# Patient Record
Sex: Male | Born: 1935 | Race: White | Hispanic: No | Marital: Married | Smoking: Former smoker
Health system: Southern US, Community
[De-identification: ages and names within clinical notes are randomized; demographics above are authoritative.]

## PROBLEM LIST (undated history)

## (undated) DIAGNOSIS — S88911A Complete traumatic amputation of right lower leg, level unspecified, initial encounter: Secondary | ICD-10-CM

## (undated) DIAGNOSIS — Z8711 Personal history of peptic ulcer disease: Secondary | ICD-10-CM

## (undated) DIAGNOSIS — I252 Old myocardial infarction: Secondary | ICD-10-CM

## (undated) DIAGNOSIS — I5042 Chronic combined systolic (congestive) and diastolic (congestive) heart failure: Secondary | ICD-10-CM

## (undated) DIAGNOSIS — E785 Hyperlipidemia, unspecified: Secondary | ICD-10-CM

## (undated) DIAGNOSIS — I255 Ischemic cardiomyopathy: Secondary | ICD-10-CM

## (undated) DIAGNOSIS — I2510 Atherosclerotic heart disease of native coronary artery without angina pectoris: Secondary | ICD-10-CM

## (undated) DIAGNOSIS — Z8719 Personal history of other diseases of the digestive system: Secondary | ICD-10-CM

## (undated) DIAGNOSIS — K52831 Collagenous colitis: Secondary | ICD-10-CM

## (undated) HISTORY — PX: STOMACH SURGERY: SHX791

## (undated) HISTORY — PX: CARDIAC CATHETERIZATION: SHX172

## (undated) HISTORY — DX: Collagenous colitis: K52.831

## (undated) HISTORY — PX: BACK SURGERY: SHX140

## (undated) HISTORY — PX: TOTAL KNEE ARTHROPLASTY: SHX125

## (undated) HISTORY — DX: Complete traumatic amputation of right lower leg, level unspecified, initial encounter: S88.911A

## (undated) HISTORY — PX: LEG AMPUTATION: SHX1105

---

## 2004-05-17 ENCOUNTER — Inpatient Hospital Stay (HOSPITAL_COMMUNITY): Admission: EM | Admit: 2004-05-17 | Discharge: 2004-05-24 | Payer: Self-pay | Admitting: Emergency Medicine

## 2004-05-20 ENCOUNTER — Encounter (INDEPENDENT_AMBULATORY_CARE_PROVIDER_SITE_OTHER): Payer: Self-pay | Admitting: *Deleted

## 2004-06-11 ENCOUNTER — Inpatient Hospital Stay (HOSPITAL_COMMUNITY): Admission: RE | Admit: 2004-06-11 | Discharge: 2004-06-15 | Payer: Self-pay | Admitting: Orthopedic Surgery

## 2004-06-12 ENCOUNTER — Encounter (INDEPENDENT_AMBULATORY_CARE_PROVIDER_SITE_OTHER): Payer: Self-pay | Admitting: *Deleted

## 2004-08-14 ENCOUNTER — Encounter: Admission: RE | Admit: 2004-08-14 | Discharge: 2004-10-21 | Payer: Self-pay | Admitting: Orthopedic Surgery

## 2006-09-07 ENCOUNTER — Ambulatory Visit: Payer: Self-pay | Admitting: Gastroenterology

## 2006-09-11 ENCOUNTER — Ambulatory Visit: Payer: Self-pay | Admitting: Gastroenterology

## 2006-09-29 ENCOUNTER — Ambulatory Visit: Payer: Self-pay | Admitting: Gastroenterology

## 2010-01-28 ENCOUNTER — Inpatient Hospital Stay (HOSPITAL_COMMUNITY): Admission: RE | Admit: 2010-01-28 | Discharge: 2010-01-31 | Payer: Self-pay | Admitting: Orthopedic Surgery

## 2010-04-19 NOTE — Procedures (Signed)
Summary: Gastroenterology colon  Gastroenterology colon   Imported By: Donneta Romberg Endo Tech 07/24/2007 13:45:57  _____________________________________________________________________  External Attachment:    Type:   Image     Comment:   External Document

## 2010-05-28 LAB — COMPREHENSIVE METABOLIC PANEL
ALT: 26 U/L (ref 0–53)
AST: 25 U/L (ref 0–37)
Albumin: 3.6 g/dL (ref 3.5–5.2)
Alkaline Phosphatase: 74 U/L (ref 39–117)
BUN: 12 mg/dL (ref 6–23)
CO2: 27 mEq/L (ref 19–32)
Calcium: 9.3 mg/dL (ref 8.4–10.5)
Chloride: 106 mEq/L (ref 96–112)
Creatinine, Ser: 0.99 mg/dL (ref 0.4–1.5)
GFR calc Af Amer: >60 mL/min (ref >60)
GFR calc non Af Amer: >60 mL/min (ref >60)
Glucose, Bld: 78 mg/dL (ref 70–99)
Potassium: 4.6 mEq/L (ref 3.5–5.1)
Sodium: 141 mEq/L (ref 135–145)
Total Bilirubin: 0.4 mg/dL (ref 0.3–1.2)
Total Protein: 6.8 g/dL (ref 6.0–8.3)

## 2010-05-28 LAB — SURGICAL PCR SCREEN
MRSA, PCR: NEGATIVE
Staphylococcus aureus: NEGATIVE

## 2010-05-28 LAB — CBC
HCT: 28.2 % — ABNORMAL LOW (ref 39.0–52.0)
HCT: 28.5 % — ABNORMAL LOW (ref 39.0–52.0)
HCT: 30 % — ABNORMAL LOW (ref 39.0–52.0)
HCT: 41.8 % (ref 39.0–52.0)
Hemoglobin: 10.2 g/dL — ABNORMAL LOW (ref 13.0–17.0)
Hemoglobin: 13.9 g/dL (ref 13.0–17.0)
Hemoglobin: 9.6 g/dL — ABNORMAL LOW (ref 13.0–17.0)
Hemoglobin: 9.7 g/dL — ABNORMAL LOW (ref 13.0–17.0)
MCH: 30.2 pg (ref 26.0–34.0)
MCH: 30.7 pg (ref 26.0–34.0)
MCH: 30.9 pg (ref 26.0–34.0)
MCH: 31 pg (ref 26.0–34.0)
MCHC: 33.4 g/dL (ref 30.0–36.0)
MCHC: 33.7 g/dL (ref 30.0–36.0)
MCHC: 34 g/dL (ref 30.0–36.0)
MCHC: 34.6 g/dL (ref 30.0–36.0)
MCV: 89.6 fL (ref 78.0–100.0)
MCV: 90.6 fL (ref 78.0–100.0)
MCV: 90.7 fL (ref 78.0–100.0)
MCV: 91.1 fL (ref 78.0–100.0)
Platelets: 204 10*3/uL (ref 150–400)
Platelets: 213 10*3/uL (ref 150–400)
Platelets: 231 10*3/uL (ref 150–400)
Platelets: 312 10*3/uL (ref 150–400)
RBC: 3.13 MIL/uL — ABNORMAL LOW (ref 4.22–5.81)
RBC: 3.15 MIL/uL — ABNORMAL LOW (ref 4.22–5.81)
RBC: 3.3 MIL/uL — ABNORMAL LOW (ref 4.22–5.81)
RBC: 4.62 MIL/uL (ref 4.22–5.81)
RDW: 13.9 % (ref 11.5–15.5)
RDW: 14 % (ref 11.5–15.5)
RDW: 14.3 % (ref 11.5–15.5)
RDW: 14.4 % (ref 11.5–15.5)
WBC: 7.7 10*3/uL (ref 4.0–10.5)
WBC: 7.7 10*3/uL (ref 4.0–10.5)
WBC: 8.8 10*3/uL (ref 4.0–10.5)
WBC: 9.1 10*3/uL (ref 4.0–10.5)

## 2010-05-28 LAB — BASIC METABOLIC PANEL
BUN: 6 mg/dL (ref 6–23)
BUN: 9 mg/dL (ref 6–23)
CO2: 28 mEq/L (ref 19–32)
CO2: 29 mEq/L (ref 19–32)
Calcium: 7.9 mg/dL — ABNORMAL LOW (ref 8.4–10.5)
Calcium: 8.2 mg/dL — ABNORMAL LOW (ref 8.4–10.5)
Chloride: 101 mEq/L (ref 96–112)
Chloride: 105 mEq/L (ref 96–112)
Creatinine, Ser: 0.91 mg/dL (ref 0.4–1.5)
Creatinine, Ser: 0.92 mg/dL (ref 0.4–1.5)
GFR calc Af Amer: >60 mL/min (ref >60)
GFR calc Af Amer: >60 mL/min (ref >60)
GFR calc non Af Amer: >60 mL/min (ref >60)
GFR calc non Af Amer: >60 mL/min (ref >60)
Glucose, Bld: 125 mg/dL — ABNORMAL HIGH (ref 70–99)
Glucose, Bld: 131 mg/dL — ABNORMAL HIGH (ref 70–99)
Potassium: 4 mEq/L (ref 3.5–5.1)
Potassium: 4 mEq/L (ref 3.5–5.1)
Sodium: 135 mEq/L (ref 135–145)
Sodium: 137 mEq/L (ref 135–145)

## 2010-05-28 LAB — URINALYSIS, ROUTINE W REFLEX MICROSCOPIC
Bilirubin Urine: NEGATIVE
Glucose, UA: NEGATIVE mg/dL
Hgb urine dipstick: NEGATIVE
Ketones, ur: NEGATIVE mg/dL
Nitrite: NEGATIVE
Protein, ur: NEGATIVE mg/dL
Specific Gravity, Urine: 1.008 (ref 1.005–1.030)
Urobilinogen, UA: 0.2 mg/dL (ref 0.0–1.0)
pH: 6.5 (ref 5.0–8.0)

## 2010-05-28 LAB — PROTIME-INR
INR: 1.02 (ref 0.00–1.49)
INR: 1.19 (ref 0.00–1.49)
INR: 1.5 — ABNORMAL HIGH (ref 0.00–1.49)
INR: 3.46 — ABNORMAL HIGH (ref 0.00–1.49)
Prothrombin Time: 13.6 seconds (ref 11.6–15.2)
Prothrombin Time: 15.3 seconds — ABNORMAL HIGH (ref 11.6–15.2)
Prothrombin Time: 18.3 seconds — ABNORMAL HIGH (ref 11.6–15.2)
Prothrombin Time: 34.8 seconds — ABNORMAL HIGH (ref 11.6–15.2)

## 2010-05-28 LAB — TYPE AND SCREEN
ABO/RH(D): O POS
Antibody Screen: NEGATIVE

## 2010-05-28 LAB — ABO/RH: ABO/RH(D): O POS

## 2010-05-28 LAB — APTT: aPTT: 37 seconds (ref 24–37)

## 2010-07-30 NOTE — Assessment & Plan Note (Signed)
[facility]                         GASTROENTEROLOGY OFFICE NOTE   JENSON, BEEDLE                        MRN:          [id number]  DATE:09/07/2006                            DOB:          08-Feb-1932    RECOMMENDATIONS:  Heme-positive stool.   HISTORY OF PRESENT ILLNESS:  [name] is a pleasant, 77 year old, white  male referred through the courtesy of Dr. Christell Constant for evaluation. Routine  testing demonstrated heme-positive stool. On June 30, 2006, hemoglobin  was 11.5 and MCV was 77. His serum iron was 23 and iron saturation 60%.  [name] has no GI complaints including change of bowel habits,  abdominal pain, melena or hematochezia. He has found no gastric  irritants including nonsteroidals. He is status post subtotal  gastrectomy over 40 years ago for peptic ulcer disease. Colonoscopy and  upper endoscopy in 2004 were unremarkable except for diverticulosis and  hemorrhoids. [name] does give blood regularly. His last contribution  was in December 2007.   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   MEDICATIONS:  Baby aspirin. He is allergic to NAPROSYN.   He does not smoke, he drinks rarely. He is married and retired.   REVIEW OF SYSTEMS:  Reviewed and is negative.   PHYSICAL EXAMINATION:  VITAL SIGNS:  Pulse 80, blood pressure 122/74,  weight 171.  HEENT: EOMI. PERRLA. Sclerae are anicteric.  Conjunctivae are pink.  NECK:  Supple without thyromegaly, adenopathy or carotid bruits.  CHEST:  Clear to auscultation and percussion without adventitious  sounds.  CARDIAC:  Regular rhythm; normal S1 S2.  There are no murmurs, gallops  or rubs.  ABDOMEN:  Bowel sounds are normoactive.  Abdomen is soft, non-tender and  non-distended.  There are no abdominal masses, tenderness, splenic  enlargement or hepatomegaly.  EXTREMITIES:  Full range of motion.  No cyanosis, clubbing or edema.  RECTAL:  Deferred.   IMPRESSION:  Iron deficiency anemia and heme-positive stool. Chronic  gastrointestinal blood loss sources must be ruled out including polyps,  AVMs and neoplasm. He could be malabsorbing iron though this would not  account for his heme positive stool.   RECOMMENDATION:  1. Colonoscopy. If negative, I will proceed with upper endoscopy.  2. To consider capsule endoscopy if the above tests are negative.     Barbette Hair. Arlyce Dice, MD,FACG  Electronically Signed    RDK/MedQ  DD: 09/07/2006  DT: 09/07/2006  Job #: [id number]   cc:   Ernestina Penna, M.D.

## 2010-07-30 NOTE — Letter (Signed)
September 07, 2006    Ernestina Penna, M.D.  [address] [place], [place] [address]   RE:  [name], Eugene  MRN:  [id number]  /  DOB:  04-28-1931   Dear Dr. Christell Constant:   Upon your kind referral, I had the pleasure of evaluating your patient  and I am pleased to offer my findings.  I saw Gregery Walberg in the office  today.  Enclosed is a copy of my progress note that details my findings  and recommendations.   Thank you for the opportunity to participate in your patient's care.    Sincerely,      Barbette Hair. Arlyce Dice, MD,FACG  Electronically Signed    RDK/MedQ  DD: 09/07/2006  DT: 09/07/2006  Job #: [id number]

## 2010-08-02 NOTE — Consult Note (Signed)
NAME:  Eugene, [name]                 ACCOUNT NO.:  [id number]   MEDICAL RECORD NO.:  [id number]          [name] TYPE:  INP   LOCATION:  5041                         FACILITY:  [facility]   PHYSICIAN:  Mobolaji B. Bakare, M.D.DATE OF BIRTH:  1931-11-04   DATE OF CONSULTATION:  05/18/2004  DATE OF DISCHARGE:                                   CONSULTATION   PRIMARY CARE PHYSICIAN:  Dr. Pamalee Leyden [facility]   REASON FOR CONSULTATION:  Desaturation and low pO2 on arterial blood gas.   HISTORY OF PRESENT ILLNESS:  Eugene [name] is a pleasant Caucasian male who had  an unfortunate accident yesterday, whereby he sustained injury to Eugene right  foot, which may eventually require amputation. He was going well until about  6:00 p.m. today. While sleeping, Eugene [name] was snoring and it was  difficult for him to be aroused. Eugene nurse checked his oxygen saturation and  it was in Eugene low 80s. Eventually when they got him aroused, he felt  somewhat short of breath but there was no chest pain. He had some  diaphoresis according to Eugene daughter. No palpitation. There is no cough and  no fever. He was given Narcan 0.1 mg intravenously and thereafter, he had an  episode of vomiting. Vomitus was ingested food. No hematemesis. Apparently,  Eugene [name] had taken a total of 8 mg of morphine in 3 hours. Eugene [name] is  currently awake, saturating at 100% on 2 liters. He denies any chest pain.  Has not had cardiac problems in Eugene past. He is no longer short of breath.   REVIEW OF SYSTEMS:  No fever, no chills. No abdominal pain and no rash.   PAST MEDICAL HISTORY:  1.  Hyperlipidemia for which he is using fish oil  2.  Bleeding duodenal ulcer several years ago for which he had surgery.   CURRENT MEDICATIONS:  Cefazolin 1 gram IV q.8h., Colace 100 mg p.o. b.i.d.,  Lovenox 40 mg subcutaneously q.24h., Pepcid 20 mg b.i.d., morphine sulfate  PCA, IV fluid normal saline at 100 mL per hour, Tylenol p.r.n.,  Benadryl  p.r.n., Robaxin p.r.n., Zofran p.r.n., Percocet p.r.n., Senokot p.r.n.   HOME MEDICATIONS:  Includes 1 aspirin 81 mg q. day, fish oil.   ALLERGIES:  No known drug allergies.   SOCIAL HISTORY:  Eugene [name] occasionally drinks alcohol. He quit smoking  about 25 years ago.   FAMILY HISTORY:  Significant for diabetes mellitus in some of his siblings.  He has got supportive children. Daughter's living in [place].   PHYSICAL EXAMINATION:  VITAL SIGNS:  Temperature 99.7, pulse rate of 107,  blood pressure 112/71, respiratory rate 18. O2 saturation 100% on 2 liters.  GENERAL:  Eugene [name] is not dyspneic.  He appears not pale.  HEENT:  No conjunctival hemorrhage.  Normocephalic, atraumatic head.  No  elevated JVD. No cervical lymphadenopathy.  Ears and oropharynx, moist  mucosa. Hemorrhage on soft palate.  LUNGS:  Clear clinically to auscultation. No wheeze, no rhonchi, no  crackles.  CARDIOVASCULAR:  S1 and S2. Regular tachycardia.  ABDOMEN:  Nondistended, soft. Midline incision scar. No palpable  hepatosplenomegaly. No positive hepatojugular reflux. Bowel sounds present.  EXTREMITIES:  Right lower extremity in a cast. He can wiggle his toes. No  pedal edema.  SKIN:  No petechia. He has bruise on Eugene left forearm from Eugene accident.   LABORATORY DATA:  ABG on room air drawn immediately after Eugene episode, pH of  7.31, pCO2 of 50, pO2 of 52.5, bicarb of 22.4. O2 saturation of 83%. Sodium  132, potassium 4.9, chloride 103, bicarb 28, BUN 6, creatinine 1.1, glucose  205, calcium 7.6. Calcium previously was 8.7 and albumin 3.7. CK 674, CK MB  20.7. Troponin 0.05. CK relative index of 3.1. White cell 9.4. Hemoglobin  and hematocrit 8.9 and 26. Platelets 202,000. Glucose 205. Please note that  previous hemoglobin was 13.6 and hematocrit was 29.3.   Chest x-ray showed moderate right vascular atelectasis and also scarring,  cardiomegaly.   ASSESSMENT/PLAN:  1.  Eugene [name] is a  77 year old Caucasian male with acute shortness of      breath, desaturation, apneic hypoxic respiratory failure pattern on      arterial blood gases. He has elevated troponin and CK MB. This acute      episode is most likely related to poor inspiratory effort on Eugene      background of narcotic analgesic, although ABG is not a typical      pulmonary embolism pattern, given this setting. It would be prudent to      rule out pulmonary embolism/fat embolism, though less likely.  2.  Elevated troponin and CK MB probably from Eugene trauma. Will further cycle      cardiac enzymes.  3.  Eugene [name] is currently saturating at 100% on 2 liters and he is      asymptomatic.   RECOMMENDATIONS:  1.  Give incentive spirometer. Use every 1 to 2 hours when awake.  2.  Obtain CT chest angiogram to rule out pulmonary embolism/fat embolism.  3.  Repeat cardiac panel q. 8 x2.  4.  Check BNP, albumin.  5.  Check CBC in a.m.  6.  Other problems are anemia, hyperglycemia, hyponatremia, hypocalcemia,      right foot injury.  7.  Guaiac stool x2.  8.  Check fasting blood sugar.      MBB/MEDQ  D:  05/18/2004  T:  05/18/2004  Job:  [id number]   cc:   Ernestina Penna, M.D.  [address] [place]  [place] [address]  Fax: [phone]   Vanita Panda. Magnus Ivan, M.D.  Fax: [phone]   Doralee Albino. Carola Frost, M.D.  Fax: [phone]

## 2010-08-02 NOTE — Consult Note (Signed)
NAME:  JEANMARC, VIERNES NO.:  [id number]   MEDICAL RECORD NO.:  [id number]          PATIENT TYPE:  INP   LOCATION:  1827                         FACILITY:  [facility]   PHYSICIAN:  Doralee Albino. Carola Frost, M.D. DATE OF BIRTH:  [birth year]   DATE OF CONSULTATION:  05/17/2004  DATE OF DISCHARGE:                                   CONSULTATION   CONSULTING PHYSICIAN:  Doralee Albino. Carola Frost, M.D.   REQUESTING PHYSICIAN:  Lorre Nick, M.D.   REASON FOR CONSULTATION:  Right foot crush.   BRIEF HISTORY OF PRESENTATION:  The patient is a 77 year old, otherwise  healthy, white male who was cutting down a tree when he fell and crushed his  right foot.  He reports severe pain and loss of plantar sensation.  At the  time of evaluation, he is somewhat somnolent from all the narcotics he has  received.   MEDICATIONS:  None.   ALLERGIES:  None.   PAST MEDICAL HISTORY:  1.  The patient has had back surgery x 2.  2.  He has also had right knee arthroscopic surgery.   REVIEW OF SYSTEMS:  Negative for recent fever, chills, nausea, vomiting, or  chest pain.   SOCIAL HISTORY:  The patient presents with his wife and daughter.   PHYSICAL EXAMINATION:  GENERAL:  He is not in any acute distress.  MUSCULOSKELETAL:  He does not have any ecchymosis, swelling, or tenderness  of the shoulders bilaterally, elbows bilaterally, or the wrists bilaterally.  Examination of the pelvis revealed it to be nontender.  The right knee is  nontender and there is no effusion or abrasions or crepitus.  The right  ankle has been wrapped in a splint.  There is bluish tinting of his toes on  the right side.  Cap refill is greater than 7 seconds.  He does have intact  superficial peroneal nerve sensation.  Deep peroneal is equivocal.  Posterior tibial sensation along the plantar aspect of the foot is absent.  He can gently flex and extend his toes but only weakly.  Dorsalis pedis  pulse is palpable at the ankle or  just below the ankle level.  HEART:  Regular rate and rhythm without murmurs.  LUNGS:  Clear to auscultation bilaterally.  ABDOMEN:  Soft, nontender, nondistended.  EXTREMITIES:  His contralateral left lower extremity is nontender with no  focal ecchymosis, swelling, or crepitus.   X-rays demonstrate multiple fractures involving the right foot with  disruption of the mid foot medially and laterally, a fracture of the first  metatarsal base, a fracture of the third and fourth metatarsal shafts toward  the neck and extensive comminuted calcaneus.  There does appear to be air  within the soft tissues.   ASSESSMENT:  Right foot crush injury with multiple bony injuries and loss of  plantar sensation with associated vascular compromise.   We suspect this injury is not able to be reconstructed and result in very  poor function for the patient.  It may be that Mr. Skalla will most benefit  from a primary amputation but we will  proceed with aggressive irrigation and  debridement to reduce his infection risk while allowing the patient and the  family to grapple with the choice before them.  We will also obtain a CT  scan immediately to better define the nature of the injury.  I did have an  extensive discussion with the patient and the family regarding limb salvage  versus amputation and also discussed with them the plan of surgery in  detail.  After discussion of its risks and benefits, they elected to  proceed.      MHH/MEDQ  D:  05/17/2004  T:  05/18/2004  Job:  [id number]

## 2010-08-02 NOTE — Discharge Summary (Signed)
NAME:  JABER, DUNLOW NO.:  [id number]   MEDICAL RECORD NO.:  [id number]          PATIENT TYPE:  INP   LOCATION:  5016                         FACILITY:  [facility]   PHYSICIAN:  Nadara Mustard, MD     DATE OF BIRTH:  1931/11/15   DATE OF ADMISSION:  06/11/2004  DATE OF DISCHARGE:  06/15/2004                                 DISCHARGE SUMMARY   DIAGNOSIS:  Ischemic necrosis of right foot.   PROCEDURES:  Irrigation and debridement of skin, soft tissue and bone, right  foot.   DISPOSITION:  Discharged to home in stable condition. Plan to followup in  the office in two weeks. Discharged with [facility] to follow up  with home health physical therapy.   HISTORY OF PRESENT ILLNESS:  The patient is a 77 year old gentleman with  ischemic necrosis of the right foot. He has failed conservative treatment in  the office and presents at this time for surgical intervention. The patient  was brought to the OR on June 11, 2004, with ischemic necrosis of the right  foot.   HOSPITAL COURSE:  The patient initially underwent irrigation and debridement  of skin and soft tissue. Despite decompression of the abscess and debridment  of necrotic tissue the patient's foot failed to improve and he was brought  back to the OR for a below knee amputation. He was treated with Kefzol for  infection prophylaxis.  The patient proceeded with below-the-knee amputation  on June 12, 2004. His pain was controlled with PCA pain pump. On  postoperative day #1 from his BKA, the patient was seen by physical therapy  for progressive ambulation, nonweightbearing on the right. He was discharged  to home when safe with physical therapy. He was discharged to home in stable  condition on June 15, 2004, status post right BKA with home health physical  therapy. Prescription for Tylox for pain. Plan to follow up in one week  after discharge.       MVD/MEDQ  D:  08/07/2004  T:  08/07/2004  Job:   [id number]

## 2010-08-02 NOTE — Discharge Summary (Signed)
NAME:  Eugene, [name] NO.:  [id number]   MEDICAL RECORD NO.:  [id number]          PATIENT TYPE:  INP   LOCATION:  5012                         FACILITY:  [facility]   PHYSICIAN:  Nadara Mustard, MD     DATE OF BIRTH:  [birth year]   DATE OF ADMISSION:  05/17/2004  DATE OF DISCHARGE:  05/24/2004                                 DISCHARGE SUMMARY   DIAGNOSIS:  Massive degloving injury; crush to his right foot secondary to a  tree falling on his leg.   POSTOPERATIVE DIAGNOSES:  Massive degloving injury; crush to his right foot  secondary to a tree falling on his leg.   PROCEDURE:  Multiple irrigation debridement.  Percutaneous pin fixation for  the fracture dislocation of the mid foot.   CONDITION:  Discharged to home in stable condition.   FOLLOW-UP:  To follow up in the office one week after discharge.   HISTORY OF PRESENT ILLNESS:  The patient is a 77 year old gentleman who was  cutting down a tree, when the tree fell and crushed his right foot. The  patient had an open fracture dislocation of the right foot, with a complete  degloving of the hind foot heel pad.   HOSPITAL COURSE:  The patient was initially evaluated by Dr. Carola Frost, and  underwent initial urgent irrigation debridement on May 17, 2004.  Postoperatively the patient's foot wound underwent further irrigation  debridement and also underwent placement of a wound vac on May 19, 2004;  with a previous percutaneous pin fixation of the unstable fracture. The  patient's wound progressed well. The heel flap appeared viable and the  patient was scheduled for repeat irrigation debridement.  He was set up for  wound care with [facility] and home health physical therapy, with  anticipated discharge to home.   On May 22, 2004 the dressing was changed; the heel pad was viable. There  was good turgor.  The foot was warm. There was some clear serous drainage  from the fracture blisters. The patient's foot  continued to appear viable  healthy and he was discharged to home on May 24, 2004, status post a  fracture dislocation of the right mid foot with massive degloving injury.   DISPOSITION:  He was given a prescription for Tylox for pain.  Nitroglycerin  patches were used to improve his microcirculatory perfusion.  He was set up  with home health RN for three times a week dressing changes. We will  change the dressing to Xeroform dressing changes.  His Lovenox was  discontinued and he was started on aspirin once a day.   PLAN:  To follow-up in the office in one week.      MVD/MEDQ  D:  07/24/2004  T:  07/24/2004  Job:  [id number]

## 2010-08-02 NOTE — H&P (Signed)
NAME:  Eugene [name], Eugene [name] NO.:  [id number]   MEDICAL RECORD NO.:  [id number]          PATIENT TYPE:  INP   LOCATION:  2550                         FACILITY:  [facility]   PHYSICIAN:  Nadara Mustard, MD     DATE OF BIRTH:  06-19-1931   DATE OF ADMISSION:  06/11/2004  DATE OF DISCHARGE:                                HISTORY & PHYSICAL   HISTORY OF PRESENT ILLNESS:  The patient is a 77 year old gentleman status  post multitrauma to his right foot for which he has undergone percutaneous  pin fixation as well as serial irrigation and debridement and foot salvage.  The patient has ischemic ulceration and presents at this time for  debridement of the foot.   Temperature 98.5, heart rate 102, respiratory rate 16, blood pressure  152/98, height 70 inches, weight 176 pounds.   ALLERGIES:  No known drug allergies.   MEDICATIONS:  Percocet, Advil, aspirin, Colace, Restoril, and Keflex.   PAST MEDICAL HISTORY:  1.  Diskectomy in 1995.  2.  Crushed right foot in early March 2006 with right knee arthroscopy.   REVIEW OF SYSTEMS:  Negative for cardiac problems. He had a cardiac  catheterization on May 20, 2004. PULMONARY: No problems. NEUROLOGIC: The  patient has had a nitroglycerin patch on his foot to try to improve wound  salvage. GI: History of  peptic ulcer disease.   PHYSICAL EXAMINATION:  On examination of the right foot he has a black  eschar which covers the entire plantar aspect of his foot including the  hindfoot and forefoot. Dorsally there is also black eschar. The patient has  good capillary refill in the toes. The foot is stable.   ASSESSMENT:  Ischemic and chronic eschar, right foot.   PLAN:  The patient is admitted at this time for irrigation and debridement  of his foot with potential placement of Integra graft skin plus wound vac.  The risks and benefits of surgery were discussed including infection,  neurovascular injury, persistent wound breakdown, and the  need for potential  amputation. The patient states he understands and wishes to proceed at this  time.      MVD/MEDQ  D:  06/11/2004  T:  06/11/2004  Job:  [id number]

## 2010-08-02 NOTE — Op Note (Signed)
NAME:  Eugene [name], Eugene [name] NO.:  [id number]   MEDICAL RECORD NO.:  [id number]          PATIENT TYPE:  INP   LOCATION:  5041                         FACILITY:  [facility]   PHYSICIAN:  Elana Alm. Thurston Hole, M.D. DATE OF BIRTH:  1931-09-20   DATE OF PROCEDURE:  05/19/2004  DATE OF DISCHARGE:                                 OPERATIVE REPORT   PREOPERATIVE DIAGNOSIS:  Right foot open fracture dislocation with multiple  fractures and soft tissue injury.   POSTOPERATIVE DIAGNOSIS:  Right foot open fracture dislocation with multiple  fractures and soft tissue injury.   PROCEDURE:  Irrigation and debridement of right foot.  Placement of wound  VAC to right foot.   SURGEON:  Elana Alm. Thurston Hole, M.D.   ASSISTANT:  Nadara Mustard, M.D. and Cecil Cranker, PA   ANESTHESIA:  General.   OPERATIVE TIME:  45 minutes.   COMPLICATIONS:  None.   DESCRIPTION OF PROCEDURE:  [name] was brought to the operating room on  May 19, 2004.  Prior to surgery, he was found to have slight ST depressions  on his EKG and a slightly elevated troponin of 0.05.  I spoke to Meade Maw, M.D. of cardiology prior to surgery who recommended that we proceed  to surgery and that she would see [name] postoperatively and decide what  if any cardiac workup would be necessary with no significant cardiac  history.   Mr. Bills was brought to the operating room and placed on the operating  table in the supine position.  After an adequate level of general anesthesia  was obtained, his dressings were removed from his right foot.  There was  found to be satisfactory capillary refill to his toes.  There was a palpable  dorsalis pedis pulse and a very strong Doppler posterior tibial pulse.  The  pins which had been previously placed two days ago by Doralee Albino. Handy, M.D.  were in satisfactory position.  Right leg was then prepped using Betadine  and draped using sterile technique.  I took down four of the  previous  tacking sutures and elevated part of the flap for irrigation purposes.  I  did not take down the whole flap in order to not devascularize the flap.  There was slight amount of superficial necrotic tissue which I debrided, but  no gross contamination was noted.  This wound was then irrigated with saline  and then I resutured loosely the flap edges with 2-0 nylon suture.  After  this was done, then a wound VAC was placed on the wound and  sterile dressings were applied and the patient then had a femoral nerve  block placed by anesthesia for postoperative pain control.  He was then  awakened, extubated, and taken to the recovery room in stable condition.  Needle, sponge, and instrument counts correct x2 at the end of the case.      Robert A. Thurston Hole, M.D.  Electronically Signed     RAW/MEDQ  D:  05/19/2004  T:  05/20/2004  Job:  [id number]

## 2010-08-02 NOTE — Consult Note (Signed)
NAME:  Eugene [name], Eugene [name] NO.:  [id number]   MEDICAL RECORD NO.:  [id number]          PATIENT TYPE:  INP   LOCATION:  5041                         FACILITY:  [facility]   PHYSICIAN:  Meade Maw, M.D.    DATE OF BIRTH:  December 15, 1931   DATE OF CONSULTATION:  DATE OF DISCHARGE:                                   CONSULTATION   CONTINUATION:   PHYSICAL EXAMINATION:  GENERAL:  Physical exam reveals an elderly male in no  acute distress.  He is recently status post irrigation.  VITAL SIGNS:  Blood pressure is 146/64, heart rate is 104, O2 SAT is 95% on  2 L.  HEENT:  Unremarkable.  NECK:  There is no neck vein distention noted.  There are no carotid bruits  noted.  PULMONARY:  Exam reveals breath sounds which are equal and clear to  auscultation, no use of accessory muscles.  CARDIOVASCULAR:  Exam reveals a normal S1 and S2, regular rate and rhythm,  no audible murmurs are noted.  PMI is not displaced.  ABDOMEN:  Abdomen is soft, benign, nontender.  No unusual bruits or  pulsations are noted.  Midline incision scar is noted.  EXTREMITIES:  The extremities reveal the right leg in a cast.  There is no  peripheral edema noted.  SKIN:  Warm and dry.  NEUROLOGIC:  Nonfocal.   LABORATORY DATA:  Serial CKs as noted above.  White count is 7.7, hemoglobin  is 8.5, hematocrit is 24.6, admission hematocrit of 39.3, platelet count at  197,000.  Sodium is 135, potassium is 4.2, chloride is 103, CO2 is 27, BUN  is 0.4, his creatinine is 1.0, albumin is 2.7, calcium 7.8.   ECG on May 17, 2004 revealed a sinus rhythm, normal ECG, no ST changes.  ECG from May 15, 2004 revealed sinus tachycardia with nonspecific ST  changes.  ECG today reveals a sinus rhythm with no significant ECG changes.   CT scan of the chest was performed for further evaluation of pulmonary  embolus; there was no pulmonary embolus noted.  The patient was noted to  have bilateral small pleural effusions.   IMPRESSION:  1.  Positive cardiac enzymes.  There has been only a marginal elevation in      his relative index and this may be more related to the crush injury of      his right foot.  His troponin I is marginally elevated as well; however,      in view of the hypoxia, nonspecific ST changes and marginal elevation in      enzymes, it is prudent to proceed with left heart catheterization for      further evaluation.  This has been discussed with Dr. Thurston Hole.  He feels      there is no contraindication to anticoagulation.  We will also initiate      aspirin and heparin per cardiology protocol.  2.  Dyslipidemia.  We will obtain a fasting lipid profile for further      evaluation.  3.  Hypoxia most likely related to his morphine.  He has had no recurrence.      We will continue to monitor at this time.   Thank you for allowing me to participate in the care of your patient.  I  will discuss the patient further with you.      HP/MEDQ  D:  05/19/2004  T:  05/20/2004  Job:  [id number]   cc:   Molly Maduro A. Thurston Hole, M.D.  [address]  [place] [address]  Fax: [phone]   Doralee Albino. Carola Frost, M.D.  Fax: [phone]

## 2010-08-02 NOTE — Cardiovascular Report (Signed)
NAME:  Eugene [name], Eugene [name] NO.:  [id number]   MEDICAL RECORD NO.:  [id number]          PATIENT TYPE:  INP   LOCATION:  2907                         FACILITY:  [facility]   PHYSICIAN:  Meade Maw, M.D.    DATE OF BIRTH:  10-21-1931   DATE OF PROCEDURE:  DATE OF DISCHARGE:                              CARDIAC CATHETERIZATION   INDICATIONS FOR PROCEDURE:  Positive cardiac enzymes, dyspnea, nonspecific  ST changes noted on ECG.   PROCEDURE:  After obtaining written informed consent the patient was brought  to the cardiac catheterization lab in a post absorptive state.  Preop  sedation was achieved using Versed 1 mg IV.  The left groin was prepped and  draped in the usual sterile fashion, local anesthesia was achieved using 1%  Xylocaine.  A 6 French hemostasis sheath was placed into the left femoral  artery using a modified Seldinger technique.  The left femoral artery was  elected in view of the patients crush injury to his right foot.  Selective  coronary angiography was performed using the JL4, JR4 Judkins catheter,  multiple views were obtained, all catheters were exchanged with made over  guidewire.  A single plain ventriculogram was performed in the RAO position  using a 6 French pigtail curved catheter . There was no identifiable  coronary artery disease.  The patient was transferred to the holding area.  Hemostasis sheath was removed.  Hemostasis was achieved using a FemoStop  device and there was no immediate complications.   FINDING:  The aortic pressure was 128/67, LV pressure was 120/11 with an EDP  of 13.  Single plain ventriculogram revealed normal wall motion ejection  fraction 65-70%.   Coronary angiography.  The left main coronary artery bifurcates into the  left anterior descending and circumflex vessel.  There was no significant  disease noted in the left main coronary artery.   Left anterior descending:  The left anterior descending gives rise to a  small to moderate D1, large D2, goes on in as an apical branch.  There is  tortuosity of the mid vessel with a branch point netted as well.  This was  reviewed in several procedures.  This was not felt to be related to coronary  artery disease.   Circumflex:  The circumflex vessel gives rise to a small OM1, moderate OM2  and a large OM3.  There was no disease noted in the circumflex or its  branches.   Right coronary artery:  The right coronary artery is a large dominant artery  giving rise to two RV marginal PDA.  There is no disease noted in the right  coronary artery or its branches.   FINAL IMPRESSION:  1.  Normal coronary angiography.  2.  Normal single plain ventriculogram.  3.  Elevation in CK-MB is related to crush injury.  No further cardiac      workup is indicated at this time.      HP/MEDQ  D:  05/20/2004  T:  05/20/2004  Job:  [id number]

## 2010-08-02 NOTE — Op Note (Signed)
NAME:  Eugene [name], Eugene [name] NO.:  [id number]   MEDICAL RECORD NO.:  [id number]          PATIENT TYPE:  INP   LOCATION:  5016                         FACILITY:  [facility]   PHYSICIAN:  Nadara Mustard, MD     DATE OF BIRTH:  1931-10-23   DATE OF PROCEDURE:  06/12/2004  DATE OF DISCHARGE:                                 OPERATIVE REPORT   PREOP DIAGNOSIS:  Ischemic necrotic right foot.   POSTOP DIAGNOSIS:  Ischemic necrotic right foot.   PROCEDURE:  Right below-the-knee amputation.   SURGEON:  Nadara Mustard, M.D.   ANESTHESIA:  General.   ESTIMATED BLOOD LOSS:  Minimal.   ANTIBIOTICS:  1 gram of Kefzol preoperatively.   TOURNIQUET TIME:  9 minutes at 300 mmHg.   DISPOSITION:  To PACU in stable condition.   INDICATIONS FOR PROCEDURE:  The patient is a 77 year old gentleman status  post trauma to his right foot. The patient has undergone multiple surgical  procedures for foot salvage, however he has had progressive ischemic  necrosis of the plantar aspect of the foot and presents at this time for  right below-the-knee amputation. The risks and benefits of surgery were  discussed including infection, neurovascular injury, nonhealing wound, need  for higher-level amputation. The patient and family state they understand  and wish to proceed at this time.   DESCRIPTION OF PROCEDURE:  The patient was brought to OR room 4 an underwent  general anesthetic. After adequate level of anesthesia obtained, the  patient's right lower extremity was prepped using DuraPrep and draped in a  sterile field and the right foot was draped into a impervious stockinette.  The leg was elevated. The tourniquet inflated to 300 mmHg at the thigh. A  transverse incision was made 10 cm distal to the tibial tubercle. This  curved proximally and a large posterior flap was created. The tibia was  beveled and transected 1 cm proximal to the skin and the fibula was  transected 1 cm proximal to the  tibia. A large posterior flap was created.  The sciatic nerve was pulled, cut and allowed to retract. The vascular  bundles were suture ligated x3 each. The tourniquet was deflated  after 9 minutes. The deep and superficial fascial layers were closed using  #1 PDS. The skin was closed using 2-0 Vicryl. Skin was closed using  approximating staples. Wound was covered with Adaptic, orthopedic sponges,  ABD dressing, Webril, Kerlix and Coban. The patient was extubated, taken to  PACU in stable condition.      MVD/MEDQ  D:  06/12/2004  T:  06/13/2004  Job:  [id number]

## 2010-08-02 NOTE — Consult Note (Signed)
NAME:  KEJON, FEILD NO.:  [id number]   MEDICAL RECORD NO.:  [id number]          PATIENT TYPE:  INP   LOCATION:  5041                         FACILITY:  [facility]   PHYSICIAN:  Meade Maw, M.D.    DATE OF BIRTH:  1931-06-21   DATE OF CONSULTATION:  DATE OF DISCHARGE:                                   CONSULTATION   REFERRING PHYSICIAN:  Dr. Elana Alm. Wainer.   INDICATION FOR CONSULT:  Positive cardiac enzymes, new ECG changes.   HISTORY:  Eugene [name] is a very pleasant 77 year old gentleman who was  involved in an accident in which he sustained injury to the right foot on  May 17, 2004.  It is felt that the foot may be unsalvageable, but it has  been elected to proceed with aggressive irrigation and debridement.  On  May 18, 2004, the patient was found to be asleep with labored breathing.  The daughter was at his bedside.  The patient was noted to have O2  desaturations with O2 SATS in the 80%; this was felt to be secondary to the  morphine he was given prior and the patient was given Narcan 0.1 mg  intravenously and subsequently had emesis.  ECG was performed; this revealed  a sinus tachycardia with nonspecific ST changes.  Serial cardiac enzymes  were obtained; his initial CK was 674 with an MB of 20.7; his relative index  was marginally elevated at 3.1; his troponin I was 0.05; his BNP was 78.  His second set of enzymes revealed a CK of 607 with an MB of 17.2 with a  relative index of 2.8.  Damoney has had no chest pain.  He has had no other  episodes of dyspnea.  He was very active prior to this presentation.  He  states that he walked on a full Bruce protocol, walked for a total of 9  minutes, there was no inducible ischemia noted.  He has had no past medical  history of orthopnea, pedal edema, tachyarrhythmia.  His coronary risk  factors are significant for male sex, age, __________ significant family  history, brothers with myocardial infarctions in their  later years; he has  also been treated for dyslipidemia.   PAST MEDICAL HISTORY:  Past medical history significant for:  1.  Duodenal ulcer requiring surgery several years ago.  2.  Hyperlipidemia.   CURRENT MEDICATIONS:  1.  Pepcid 10 mg p.o. b.i.d.  2.  Lovenox 40 mg subcu q.24 h.  3.  Morphine p.r.n.  4.  Ancef.  5.  Kefzol IV q.8 h.  6.  Zofran q.6 h. p.r.n.  7.  Percocet p.r.n.  8.  Tylenol p.r.n.  9.  Robaxin p.r.n.  10. Benadryl p.r.n.  11. Reglan p.r.n.   ALLERGIES:  He has had no known drug allergies.   SOCIAL HISTORY:  The patient is married, stopped smoking 25 years ago,  occasional alcohol.   FAMILY HISTORY:  Family history is significant for diabetes mellitus.   PHYSICAL EXAMINATION:  GENERAL:  Physical exam reveals an elderly male in no  acute  distress, recently status post irrigation of his right foot.   DICTATION ENDED AT THIS POINT.      HP/MEDQ  D:  05/19/2004  T:  05/20/2004  Job:  [id number]   cc:   Molly Maduro A. Thurston Hole, M.D.  [address]  [place] [address]  Fax: [phone]   Doralee Albino. Carola Frost, M.D.  Fax: [phone]   Vanita Panda. Magnus Ivan, M.D.  Fax: [phone]

## 2010-08-02 NOTE — Op Note (Signed)
NAME:  Eugene [name], Eugene [name] NO.:  [id number]   MEDICAL RECORD NO.:  [id number]          PATIENT TYPE:  INP   LOCATION:  5016                         FACILITY:  [facility]   PHYSICIAN:  Nadara Mustard, MD     DATE OF BIRTH:  26-Oct-1931   DATE OF PROCEDURE:  06/11/2004  DATE OF DISCHARGE:                                 OPERATIVE REPORT   PREOPERATIVE DIAGNOSIS:  Ischemic necrotic right foot.   POSTOPERATIVE DIAGNOSIS:  Ischemic necrotic right foot.   PROCEDURE:  Irrigation debridement skin, soft tissue and bone.   SURGEON:  Nadara Mustard, MD   ANESTHESIA:  General LMA.   ESTIMATED BLOOD LOSS:  Minimal.   ANTIBIOTICS:  1 gram of Kefzol.   TOURNIQUET TIME:  None.   DRAINS:  None.   COMPLICATIONS:  None.   DISPOSITION:  To PACU in stable condition.   INDICATIONS FOR PROCEDURE:  The patient is a 77 year old gentleman who is  status post trauma to his right foot for which he sustained a fracture  dislocation open across the mid foot from a tree falling on his foot. The  patient underwent serial irrigation debridement as well as percutaneous pin  fixation and presents at this time for irrigation and debridement of the  necrotic skin with a potential application of Apligraf and a wound vac.  Risks and benefits were discussed including infection, neurovascular injury,  nonviable foot, need for amputation. The patient states he understands and  wished proceed at this time.   DESCRIPTION OF PROCEDURE:  The patient was brought to OR room 4 an underwent  a general LMA anesthetic. After adequate level of anesthesia obtained, the  patient's right lower extremity was prepped using DuraPrep and draped in a  sterile field. The dorsal aspect of the foot had a very thin black eschar  layer which was debrided. There was good viable bleeding tissue with beefy  granulation tissue. The plantar aspect had extensive necrosis, necrosis of  the heel pad with necrosis of the calcaneus  as well as necrosis of the bones  of the mid foot. The patient had extensive necrosis of the plantar muscles.  These were debrided back to bleeding viable tissue and there was still  necrotic bone and an unstable mid foot. The patient had an essentially  nonsalvageable foot and it was after  cleansing and debriding, there was no necrotic tissue and the wound was  dressed with 4x4, ABDs, Kerlix and Coban. Anticipate we will need to bring  the patient back for a below-the-knee amputation. The patient was extubated,  taken to PACU in stable condition.      MVD/MEDQ  D:  06/11/2004  T:  06/12/2004  Job:  [id number]

## 2010-08-02 NOTE — Op Note (Signed)
NAME:  Eugene [name], Eugene [name] NO.:  [id number]   MEDICAL RECORD NO.:  [id number]          PATIENT TYPE:  INP   LOCATION:  1827                         FACILITY:  [facility]   PHYSICIAN:  Doralee Albino. Carola Frost, M.D. DATE OF BIRTH:  Apr 22, 1931   DATE OF PROCEDURE:  05/17/2004  DATE OF DISCHARGE:                                 OPERATIVE REPORT   PREOPERATIVE DIAGNOSIS:  Right foot crush and near amputation.   POSTOPERATIVE DIAGNOSIS:  Right foot crush and near amputation.   OPERATIVE PROCEDURE:  1.  Irrigation and debridement of multiple right foot fractures including      skin, muscle, fascia, and bone  2.  Open treatment and pinning of talotarsal joint fracture dislocations  3.  Open treatment and pinning of tarsometatarsal joint/ Lisfranc fracture      dislocations   SURGEON:  Doralee Albino. Carola Frost, M.D.   ASSISTANT:  Cecil Cranker, P.A.   ANESTHESIA:  General anesthesia.   FINDINGS:  1.  Severely comminuted open fracture dislocation of the Lisfranc joint  2.  Comminuted open fracture dislocation of the naviculocuneiform joints.  3.  Open severely comminuted calcaneus fracture  4.  Heel pad avulsion.  5.  3rd and 4th metatarsal shaft fractures  6.  Disruption of the posterior tibial, anterior tibial and extensor      hallucis longus tendons.  7.  Disruption of the dorsalis pedis artery and deep peroneal nerve at the      level of the Lisfranc joint.   COMPLICATIONS:  None.   ESTIMATED BLOOD LOSS:  40 mL, but probably another 150 was on his dressings  which was removed in the operating room.   DISPOSITION:  To PACU.   CONDITION:  Stable.   INDICATIONS FOR PROCEDURE:  The patient is a 77 year old white male,  otherwise healthy retiree, who was cutting down a tree when it fell on his  foot and crushed it.  He was subsequently evaluated for further treatment in  the [facility] ER.  Plain films and CAT scan revealed extensive bone injury  and ligamentous injury.   CT scan also demonstrated extensive air within the  soft tissues up to the level of the talus and throughout the mid- and  forefoot.  After discussion of the risks and benefits of surgery, the  patient and his family wished to proceed with irrigation and debridement.   After obtaining the CT scan and prior to surgery, I had another discussion  with them regarding the extent of his injuries and the expected need for  amputation given the complexity of the issues confronting him, including the  loss of plantar sensation, the decreased vascularity to his forefoot, the  extensively comminuted and open fracture dislocation of the midfoot and the  extensively comminuted calcaneus fracture with an avulsed heel pad.  Given  those factors, he would be best served by amputation in order to regain  function more quickly and to have more functional use of that extremity.  However, I thought that it was best to proceed with irrigation and  debridement until the  patient and his family could deal with the emotional  and psychological impact of amputation in addition to the physical problem  confronting them.  I also shared with them multiple pictures obtained from  the operating room and again, appealed to their sense of their husband's and  father's desires with regard to limb salvage versus primary amputation.  As  there was clearly no consensus, again I recommended that we proceed with  irrigation and debridement and perform a delayed amputation when and if  necessary.  The family understood the risks of delay to include possible  infection as well as pain and increased hospital stay and secondary  procedures.   DESCRIPTION OF PROCEDURE:  [name] underwent a thorough prep and drape of  his right lower extremity after obtaining the pictures described above.  There was extensive soft tissue injury, and his entire hindfoot and midfoot  and forefoot had the palpable sensation of a bag of bones.  His  heel pad  could be displaced entirely to the lateral side of his foot, exposing his  entire calcaneus, which again was extensively comminuted.  All of the free  fragments were removed.  The medial aspect of the navicular was completely  avulsed.  There was some dirt contamination but in general, the wound  appeared surprisingly clean of gross contaminants.  The anterior tib and  posterior tib disruptions were identified.  This avascular tissue was  trimmed and the completely dislocated intermediate cuneiform was also  examined and irrigated.  Two 3-L bags of pulsatile saline were then used to  irrigate his wound in addition to curets for the bony surfaces.  Two 62 K-  wires were then placed through the medial and lateral columns to reduce and  hold the midfoot on the medial and lateral aspects.  No fixation was placed  into the heel pad.  A deep drain was placed and then the skin closed with  far-near-near-far sutures.  A fluffy dressing was applied and then a  posterior splint.  During the case, the patient's toes, which initially were  quite blue, appeared to pink and then turned more ashen toward the  completion of the case again.  There was no pulse palpable in the posterior  tibial artery, but again the posterior tibial artery and nerve did appear to  be in continuity.  The dorsalis pedis artery was avulsed at the level of the  tarsal metatarsal joint.   PROGNOSIS:  Mr. Rabalais's prognosis with regard to this foot is grave.  It has  undergone multiple injuries which in and of themselves are relative  indications for amputation, including the extensively comminuted hindfoot  fracture with associated soft tissue injury, the avulsed heel pad, the loss  of plantar sensation and the open extensively comminuted fracture  dislocation of the midfoot.  Given the combination of these factors, this foot will not be very functional, and the patient would probably best be  served by an amputation.   We will try to contact the prosthetic group to see  if they would have an amputee discuss the course of this procedure and  recovery with the patient to better inform him to make his own decision  regarding amputation versus limb salvage.  Should he develop any signs or  symptoms of sepsis, we will proceed immediately with amputation.  He will  remain on perioperative antibiotics as well as DVT prophylaxis.  I discussed  all of these findings with Dr. Thurston Hole, who also evaluated  the patient's foot  intraoperatively as well as Dr. Magnus Ivan, who evaluated and examined the  patient preoperatively.  Please see their notes for their opinions regarding  Mr. Mcallister's injury.   Plan will be to return him to the OR in two days for repeat irrigation and  debridement at the very least.      MHH/MEDQ  D:  05/17/2004  T:  05/19/2004  Job:  [id number]

## 2011-10-16 ENCOUNTER — Ambulatory Visit: Payer: Medicare Other | Attending: Family Medicine | Admitting: Physical Therapy

## 2011-10-16 DIAGNOSIS — IMO0001 Reserved for inherently not codable concepts without codable children: Secondary | ICD-10-CM | POA: Insufficient documentation

## 2011-10-16 DIAGNOSIS — R269 Unspecified abnormalities of gait and mobility: Secondary | ICD-10-CM | POA: Insufficient documentation

## 2011-10-16 DIAGNOSIS — R5381 Other malaise: Secondary | ICD-10-CM | POA: Insufficient documentation

## 2011-10-16 DIAGNOSIS — S88119A Complete traumatic amputation at level between knee and ankle, unspecified lower leg, initial encounter: Secondary | ICD-10-CM | POA: Insufficient documentation

## 2012-04-04 ENCOUNTER — Emergency Department (HOSPITAL_COMMUNITY): Payer: Medicare Other

## 2012-04-04 ENCOUNTER — Encounter (HOSPITAL_COMMUNITY): Payer: Self-pay

## 2012-04-04 ENCOUNTER — Emergency Department (HOSPITAL_COMMUNITY)
Admission: EM | Admit: 2012-04-04 | Discharge: 2012-04-04 | Disposition: A | Discharge status: Home / Self Care | Payer: Medicare Other | Attending: Emergency Medicine | Admitting: Emergency Medicine

## 2012-04-04 DIAGNOSIS — M19039 Primary osteoarthritis, unspecified wrist: Secondary | ICD-10-CM | POA: Insufficient documentation

## 2012-04-04 DIAGNOSIS — Y9289 Other specified places as the place of occurrence of the external cause: Secondary | ICD-10-CM | POA: Insufficient documentation

## 2012-04-04 DIAGNOSIS — Z8719 Personal history of other diseases of the digestive system: Secondary | ICD-10-CM | POA: Insufficient documentation

## 2012-04-04 DIAGNOSIS — M19031 Primary osteoarthritis, right wrist: Secondary | ICD-10-CM

## 2012-04-04 DIAGNOSIS — W108XXA Fall (on) (from) other stairs and steps, initial encounter: Secondary | ICD-10-CM | POA: Insufficient documentation

## 2012-04-04 DIAGNOSIS — Y9301 Activity, walking, marching and hiking: Secondary | ICD-10-CM | POA: Insufficient documentation

## 2012-04-04 DIAGNOSIS — S63509A Unspecified sprain of unspecified wrist, initial encounter: Secondary | ICD-10-CM

## 2012-04-04 HISTORY — DX: Personal history of other diseases of the digestive system: Z87.19

## 2012-04-04 HISTORY — DX: Personal history of peptic ulcer disease: Z87.11

## 2012-04-04 MED ORDER — TRAMADOL HCL 50 MG PO TABS: 50.0000 mg | ORAL_TABLET | Freq: Four times a day (QID) | ORAL | Status: DC | PRN

## 2012-04-04 NOTE — ED Notes (Signed)
Pt alert and oriented x4. Respirations even and unlabored, bilateral symmetrical rise and fall of chest. Skin warm and dry. In no acute distress. Denies needs.   

## 2012-04-04 NOTE — ED Provider Notes (Signed)
History     CSN: [id number]  Arrival date & time 04/04/12  4098   First MD Initiated Contact with Patient 04/04/12 [phone]      Chief Complaint  Patient presents with  . Fall  . Hand Injury    (Consider location/radiation/quality/duration/timing/severity/associated sxs/prior treatment) HPI Comments: Eugene [name] is a 79 y.o. Male who injured his right wrist yesterday. He had been walking down steps carrying a piece of wood, and took a misstep, which caused him to fall. He feels that is secondary to his gait disorder, being an amputee with a prosthesis. He has isolated injury to the right wrist. No headache, neck pain, back pain. He has been using Advil with only partial relief of pain in his right wrist. The pain is exacerbated by movement, and palpation. There are no other modifying factors  Patient is a 79 y.o. male presenting with fall and hand injury. The history is provided by the patient.  Fall  Hand Injury     Past Medical History  Diagnosis Date  . History of stomach ulcers     Past Surgical History  Procedure Date  . Total knee arthroplasty   . Leg amputation   . Back surgery   . Stomach surgery     History reviewed. No pertinent family history.  History  Substance Use Topics  . Smoking status: Never Smoker   . Smokeless tobacco: Never Used  . Alcohol Use: Yes     Comment: occasional      Review of Systems  All other systems reviewed and are negative.    Allergies  Review of patient's allergies indicates not on file.  Home Medications   Current Outpatient Rx  Name  Route  Sig  Dispense  Refill  . TRAMADOL HCL 50 MG PO TABS   Oral   Take 1 tablet (50 mg total) by mouth every 6 (six) hours as needed for pain.   30 tablet   0     BP 148/75  Pulse 99  Temp 98 F (36.7 C)  Resp 18  SpO2 99%  Physical Exam  Nursing note and vitals reviewed. Constitutional: He is oriented to person, place, and time. He appears well-developed and  well-nourished. No distress.  HENT:  Head: Normocephalic and atraumatic.  Right Ear: External ear normal.  Left Ear: External ear normal.  Eyes: Conjunctivae normal and EOM are normal. Pupils are equal, round, and reactive to light.  Neck: Normal range of motion and phonation normal. Neck supple.  Cardiovascular: Normal rate and intact distal pulses.   Pulmonary/Chest: Effort normal. He exhibits no bony tenderness.  Abdominal: Normal appearance.  Musculoskeletal:       Left wrist, slightly swollen and tender. Decreased active range of motion right wrist, secondary to pain. Neurovascular intact distally in the right fingers. Normal range of motion, right hand and fingers  Neurological: He is alert and oriented to person, place, and time. He has normal strength. No cranial nerve deficit or sensory deficit. He exhibits normal muscle tone. Coordination normal.  Skin: Skin is warm, dry and intact.  Psychiatric: He has a normal mood and affect. His behavior is normal. Judgment and thought content normal.    ED Course  Procedures (including critical care time)  Labs Reviewed - No data to display Dg Wrist Complete Right  04/04/2012  *RADIOLOGY REPORT*  Clinical Data: Fall last night with lateral wrist pain and swelling.  RIGHT WRIST - COMPLETE 3+ VIEW  Comparison: None.  Findings:  Mild osteopenia.  Degenerative irregularity at the base the first metacarpal.  Calcification in the triangular fibrocartilage.  Scaphoid intact.  IMPRESSION: Degenerative change, without acute osseous finding.  Calcification in the triangular fibrocartilage, suggesting calcium pyrophosphate deposition disease.   Original Report Authenticated By: Jeronimo Greaves, M.D.    Nursing notes, applicable records and vitals reviewed.  Radiologic Images/Reports reviewed.   1. Wrist sprain   2. Degenerative joint disease of right wrist       MDM  Mechanical fall, with isolated right wrist injury. Incidental, arthritis noted in  right wrist. Treated and improved in ED. He is stable for discharge     Plan: Home Medications-  tramadol; Home Treatments- wrist splint, for comfort elevate; Recommended follow up- PCP in one week prn     Flint Melter, MD 04/04/12 1114

## 2012-04-04 NOTE — ED Notes (Signed)
Patient repor.ts that he was going up steps and slipped on the step landing on his right hand and right buttocks. Right hand slightly edematous.

## 2012-04-04 NOTE — ED Notes (Signed)
Pt escorted to discharge window. Pt verbalized understanding discharge instructions. In no acute distress.  

## 2012-06-03 ENCOUNTER — Ambulatory Visit (INDEPENDENT_AMBULATORY_CARE_PROVIDER_SITE_OTHER): Payer: Medicare Other | Admitting: Family Medicine

## 2012-06-03 ENCOUNTER — Telehealth: Payer: Self-pay | Admitting: Family Medicine

## 2012-06-03 ENCOUNTER — Encounter: Payer: Self-pay | Admitting: Family Medicine

## 2012-06-03 VITALS — BP 110/80 | HR 145 | Temp 97.6°F | Wt 165.0 lb

## 2012-06-03 DIAGNOSIS — R000 Tachycardia, unspecified: Secondary | ICD-10-CM

## 2012-06-03 DIAGNOSIS — R197 Diarrhea, unspecified: Secondary | ICD-10-CM

## 2012-06-03 DIAGNOSIS — I498 Other specified cardiac arrhythmias: Secondary | ICD-10-CM

## 2012-06-03 DIAGNOSIS — F411 Generalized anxiety disorder: Secondary | ICD-10-CM

## 2012-06-03 MED ORDER — ALPRAZOLAM 0.5 MG PO TABS: 0.5000 mg | ORAL_TABLET | Freq: Three times a day (TID) | ORAL | Status: DC | PRN

## 2012-06-03 MED ORDER — METOPROLOL SUCCINATE ER 25 MG PO TB24: 25.0000 mg | ORAL_TABLET | Freq: Every day | ORAL | Status: DC

## 2012-06-03 NOTE — Telephone Encounter (Signed)
Called patient and told him to walk in.

## 2012-06-03 NOTE — Telephone Encounter (Signed)
PT C/O SEVERE DIARRHEA, ANXIOUS, BP UP WHEN AT DENTIST TODAY 160/120. INSISTS ON SEEING YOU TODAY. CALL SOMETHING IN

## 2012-06-03 NOTE — Progress Notes (Signed)
Subjective:     Patient ID: Eugene [name], male   DOB: [birth year], 79 y.o.   MRN: [id number]  HPI Patient is a very pleasant 79 year old man who I asked to come in after talking with him on the telephone this afternoon.  He developed diarrhea yesterday.  The diarrhea persisted into this morning even after taking Imodium.  He went to the dentist today for his teeth cleaning and was found to have a blood pressure ranging from 150-160/100-110.  This made him very nervous and very upset and was to come in today to discuss.  He has never had hypertension.  He denies any palpitations, presyncope, or feeling his heart racing.  On arrival today he is found to be tachycardic at 145 in sinus rhythm.  He denies any chest pain or shortness of breath.  However he is visibly anxious and actually tremulous in the room as he talks.    Apparently his wife when I found this morning with with their children.  He apparently cut a tree in their yard.  This had him very upset because he found out the tree was actually viable he was cutting it because he thought was rotten.  He is very upset about losing the tree in his yard.  I think this coupled with diarrhea and coupled with the elevated blood pressure at the dentist triggered a mild anxiety attack.  He states he feels very nervous.   Review of Systems  Constitutional: Negative.   HENT: Negative for ear pain, congestion, facial swelling, rhinorrhea, neck pain, postnasal drip and tinnitus.   Eyes: Negative.   Respiratory: Negative for apnea, cough, choking, chest tightness, shortness of breath, wheezing and stridor.   Cardiovascular: Negative for chest pain and palpitations.  Gastrointestinal: Positive for diarrhea. Negative for nausea, vomiting, abdominal pain, constipation and abdominal distention.  Psychiatric/Behavioral: Positive for agitation.       Objective:   Physical Exam  Constitutional: He appears distressed.  HENT:  Head: Normocephalic and atraumatic.   Right Ear: External ear normal.  Left Ear: External ear normal.  Eyes: Conjunctivae and EOM are normal. Pupils are equal, round, and reactive to light.  Neck: Normal range of motion. Neck supple. No mass and no thyromegaly present.  Cardiovascular: Regular rhythm and normal heart sounds.  Tachycardia present.   Pulmonary/Chest: Effort normal. No accessory muscle usage. Not tachypneic. No respiratory distress.  Abdominal: Soft. Normal appearance and bowel sounds are normal. There is no hepatosplenomegaly. There is no tenderness.   patient is very scared and upset.  His hands are shaking as he speaks to me.  His mucous membranes are moist and there is no evidence of dehydration.    EKG shows sinus tachycardia at 143 beats per minute is a slight left axis of -10 there is no signs of ischemia or infarction.   After talking to me for approximately 25 minutes his heart rate has slowed down to 110 beats per minute. Assessment:     Diarrhea-viral gastroenteritis Sinus tachycardia Anxiety attack    Plan:     Having then the patient for years I really feel like he is having an anxiety attack.  I told him to use Imodium for the diarrhea as directed on the bottle.  I told him to be a brat diet and to push fluids.  On my wanting to go home and rest.  Takes Xanax 0.5 mg one by mouth every 8 hours when necessary anxiety and Toprol-XL 25 mg by mouth x1  tonight.  He is to followup with me in the clinic in the morning or go to the emergency room tonight if worse.  If tachycardia persists I were to cause a sinus tachycardia including possible hyperthyroidism

## 2012-06-04 ENCOUNTER — Ambulatory Visit (INDEPENDENT_AMBULATORY_CARE_PROVIDER_SITE_OTHER): Payer: Medicare Other | Admitting: Family Medicine

## 2012-06-04 ENCOUNTER — Encounter: Payer: Self-pay | Admitting: Family Medicine

## 2012-06-04 VITALS — BP 140/70 | HR 78 | Temp 98.4°F | Resp 16

## 2012-06-04 DIAGNOSIS — R000 Tachycardia, unspecified: Secondary | ICD-10-CM | POA: Insufficient documentation

## 2012-06-04 DIAGNOSIS — I498 Other specified cardiac arrhythmias: Secondary | ICD-10-CM

## 2012-06-04 DIAGNOSIS — F411 Generalized anxiety disorder: Secondary | ICD-10-CM

## 2012-06-04 DIAGNOSIS — R197 Diarrhea, unspecified: Secondary | ICD-10-CM

## 2012-06-04 MED ORDER — DIPHENOXYLATE-ATROPINE 2.5-0.025 MG PO TABS: 1.0000 | ORAL_TABLET | Freq: Four times a day (QID) | ORAL | Status: DC | PRN

## 2012-06-04 MED ORDER — ALPRAZOLAM 0.25 MG PO TABS: 0.2500 mg | ORAL_TABLET | Freq: Two times a day (BID) | ORAL | Status: DC | PRN

## 2012-06-04 NOTE — Progress Notes (Signed)
Subjective:     Patient ID: Eugene [name], male   DOB: 1931/05/24, 79 y.o.   MRN: [id number]  HPI Patient's followup from yesterday.  He is much less anxious.  Heart rate has slowed tremendously on the Toprol.  He continues to have crampy abdominal pain and diarrhea.  Imodium is not helping.  He denies any nausea or vomiting.  He denies symptoms of bowel obstruction.    Review of Systems  Constitutional: Negative.   HENT: Negative.   Respiratory: Negative.   Cardiovascular: Negative.   Gastrointestinal: Positive for diarrhea (crampy abd pain).       Objective:   Physical Exam  Constitutional: He appears well-developed and well-nourished.  HENT:  Head: Normocephalic.  Neck: Normal range of motion. Neck supple.  Cardiovascular: Normal rate, regular rhythm and normal heart sounds.   Pulmonary/Chest: Effort normal and breath sounds normal.  Abdominal: Soft. Bowel sounds are normal. He exhibits no distension and no mass. There is tenderness. There is no rebound and no guarding.   is mildly tenderness to palpation the left lower quadrant.     Assessment:     Panic attack-resolved Gastroenteritis/abdominal pain Tachycardia resolved    Plan:     Continue the Toprol-XL 25 mg by mouth daily Had Lomotil 2 tablets by mouth every 6 hours when necessary abdominal pain or diarrhea. Xanax 0.25 mg by mouth every 8 hours when necessary anxiety attack.   Instructed the patient to monitor for fever or worsening left lower quadrant abdominal pain which could signify diverticulitis.  He is to call me if any of these develop for Cipro and Flagyl.  Also warned him about signs and symptoms of a bowel obstruction. Followup next week if no better

## 2012-06-11 ENCOUNTER — Ambulatory Visit: Payer: Medicare Other | Admitting: Family Medicine

## 2012-06-11 VITALS — BP 124/86 | HR 64

## 2012-06-11 NOTE — Progress Notes (Signed)
Subjective:     Patient ID: Eugene [name], male   DOB: May 08, 1931, 79 y.o.   MRN: [id number]  HPI Repeat blood pressure and heart rate are acceptable.    Review of Systems     Objective:   Physical Exam     Assessment:     HTN Tachycardia    Plan:     Continue Toprol XL 25 milligrams by mouth daily.

## 2012-06-11 NOTE — Progress Notes (Signed)
Patient ID: Eugene [name], male   DOB: 19-Aug-1931, 79 y.o.   MRN: [id number] Pt came in for Bp check.  Was at [facility] Clinic at [facility] last week and they found high BP readings 143/82      152/88      161/104. Sent pt back here with form to be completed by MD with recommendation for safe ranges for continued dental care.  BP today left arm 124/86   Pulse 68  Form needs to be completed and signed then faxed back to [facility].

## 2012-06-18 ENCOUNTER — Ambulatory Visit (INDEPENDENT_AMBULATORY_CARE_PROVIDER_SITE_OTHER): Payer: Medicare Other | Admitting: Physician Assistant

## 2012-06-18 ENCOUNTER — Encounter: Payer: Self-pay | Admitting: Physician Assistant

## 2012-06-18 VITALS — BP 120/76 | HR 76 | Temp 97.7°F | Resp 18 | Ht 67.0 in | Wt 156.0 lb

## 2012-06-18 DIAGNOSIS — R197 Diarrhea, unspecified: Secondary | ICD-10-CM

## 2012-06-18 NOTE — Progress Notes (Signed)
Patient ID: Eugene [name] MRN: [id number], DOB: 13-May-1931, 79 y.o. Date of Encounter: 06/18/2012, 4:36 PM    Chief Complaint:  Chief Complaint  Patient presents with  . does not feel well  cont to have diarrhea, feeling run down    saw Dr Demetrius Charity before for same not better     HPI: 79 y.o. year old male here secondary to diarrhea.  He brought bottle of amoxicillin to show me-Has to take this for SBE prophylaxis when he goes to dentist b/c of prior knee surgery.   He went to dentist 2 weeks ago (took amox prior)- developed diarrhea for 3 days.  Went back to dentist yesterday (took amox prior)- developed diarrhea yesterday.   He has had no vomiting. No focal/localized abdominal pain. Is using the prescription medicine Dr Tanya Nones gave him for diarrhea with episode 2 weeks ago.    Home Meds: Current Outpatient Prescriptions on File Prior to Visit  Medication Sig Dispense Refill  . aspirin EC 81 MG tablet Take 81 mg by mouth daily.      . diphenoxylate-atropine (LOMOTIL) 2.5-0.025 MG per tablet Take 1 tablet by mouth 4 (four) times daily as needed for diarrhea or loose stools.  30 tablet  0  . ferrous sulfate 325 (65 FE) MG tablet Take 325 mg by mouth daily with breakfast.      . fish oil-omega-3 fatty acids 1000 MG capsule Take 1 g by mouth daily.      . metoprolol succinate (TOPROL-XL) 25 MG 24 hr tablet Take 1 tablet (25 mg total) by mouth daily.  30 tablet  3  . vitamin B-12 (CYANOCOBALAMIN) 1000 MCG tablet Take 1,000 mcg by mouth daily.      Marland Kitchen ALPRAZolam (XANAX) 0.25 MG tablet Take 1 tablet (0.25 mg total) by mouth 2 (two) times daily as needed (for anxiety).  20 tablet  0   No current facility-administered medications on file prior to visit.    Allergies:  Allergies  Allergen Reactions  . Aleve (Naproxen Sodium) Nausea And Vomiting    No problem with other NSAIDs      Review of Systems: Constitutional: negative for chills, fever, night sweats, weight changes, or fatigue   HEENT: negative  Cardiovascular: negative for chest pain or palpitations Respiratory: negative  Abdominal: see hpi. Other pertinent ROS negative. Dermatological: negative for rash Neurologic: negative for headache, dizziness, or syncope   THROUGHOUT VISIT, HIS ABDOMEN IS CONSTANTLY GURGLING** Physical Exam: Blood pressure 120/76, pulse 76, temperature 97.7 F (36.5 C), resp. rate 18, height 5\' 7"  (1.702 m), weight 156 lb (70.761 kg)., Body mass index is 24.43 kg/(m^2). General: Well developed, well nourished, in no acute distress. Neck: Supple. No thyromegaly. Full ROM. No lymphadenopathy. Lungs: Clear bilaterally to auscultation without wheezes, rales, or rhonchi. Breathing is unlabored. Heart: RRR with S1 S2. No murmurs, rubs, or gallops appreciated. Abdomen: Throughout visit, his abdomen is constantly gurgling. Can hear movement through intestine. On exam: Very active bowel sounds. Soft, non-tender, non-distended. No area of tenderness with palpation.  No hepatomegaly. No rebound/guarding. No obvious abdominal masses. Msk:  Strength and tone normal for age. Extremities/Skin: Warm and dry. No clubbing or cyanosis. No edema. No rashes or suspicious lesions. Neuro: Alert and oriented X 3. Moves all extremities spontaneously. Gait is normal. CNII-XII grossly in tact. Psych:  Responds to questions appropriately with a normal affect.    ASSESSMENT AND PLAN:  79 y.o. year old male with  1. Diarrhea  Seems to be  secondary to Amoxicillin. Discussed changing SBE Prophylaxis-he says he has no further dental appointments scheduled. Wants to wait to f/u this.  He is to use clear liquid diet. Advance to bland diet as tolerated.Start a probiotic such as Librarian, academic. Force fluids to prevent dehydration. If develops localized abdominal pain, fever, worsening symptoms, f/u.    [address] [place], [place], [facility] 06/18/2012 4:36 PM

## 2012-07-23 ENCOUNTER — Other Ambulatory Visit (INDEPENDENT_AMBULATORY_CARE_PROVIDER_SITE_OTHER): Payer: Medicare Other

## 2012-07-23 DIAGNOSIS — Z125 Encounter for screening for malignant neoplasm of prostate: Secondary | ICD-10-CM

## 2012-07-23 DIAGNOSIS — Z79899 Other long term (current) drug therapy: Secondary | ICD-10-CM

## 2012-07-23 DIAGNOSIS — Z0000 Encounter for general adult medical examination without abnormal findings: Secondary | ICD-10-CM

## 2012-07-23 DIAGNOSIS — E782 Mixed hyperlipidemia: Secondary | ICD-10-CM

## 2012-07-23 LAB — COMPREHENSIVE METABOLIC PANEL
ALT: 17 U/L (ref 0–53)
AST: 19 U/L (ref 0–37)
Albumin: 4.1 g/dL (ref 3.5–5.2)
Alkaline Phosphatase: 66 U/L (ref 39–117)
BUN: 10 mg/dL (ref 6–23)
CO2: 27 mEq/L (ref 19–32)
Calcium: 9.7 mg/dL (ref 8.4–10.5)
Chloride: 105 mEq/L (ref 96–112)
Creat: 0.94 mg/dL (ref 0.50–1.35)
Glucose, Bld: 90 mg/dL (ref 70–99)
Potassium: 5 mEq/L (ref 3.5–5.3)
Sodium: 141 mEq/L (ref 135–145)
Total Bilirubin: 1.2 mg/dL (ref 0.3–1.2)
Total Protein: 6.6 g/dL (ref 6.0–8.3)

## 2012-07-23 LAB — CBC WITH DIFFERENTIAL/PLATELET
Basophils Absolute: 0 10*3/uL (ref 0.0–0.1)
Basophils Relative: 1 % (ref 0–1)
Eosinophils Absolute: 0.2 10*3/uL (ref 0.0–0.7)
Eosinophils Relative: 4 % (ref 0–5)
HCT: 44.7 % (ref 39.0–52.0)
Hemoglobin: 15 g/dL (ref 13.0–17.0)
Lymphocytes Relative: 33 % (ref 12–46)
Lymphs Abs: 1.7 10*3/uL (ref 0.7–4.0)
MCH: 31.7 pg (ref 26.0–34.0)
MCHC: 33.6 g/dL (ref 30.0–36.0)
MCV: 94.5 fL (ref 78.0–100.0)
Monocytes Absolute: 0.6 10*3/uL (ref 0.1–1.0)
Monocytes Relative: 12 % (ref 3–12)
Neutro Abs: 2.7 10*3/uL (ref 1.7–7.7)
Neutrophils Relative %: 50 % (ref 43–77)
Platelets: 291 10*3/uL (ref 150–400)
RBC: 4.73 MIL/uL (ref 4.22–5.81)
RDW: 13.8 % (ref 11.5–15.5)
WBC: 5.3 10*3/uL (ref 4.0–10.5)

## 2012-07-23 LAB — LIPID PANEL
Cholesterol: 178 mg/dL (ref 0–200)
HDL: 75 mg/dL (ref >39)
LDL Cholesterol: 94 mg/dL (ref 0–99)
Total CHOL/HDL Ratio: 2.4 Ratio
Triglycerides: 47 mg/dL (ref <150)
VLDL: 9 mg/dL (ref 0–40)

## 2012-07-23 LAB — PSA, MEDICARE: PSA: 1.68 ng/mL (ref <=4.00)

## 2012-07-26 ENCOUNTER — Encounter: Payer: Self-pay | Admitting: Family Medicine

## 2012-07-29 ENCOUNTER — Encounter: Payer: Self-pay | Admitting: Family Medicine

## 2012-07-29 ENCOUNTER — Ambulatory Visit (INDEPENDENT_AMBULATORY_CARE_PROVIDER_SITE_OTHER): Payer: Medicare Other | Admitting: Family Medicine

## 2012-07-29 VITALS — BP 130/60 | HR 86 | Temp 97.8°F | Resp 14 | Wt 159.0 lb

## 2012-07-29 DIAGNOSIS — Z23 Encounter for immunization: Secondary | ICD-10-CM

## 2012-07-29 DIAGNOSIS — Z0000 Encounter for general adult medical examination without abnormal findings: Secondary | ICD-10-CM

## 2012-07-29 NOTE — Progress Notes (Signed)
Subjective:    Patient ID: Eugene [name], male    DOB: March 11, 1932, 79 y.o.   MRN: [id number]  HPI Patient is here today for complete physical exam. He has no other concerns. His colonoscopy was last performed in 2008. This is well up to date. He had a Pneumovax in 2004. He had Zostavax in 2008. His tetanus shot is over due, but the patient refuses due to $35 cost.  He is due for his prostate exam. Otherwise his screening healthcare is up-to-date. Past Medical History  Diagnosis Date  . History of stomach ulcers    he underwent gastrectomy in the remote past. He has history of diverticulosis. 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 Current Outpatient Prescriptions on File Prior to Visit  Medication Sig Dispense Refill  . aspirin EC 81 MG tablet Take 81 mg by mouth daily.      . ferrous sulfate 325 (65 FE) MG tablet Take 325 mg by mouth daily with breakfast.      . fish oil-omega-3 fatty acids 1000 MG capsule Take 1 g by mouth daily.      . vitamin B-12 (CYANOCOBALAMIN) 1000 MCG tablet Take 1,000 mcg by mouth daily.       No current facility-administered medications on file prior to visit.   Allergies  Allergen Reactions  . Aleve (Naproxen Sodium) Nausea And Vomiting    No problem with other NSAIDs  . Penicillins     Diarrhea    History   Social History  . Marital Status: Married    Spouse Name: N/A    Number of Children: N/A  . Years of Education: N/A   Occupational History  . Not on file.   Social History Main Topics  . Smoking status: Never Smoker   . Smokeless tobacco: Never Used  . Alcohol Use: Yes     Comment: occasional  . Drug Use: No  . Sexually Active: Yes     Comment: married   Other Topics Concern  . Not on file   Social History Narrative  . No narrative on file   Family History  Problem Relation Age of Onset  . Diabetes Sister   .  Diabetes Brother   . Diabetes Sister   . Cancer Sister     breast      Review of Systems  All other systems reviewed and are negative.       Objective:   Physical Exam  Vitals reviewed. Constitutional: He is oriented to person, place, and time. He appears well-developed and well-nourished.  HENT:  Head: Normocephalic and atraumatic.  Right Ear: External ear normal.  Left Ear: External ear normal.  Nose: Nose normal.  Mouth/Throat: Oropharynx is clear and moist. No oropharyngeal exudate.  Eyes: Conjunctivae and EOM are normal. Pupils are equal, round, and reactive to light. Right eye exhibits no discharge. Left eye exhibits no discharge. No scleral icterus.  Neck: Normal range of motion. Neck supple. No JVD present. No tracheal deviation present. No thyromegaly present.  Cardiovascular: Normal rate, regular rhythm, normal heart sounds and intact distal pulses.  Exam reveals no gallop and no friction rub.   No murmur heard. Pulmonary/Chest: Effort normal and breath sounds normal. No respiratory distress. He has no wheezes. He has no rales. He exhibits no tenderness.  Abdominal: Soft. Bowel sounds are normal. He exhibits no distension  and no mass. There is no tenderness. There is no rebound and no guarding.  Genitourinary: Rectum normal, prostate normal and penis normal. No penile tenderness.  Musculoskeletal: Normal range of motion. He exhibits no edema and no tenderness.  Lymphadenopathy:    He has no cervical adenopathy.  Neurological: He is alert and oriented to person, place, and time. He has normal reflexes. He displays normal reflexes. No cranial nerve deficit. He exhibits normal muscle tone. Coordination normal.  Skin: Skin is warm and dry. No rash noted. No erythema. No pallor.  Psychiatric: He has a normal mood and affect. His behavior is normal. Judgment and thought content normal.          Assessment & Plan:  1. Routine general medical examination at a health care  facility Patient's exam today is normal. I reviewed his CBC, CMP, fasting lipid panel, PSA which were all within normal limits. His heart rate is well controlled. He has not been on the Toprol and over a month. Therefore I think he can completely discontinue it. The majority of his shots are up to date. He is due for tetanus but he declines this due to cost. His colonoscopy and prostate exams are now up-to-date. Regular anticipatory guidance was provided.   followup in one year or when necessary.

## 2012-07-29 NOTE — Addendum Note (Signed)
Addended by: Donne Anon on: 07/29/2012 02:36 PM   Modules accepted: Orders

## 2012-12-28 ENCOUNTER — Ambulatory Visit (INDEPENDENT_AMBULATORY_CARE_PROVIDER_SITE_OTHER): Payer: Medicare Other | Admitting: Family Medicine

## 2012-12-28 DIAGNOSIS — Z23 Encounter for immunization: Secondary | ICD-10-CM

## 2013-01-17 ENCOUNTER — Other Ambulatory Visit: Payer: Self-pay | Admitting: Physician Assistant

## 2013-03-04 ENCOUNTER — Encounter: Payer: Self-pay | Admitting: Family Medicine

## 2013-03-04 ENCOUNTER — Ambulatory Visit (INDEPENDENT_AMBULATORY_CARE_PROVIDER_SITE_OTHER): Payer: Medicare Other | Admitting: Family Medicine

## 2013-03-04 VITALS — BP 110/60 | HR 72 | Temp 97.4°F | Resp 16 | Ht 70.0 in | Wt 163.0 lb

## 2013-03-04 DIAGNOSIS — R197 Diarrhea, unspecified: Secondary | ICD-10-CM

## 2013-03-04 MED ORDER — SACCHAROMYCES BOULARDII 250 MG PO CAPS: 250.0000 mg | ORAL_CAPSULE | Freq: Two times a day (BID) | ORAL | Status: DC

## 2013-03-04 NOTE — Progress Notes (Signed)
Subjective:    Patient ID: Eugene [name], male    DOB: Nov 29, 1931, 79 y.o.   MRN: [id number]  HPI  Patient reports 3 weeks of mild diarrhea. He is having 3 bowel movements a day that are extremely loose. He denies any abdominal pain. He denies any melena or hematochezia. He denies any nausea or vomiting. He denies any fever. He denies any weight loss. His weight is at the L4 pounces office visit this summer. He denies any rashes or arthralgias. He denies any fatigue. Symptoms began after he took one week of antibiotics for dental work.  History over-the-counter Imodium and it works well.  He denies any sick contacts. He denies any recent travel.  He denies any change in his diet. He denies any foods that trigger the diarrhea. Past Medical History  Diagnosis Date  . History of stomach ulcers    Current Outpatient Prescriptions on File Prior to Visit  Medication Sig Dispense Refill  . aspirin EC 81 MG tablet Take 81 mg by mouth daily.      . fish oil-omega-3 fatty acids 1000 MG capsule Take 1 g by mouth daily.       No current facility-administered medications on file prior to visit.   Allergies  Allergen Reactions  . Aleve [Naproxen Sodium] Nausea And Vomiting    No problem with other NSAIDs  . Penicillins     Diarrhea    History   Social History  . Marital Status: Married    Spouse Name: N/A    Number of Children: N/A  . Years of Education: N/A   Occupational History  . Not on file.   Social History Main Topics  . Smoking status: Never Smoker   . Smokeless tobacco: Never Used  . Alcohol Use: Yes     Comment: occasional  . Drug Use: No  . Sexual Activity: Yes     Comment: married   Other Topics Concern  . Not on file   Social History Narrative  . No narrative on file     Review of Systems  All other systems reviewed and are negative.       Objective:   Physical Exam  Vitals reviewed. Constitutional: He appears well-developed and well-nourished. No  distress.  HENT:  Nose: Nose normal.  Mouth/Throat: Oropharynx is clear and moist. No oropharyngeal exudate.  Eyes: Conjunctivae are normal. Pupils are equal, round, and reactive to light. No scleral icterus.  Neck: Neck supple. No thyromegaly present.  Cardiovascular: Normal rate, regular rhythm and normal heart sounds.  Exam reveals no gallop and no friction rub.   No murmur heard. Pulmonary/Chest: Effort normal and breath sounds normal. No respiratory distress. He has no wheezes. He has no rales. He exhibits no tenderness.  Abdominal: Soft. Bowel sounds are normal. He exhibits no distension and no mass. There is no tenderness. There is no rebound and no guarding.  Lymphadenopathy:    He has no cervical adenopathy.  Skin: He is not diaphoretic.          Assessment & Plan:  1. Diarrhea I believe his diarrhea represents a bacterial imbalance in his colon triggered by antibiotics. I recommended that he start florastor 250 mg poqday for 1-2 weeks and use imodium once daily.  I anticipate spontaneous resolution.  If symptoms do not improve, or if symptoms worsen I want the patient to return immediately for stool cultures, stool O&P, Hemoccult cards, cbc, cmp, and tsh.  Recheck in one week. -  saccharomyces boulardii (FLORASTOR) 250 MG capsule; Take 1 capsule (250 mg total) by mouth 2 (two) times daily.  Dispense: 30 capsule; Refill: 0

## 2013-07-27 ENCOUNTER — Other Ambulatory Visit: Payer: Commercial Managed Care - HMO

## 2013-07-27 DIAGNOSIS — Z125 Encounter for screening for malignant neoplasm of prostate: Secondary | ICD-10-CM

## 2013-07-27 DIAGNOSIS — E785 Hyperlipidemia, unspecified: Secondary | ICD-10-CM

## 2013-07-27 DIAGNOSIS — Z0000 Encounter for general adult medical examination without abnormal findings: Secondary | ICD-10-CM

## 2013-07-27 LAB — COMPREHENSIVE METABOLIC PANEL
ALT: 18 U/L (ref 0–53)
AST: 21 U/L (ref 0–37)
Albumin: 3.9 g/dL (ref 3.5–5.2)
Alkaline Phosphatase: 56 U/L (ref 39–117)
BUN: 13 mg/dL (ref 6–23)
CO2: 29 mEq/L (ref 19–32)
Calcium: 9.2 mg/dL (ref 8.4–10.5)
Chloride: 105 mEq/L (ref 96–112)
Creat: 1.01 mg/dL (ref 0.50–1.35)
Glucose, Bld: 80 mg/dL (ref 70–99)
Potassium: 4.8 mEq/L (ref 3.5–5.3)
Sodium: 141 mEq/L (ref 135–145)
Total Bilirubin: 0.9 mg/dL (ref 0.2–1.2)
Total Protein: 6.3 g/dL (ref 6.0–8.3)

## 2013-07-27 LAB — CBC WITH DIFFERENTIAL/PLATELET
Basophils Absolute: 0 10*3/uL (ref 0.0–0.1)
Basophils Relative: 0 % (ref 0–1)
Eosinophils Absolute: 0.3 10*3/uL (ref 0.0–0.7)
Eosinophils Relative: 5 % (ref 0–5)
HCT: 45.2 % (ref 39.0–52.0)
Hemoglobin: 15.2 g/dL (ref 13.0–17.0)
Lymphocytes Relative: 28 % (ref 12–46)
Lymphs Abs: 1.8 10*3/uL (ref 0.7–4.0)
MCH: 31.9 pg (ref 26.0–34.0)
MCHC: 33.6 g/dL (ref 30.0–36.0)
MCV: 94.8 fL (ref 78.0–100.0)
Monocytes Absolute: 0.6 10*3/uL (ref 0.1–1.0)
Monocytes Relative: 9 % (ref 3–12)
Neutro Abs: 3.7 10*3/uL (ref 1.7–7.7)
Neutrophils Relative %: 58 % (ref 43–77)
Platelets: 300 10*3/uL (ref 150–400)
RBC: 4.77 MIL/uL (ref 4.22–5.81)
RDW: 13.5 % (ref 11.5–15.5)
WBC: 6.3 10*3/uL (ref 4.0–10.5)

## 2013-07-27 LAB — LIPID PANEL
Cholesterol: 183 mg/dL (ref 0–200)
HDL: 60 mg/dL (ref >39)
LDL Cholesterol: 110 mg/dL — ABNORMAL HIGH (ref 0–99)
Total CHOL/HDL Ratio: 3.1 Ratio
Triglycerides: 66 mg/dL (ref <150)
VLDL: 13 mg/dL (ref 0–40)

## 2013-07-28 LAB — PSA, MEDICARE: PSA: 2.76 ng/mL (ref <=4.00)

## 2013-08-02 ENCOUNTER — Encounter: Payer: Medicare Other | Admitting: Family Medicine

## 2013-08-11 ENCOUNTER — Other Ambulatory Visit: Payer: Self-pay | Admitting: Family Medicine

## 2013-08-11 ENCOUNTER — Ambulatory Visit (INDEPENDENT_AMBULATORY_CARE_PROVIDER_SITE_OTHER): Payer: Commercial Managed Care - HMO | Admitting: Family Medicine

## 2013-08-11 ENCOUNTER — Encounter: Payer: Self-pay | Admitting: Family Medicine

## 2013-08-11 VITALS — BP 128/74 | HR 82 | Temp 97.1°F | Resp 18 | Ht 70.0 in | Wt 162.0 lb

## 2013-08-11 DIAGNOSIS — Z0000 Encounter for general adult medical examination without abnormal findings: Secondary | ICD-10-CM

## 2013-08-11 NOTE — Progress Notes (Signed)
Subjective:    Patient ID: Eugene [name], male    DOB: 01-12-1932, 80 y.o.   MRN: [id number]  HPI Subjective:   Patient presents for Medicare Annual/Subsequent preventive examination.   He has been having several months of loose stools.  2-3  Times a day, very loose.  No melena or hematochezia.  No fever or weight loss.  Tried florastor without benefit.  Review Past Medical/Family/Social: Past Medical History  Diagnosis Date  . History of stomach ulcers    Past Surgical History  Procedure Laterality Date  . Total knee arthroplasty    . Leg amputation    . Back surgery    . Stomach surgery     Current Outpatient Prescriptions on File Prior to Visit  Medication Sig Dispense Refill  . aspirin EC 81 MG tablet Take 81 mg by mouth daily.      Marland Kitchen BIOTIN 5000 PO Take by mouth.      . fish oil-omega-3 fatty acids 1000 MG capsule Take 1 g by mouth daily.      Marland Kitchen saccharomyces boulardii (FLORASTOR) 250 MG capsule Take 1 capsule (250 mg total) by mouth 2 (two) times daily.  30 capsule  0   No current facility-administered medications on file prior to visit.   Allergies  Allergen Reactions  . Aleve [Naproxen Sodium] Nausea And Vomiting    No problem with other NSAIDs  . Penicillins     Diarrhea    History   Social History  . Marital Status: Married    Spouse Name: N/A    Number of Children: N/A  . Years of Education: N/A   Occupational History  . Not on file.   Social History Main Topics  . Smoking status: Never Smoker   . Smokeless tobacco: Never Used  . Alcohol Use: Yes     Comment: occasional  . Drug Use: No  . Sexual Activity: Yes     Comment: married   Other Topics Concern  . Not on file   Social History Narrative  . No narrative on file   Family History  Problem Relation Age of Onset  . Diabetes Sister   . Diabetes Brother   . Diabetes Sister   . Cancer Sister     breast     Depression Screen  (Note: if answer to either of the following is "Yes", a  more complete depression screening is indicated)  Over the past two weeks, have you felt down, depressed or hopeless? No Over the past two weeks, have you felt little interest or pleasure in doing things? No Have you lost interest or pleasure in daily life? No Do you often feel hopeless? No Do you cry easily over simple problems? No   Activities of Daily Living  In your present state of health, do you have any difficulty performing the following activities?:  Driving? No  Managing money? No  Feeding yourself? No  Getting from bed to chair? No  Climbing a flight of stairs? No  Preparing food and eating?: No  Bathing or showering? No  Getting dressed: No  Getting to the toilet? No  Using the toilet:No  Moving around from place to place: No  In the past year have you fallen or had a near fall?:No  Are you sexually active? No  Do you have more than one partner? No   Hearing Difficulties: No  Do you often ask people to speak up or repeat themselves? No  Do you experience  ringing or noises in your ears? No Do you have difficulty understanding soft or whispered voices? No  Do you feel that you have a problem with memory? No Do you often misplace items? No  Do you feel safe at home? Yes  Cognitive Testing  Alert? Yes Normal Appearance?Yes  Oriented to person? Yes Place? Yes  Time? Yes  Recall of three objects? Yes  Can perform simple calculations? Yes  Displays appropriate judgment?Yes  Can read the correct time from a watch face?Yes    Screening Tests / Date Colonoscopy          2008           Zostavax UTD Tetanus/tdap UTD Pneumovax- UTD     Review of Systems  All other systems reviewed and are negative.      Objective:   Physical Exam  Vitals reviewed. Constitutional: He is oriented to person, place, and time. He appears well-developed and well-nourished. No distress.  HENT:  Head: Normocephalic and atraumatic.  Right Ear: External ear normal.  Left Ear:  External ear normal.  Nose: Nose normal.  Mouth/Throat: Oropharynx is clear and moist. No oropharyngeal exudate.  Eyes: Conjunctivae and EOM are normal. Pupils are equal, round, and reactive to light. Right eye exhibits no discharge. Left eye exhibits no discharge. No scleral icterus.  Neck: Normal range of motion. Neck supple. No JVD present. No tracheal deviation present. No thyromegaly present.  Cardiovascular: Normal rate, regular rhythm, normal heart sounds and intact distal pulses.  Exam reveals no gallop and no friction rub.   No murmur heard. Pulmonary/Chest: Effort normal and breath sounds normal. No stridor. No respiratory distress. He has no wheezes. He has no rales. He exhibits no tenderness.  Abdominal: Soft. Bowel sounds are normal. He exhibits no distension and no mass. There is no tenderness. There is no rebound and no guarding.  Genitourinary: Rectum normal, prostate normal and penis normal.  Musculoskeletal: Normal range of motion. He exhibits no edema and no tenderness.  Lymphadenopathy:    He has no cervical adenopathy.  Neurological: He is alert and oriented to person, place, and time. He has normal reflexes. He displays normal reflexes. No cranial nerve deficit. He exhibits normal muscle tone. Coordination normal.  Skin: Skin is warm. No rash noted. He is not diaphoretic. No erythema. No pallor.  Psychiatric: He has a normal mood and affect. His behavior is normal. Judgment and thought content normal.     Lab on 07/27/2013  Component Date Value Ref Range Status  . Sodium 07/27/2013 141  135 - 145 mEq/L Final  . Potassium 07/27/2013 4.8  3.5 - 5.3 mEq/L Final  . Chloride 07/27/2013 105  96 - 112 mEq/L Final  . CO2 07/27/2013 29  19 - 32 mEq/L Final  . Glucose, Bld 07/27/2013 80  70 - 99 mg/dL Final  . BUN 07/27/2013 13  6 - 23 mg/dL Final  . Creat 07/27/2013 1.01  0.50 - 1.35 mg/dL Final  . Total Bilirubin 07/27/2013 0.9  0.2 - 1.2 mg/dL Final  . Alkaline Phosphatase  07/27/2013 56  39 - 117 U/L Final  . AST 07/27/2013 21  0 - 37 U/L Final  . ALT 07/27/2013 18  0 - 53 U/L Final  . Total Protein 07/27/2013 6.3  6.0 - 8.3 g/dL Final  . Albumin 07/27/2013 3.9  3.5 - 5.2 g/dL Final  . Calcium 07/27/2013 9.2  8.4 - 10.5 mg/dL Final  . WBC 07/27/2013 6.3  4.0 - 10.5  K/uL Final  . RBC 07/27/2013 4.77  4.22 - 5.81 MIL/uL Final  . Hemoglobin 07/27/2013 15.2  13.0 - 17.0 g/dL Final  . HCT 07/27/2013 45.2  39.0 - 52.0 % Final  . MCV 07/27/2013 94.8  78.0 - 100.0 fL Final  . MCH 07/27/2013 31.9  26.0 - 34.0 pg Final  . MCHC 07/27/2013 33.6  30.0 - 36.0 g/dL Final  . RDW 07/27/2013 13.5  11.5 - 15.5 % Final  . Platelets 07/27/2013 300  150 - 400 K/uL Final  . Neutrophils Relative % 07/27/2013 58  43 - 77 % Final  . Neutro Abs 07/27/2013 3.7  1.7 - 7.7 K/uL Final  . Lymphocytes Relative 07/27/2013 28  12 - 46 % Final  . Lymphs Abs 07/27/2013 1.8  0.7 - 4.0 K/uL Final  . Monocytes Relative 07/27/2013 9  3 - 12 % Final  . Monocytes Absolute 07/27/2013 0.6  0.1 - 1.0 K/uL Final  . Eosinophils Relative 07/27/2013 5  0 - 5 % Final  . Eosinophils Absolute 07/27/2013 0.3  0.0 - 0.7 K/uL Final  . Basophils Relative 07/27/2013 0  0 - 1 % Final  . Basophils Absolute 07/27/2013 0.0  0.0 - 0.1 K/uL Final  . Smear Review 07/27/2013 Criteria for review not met   Final  . Cholesterol 07/27/2013 183  0 - 200 mg/dL Final   Comment: ATP III Classification:                                < 200        mg/dL        Desirable                               200 - 239     mg/dL        Borderline High                               >= 240        mg/dL        High                             . Triglycerides 07/27/2013 66  <150 mg/dL Final  . HDL 07/27/2013 60  >39 mg/dL Final  . Total CHOL/HDL Ratio 07/27/2013 3.1   Final  . VLDL 07/27/2013 13  0 - 40 mg/dL Final  . LDL Cholesterol 07/27/2013 110* 0 - 99 mg/dL Final   Comment:                            Total Cholesterol/HDL  Ratio:CHD Risk                                                 Coronary Heart Disease Risk Table  Men       Women                                   1/2 Average Risk              3.4        3.3                                       Average Risk              5.0        4.4                                    2X Average Risk              9.6        7.1                                    3X Average Risk             23.4       11.0                          Use the calculated Patient Ratio above and the CHD Risk table                           to determine the patient's CHD Risk.                          ATP III Classification (LDL):                                < 100        mg/dL         Optimal                               100 - 129     mg/dL         Near or Above Optimal                               130 - 159     mg/dL         Borderline High                               160 - 189     mg/dL         High                                > 190        mg/dL         Very High                             .  PSA 07/27/2013 2.76  <=4.00 ng/mL Final   Comment: Test Methodology: ECLIA PSA (Electrochemiluminescence Immunoassay)                                                     For PSA values from 2.5-4.0, particularly in younger men <60 years                          old, the AUA and NCCN suggest testing for % Free PSA (3515) and                          evaluation of the rate of increase in PSA (PSA velocity).        Assessment & Plan:  1. Routine general medical examination at a health care facility Physical exam is normal. Lab work is excellent. PSA has risen 1.1 year. His rectal exam today is normal. I will recheck a PSA in 6 months. Otherwise no concerning findings are found down his exam. Patient did receive Prevnar 13 today. His preventive care is up to date. I recommended trying Metamucil over-the-counter for diarrhea. If  diarrhea persists, I would recommend a colonoscopy to rule out IBD, etc.  Screen neg for depression.  Medicare Attestation  I have personally reviewed:  The patient's medical and social history  Their use of alcohol, tobacco or illicit drugs  Their current medications and supplements  The patient's functional ability including ADLs,fall risks, home safety risks, cognitive, and hearing and visual impairment  Diet and physical activities  Evidence for depression or mood disorders  The patient's weight, height, BMI, and visual acuity have been recorded in the chart. I have made referrals, counseling, and provided education to the patient based on review of the above and I have provided the patient with a written personalized care plan for preventive services.

## 2013-08-23 ENCOUNTER — Other Ambulatory Visit: Payer: Self-pay | Admitting: Dermatology

## 2013-10-05 ENCOUNTER — Other Ambulatory Visit: Payer: Commercial Managed Care - HMO

## 2013-10-05 DIAGNOSIS — Z1211 Encounter for screening for malignant neoplasm of colon: Secondary | ICD-10-CM

## 2013-10-06 LAB — FECAL OCCULT BLOOD, IMMUNOCHEMICAL
Fecal Occult Blood: NEGATIVE
Fecal Occult Blood: NEGATIVE
Fecal Occult Blood: NEGATIVE

## 2013-10-10 ENCOUNTER — Telehealth: Payer: Self-pay | Admitting: Family Medicine

## 2013-10-10 DIAGNOSIS — Z89511 Acquired absence of right leg below knee: Secondary | ICD-10-CM

## 2013-10-10 NOTE — Telephone Encounter (Signed)
Patient is calling to ask if we can send to rx to [facility] for the cover for his prosthetic leg  Phone number is [phone]

## 2013-10-13 ENCOUNTER — Encounter: Payer: Self-pay | Admitting: Family Medicine

## 2013-10-13 DIAGNOSIS — Z89511 Acquired absence of right leg below knee: Secondary | ICD-10-CM | POA: Insufficient documentation

## 2013-10-13 NOTE — Telephone Encounter (Signed)
Called [facility] - need written order for protective covers for prosthetic leg Dx: Right below knee amputation.  Order faxed to [phone]

## 2013-10-25 ENCOUNTER — Ambulatory Visit (INDEPENDENT_AMBULATORY_CARE_PROVIDER_SITE_OTHER): Payer: Commercial Managed Care - HMO | Admitting: Family Medicine

## 2013-10-25 ENCOUNTER — Encounter: Payer: Self-pay | Admitting: Family Medicine

## 2013-10-25 VITALS — BP 110/68 | HR 62 | Temp 98.1°F | Resp 18 | Wt 159.0 lb

## 2013-10-25 DIAGNOSIS — R197 Diarrhea, unspecified: Secondary | ICD-10-CM

## 2013-10-25 DIAGNOSIS — K529 Noninfective gastroenteritis and colitis, unspecified: Secondary | ICD-10-CM

## 2013-10-25 LAB — CBC WITH DIFFERENTIAL/PLATELET
Basophils Absolute: 0 10*3/uL (ref 0.0–0.1)
Basophils Relative: 0 % (ref 0–1)
Eosinophils Absolute: 0.1 10*3/uL (ref 0.0–0.7)
Eosinophils Relative: 1 % (ref 0–5)
HCT: 44.9 % (ref 39.0–52.0)
Hemoglobin: 15.2 g/dL (ref 13.0–17.0)
Lymphocytes Relative: 19 % (ref 12–46)
Lymphs Abs: 1.6 10*3/uL (ref 0.7–4.0)
MCH: 32.4 pg (ref 26.0–34.0)
MCHC: 33.9 g/dL (ref 30.0–36.0)
MCV: 95.7 fL (ref 78.0–100.0)
Monocytes Absolute: 0.8 10*3/uL (ref 0.1–1.0)
Monocytes Relative: 9 % (ref 3–12)
Neutro Abs: 6 10*3/uL (ref 1.7–7.7)
Neutrophils Relative %: 71 % (ref 43–77)
Platelets: 278 10*3/uL (ref 150–400)
RBC: 4.69 MIL/uL (ref 4.22–5.81)
RDW: 13 % (ref 11.5–15.5)
WBC: 8.4 10*3/uL (ref 4.0–10.5)

## 2013-10-25 LAB — TSH: TSH: 1.344 u[IU]/mL (ref 0.350–4.500)

## 2013-10-25 LAB — SEDIMENTATION RATE: Sed Rate: 1 mm/hr (ref 0–16)

## 2013-10-25 NOTE — Progress Notes (Signed)
   Subjective:    Patient ID: Eugene [name], male    DOB: 12-Jul-1931, 80 y.o.   MRN: [id number]  HPI  Patient has been having severe diarrhea now for almost 10 months.  I have started the patient on Restora without benefit.  Patient is also tried Metamucil without benefit. He has 3-4 watery bowel movements every day.  He denies fever. He denies weight loss. He denies melena or hematochezia. He denies any exacerbating or alleviating factors. He denies constipation. He denies abdominal pain. Past Medical History  Diagnosis Date  . History of stomach ulcers    Past Surgical History  Procedure Laterality Date  . Total knee arthroplasty    . Leg amputation      Right Below knee  . Back surgery    . Stomach surgery     Current Outpatient Prescriptions on File Prior to Visit  Medication Sig Dispense Refill  . aspirin EC 81 MG tablet Take 81 mg by mouth daily.      Marland Kitchen BIOTIN 5000 PO Take by mouth.      . fish oil-omega-3 fatty acids 1000 MG capsule Take 1 g by mouth daily.       No current facility-administered medications on file prior to visit.   Allergies  Allergen Reactions  . Aleve [Naproxen Sodium] Nausea And Vomiting    No problem with other NSAIDs  . Penicillins     Diarrhea    History   Social History  . Marital Status: Married    Spouse Name: N/A    Number of Children: N/A  . Years of Education: N/A   Occupational History  . Not on file.   Social History Main Topics  . Smoking status: Never Smoker   . Smokeless tobacco: Never Used  . Alcohol Use: Yes     Comment: occasional  . Drug Use: No  . Sexual Activity: Yes     Comment: married   Other Topics Concern  . Not on file   Social History Narrative  . No narrative on file     Review of Systems  All other systems reviewed and are negative.      Objective:   Physical Exam  Vitals reviewed. Cardiovascular: Normal rate, regular rhythm and normal heart sounds.   No murmur heard. Pulmonary/Chest:  Effort normal and breath sounds normal. No respiratory distress. He has no wheezes. He has no rales.  Abdominal: Soft. Bowel sounds are normal. He exhibits no distension. There is no tenderness. There is no rebound and no guarding.  Musculoskeletal: He exhibits no edema.          Assessment & Plan:  1. Chronic diarrhea Differential diagnosis includes inflammatory bowel disease, lymphocytic colitis, irritable bowel syndrome, thyroid abnormalities, gluten allergy/celiac disease. Evaluate for celiac disease a celiac panel. Evaluate for possible autoimmune diseases with a CBC and sedimentation rate. However abnormalities with TSH. Patient's last colonoscopy was in 2008. For the patient back to GI for repeat colonoscopy to evaluate for evidence of inflammatory bowel disease. - CBC with Differential - TSH - Sedimentation rate - Celiac panel 10 - Ambulatory referral to Gastroenterology

## 2013-10-26 LAB — CELIAC PANEL 10
Endomysial Screen: NEGATIVE
Gliadin IgA: 8.6 U/mL (ref <20)
Gliadin IgG: 5.3 U/mL (ref <20)
IgA: 161 mg/dL (ref 68–379)
Tissue Transglut Ab: 10 U/mL (ref <20)
Tissue Transglutaminase Ab, IgA: 6.4 U/mL (ref <20)

## 2013-10-27 ENCOUNTER — Telehealth: Payer: Self-pay | Admitting: Family Medicine

## 2013-10-27 NOTE — Telephone Encounter (Signed)
Patients wife Eugene [name] calling to see if we can schedule colonscopy with with dr Collene Mares instead of [place] they cannot see him until November [phone]

## 2013-11-09 NOTE — Telephone Encounter (Signed)
Referral to Dr. Collene Mares at pt request

## 2013-11-11 ENCOUNTER — Telehealth: Payer: Self-pay | Admitting: *Deleted

## 2013-11-11 NOTE — Telephone Encounter (Signed)
Daughter called stating needing a written letter stating that provider had written a RX for a protective cover for pts prosthetic leg to protect it. Please advise.

## 2013-11-14 ENCOUNTER — Telehealth: Payer: Self-pay | Admitting: *Deleted

## 2013-11-14 NOTE — Telephone Encounter (Signed)
Submitted [employer] referral thru [facility] for authorization for Dr. Carol Ada [facility] With authorizationn number [id number], will fax once recieved

## 2013-11-15 NOTE — Telephone Encounter (Signed)
Pt has already worked it out with [employer] and does not need the letter now.

## 2013-11-15 NOTE — Telephone Encounter (Signed)
Received fax from [facility] [place] [employer] with authorization [phone], information will be faxed to [facility].

## 2013-12-06 ENCOUNTER — Encounter: Payer: Self-pay | Admitting: Family Medicine

## 2013-12-26 ENCOUNTER — Encounter: Payer: Self-pay | Admitting: Family Medicine

## 2013-12-26 DIAGNOSIS — K52831 Collagenous colitis: Secondary | ICD-10-CM | POA: Insufficient documentation

## 2014-01-16 ENCOUNTER — Encounter: Payer: Self-pay | Admitting: Family Medicine

## 2014-01-17 ENCOUNTER — Ambulatory Visit (INDEPENDENT_AMBULATORY_CARE_PROVIDER_SITE_OTHER): Payer: Commercial Managed Care - HMO | Admitting: Family Medicine

## 2014-01-17 DIAGNOSIS — Z23 Encounter for immunization: Secondary | ICD-10-CM

## 2014-03-27 ENCOUNTER — Telehealth: Payer: Self-pay | Admitting: Family Medicine

## 2014-03-27 ENCOUNTER — Other Ambulatory Visit: Payer: Self-pay | Admitting: Dermatology

## 2014-03-27 DIAGNOSIS — C4492 Squamous cell carcinoma of skin, unspecified: Secondary | ICD-10-CM

## 2014-03-27 HISTORY — DX: Squamous cell carcinoma of skin, unspecified: C44.92

## 2014-03-30 NOTE — Telephone Encounter (Signed)
Submitted humana referral thru [facility] for authorization to [facility] Dr. Lavonna Monarch with Josem Kaufmann number [id number]  DX: L25.9- Unspecified contact dermatitis,unspecified cause.  Start Date 03/27/14 End Date:09/23/2014  Paper copy faxed to [facility]

## 2014-09-13 ENCOUNTER — Other Ambulatory Visit: Payer: Commercial Managed Care - HMO

## 2014-09-13 DIAGNOSIS — Z125 Encounter for screening for malignant neoplasm of prostate: Secondary | ICD-10-CM

## 2014-09-13 DIAGNOSIS — Z79899 Other long term (current) drug therapy: Secondary | ICD-10-CM

## 2014-09-13 DIAGNOSIS — E785 Hyperlipidemia, unspecified: Secondary | ICD-10-CM

## 2014-09-13 DIAGNOSIS — Z0000 Encounter for general adult medical examination without abnormal findings: Secondary | ICD-10-CM

## 2014-09-13 DIAGNOSIS — R000 Tachycardia, unspecified: Secondary | ICD-10-CM

## 2014-09-13 DIAGNOSIS — F411 Generalized anxiety disorder: Secondary | ICD-10-CM

## 2014-09-13 LAB — COMPLETE METABOLIC PANEL WITH GFR
ALT: 14 U/L (ref 0–53)
AST: 17 U/L (ref 0–37)
Albumin: 3.5 g/dL (ref 3.5–5.2)
Alkaline Phosphatase: 55 U/L (ref 39–117)
BUN: 16 mg/dL (ref 6–23)
CO2: 25 mEq/L (ref 19–32)
Calcium: 9.2 mg/dL (ref 8.4–10.5)
Chloride: 107 mEq/L (ref 96–112)
Creat: 0.82 mg/dL (ref 0.50–1.35)
GFR, Est African American: >89 mL/min
GFR, Est Non African American: 83 mL/min
Glucose, Bld: 82 mg/dL (ref 70–99)
Potassium: 4.4 mEq/L (ref 3.5–5.3)
Sodium: 143 mEq/L (ref 135–145)
Total Bilirubin: 0.9 mg/dL (ref 0.2–1.2)
Total Protein: 5.9 g/dL — ABNORMAL LOW (ref 6.0–8.3)

## 2014-09-13 LAB — CBC WITH DIFFERENTIAL/PLATELET
Basophils Absolute: 0.1 10*3/uL (ref 0.0–0.1)
Basophils Relative: 1 % (ref 0–1)
Eosinophils Absolute: 0.4 10*3/uL (ref 0.0–0.7)
Eosinophils Relative: 7 % — ABNORMAL HIGH (ref 0–5)
HCT: 44.2 % (ref 39.0–52.0)
Hemoglobin: 14.7 g/dL (ref 13.0–17.0)
Lymphocytes Relative: 31 % (ref 12–46)
Lymphs Abs: 1.9 10*3/uL (ref 0.7–4.0)
MCH: 32 pg (ref 26.0–34.0)
MCHC: 33.3 g/dL (ref 30.0–36.0)
MCV: 96.3 fL (ref 78.0–100.0)
MPV: 9.3 fL (ref 8.6–12.4)
Monocytes Absolute: 0.7 10*3/uL (ref 0.1–1.0)
Monocytes Relative: 12 % (ref 3–12)
Neutro Abs: 2.9 10*3/uL (ref 1.7–7.7)
Neutrophils Relative %: 49 % (ref 43–77)
Platelets: 278 10*3/uL (ref 150–400)
RBC: 4.59 MIL/uL (ref 4.22–5.81)
RDW: 13.2 % (ref 11.5–15.5)
WBC: 6 10*3/uL (ref 4.0–10.5)

## 2014-09-13 LAB — LIPID PANEL
Cholesterol: 165 mg/dL (ref 0–200)
HDL: 53 mg/dL (ref >=40)
LDL Cholesterol: 99 mg/dL (ref 0–99)
Total CHOL/HDL Ratio: 3.1 Ratio
Triglycerides: 66 mg/dL (ref <150)
VLDL: 13 mg/dL (ref 0–40)

## 2014-09-13 LAB — TSH: TSH: 1.127 u[IU]/mL (ref 0.350–4.500)

## 2014-09-14 LAB — PSA, MEDICARE: PSA: 1.66 ng/mL (ref <=4.00)

## 2014-09-21 ENCOUNTER — Encounter: Payer: Self-pay | Admitting: Family Medicine

## 2014-09-21 ENCOUNTER — Ambulatory Visit (INDEPENDENT_AMBULATORY_CARE_PROVIDER_SITE_OTHER): Payer: Commercial Managed Care - HMO | Admitting: Family Medicine

## 2014-09-21 VITALS — BP 128/70 | HR 78 | Temp 97.5°F | Resp 16 | Ht 70.0 in | Wt 161.0 lb

## 2014-09-21 DIAGNOSIS — Z23 Encounter for immunization: Secondary | ICD-10-CM

## 2014-09-21 DIAGNOSIS — Z0000 Encounter for general adult medical examination without abnormal findings: Secondary | ICD-10-CM | POA: Diagnosis not present

## 2014-09-21 NOTE — Addendum Note (Signed)
Addended by: Shary Decamp B on: 09/21/2014 12:40 PM   Modules accepted: Orders

## 2014-09-21 NOTE — Progress Notes (Signed)
Subjective:    Patient ID: Eugene [name], male    DOB: 11/05/1931, 81 y.o.   MRN: [id number]  HPI Patient is here today for complete physical exam. He is having some diarrhea. He had a colonoscopy performed in September 2015 which revealed collagenous colitis. He was treated with budesonide with significant improvement however he cannot afford that medication. Presently he is treating himself with over-the-counter Imodium and this is working well. Patient has had Pneumovax 23 as well as his tetanus vaccine and the shingles vaccine. He is due for Prevnar 13 today. His most recent lab work as listed below: Lab on 09/13/2014  Component Date Value Ref Range Status  . Sodium 09/13/2014 143  135 - 145 mEq/L Final  . Potassium 09/13/2014 4.4  3.5 - 5.3 mEq/L Final  . Chloride 09/13/2014 107  96 - 112 mEq/L Final  . CO2 09/13/2014 25  19 - 32 mEq/L Final  . Glucose, Bld 09/13/2014 82  70 - 99 mg/dL Final  . BUN 09/13/2014 16  6 - 23 mg/dL Final  . Creat 09/13/2014 0.82  0.50 - 1.35 mg/dL Final  . Total Bilirubin 09/13/2014 0.9  0.2 - 1.2 mg/dL Final  . Alkaline Phosphatase 09/13/2014 55  39 - 117 U/L Final  . AST 09/13/2014 17  0 - 37 U/L Final  . ALT 09/13/2014 14  0 - 53 U/L Final  . Total Protein 09/13/2014 5.9* 6.0 - 8.3 g/dL Final  . Albumin 09/13/2014 3.5  3.5 - 5.2 g/dL Final  . Calcium 09/13/2014 9.2  8.4 - 10.5 mg/dL Final  . GFR, Est African American 09/13/2014 >89   Final  . GFR, Est Non African American 09/13/2014 83   Final   Comment:   The estimated GFR is a calculation valid for adults (>=8 years old) that uses the CKD-EPI algorithm to adjust for age and sex. It is   not to be used for children, pregnant women, hospitalized patients,    patients on dialysis, or with rapidly changing kidney function. According to the NKDEP, eGFR >89 is normal, 60-89 shows mild impairment, 30-59 shows moderate impairment, 15-29 shows severe impairment and <15 is ESRD.     . TSH 09/13/2014  1.127  0.350 - 4.500 uIU/mL Final  . Cholesterol 09/13/2014 165  0 - 200 mg/dL Final   Comment: ATP III Classification:       < 200        mg/dL        Desirable      200 - 239     mg/dL        Borderline High      >= 240        mg/dL        High     . Triglycerides 09/13/2014 66  <150 mg/dL Final  . HDL 09/13/2014 53  >=40 mg/dL Final  . Total CHOL/HDL Ratio 09/13/2014 3.1   Final  . VLDL 09/13/2014 13  0 - 40 mg/dL Final  . LDL Cholesterol 09/13/2014 99  0 - 99 mg/dL Final   Comment:   Total Cholesterol/HDL Ratio:CHD Risk                        Coronary Heart Disease Risk Table  Men       Women          1/2 Average Risk              3.4        3.3              Average Risk              5.0        4.4           2X Average Risk              9.6        7.1           3X Average Risk             23.4       11.0 Use the calculated Patient Ratio above and the CHD Risk table  to determine the patient's CHD Risk. ATP III Classification (LDL):       < 100        mg/dL         Optimal      100 - 129     mg/dL         Near or Above Optimal      130 - 159     mg/dL         Borderline High      160 - 189     mg/dL         High       > 190        mg/dL         Very High     . WBC 09/13/2014 6.0  4.0 - 10.5 K/uL Final  . RBC 09/13/2014 4.59  4.22 - 5.81 MIL/uL Final  . Hemoglobin 09/13/2014 14.7  13.0 - 17.0 g/dL Final  . HCT 09/13/2014 44.2  39.0 - 52.0 % Final  . MCV 09/13/2014 96.3  78.0 - 100.0 fL Final  . MCH 09/13/2014 32.0  26.0 - 34.0 pg Final  . MCHC 09/13/2014 33.3  30.0 - 36.0 g/dL Final  . RDW 09/13/2014 13.2  11.5 - 15.5 % Final  . Platelets 09/13/2014 278  150 - 400 K/uL Final  . MPV 09/13/2014 9.3  8.6 - 12.4 fL Final  . Neutrophils Relative % 09/13/2014 49  43 - 77 % Final  . Neutro Abs 09/13/2014 2.9  1.7 - 7.7 K/uL Final  . Lymphocytes Relative 09/13/2014 31  12 - 46 % Final  . Lymphs Abs 09/13/2014 1.9  0.7 - 4.0 K/uL Final  .  Monocytes Relative 09/13/2014 12  3 - 12 % Final  . Monocytes Absolute 09/13/2014 0.7  0.1 - 1.0 K/uL Final  . Eosinophils Relative 09/13/2014 7* 0 - 5 % Final  . Eosinophils Absolute 09/13/2014 0.4  0.0 - 0.7 K/uL Final  . Basophils Relative 09/13/2014 1  0 - 1 % Final  . Basophils Absolute 09/13/2014 0.1  0.0 - 0.1 K/uL Final  . Smear Review 09/13/2014 Criteria for review not met   Final  . PSA 09/13/2014 1.66  <=4.00 ng/mL Final   Comment: Test Methodology: ECLIA PSA (Electrochemiluminescence Immunoassay)   For PSA values from 2.5-4.0, particularly in younger men <19 years old, the AUA and NCCN suggest testing for % Free PSA (3515) and evaluation of the rate of increase in PSA (PSA velocity).    Past Medical History  Diagnosis Date  .  History of stomach ulcers   . Collagenous colitis     Dr. Benson [place]   Past Surgical History  Procedure Laterality Date  . Total knee arthroplasty    . Leg amputation      Right Below knee  . Back surgery    . Stomach surgery      Billroth II gastrojejunostomy   Current Outpatient Prescriptions on File Prior to Visit  Medication Sig Dispense Refill  . BIOTIN 5000 PO Take by mouth.    . fish oil-omega-3 fatty acids 1000 MG capsule Take 1 g by mouth daily.     No current facility-administered medications on file prior to visit.   Allergies  Allergen Reactions  . Aleve [Naproxen Sodium] Nausea And Vomiting    No problem with other NSAIDs  . Penicillins     Diarrhea    History   Social History  . Marital Status: Married    Spouse Name: N/A  . Number of Children: N/A  . Years of Education: N/A   Occupational History  . Not on file.   Social History Main Topics  . Smoking status: Never Smoker   . Smokeless tobacco: Never Used  . Alcohol Use: Yes     Comment: occasional  . Drug Use: No  . Sexual Activity: Yes     Comment: married   Other Topics Concern  . Not on file   Social History Narrative   Family History  Problem  Relation Age of Onset  . Diabetes Sister   . Diabetes Brother   . Diabetes Sister   . Cancer Sister     breast      Review of Systems  All other systems reviewed and are negative.      Objective:   Physical Exam  Constitutional: He is oriented to person, place, and time. He appears well-developed and well-nourished. No distress.  HENT:  Head: Normocephalic and atraumatic.  Right Ear: External ear normal.  Left Ear: External ear normal.  Nose: Nose normal.  Mouth/Throat: Oropharynx is clear and moist. No oropharyngeal exudate.  Eyes: Conjunctivae and EOM are normal. Pupils are equal, round, and reactive to light. Right eye exhibits no discharge. Left eye exhibits no discharge. No scleral icterus.  Neck: Normal range of motion. Neck supple. No JVD present. No tracheal deviation present. No thyromegaly present.  Cardiovascular: Normal rate, regular rhythm, normal heart sounds and intact distal pulses.  Exam reveals no gallop and no friction rub.   No murmur heard. Pulmonary/Chest: Effort normal and breath sounds normal. No stridor. No respiratory distress. He has no wheezes. He has no rales. He exhibits no tenderness.  Abdominal: Soft. Bowel sounds are normal. He exhibits no distension and no mass. There is no tenderness. There is no rebound and no guarding.  Musculoskeletal: Normal range of motion. He exhibits no edema or tenderness.  Lymphadenopathy:    He has no cervical adenopathy.  Neurological: He is alert and oriented to person, place, and time. He has normal reflexes. He displays normal reflexes. No cranial nerve deficit. He exhibits normal muscle tone. Coordination normal.  Skin: Skin is warm. No rash noted. He is not diaphoretic. No erythema. No pallor.  Psychiatric: He has a normal mood and affect. His behavior is normal. Judgment and thought content normal.  Vitals reviewed. 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  Examination of his left foot shows normal dorsalis pedis pulses, normal posterior tibialis pulses, normal sensation  to 10 g monofilament and no evidence of ulcers or sores. He does have a dystrophic toenail on his left great toe        Assessment & Plan:  Routine general medical examination at a health care facility  Physical exam is completely normal. Patient will receive Prevnar 13 today. The remainder of his immunizations are up-to-date. His lab work is excellent. I will make no changes in his medications at this time. I do not feel that the patient requires a digital rectal exam given the fact he just had a colonoscopy last fall and his PSA is completely normal.

## 2014-10-17 ENCOUNTER — Telehealth: Payer: Self-pay | Admitting: Family Medicine

## 2014-10-17 NOTE — Telephone Encounter (Signed)
Patient would like referral to [facility] asap if possible  [phone]

## 2014-10-18 NOTE — Telephone Encounter (Signed)
Left message to return call 

## 2014-10-20 NOTE — Telephone Encounter (Signed)
Taken care of yesterday pt was at [facility] and needed a [facility] referral

## 2014-10-23 ENCOUNTER — Telehealth: Payer: Self-pay | Admitting: *Deleted

## 2014-10-23 NOTE — Telephone Encounter (Signed)
Submitted humana referral thru [facility] for authorization on 10/18/14 to Dr. Rosanne Ashing with authorization [phone]  Requesting provider: Flonnie Hailstone  Treating provider: Rosanne Ashing  Number of visits: 6  Start Date:10/18/14  End Date:04/16/15  Dx:R19.7-Diarrhea  Copy has been faxed to Dr. Benson Norway office for review/record

## 2014-12-04 ENCOUNTER — Encounter: Payer: Self-pay | Admitting: Family Medicine

## 2015-01-03 ENCOUNTER — Telehealth: Payer: Self-pay | Admitting: *Deleted

## 2015-01-03 NOTE — Telephone Encounter (Signed)
Submitted humana referral thru [facility] for authorization to Dr. 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 Ellie Lunch office for review

## 2015-04-17 DIAGNOSIS — L308 Other specified dermatitis: Secondary | ICD-10-CM | POA: Diagnosis not present

## 2015-04-17 DIAGNOSIS — R231 Pallor: Secondary | ICD-10-CM | POA: Diagnosis not present

## 2015-04-30 ENCOUNTER — Other Ambulatory Visit: Payer: Self-pay | Admitting: Ophthalmology

## 2015-04-30 DIAGNOSIS — H1189 Other specified disorders of conjunctiva: Secondary | ICD-10-CM | POA: Diagnosis not present

## 2015-04-30 DIAGNOSIS — D487 Neoplasm of uncertain behavior of other specified sites: Secondary | ICD-10-CM | POA: Diagnosis not present

## 2015-06-08 ENCOUNTER — Encounter: Payer: Self-pay | Admitting: Family Medicine

## 2015-06-08 ENCOUNTER — Ambulatory Visit (INDEPENDENT_AMBULATORY_CARE_PROVIDER_SITE_OTHER): Payer: PPO | Admitting: Family Medicine

## 2015-06-08 VITALS — BP 130/76 | HR 76 | Temp 97.9°F | Resp 18 | Ht 70.0 in | Wt 158.0 lb

## 2015-06-08 DIAGNOSIS — F411 Generalized anxiety disorder: Secondary | ICD-10-CM

## 2015-06-08 MED ORDER — ALPRAZOLAM 0.5 MG PO TABS: 0.5000 mg | ORAL_TABLET | Freq: Three times a day (TID) | ORAL | Status: DC | PRN

## 2015-06-08 NOTE — Progress Notes (Signed)
   Subjective:    Patient ID: Eugene [name], male    DOB: 12-02-1931, 82 y.o.   MRN: [id number]  HPI The patient is requesting a refill on Xanax. He occasionally has anxiety. He uses one half of a 0.5 mg tablet when he has an anxiety attack and it calms his symptoms down immediately. I gave him a prescription for 30 tablets and 2011 and he just now ran out of them. He uses it sparingly. He denies any chest pain. He denies any shortness of breath. He denies any dyspnea on exertion. He denies any depression. He does have some mild insomnia. Past Medical History  Diagnosis Date  . History of stomach ulcers   . Collagenous colitis     Dr. Festus Holts   Past Surgical History  Procedure Laterality Date  . Total knee arthroplasty    . Leg amputation      Right Below knee  . Back surgery    . Stomach surgery      Billroth II gastrojejunostomy   Current Outpatient Prescriptions on File Prior to Visit  Medication Sig Dispense Refill  . BIOTIN 5000 PO Take by mouth.    . cholecalciferol (VITAMIN D) 1000 UNITS tablet Take 1,000 Units by mouth daily.    . fish oil-omega-3 fatty acids 1000 MG capsule Take 1 g by mouth daily.     No current facility-administered medications on file prior to visit.   Allergies  Allergen Reactions  . Aleve [Naproxen Sodium] Nausea And Vomiting    No problem with other NSAIDs  . Penicillins     Diarrhea    Social History   Social History  . Marital Status: Married    Spouse Name: N/A  . Number of Children: N/A  . Years of Education: N/A   Occupational History  . Not on file.   Social History Main Topics  . Smoking status: Never Smoker   . Smokeless tobacco: Never Used  . Alcohol Use: Yes     Comment: occasional  . Drug Use: No  . Sexual Activity: Yes     Comment: married   Other Topics Concern  . Not on file   Social History Narrative      Review of Systems  All other systems reviewed and are negative.      Objective:   Physical  Exam  Constitutional: He is oriented to person, place, and time.  Cardiovascular: Normal rate, regular rhythm and normal heart sounds.   Pulmonary/Chest: Effort normal and breath sounds normal. No respiratory distress. He has no wheezes. He has no rales.  Abdominal: Soft. Bowel sounds are normal.  Neurological: He is alert and oriented to person, place, and time. No cranial nerve deficit. He exhibits normal muscle tone. Coordination normal.  Psychiatric: He has a normal mood and affect. His behavior is normal. Judgment and thought content normal.  Vitals reviewed.         Assessment & Plan:  Patient has mild occasional anxiety. I will gladly refill her Xanax 0.5 mg tablets. He can take 1 tablet up to every 8 hours as needed. He uses these sparingly and will likely last him more than a year

## 2015-06-21 DIAGNOSIS — L570 Actinic keratosis: Secondary | ICD-10-CM | POA: Diagnosis not present

## 2015-06-21 DIAGNOSIS — D239 Other benign neoplasm of skin, unspecified: Secondary | ICD-10-CM | POA: Diagnosis not present

## 2015-07-20 ENCOUNTER — Ambulatory Visit (INDEPENDENT_AMBULATORY_CARE_PROVIDER_SITE_OTHER): Payer: PPO | Admitting: Family Medicine

## 2015-07-20 ENCOUNTER — Encounter: Payer: Self-pay | Admitting: Family Medicine

## 2015-07-20 VITALS — BP 122/76 | HR 88 | Temp 98.1°F | Resp 18 | Wt 158.0 lb

## 2015-07-20 DIAGNOSIS — R079 Chest pain, unspecified: Secondary | ICD-10-CM

## 2015-07-20 NOTE — Progress Notes (Signed)
Subjective:    Patient ID: Eugene [name], male    DOB: [birth year], 82 y.o.   MRN: [id number]  HPI Patient states that for the last 2 nights he has been awakened by dull pain in his left chest underneath his nipple. The pain does not radiate. It is not intense but it does keep him awake. It seems mainly to occur at night. There are no exacerbating or alleviating factors. However during the daytime he is not bothered by the pain. He is able to work and do his normal daily routine without shortness of breath without angina without nausea vomiting or diaphoresis. His EKG today shows normal sinus rhythm with no ischemia and no infarction and no concerning EKG changes. His vital signs are normal. He does state that he has a mild dull pain in his left chest but he is more concerned about taking medication help him sleep at night. He rates the pain as a 1 or 2 on a scale of 10. Is very atypical and unrelated to exertion however obviously he would be a high-risk patient mainly due to his age. I gave him a GI cocktail at approximately 910. By 920, the left-sided chest pain had subsided and the patient was asking to leave Past Medical History  Diagnosis Date  . History of stomach ulcers   . Collagenous colitis     Dr. Festus Holts   Past Surgical History  Procedure Laterality Date  . Total knee arthroplasty    . Leg amputation      Right Below knee  . Back surgery    . Stomach surgery      Billroth II gastrojejunostomy   Current Outpatient Prescriptions on File Prior to Visit  Medication Sig Dispense Refill  . ALPRAZolam (XANAX) 0.5 MG tablet Take 1 tablet (0.5 mg total) by mouth 3 (three) times daily as needed for anxiety. 30 tablet 0  . fish oil-omega-3 fatty acids 1000 MG capsule Take 1 g by mouth daily.    Marland Kitchen BIOTIN 5000 PO Take by mouth. Reported on 07/20/2015    . cholecalciferol (VITAMIN D) 1000 UNITS tablet Take 1,000 Units by mouth daily. Reported on 07/20/2015     No current  facility-administered medications on file prior to visit.   Allergies  Allergen Reactions  . Aleve [Naproxen Sodium] Nausea And Vomiting    No problem with other NSAIDs  . Penicillins     Diarrhea    Social History   Social History  . Marital Status: Married    Spouse Name: N/A  . Number of Children: N/A  . Years of Education: N/A   Occupational History  . Not on file.   Social History Main Topics  . Smoking status: Never Smoker   . Smokeless tobacco: Never Used  . Alcohol Use: Yes     Comment: occasional  . Drug Use: No  . Sexual Activity: Yes     Comment: married   Other Topics Concern  . Not on file   Social History Narrative      Review of Systems  All other systems reviewed and are negative.      Objective:   Physical Exam  Constitutional: He is oriented to person, place, and time. He appears well-developed and well-nourished. No distress.  HENT:  Mouth/Throat: Oropharynx is clear and moist. No oropharyngeal exudate.  Eyes: Conjunctivae are normal. Pupils are equal, round, and reactive to light.  Neck: Neck supple. No JVD present.  Cardiovascular: Normal rate, regular rhythm  and normal heart sounds.  Exam reveals no gallop and no friction rub.   No murmur heard. Pulmonary/Chest: Effort normal and breath sounds normal. No respiratory distress. He has no wheezes. He has no rales. He exhibits no tenderness.  Abdominal: Soft. Bowel sounds are normal. He exhibits no distension and no mass. There is no tenderness. There is no rebound and no guarding.  Musculoskeletal: He exhibits no edema.  Lymphadenopathy:    He has no cervical adenopathy.  Neurological: He is alert and oriented to person, place, and time. He has normal reflexes. He displays normal reflexes. No cranial nerve deficit. He exhibits normal muscle tone. Coordination normal.  Skin: He is not diaphoretic.  Psychiatric: He has a normal mood and affect. His behavior is normal. Judgment and thought  content normal.  Vitals reviewed.         Assessment & Plan:  Chest pain, unspecified chest pain type - Plan: EKG 12-Lead  Patient's chest pain is very atypical. His only risk factor would be his advanced age. There is no exertional component to his chest pain. There is no shortness of breath area there is no radiation or nausea or vomiting or diaphoresis. His EKG today is completely normal. His blood pressures excellent. The pain responded to a GI cocktail. This makes me believe it is likely gastrointestinal in etiology. I see no reason for him to go the emergency room at this time area and I'm going to start the patient on a proton pump inhibitor and will arrange outpatient cardiology consultation for a stress test based solely on his age simply to be thorough because of a high pretest probability and an 82 year old male. If the pain returns or intensifies I want him to go immediately to the emergency room

## 2015-10-03 ENCOUNTER — Encounter (INDEPENDENT_AMBULATORY_CARE_PROVIDER_SITE_OTHER): Payer: Self-pay

## 2015-10-03 ENCOUNTER — Ambulatory Visit (INDEPENDENT_AMBULATORY_CARE_PROVIDER_SITE_OTHER): Payer: PPO | Admitting: Cardiology

## 2015-10-03 ENCOUNTER — Encounter: Payer: Self-pay | Admitting: Cardiology

## 2015-10-03 VITALS — BP 130/80 | HR 68 | Ht 70.0 in | Wt 156.8 lb

## 2015-10-03 DIAGNOSIS — R079 Chest pain, unspecified: Secondary | ICD-10-CM

## 2015-10-03 NOTE — Patient Instructions (Signed)
Medication Instructions:  Your physician recommends that you continue on your current medications as directed. Please refer to the Current Medication list given to you today.  Labwork: None ordered.  Testing/Procedures: None ordered.  Follow-Up: Your physician recommends that you schedule a follow-up appointment as needed.   Any Other Special Instructions Will Be Listed Below (If Applicable).     If you need a refill on your cardiac medications before your next appointment, please call your pharmacy.   
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
## 2015-10-23 ENCOUNTER — Other Ambulatory Visit: Payer: Self-pay | Admitting: Family Medicine

## 2015-10-23 ENCOUNTER — Other Ambulatory Visit: Payer: PPO

## 2015-10-23 DIAGNOSIS — Z79899 Other long term (current) drug therapy: Secondary | ICD-10-CM

## 2015-10-23 DIAGNOSIS — Z125 Encounter for screening for malignant neoplasm of prostate: Secondary | ICD-10-CM

## 2015-10-23 DIAGNOSIS — F419 Anxiety disorder, unspecified: Secondary | ICD-10-CM

## 2015-10-23 DIAGNOSIS — Z0000 Encounter for general adult medical examination without abnormal findings: Secondary | ICD-10-CM

## 2015-10-23 LAB — LIPID PANEL
Cholesterol: 167 mg/dL (ref 125–200)
HDL: 70 mg/dL (ref >=40)
LDL Cholesterol: 81 mg/dL (ref <130)
Total CHOL/HDL Ratio: 2.4 Ratio (ref <=5.0)
Triglycerides: 79 mg/dL (ref <150)
VLDL: 16 mg/dL (ref <30)

## 2015-10-23 LAB — COMPLETE METABOLIC PANEL WITH GFR
ALT: 17 U/L (ref 9–46)
AST: 18 U/L (ref 10–35)
Albumin: 3.7 g/dL (ref 3.6–5.1)
Alkaline Phosphatase: 63 U/L (ref 40–115)
BUN: 14 mg/dL (ref 7–25)
CO2: 27 mmol/L (ref 20–31)
Calcium: 9.1 mg/dL (ref 8.6–10.3)
Chloride: 104 mmol/L (ref 98–110)
Creat: 0.94 mg/dL (ref 0.70–1.11)
GFR, Est African American: 87 mL/min (ref >=60)
GFR, Est Non African American: 75 mL/min (ref >=60)
Glucose, Bld: 80 mg/dL (ref 70–99)
Potassium: 4.3 mmol/L (ref 3.5–5.3)
Sodium: 141 mmol/L (ref 135–146)
Total Bilirubin: 0.9 mg/dL (ref 0.2–1.2)
Total Protein: 5.8 g/dL — ABNORMAL LOW (ref 6.1–8.1)

## 2015-10-23 LAB — CBC WITH DIFFERENTIAL/PLATELET
Basophils Absolute: 60 cells/uL (ref 0–200)
Basophils Relative: 1 %
Eosinophils Absolute: 540 cells/uL — ABNORMAL HIGH (ref 15–500)
Eosinophils Relative: 9 %
HCT: 44.3 % (ref 38.5–50.0)
Hemoglobin: 14.6 g/dL (ref 13.0–17.0)
Lymphocytes Relative: 30 %
Lymphs Abs: 1800 cells/uL (ref 850–3900)
MCH: 31.9 pg (ref 27.0–33.0)
MCHC: 33 g/dL (ref 32.0–36.0)
MCV: 96.9 fL (ref 80.0–100.0)
MPV: 9.3 fL (ref 7.5–12.5)
Monocytes Absolute: 600 cells/uL (ref 200–950)
Monocytes Relative: 10 %
Neutro Abs: 3000 cells/uL (ref 1500–7800)
Neutrophils Relative %: 50 %
Platelets: 227 10*3/uL (ref 140–400)
RBC: 4.57 MIL/uL (ref 4.20–5.80)
RDW: 13.2 % (ref 11.0–15.0)
WBC: 6 10*3/uL (ref 3.8–10.8)

## 2015-10-23 LAB — PSA: PSA: 1.4 ng/mL (ref <=4.0)

## 2015-10-24 LAB — TSH: TSH: 1.6 mIU/L (ref 0.40–4.50)

## 2015-10-25 ENCOUNTER — Encounter: Payer: Self-pay | Admitting: Family Medicine

## 2015-10-25 ENCOUNTER — Ambulatory Visit (INDEPENDENT_AMBULATORY_CARE_PROVIDER_SITE_OTHER): Payer: PPO | Admitting: Family Medicine

## 2015-10-25 VITALS — BP 110/60 | HR 78 | Temp 97.7°F | Resp 18 | Ht 70.0 in | Wt 158.0 lb

## 2015-10-25 DIAGNOSIS — Z0000 Encounter for general adult medical examination without abnormal findings: Secondary | ICD-10-CM | POA: Diagnosis not present

## 2015-10-25 NOTE — Progress Notes (Signed)
Subjective:    Patient ID: Eugene [name], male    DOB: January 31, 1932, 82 y.o.   MRN: [id number]  HPI Patient is here today for complete physical exam. He had a colonoscopy performed in September 2015 which revealed collagenous colitis. He was treated with budesonide with significant improvement however he cannot afford that medication. . Patient has had Pneumovax 23 as well as his tetanus vaccine and the shingles vaccine. Immunizations are upt o date.   Immunization History  Administered Date(s) Administered  . Influenza,inj,Quad PF,36+ Mos 12/28/2012, 01/17/2014  . Pneumococcal Conjugate-13 09/21/2014  . Pneumococcal Polysaccharide-23 05/09/2002  . Tdap 07/29/2012  . Zoster 07/07/2006   His most recent lab work as listed below: Appointment on 10/23/2015  Component Date Value Ref Range Status  . Sodium 10/23/2015 141  135 - 146 mmol/L Final  . Potassium 10/23/2015 4.3  3.5 - 5.3 mmol/L Final  . Chloride 10/23/2015 104  98 - 110 mmol/L Final  . CO2 10/23/2015 27  20 - 31 mmol/L Final  . Glucose, Bld 10/23/2015 80  70 - 99 mg/dL Final  . BUN 10/23/2015 14  7 - 25 mg/dL Final  . Creat 10/23/2015 0.94  0.70 - 1.11 mg/dL Final   Comment:   For patients > or = 80 years of age: The upper reference limit for Creatinine is approximately 13% higher for people identified as African-American.     . Total Bilirubin 10/23/2015 0.9  0.2 - 1.2 mg/dL Final  . Alkaline Phosphatase 10/23/2015 63  40 - 115 U/L Final  . AST 10/23/2015 18  10 - 35 U/L Final  . ALT 10/23/2015 17  9 - 46 U/L Final  . Total Protein 10/23/2015 5.8* 6.1 - 8.1 g/dL Final  . Albumin 10/23/2015 3.7  3.6 - 5.1 g/dL Final  . Calcium 10/23/2015 9.1  8.6 - 10.3 mg/dL Final  . GFR, Est African American 10/23/2015 87  >=60 mL/min Final  . GFR, Est Non African American 10/23/2015 75  >=60 mL/min Final  . TSH 10/24/2015 1.60  0.40 - 4.50 mIU/L Final  . Cholesterol 10/23/2015 167  125 - 200 mg/dL Final  . Triglycerides 10/23/2015 79   <150 mg/dL Final  . HDL 10/23/2015 70  >=40 mg/dL Final  . Total CHOL/HDL Ratio 10/23/2015 2.4  <=5.0 Ratio Final  . VLDL 10/23/2015 16  <30 mg/dL Final  . LDL Cholesterol 10/23/2015 81  <130 mg/dL Final   Comment:   Total Cholesterol/HDL Ratio:CHD Risk                        Coronary Heart Disease Risk Table                                        Men       Women          1/2 Average Risk              3.4        3.3              Average Risk              5.0        4.4           2X Average Risk              9.6  7.1           3X Average Risk             23.4       11.0 Use the calculated Patient Ratio above and the CHD Risk table  to determine the patient's CHD Risk.   . WBC 10/23/2015 6.0  3.8 - 10.8 K/uL Final  . RBC 10/23/2015 4.57  4.20 - 5.80 MIL/uL Final  . Hemoglobin 10/23/2015 14.6  13.0 - 17.0 g/dL Final  . HCT 10/23/2015 44.3  38.5 - 50.0 % Final  . MCV 10/23/2015 96.9  80.0 - 100.0 fL Final  . MCH 10/23/2015 31.9  27.0 - 33.0 pg Final  . MCHC 10/23/2015 33.0  32.0 - 36.0 g/dL Final  . RDW 10/23/2015 13.2  11.0 - 15.0 % Final  . Platelets 10/23/2015 227  140 - 400 K/uL Final  . MPV 10/23/2015 9.3  7.5 - 12.5 fL Final  . Neutro Abs 10/23/2015 3000  1,500 - 7,800 cells/uL Final  . Lymphs Abs 10/23/2015 1800  850 - 3,900 cells/uL Final  . Monocytes Absolute 10/23/2015 600  200 - 950 cells/uL Final  . Eosinophils Absolute 10/23/2015 540* 15 - 500 cells/uL Final  . Basophils Absolute 10/23/2015 60  0 - 200 cells/uL Final  . Neutrophils Relative % 10/23/2015 50  % Final  . Lymphocytes Relative 10/23/2015 30  % Final  . Monocytes Relative 10/23/2015 10  % Final  . Eosinophils Relative 10/23/2015 9  % Final  . Basophils Relative 10/23/2015 1  % Final  . Smear Review 10/23/2015 Criteria for review not met   Final  . PSA 10/23/2015 1.4  <=4.0 ng/mL Final   Comment:   The total PSA value from this assay system is standardized against the WHO standard. The test result  will be approximately 20% lower when compared to the equimolar-standardized total PSA (Beckman Coulter). Comparison of serial PSA results should be interpreted with this fact in mind.   This test was performed using the Siemens chemiluminescent method. Values obtained from different assay methods cannot be used interchangeably. PSA levels, regardless of value, should not be interpreted as absolute evidence of the presence or absence of disease.   Effective October 22, 2015, Total PSA is being tested on the Siemens Centaur XP using chemiluminescence methodology. Re-baseline testing will be available until January 21, 2016 at no charge. If you have a patient that may require re-baselining, please order [id number] in addition to [id number].    Past Medical History:  Diagnosis Date  . Collagenous colitis    Dr. Festus Holts  . History of stomach ulcers    Past Surgical History:  Procedure Laterality Date  . BACK SURGERY    . LEG AMPUTATION     Right Below knee  . STOMACH SURGERY     Billroth II gastrojejunostomy  . TOTAL KNEE ARTHROPLASTY     Current Outpatient Prescriptions on File Prior to Visit  Medication Sig Dispense Refill  . ALPRAZolam (XANAX) 0.5 MG tablet Take 1 tablet (0.5 mg total) by mouth 3 (three) times daily as needed for anxiety. 30 tablet 0  . aspirin 81 MG tablet Take 81 mg by mouth at bedtime.    . cholecalciferol (VITAMIN D) 1000 UNITS tablet Take 1,000 Units by mouth daily. Reported on 07/20/2015    . fish oil-omega-3 fatty acids 1000 MG capsule Take 1 g by mouth daily.     No current facility-administered medications on file prior to visit.  Allergies  Allergen Reactions  . Aleve [Naproxen Sodium] Nausea And Vomiting    No problem with other NSAIDs  . Penicillins     Diarrhea    Social History   Social History  . Marital status: Married    Spouse name: N/A  . Number of children: N/A  . Years of education: N/A   Occupational History  . Not on file.    Social History Main Topics  . Smoking status: Never Smoker  . Smokeless tobacco: Never Used  . Alcohol use Yes     Comment: occasional  . Drug use: No  . Sexual activity: Yes     Comment: married   Other Topics Concern  . Not on file   Social History Narrative  . No narrative on file   Family History  Problem Relation Age of Onset  . Diabetes Sister   . Diabetes Brother   . Diabetes Sister   . Cancer Sister     breast      Review of Systems  All other systems reviewed and are negative.      Objective:   Physical Exam  Constitutional: He is oriented to person, place, and time. He appears well-developed and well-nourished. No distress.  HENT:  Head: Normocephalic and atraumatic.  Right Ear: External ear normal.  Left Ear: External ear normal.  Nose: Nose normal.  Mouth/Throat: Oropharynx is clear and moist. No oropharyngeal exudate.  Eyes: Conjunctivae and EOM are normal. Pupils are equal, round, and reactive to light. Right eye exhibits no discharge. Left eye exhibits no discharge. No scleral icterus.  Neck: Normal range of motion. Neck supple. No JVD present. No tracheal deviation present. No thyromegaly present.  Cardiovascular: Normal rate, regular rhythm, normal heart sounds and intact distal pulses.  Exam reveals no gallop and no friction rub.   No murmur heard. Pulmonary/Chest: Effort normal and breath sounds normal. No stridor. No respiratory distress. He has no wheezes. He has no rales. He exhibits no tenderness.  Abdominal: Soft. Bowel sounds are normal. He exhibits no distension and no mass. There is no tenderness. There is no rebound and no guarding.  Musculoskeletal: Normal range of motion. He exhibits no edema or tenderness.  Lymphadenopathy:    He has no cervical adenopathy.  Neurological: He is alert and oriented to person, place, and time. He has normal reflexes. No cranial nerve deficit. He exhibits normal muscle tone. Coordination normal.  Skin:  Skin is warm. No rash noted. He is not diaphoretic. No erythema. No pallor.  Psychiatric: He has a normal mood and affect. His behavior is normal. Judgment and thought content normal.  Vitals reviewed. 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 Examination of his left foot shows normal dorsalis pedis pulses, normal posterior tibialis pulses, normal sensation to 10 g monofilament and no evidence of ulcers or sores. He does have a dystrophic toenail on his left great toe        Assessment & Plan:  Gen med exam Physical exam is completely normal. Immunizations are up-to-date. Colonoscopy is up-to-date. I recommended against screening for prostate cancer however he had artery had a PSA drawn prior to his appointment. The PSA was excellent. I will not check this in the future. Physical exam is completely normal. Regular anticipatory guidance is provided. He denies any depression. He has not fallen. He is a low fall risk even though he has a history of a below the knee amputation.  He has no problem with ambulation

## 2015-11-28 ENCOUNTER — Telehealth: Payer: Self-pay | Admitting: *Deleted

## 2015-11-28 NOTE — Telephone Encounter (Signed)
Opened in error

## 2015-12-17 ENCOUNTER — Telehealth: Payer: Self-pay | Admitting: Family Medicine

## 2015-12-17 NOTE — Telephone Encounter (Signed)
Patient called states he needs liners for his prosthesis  he needs this for 2 he uses [facility] and he is willing to pick up prescription.  CB# [phone]

## 2015-12-19 NOTE — Telephone Encounter (Signed)
Spoke to pt and he needs order for Protective covers for prosethic leg - DX: right below knee amputation Z89.511  RX Order signed by Doreen Beam and left up front for pt to p/u - pt is aware can p/u after 2 today

## 2015-12-28 ENCOUNTER — Telehealth: Payer: Self-pay | Admitting: Family Medicine

## 2016-01-01 MED ORDER — TRAZODONE HCL 50 MG PO TABS: 50.0000 mg | ORAL_TABLET | Freq: Every evening | ORAL | 3 refills | Status: DC | PRN

## 2016-01-01 NOTE — Telephone Encounter (Signed)
Try trazodone 50 mg poqhs.

## 2016-01-01 NOTE — Telephone Encounter (Signed)
Pt called and states that he would like something to help him sleep through the night? He states that he is waking up every night around 2-3 am and is unable to go back to sleep.

## 2016-01-01 NOTE — Telephone Encounter (Signed)
Medication called/sent to requested pharmacy and pt aware 

## 2016-01-11 DIAGNOSIS — Z89511 Acquired absence of right leg below knee: Secondary | ICD-10-CM | POA: Diagnosis not present

## 2016-01-22 ENCOUNTER — Other Ambulatory Visit: Payer: Self-pay | Admitting: Family Medicine

## 2016-01-22 MED ORDER — TRAZODONE HCL 50 MG PO TABS: 50.0000 mg | ORAL_TABLET | Freq: Every evening | ORAL | 1 refills | Status: DC | PRN

## 2016-03-18 ENCOUNTER — Encounter: Payer: Self-pay | Admitting: Family Medicine

## 2016-03-18 ENCOUNTER — Ambulatory Visit (INDEPENDENT_AMBULATORY_CARE_PROVIDER_SITE_OTHER): Payer: PPO | Admitting: Family Medicine

## 2016-03-18 VITALS — BP 138/80 | HR 78 | Temp 98.2°F | Resp 16 | Ht 70.0 in | Wt 164.0 lb

## 2016-03-18 DIAGNOSIS — M6530 Trigger finger, unspecified finger: Secondary | ICD-10-CM | POA: Diagnosis not present

## 2016-03-18 NOTE — Progress Notes (Signed)
   Subjective:    Patient ID: Eugene [name], male    DOB: 1931-08-18, 83 y.o.   MRN: [id number]  HPI  States that for the last few weeks, he has experienced locking in his left third PIP joint. This only occurs when he makes a fist indicating a trigger finger of his left third MCP joint. There is some tenderness at the volar surface of the left third MCP joint. He denies any falls or injuries Past Medical History:  Diagnosis Date  . Collagenous colitis    Dr. Festus Holts  . History of stomach ulcers    Past Surgical History:  Procedure Laterality Date  . BACK SURGERY    . LEG AMPUTATION     Right Below knee  . STOMACH SURGERY     Billroth II gastrojejunostomy  . TOTAL KNEE ARTHROPLASTY     Current Outpatient Prescriptions on File Prior to Visit  Medication Sig Dispense Refill  . ALPRAZolam (XANAX) 0.5 MG tablet Take 1 tablet (0.5 mg total) by mouth 3 (three) times daily as needed for anxiety. 30 tablet 0  . aspirin 81 MG tablet Take 81 mg by mouth at bedtime.    . cholecalciferol (VITAMIN D) 1000 UNITS tablet Take 1,000 Units by mouth daily. Reported on 07/20/2015    . fish oil-omega-3 fatty acids 1000 MG capsule Take 1 g by mouth daily.    . traZODone (DESYREL) 50 MG tablet Take 1 tablet (50 mg total) by mouth at bedtime as needed for sleep. 90 tablet 1   No current facility-administered medications on file prior to visit.    Allergies  Allergen Reactions  . Aleve [Naproxen Sodium] Nausea And Vomiting    No problem with other NSAIDs  . Penicillins     Diarrhea    Social History   Social History  . Marital status: Married    Spouse name: N/A  . Number of children: N/A  . Years of education: N/A   Occupational History  . Not on file.   Social History Main Topics  . Smoking status: Never Smoker  . Smokeless tobacco: Never Used  . Alcohol use Yes     Comment: occasional  . Drug use: No  . Sexual activity: Yes     Comment: married   Other Topics Concern  . Not on  file   Social History Narrative  . No narrative on file      Review of Systems  All other systems reviewed and are negative.      Objective:   Physical Exam  Slightly tender to palpation over the volar aspect of the left third MCP joint. There is a locking of the PIP joint on the third finger with flexion and extension consistent with a trigger finger      Assessment & Plan:  Trigger finger, acquired Using sterile technique, I injected the volar surface of the left third MCP joint with a mixture of 1/2 mL of lidocaine without epinephrine and 1/2 mL of 40 mg per mL Kenalog as close to the flexor tendon as possible without actually injecting into the tendon. The patient tolerated the procedure well. Hopefully he will experience some relief with this.

## 2016-03-24 DIAGNOSIS — H52203 Unspecified astigmatism, bilateral: Secondary | ICD-10-CM | POA: Diagnosis not present

## 2016-03-24 DIAGNOSIS — Z961 Presence of intraocular lens: Secondary | ICD-10-CM | POA: Diagnosis not present

## 2016-04-22 DIAGNOSIS — L570 Actinic keratosis: Secondary | ICD-10-CM | POA: Diagnosis not present

## 2016-04-22 DIAGNOSIS — D229 Melanocytic nevi, unspecified: Secondary | ICD-10-CM | POA: Diagnosis not present

## 2016-04-22 DIAGNOSIS — L821 Other seborrheic keratosis: Secondary | ICD-10-CM | POA: Diagnosis not present

## 2016-06-03 DIAGNOSIS — D229 Melanocytic nevi, unspecified: Secondary | ICD-10-CM | POA: Diagnosis not present

## 2016-06-03 DIAGNOSIS — L570 Actinic keratosis: Secondary | ICD-10-CM | POA: Diagnosis not present

## 2016-06-03 DIAGNOSIS — L259 Unspecified contact dermatitis, unspecified cause: Secondary | ICD-10-CM | POA: Diagnosis not present

## 2016-08-20 DIAGNOSIS — A770 Spotted fever due to Rickettsia rickettsii: Secondary | ICD-10-CM | POA: Diagnosis not present

## 2016-08-20 DIAGNOSIS — T07XXXA Unspecified multiple injuries, initial encounter: Secondary | ICD-10-CM | POA: Diagnosis not present

## 2016-10-21 ENCOUNTER — Other Ambulatory Visit: Payer: PPO

## 2016-10-21 DIAGNOSIS — E7800 Pure hypercholesterolemia, unspecified: Secondary | ICD-10-CM

## 2016-10-21 DIAGNOSIS — Z79899 Other long term (current) drug therapy: Secondary | ICD-10-CM | POA: Diagnosis not present

## 2016-10-21 DIAGNOSIS — Z125 Encounter for screening for malignant neoplasm of prostate: Secondary | ICD-10-CM

## 2016-10-21 DIAGNOSIS — Z0000 Encounter for general adult medical examination without abnormal findings: Secondary | ICD-10-CM

## 2016-10-21 LAB — CBC WITH DIFFERENTIAL/PLATELET
Basophils Absolute: 0 cells/uL (ref 0–200)
Basophils Relative: 0 %
Eosinophils Absolute: 365 cells/uL (ref 15–500)
Eosinophils Relative: 5 %
HCT: 44.9 % (ref 38.5–50.0)
Hemoglobin: 14.8 g/dL (ref 13.0–17.0)
Lymphocytes Relative: 28 %
Lymphs Abs: 2044 cells/uL (ref 850–3900)
MCH: 32.9 pg (ref 27.0–33.0)
MCHC: 33 g/dL (ref 32.0–36.0)
MCV: 99.8 fL (ref 80.0–100.0)
MPV: 9 fL (ref 7.5–12.5)
Monocytes Absolute: 803 cells/uL (ref 200–950)
Monocytes Relative: 11 %
Neutro Abs: 4088 cells/uL (ref 1500–7800)
Neutrophils Relative %: 56 %
Platelets: 260 10*3/uL (ref 140–400)
RBC: 4.5 MIL/uL (ref 4.20–5.80)
RDW: 13.1 % (ref 11.0–15.0)
WBC: 7.3 10*3/uL (ref 3.8–10.8)

## 2016-10-22 LAB — LIPID PANEL
Cholesterol: 166 mg/dL (ref <200)
HDL: 68 mg/dL (ref >40)
LDL Cholesterol: 85 mg/dL (ref <100)
Total CHOL/HDL Ratio: 2.4 Ratio (ref <5.0)
Triglycerides: 63 mg/dL (ref <150)
VLDL: 13 mg/dL (ref <30)

## 2016-10-22 LAB — COMPREHENSIVE METABOLIC PANEL
ALT: 13 U/L (ref 9–46)
AST: 16 U/L (ref 10–35)
Albumin: 3.7 g/dL (ref 3.6–5.1)
Alkaline Phosphatase: 63 U/L (ref 40–115)
BUN: 10 mg/dL (ref 7–25)
CO2: 26 mmol/L (ref 20–32)
Calcium: 9.3 mg/dL (ref 8.6–10.3)
Chloride: 105 mmol/L (ref 98–110)
Creat: 0.84 mg/dL (ref 0.70–1.11)
Glucose, Bld: 84 mg/dL (ref 70–99)
Potassium: 4.8 mmol/L (ref 3.5–5.3)
Sodium: 140 mmol/L (ref 135–146)
Total Bilirubin: 1 mg/dL (ref 0.2–1.2)
Total Protein: 5.8 g/dL — ABNORMAL LOW (ref 6.1–8.1)

## 2016-10-22 LAB — PSA: PSA: 1.1 ng/mL (ref <=4.0)

## 2016-10-28 ENCOUNTER — Encounter: Payer: Self-pay | Admitting: Family Medicine

## 2016-10-28 ENCOUNTER — Ambulatory Visit (INDEPENDENT_AMBULATORY_CARE_PROVIDER_SITE_OTHER): Payer: PPO | Admitting: Family Medicine

## 2016-10-28 VITALS — BP 122/60 | HR 80 | Temp 97.7°F | Resp 16 | Ht 70.0 in | Wt 155.0 lb

## 2016-10-28 DIAGNOSIS — I351 Nonrheumatic aortic (valve) insufficiency: Secondary | ICD-10-CM | POA: Diagnosis not present

## 2016-10-28 DIAGNOSIS — Z0000 Encounter for general adult medical examination without abnormal findings: Secondary | ICD-10-CM | POA: Diagnosis not present

## 2016-10-28 NOTE — Progress Notes (Signed)
Subjective:    Patient ID: Eugene [name], male    DOB: 1931-09-01, 83 y.o.   MRN: [id number]  HPI Patient is here today for complete physical exam. He had a colonoscopy performed in September 2015 which revealed collagenous colitis. However, he is no longer under treatment for that condition and he denies any chronic diarrhea. Immunizations are up-to-date although he is questioning if he she received a booster for shingles (Shingrix).  Otherwise he is doing well and his review of systems is completely normal Immunization History  Administered Date(s) Administered  . Influenza,inj,Quad PF,36+ Mos 12/28/2012, 01/17/2014  . Influenza-Unspecified 01/15/2016  . Pneumococcal Conjugate-13 09/21/2014  . Pneumococcal Polysaccharide-23 05/09/2002  . Tdap 07/29/2012  . Zoster 07/07/2006   His most recent lab work as listed below: Lab on 10/21/2016  Component Date Value Ref Range Status  . WBC 10/21/2016 7.3  3.8 - 10.8 K/uL Final  . RBC 10/21/2016 4.50  4.20 - 5.80 MIL/uL Final  . Hemoglobin 10/21/2016 14.8  13.0 - 17.0 g/dL Final  . HCT 10/21/2016 44.9  38.5 - 50.0 % Final  . MCV 10/21/2016 99.8  80.0 - 100.0 fL Final  . MCH 10/21/2016 32.9  27.0 - 33.0 pg Final  . MCHC 10/21/2016 33.0  32.0 - 36.0 g/dL Final  . RDW 10/21/2016 13.1  11.0 - 15.0 % Final  . Platelets 10/21/2016 260  140 - 400 K/uL Final  . MPV 10/21/2016 9.0  7.5 - 12.5 fL Final  . Neutro Abs 10/21/2016 4088  1,500 - 7,800 cells/uL Final  . Lymphs Abs 10/21/2016 2044  850 - 3,900 cells/uL Final  . Monocytes Absolute 10/21/2016 803  200 - 950 cells/uL Final  . Eosinophils Absolute 10/21/2016 365  15 - 500 cells/uL Final  . Basophils Absolute 10/21/2016 0  0 - 200 cells/uL Final  . Neutrophils Relative % 10/21/2016 56  % Final  . Lymphocytes Relative 10/21/2016 28  % Final  . Monocytes Relative 10/21/2016 11  % Final  . Eosinophils Relative 10/21/2016 5  % Final  . Basophils Relative 10/21/2016 0  % Final  . Smear Review  10/21/2016 Criteria for review not met   Final  . Sodium 10/21/2016 140  135 - 146 mmol/L Final  . Potassium 10/21/2016 4.8  3.5 - 5.3 mmol/L Final  . Chloride 10/21/2016 105  98 - 110 mmol/L Final  . CO2 10/21/2016 26  20 - 32 mmol/L Final   Comment: ** Please note change in reference range(s). **     . Glucose, Bld 10/21/2016 84  70 - 99 mg/dL Final  . BUN 10/21/2016 10  7 - 25 mg/dL Final  . Creat 10/21/2016 0.84  0.70 - 1.11 mg/dL Final   Comment:   For patients > or = 81 years of age: The upper reference limit for Creatinine is approximately 13% higher for people identified as African-American.     . Total Bilirubin 10/21/2016 1.0  0.2 - 1.2 mg/dL Final  . Alkaline Phosphatase 10/21/2016 63  40 - 115 U/L Final  . AST 10/21/2016 16  10 - 35 U/L Final  . ALT 10/21/2016 13  9 - 46 U/L Final  . Total Protein 10/21/2016 5.8* 6.1 - 8.1 g/dL Final  . Albumin 10/21/2016 3.7  3.6 - 5.1 g/dL Final  . Calcium 10/21/2016 9.3  8.6 - 10.3 mg/dL Final  . Cholesterol 10/21/2016 166  <200 mg/dL Final  . Triglycerides 10/21/2016 63  <150 mg/dL Final  . HDL 10/21/2016  68  >40 mg/dL Final  . Total CHOL/HDL Ratio 10/21/2016 2.4  <5.0 Ratio Final  . VLDL 10/21/2016 13  <30 mg/dL Final  . LDL Cholesterol 10/21/2016 85  <100 mg/dL Final  . PSA 10/21/2016 1.1  <=4.0 ng/mL Final   Comment:   The total PSA value from this assay system is standardized against the WHO standard. The test result will be approximately 20% lower when compared to the equimolar-standardized total PSA ([employer] Coul[employer]). Comparison of serial PSA results should be interpreted with this fact in mind.   This test was performed using the Siemens chemiluminescent method. Values obtained from different assay methods cannot be used interchangeably. PSA levels, regardless of value, should not be interpreted as absolute evidence of the presence or absence of disease.      Past Medical History:  Diagnosis Date  . Collagenous  colitis    Dr. Festus Holts  . History of stomach ulcers    Past Surgical History:  Procedure Laterality Date  . BACK SURGERY    . LEG AMPUTATION     Right Below knee  . STOMACH SURGERY     Billroth II gastrojejunostomy  . TOTAL KNEE ARTHROPLASTY     Current Outpatient Prescriptions on File Prior to Visit  Medication Sig Dispense Refill  . ALPRAZolam (XANAX) 0.5 MG tablet Take 1 tablet (0.5 mg total) by mouth 3 (three) times daily as needed for anxiety. 30 tablet 0  . aspirin 81 MG tablet Take 81 mg by mouth at bedtime.    . cholecalciferol (VITAMIN D) 1000 UNITS tablet Take 1,000 Units by mouth daily. Reported on 07/20/2015    . fish oil-omega-3 fatty acids 1000 MG capsule Take 1 g by mouth daily.    . traZODone (DESYREL) 50 MG tablet Take 1 tablet (50 mg total) by mouth at bedtime as needed for sleep. 90 tablet 1   No current facility-administered medications on file prior to visit.    Allergies  Allergen Reactions  . Aleve [Naproxen Sodium] Nausea And Vomiting    No problem with other NSAIDs  . Penicillins     Diarrhea    Social History   Social History  . Marital status: Married    Spouse name: N/A  . Number of children: N/A  . Years of education: N/A   Occupational History  . Not on file.   Social History Main Topics  . Smoking status: Never Smoker  . Smokeless tobacco: Never Used  . Alcohol use Yes     Comment: occasional  . Drug use: No  . Sexual activity: Yes     Comment: married   Other Topics Concern  . Not on file   Social History Narrative  . No narrative on file   Family History  Problem Relation Age of Onset  . Diabetes Sister   . Diabetes Brother   . Diabetes Sister   . Cancer Sister        breast      Review of Systems  All other systems reviewed and are negative.      Objective:   Physical Exam  Constitutional: He is oriented to person, place, and time. He appears well-developed and well-nourished. No distress.  HENT:  Head:  Normocephalic and atraumatic.  Right Ear: External ear normal.  Left Ear: External ear normal.  Nose: Nose normal.  Mouth/Throat: Oropharynx is clear and moist. No oropharyngeal exudate.  Eyes: Pupils are equal, round, and reactive to light. Conjunctivae and EOM are normal. Right  eye exhibits no discharge. Left eye exhibits no discharge. No scleral icterus.  Neck: Normal range of motion. Neck supple. No JVD present. No tracheal deviation present. No thyromegaly present.  Cardiovascular: Normal rate, regular rhythm and intact distal pulses.  Exam reveals no gallop and no friction rub.   Murmur heard.  Systolic murmur is present with a grade of 2/6  Pulmonary/Chest: Effort normal and breath sounds normal. No stridor. No respiratory distress. He has no wheezes. He has no rales. He exhibits no tenderness.  Abdominal: Soft. Bowel sounds are normal. He exhibits no distension and no mass. There is no tenderness. There is no rebound and no guarding.  Musculoskeletal: Normal range of motion. He exhibits no edema or tenderness.  Lymphadenopathy:    He has no cervical adenopathy.  Neurological: He is alert and oriented to person, place, and time. He has normal reflexes. No cranial nerve deficit. He exhibits normal muscle tone. Coordination normal.  Skin: Skin is warm. No rash noted. He is not diaphoretic. No erythema. No pallor.  Psychiatric: He has a normal mood and affect. His behavior is normal. Judgment and thought content normal.  Vitals reviewed. 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 Examination of his left foot shows normal dorsalis pedis pulses, normal posterior tibialis pulses, normal sensation to 10 g monofilament and no evidence of ulcers or sores. He does have a dystrophic toenail on his left great toe        Assessment & Plan:  Aortic ejection murmur - Plan: ECHOCARDIOGRAM COMPLETE  Routine general medical examination at a health  care facility  Patient has a prominent murmur that I did not appreciate last year on his exam. I will schedule the patient for an echocardiogram to evaluate further. It sounds consistent with aortic stenosis. Otherwise he is completely asymptomatic. He denies any chest pain shortness of breath dyspnea on exertion or presyncope. Given his age, he does not require a colonoscopy or prostate cancer screening although his PSA was completely normal. His lab work is outstanding. Follow-up in one year or as needed.  I will await the results of echocardiogram

## 2016-10-31 ENCOUNTER — Telehealth: Payer: Self-pay | Admitting: *Deleted

## 2016-10-31 NOTE — Telephone Encounter (Signed)
lvm for pt to call office to schedule echo per Dr. Dennard Schaumann.

## 2016-11-05 ENCOUNTER — Other Ambulatory Visit: Payer: Self-pay

## 2016-11-05 ENCOUNTER — Ambulatory Visit (HOSPITAL_COMMUNITY): Payer: PPO | Attending: Cardiology

## 2016-11-05 DIAGNOSIS — I351 Nonrheumatic aortic (valve) insufficiency: Secondary | ICD-10-CM

## 2016-11-05 DIAGNOSIS — I083 Combined rheumatic disorders of mitral, aortic and tricuspid valves: Secondary | ICD-10-CM | POA: Diagnosis not present

## 2016-12-10 DIAGNOSIS — R634 Abnormal weight loss: Secondary | ICD-10-CM | POA: Diagnosis not present

## 2016-12-10 DIAGNOSIS — K529 Noninfective gastroenteritis and colitis, unspecified: Secondary | ICD-10-CM | POA: Diagnosis not present

## 2017-01-08 ENCOUNTER — Ambulatory Visit (INDEPENDENT_AMBULATORY_CARE_PROVIDER_SITE_OTHER): Payer: PPO | Admitting: *Deleted

## 2017-01-08 DIAGNOSIS — Z23 Encounter for immunization: Secondary | ICD-10-CM

## 2017-01-12 DIAGNOSIS — K529 Noninfective gastroenteritis and colitis, unspecified: Secondary | ICD-10-CM | POA: Diagnosis not present

## 2017-04-07 DIAGNOSIS — L570 Actinic keratosis: Secondary | ICD-10-CM | POA: Diagnosis not present

## 2017-04-07 DIAGNOSIS — L918 Other hypertrophic disorders of the skin: Secondary | ICD-10-CM | POA: Diagnosis not present

## 2017-04-07 DIAGNOSIS — D229 Melanocytic nevi, unspecified: Secondary | ICD-10-CM | POA: Diagnosis not present

## 2017-04-21 ENCOUNTER — Encounter: Payer: Self-pay | Admitting: Family Medicine

## 2017-04-21 ENCOUNTER — Ambulatory Visit (INDEPENDENT_AMBULATORY_CARE_PROVIDER_SITE_OTHER): Payer: PPO | Admitting: Family Medicine

## 2017-04-21 VITALS — BP 132/62 | HR 83 | Temp 98.2°F | Resp 16 | Ht 70.0 in | Wt 149.0 lb

## 2017-04-21 DIAGNOSIS — K52839 Microscopic colitis, unspecified: Secondary | ICD-10-CM | POA: Diagnosis not present

## 2017-04-21 MED ORDER — CHOLESTYRAMINE 4 G PO PACK: 4.0000 g | PACK | Freq: Three times a day (TID) | ORAL | 12 refills | Status: DC

## 2017-04-21 NOTE — Progress Notes (Signed)
Subjective:    Patient ID: Eugene [name], male    DOB: 1931/07/21, 84 y.o.   MRN: [id number]  HPI Patient has a history of microscopic colitis confirmed on colonoscopy.  He has been treated with budesonide 9 mg tablets daily which control his diarrhea however financial he is unable to afford these.  He is run out of samples provided by his GI doctor.  He is here today to discuss other options to manage his chronic diarrhea.  He reports bowel movements 5 times a day on average.  Without the desonide they are loose and watery.  He denies any blood in his stool.  He denies any fevers or chills.  However he is starting to lose weight due to the diarrhea. Wt Readings from Last 3 Encounters:  04/21/17 149 lb (67.6 kg)  10/28/16 155 lb (70.3 kg)  03/18/16 164 lb (74.4 kg)     Past Medical History:  Diagnosis Date  . Collagenous colitis    Dr. Festus Holts  . History of stomach ulcers    Past Surgical History:  Procedure Laterality Date  . BACK SURGERY    . LEG AMPUTATION     Right Below knee  . STOMACH SURGERY     Billroth II gastrojejunostomy  . TOTAL KNEE ARTHROPLASTY     Current Outpatient Medications on File Prior to Visit  Medication Sig Dispense Refill  . ALPRAZolam (XANAX) 0.5 MG tablet Take 1 tablet (0.5 mg total) by mouth 3 (three) times daily as needed for anxiety. 30 tablet 0  . aspirin 81 MG tablet Take 81 mg by mouth at bedtime.    . cholecalciferol (VITAMIN D) 1000 UNITS tablet Take 1,000 Units by mouth daily. Reported on 07/20/2015    . fish oil-omega-3 fatty acids 1000 MG capsule Take 1 g by mouth daily.    . traZODone (DESYREL) 50 MG tablet Take 1 tablet (50 mg total) by mouth at bedtime as needed for sleep. 90 tablet 1   No current facility-administered medications on file prior to visit.    Allergies  Allergen Reactions  . Aleve [Naproxen Sodium] Nausea And Vomiting    No problem with other NSAIDs  . Penicillins     Diarrhea    Social History   Socioeconomic  History  . Marital status: Married    Spouse name: Not on file  . Number of children: Not on file  . Years of education: Not on file  . Highest education level: Not on file  Social Needs  . Financial resource strain: Not on file  . Food insecurity - worry: Not on file  . Food insecurity - inability: Not on file  . Transportation needs - medical: Not on file  . Transportation needs - non-medical: Not on file  Occupational History  . Not on file  Tobacco Use  . Smoking status: Never Smoker  . Smokeless tobacco: Never Used  Substance and Sexual Activity  . Alcohol use: Yes    Comment: occasional  . Drug use: No  . Sexual activity: Yes    Comment: married  Other Topics Concern  . Not on file  Social History Narrative  . Not on file    Review of Systems  All other systems reviewed and are negative.      Objective:   Physical Exam  Cardiovascular: Normal rate, regular rhythm and normal heart sounds.  No murmur heard. Pulmonary/Chest: Effort normal and breath sounds normal. No respiratory distress. He has no wheezes. He  has no rales.  Abdominal: Soft. Bowel sounds are normal. He exhibits no distension. There is no tenderness. There is no rebound and no guarding.  Vitals reviewed.         Assessment & Plan:  Microscopic colitis, unspecified microscopic colitis type  I am concerned by the weight loss.  We discussed options including prednisone, mesalamine.  However the patient would like to try safer options.  Therefore I will start him on cholestyramine 4 g 3 times daily in addition to Imodium 1 tablet 3 times daily and recheck in 1 week to see if his diarrhea is improving.  Consider prednisone versus mesalamine patient is seeing no benefit over the next week on the new medication.  Monitor weight loss closely

## 2017-04-28 ENCOUNTER — Ambulatory Visit (INDEPENDENT_AMBULATORY_CARE_PROVIDER_SITE_OTHER): Payer: PPO | Admitting: Family Medicine

## 2017-04-28 VITALS — BP 130/66 | HR 70 | Temp 97.5°F | Resp 16 | Ht 70.0 in | Wt 155.0 lb

## 2017-04-28 DIAGNOSIS — K52831 Collagenous colitis: Secondary | ICD-10-CM

## 2017-04-28 DIAGNOSIS — K52839 Microscopic colitis, unspecified: Secondary | ICD-10-CM | POA: Diagnosis not present

## 2017-04-28 MED ORDER — ZOSTER VAC RECOMB ADJUVANTED 50 MCG/0.5ML IM SUSR: 0.5000 mL | Freq: Once | INTRAMUSCULAR | 1 refills | Status: AC

## 2017-04-28 NOTE — Progress Notes (Addendum)
Subjective:    Patient ID: Eugene [name], male    DOB: 1931/10/09, 84 y.o.   MRN: [id number]  HPI  04/21/17 Patient has a history of microscopic colitis confirmed on colonoscopy.  He has been treated with budesonide 9 mg tablets daily which control his diarrhea however financial he is unable to afford these.  He is run out of samples provided by his GI doctor.  He is here today to discuss other options to manage his chronic diarrhea.  He reports bowel movements 5 times a day on average.  Without the desonide they are loose and watery.  He denies any blood in his stool.  He denies any fevers or chills.  However he is starting to lose weight due to the diarrhea. Wt Readings from Last 3 Encounters:  04/28/17 155 lb (70.3 kg)  04/21/17 149 lb (67.6 kg)  10/28/16 155 lb (70.3 kg)  At that time, my plan was: I am concerned by the weight loss.  We discussed options including prednisone, mesalamine.  However the patient would like to try safer options.  Therefore I will start him on cholestyramine 4 g 3 times daily in addition to Imodium 1 tablet 3 times daily and recheck in 1 week to see if his diarrhea is improving.  Consider prednisone versus mesalamine patient is seeing no benefit over the next week on the new medication.  Monitor weight loss closely   04/28/17 His diarrhea is much better and has completely stopped.  In fact he is gained back the weight that he recently lost and is back to his baseline.Marland Kitchen  He denies melena, hematochezia, abdominal pain.  Feels much better.   Past Medical History:  Diagnosis Date  . Collagenous colitis    Dr. Festus Holts  . History of stomach ulcers    Past Surgical History:  Procedure Laterality Date  . BACK SURGERY    . LEG AMPUTATION     Right Below knee  . STOMACH SURGERY     Billroth II gastrojejunostomy  . TOTAL KNEE ARTHROPLASTY     Current Outpatient Medications on File Prior to Visit  Medication Sig Dispense Refill  . ALPRAZolam (XANAX) 0.5 MG  tablet Take 1 tablet (0.5 mg total) by mouth 3 (three) times daily as needed for anxiety. 30 tablet 0  . aspirin 81 MG tablet Take 81 mg by mouth at bedtime.    . cholecalciferol (VITAMIN D) 1000 UNITS tablet Take 1,000 Units by mouth daily. Reported on 07/20/2015    . cholestyramine (QUESTRAN) 4 GM/DOSE powder USE 1 SCOOP 3 TIMES A DAY WITH A MEAL  12  . fish oil-omega-3 fatty acids 1000 MG capsule Take 1 g by mouth daily.    . traZODone (DESYREL) 50 MG tablet Take 1 tablet (50 mg total) by mouth at bedtime as needed for sleep. 90 tablet 1   No current facility-administered medications on file prior to visit.    Allergies  Allergen Reactions  . Aleve [Naproxen Sodium] Nausea And Vomiting    No problem with other NSAIDs  . Penicillins     Diarrhea    Social History   Socioeconomic History  . Marital status: Married    Spouse name: Not on file  . Number of children: Not on file  . Years of education: Not on file  . Highest education level: Not on file  Social Needs  . Financial resource strain: Not on file  . Food insecurity - worry: Not on file  .  Food insecurity - inability: Not on file  . Transportation needs - medical: Not on file  . Transportation needs - non-medical: Not on file  Occupational History  . Not on file  Tobacco Use  . Smoking status: Never Smoker  . Smokeless tobacco: Never Used  Substance and Sexual Activity  . Alcohol use: Yes    Comment: occasional  . Drug use: No  . Sexual activity: Yes    Comment: married  Other Topics Concern  . Not on file  Social History Narrative  . Not on file    Review of Systems  All other systems reviewed and are negative.      Objective:   Physical Exam  Cardiovascular: Normal rate, regular rhythm and normal heart sounds.  No murmur heard. Pulmonary/Chest: Effort normal and breath sounds normal. No respiratory distress. He has no wheezes. He has no rales.  Abdominal: Soft. Bowel sounds are normal. He exhibits no  distension. There is no tenderness. There is no rebound and no guarding.  Vitals reviewed.         Assessment & Plan:  Collagenous colitis/microscopic coliitis  Wean down cholestyramine to bid and imodium to bid.  Continue to wean down gradually to the lowest necessary dose.  Monitor for constipation.    Patient has a history of a right below the knee amputation.  He wears a prosthesis on that leg for ambulation.  It is medically necessary for him to wear a prosthetic sock on the leg under the prosthesis for proper fit and function as well as to prevent sores and skin irritation.

## 2017-04-28 NOTE — Addendum Note (Signed)
Addended by: Shary Decamp B on: 04/28/2017 12:05 PM   Modules accepted: Orders

## 2017-05-22 ENCOUNTER — Ambulatory Visit (INDEPENDENT_AMBULATORY_CARE_PROVIDER_SITE_OTHER): Payer: PPO | Admitting: Family Medicine

## 2017-05-22 ENCOUNTER — Encounter: Payer: Self-pay | Admitting: Family Medicine

## 2017-05-22 VITALS — BP 120/70 | HR 96 | Temp 97.8°F | Resp 14 | Ht 70.0 in | Wt 157.0 lb

## 2017-05-22 DIAGNOSIS — Z89511 Acquired absence of right leg below knee: Secondary | ICD-10-CM

## 2017-05-22 NOTE — Progress Notes (Signed)
Subjective:    Patient ID: Eugene [name], male    DOB: 07-May-1931, 84 y.o.   MRN: [id number]  HPI  Patient has a history of a right below the knee amputation. He wears a prosthesis on this leg for ambulation. Under the prosthesis, he wears a prosthetic liner along with thin prosthetic socks. His last prosthetic liner was replaced approximately 15 months ago. There are now holes in the prosthetic liner in the location of his patella. Therefore he needs a new prosthetic liner as his current prosthetic liner is completely worn out. He also requires prosthetic socks, the thin variety, for proper functioning and fit of the prosthetic liner into the prosthesis. He is here today because his insurance will not pay for these without documentation in an office visit. Apparently our previous documentation was insufficient. Past Medical History:  Diagnosis Date  . Collagenous colitis    Dr. Festus Holts  . History of stomach ulcers    Past Surgical History:  Procedure Laterality Date  . BACK SURGERY    . LEG AMPUTATION     Right Below knee  . STOMACH SURGERY     Billroth II gastrojejunostomy  . TOTAL KNEE ARTHROPLASTY     Current Outpatient Medications on File Prior to Visit  Medication Sig Dispense Refill  . ALPRAZolam (XANAX) 0.5 MG tablet Take 1 tablet (0.5 mg total) by mouth 3 (three) times daily as needed for anxiety. 30 tablet 0  . aspirin 81 MG tablet Take 81 mg by mouth at bedtime.    . cholecalciferol (VITAMIN D) 1000 UNITS tablet Take 1,000 Units by mouth daily. Reported on 07/20/2015    . cholestyramine (QUESTRAN) 4 GM/DOSE powder USE 1 SCOOP 3 TIMES A DAY WITH A MEAL  12  . fish oil-omega-3 fatty acids 1000 MG capsule Take 1 g by mouth daily.    . traZODone (DESYREL) 50 MG tablet Take 1 tablet (50 mg total) by mouth at bedtime as needed for sleep. 90 tablet 1   No current facility-administered medications on file prior to visit.    Allergies  Allergen Reactions  . Aleve [Naproxen  Sodium] Nausea And Vomiting    No problem with other NSAIDs  . Penicillins     Diarrhea    Social History   Socioeconomic History  . Marital status: Married    Spouse name: Not on file  . Number of children: Not on file  . Years of education: Not on file  . Highest education level: Not on file  Social Needs  . Financial resource strain: Not on file  . Food insecurity - worry: Not on file  . Food insecurity - inability: Not on file  . Transportation needs - medical: Not on file  . Transportation needs - non-medical: Not on file  Occupational History  . Not on file  Tobacco Use  . Smoking status: Never Smoker  . Smokeless tobacco: Never Used  Substance and Sexual Activity  . Alcohol use: Yes    Comment: occasional  . Drug use: No  . Sexual activity: Yes    Comment: married  Other Topics Concern  . Not on file  Social History Narrative  . Not on file   Review of Systems  All other systems reviewed and are negative.      Objective:   Physical Exam  Cardiovascular: Normal rate, regular rhythm and normal heart sounds.  Pulmonary/Chest: Effort normal and breath sounds normal. No respiratory distress. He has no wheezes. He has  no rales.  Vitals reviewed.  Right below the knee amputation. Please see history of present illness for further information       Assessment & Plan:  History of right below knee amputation ([place])  I will fax this office note directly to his prosthetic company. Hopefully this will be sufficient. His prosthetic liners are completely worn out. The liner has a hole overlying the patella which can cause sores and blisters in the skin. He also requires new thin prosthetic socks which fit over the liner and allow proper fit into the prosthesis itself for proper functioning. His previous socks are also worn out. Hopefully this documentation is sufficient.  Patient would also like a copy of this office note so that hopefully this will not take so long in  the future. I apologized to the patient however I just became aware of the situation March 4.  I wrote an addendum to his most recent office visit 04/28/17 and faxed that to [facility] yesterday.  I just became aware that was insufficient.

## 2017-05-27 MED FILL — SHINGRIX 50 MCG SUS: 50 | 1 days supply | Qty: 1 | Fill #0

## 2017-06-15 DIAGNOSIS — Z89511 Acquired absence of right leg below knee: Secondary | ICD-10-CM | POA: Diagnosis not present

## 2017-06-17 ENCOUNTER — Ambulatory Visit (INDEPENDENT_AMBULATORY_CARE_PROVIDER_SITE_OTHER): Payer: PPO | Admitting: Physician Assistant

## 2017-06-17 ENCOUNTER — Encounter: Payer: Self-pay | Admitting: Physician Assistant

## 2017-06-17 VITALS — BP 134/70 | HR 114 | Temp 97.8°F | Resp 16 | Ht 70.0 in | Wt 154.0 lb

## 2017-06-17 DIAGNOSIS — S46211A Strain of muscle, fascia and tendon of other parts of biceps, right arm, initial encounter: Secondary | ICD-10-CM | POA: Diagnosis not present

## 2017-06-17 NOTE — Progress Notes (Signed)
    Patient ID: DACIAN ORRICO MRN: [id number], DOB: [birth year], 84 y.o. Date of Encounter: 06/17/2017, 3:18 PM    Chief Complaint:  Chief Complaint  Patient presents with  . pulled muscle in right arm    was working on tractor      HPI: 84 y.o. year old male presents with above.   Reports that this just happened about 2 hours ago.  Was using a wrench doing some work on his tractor with a lot of repetitive movement against resistance.  Never heard or felt any pop but while doing that, noticed a discomfort and a new/ different sensation in the right bicep region.  Right bicep region now with abnormal appearance.  Minimal discomfort.     Home Meds:   Outpatient Medications Prior to Visit  Medication Sig Dispense Refill  . ALPRAZolam (XANAX) 0.5 MG tablet Take 1 tablet (0.5 mg total) by mouth 3 (three) times daily as needed for anxiety. 30 tablet 0  . aspirin 81 MG tablet Take 81 mg by mouth at bedtime.    . cholecalciferol (VITAMIN D) 1000 UNITS tablet Take 1,000 Units by mouth daily. Reported on 07/20/2015    . cholestyramine (QUESTRAN) 4 GM/DOSE powder USE 1 SCOOP 3 TIMES A DAY WITH A MEAL  12  . fish oil-omega-3 fatty acids 1000 MG capsule Take 1 g by mouth daily.    . traZODone (DESYREL) 50 MG tablet Take 1 tablet (50 mg total) by mouth at bedtime as needed for sleep. 90 tablet 1   No facility-administered medications prior to visit.     Allergies:  Allergies  Allergen Reactions  . Aleve [Naproxen Sodium] Nausea And Vomiting    No problem with other NSAIDs  . Morphine And Related     dizzy  . Penicillins     Diarrhea       Review of Systems: See HPI for pertinent ROS. All other ROS negative.    Physical Exam: Blood pressure 134/70, pulse (!) 114, temperature 97.8 F (36.6 C), temperature source Oral, resp. rate 16, height 5\' 10"  (1.778 m), weight 69.9 kg (154 lb), SpO2 96 %., Body mass index is 22.1 kg/m. General:  WNWD WM. Appears in no acute distress. Neck: Supple.  No thyromegaly. No lymphadenopathy. Lungs: Clear bilaterally to auscultation without wheezes, rales, or rhonchi. Breathing is unlabored. Heart: Regular rhythm. No murmurs, rubs, or gallops. Msk:  Right Bicep with abnormal appearance. No ecchymosis seen.  Extremities/Skin: Warm and dry.  Neuro: Alert and oriented X 3. Moves all extremities spontaneously. Gait is normal. CNII-XII grossly in tact. Psych:  Responds to questions appropriately with a normal affect.     ASSESSMENT AND PLAN:  84 y.o. year old male with    1. Rupture of right biceps tendon, initial encounter I discussed with him that I am quite certain that he has ruptured his right biceps tendon.   Discussed that he needs evaluation by orthopedics.   He voices understanding and agrees.   He states that he has plans to go out of town tomorrow and has to keep those plans but returns this weekend so is available to go to orthopedics early next week so we will get it scheduled for early next week.  Have added comment to the referral order for the appointment to be early next week. - AMB referral to orthopedics   Signed, Olean Ree [place], [place], [facility] 06/17/2017 3:18 PM

## 2017-06-18 DIAGNOSIS — M66829 Spontaneous rupture of other tendons, unspecified upper arm: Secondary | ICD-10-CM | POA: Diagnosis not present

## 2017-06-18 DIAGNOSIS — M66821 Spontaneous rupture of other tendons, right upper arm: Secondary | ICD-10-CM | POA: Diagnosis not present

## 2017-07-30 MED FILL — SHINGRIX 50 MCG SUS: 50 | 1 days supply | Qty: 1 | Fill #1

## 2017-08-24 DIAGNOSIS — H52203 Unspecified astigmatism, bilateral: Secondary | ICD-10-CM | POA: Diagnosis not present

## 2017-08-24 DIAGNOSIS — Z961 Presence of intraocular lens: Secondary | ICD-10-CM | POA: Diagnosis not present

## 2017-08-24 DIAGNOSIS — H353132 Nonexudative age-related macular degeneration, bilateral, intermediate dry stage: Secondary | ICD-10-CM | POA: Diagnosis not present

## 2017-08-24 DIAGNOSIS — H35371 Puckering of macula, right eye: Secondary | ICD-10-CM | POA: Diagnosis not present

## 2017-09-28 DIAGNOSIS — H35033 Hypertensive retinopathy, bilateral: Secondary | ICD-10-CM | POA: Diagnosis not present

## 2017-09-28 DIAGNOSIS — H35412 Lattice degeneration of retina, left eye: Secondary | ICD-10-CM | POA: Diagnosis not present

## 2017-09-28 DIAGNOSIS — H35371 Puckering of macula, right eye: Secondary | ICD-10-CM | POA: Diagnosis not present

## 2017-09-28 DIAGNOSIS — H353132 Nonexudative age-related macular degeneration, bilateral, intermediate dry stage: Secondary | ICD-10-CM | POA: Diagnosis not present

## 2017-09-29 ENCOUNTER — Ambulatory Visit (INDEPENDENT_AMBULATORY_CARE_PROVIDER_SITE_OTHER): Payer: PPO | Admitting: Family Medicine

## 2017-09-29 ENCOUNTER — Encounter: Payer: Self-pay | Admitting: Family Medicine

## 2017-09-29 VITALS — BP 110/60 | HR 88 | Temp 97.8°F | Resp 16 | Ht 70.0 in | Wt 153.0 lb

## 2017-09-29 DIAGNOSIS — K52839 Microscopic colitis, unspecified: Secondary | ICD-10-CM | POA: Diagnosis not present

## 2017-09-29 MED ORDER — CHOLESTYRAMINE 4 GM/DOSE PO POWD: ORAL | 12 refills | Status: DC

## 2017-09-29 NOTE — Progress Notes (Signed)
Subjective:    Patient ID: Eugene [name], male    DOB: Jul 15, 1931, 84 y.o.   MRN: [id number]  HPI  04/21/17 Patient has a history of microscopic colitis confirmed on colonoscopy.  He has been treated with budesonide 9 mg tablets daily which control his diarrhea however financial he is unable to afford these.  He is run out of samples provided by his GI doctor.  He is here today to discuss other options to manage his chronic diarrhea.  He reports bowel movements 5 times a day on average.  Without the desonide they are loose and watery.  He denies any blood in his stool.  He denies any fevers or chills.  However he is starting to lose weight due to the diarrhea. Wt Readings from Last 3 Encounters:  09/29/17 153 lb (69.4 kg)  06/17/17 154 lb (69.9 kg)  05/22/17 157 lb (71.2 kg)  At that time, my plan was: I am concerned by the weight loss.  We discussed options including prednisone, mesalamine.  However the patient would like to try safer options.  Therefore I will start him on cholestyramine 4 g 3 times daily in addition to Imodium 1 tablet 3 times daily and recheck in 1 week to see if his diarrhea is improving.  Consider prednisone versus mesalamine patient is seeing no benefit over the next week on the new medication.  Monitor weight loss closely   04/28/17 His diarrhea is much better and has completely stopped.  In fact he is gained back the weight that he recently lost and is back to his baseline.Marland Kitchen  He denies melena, hematochezia, abdominal pain.  Feels much better.  At that time, my plan was: Wean down cholestyramine to bid and imodium to bid.  Continue to wean down gradually to the lowest necessary dose.  Monitor for constipation.    Patient has a history of a right below the knee amputation.  He wears a prosthesis on that leg for ambulation.  It is medically necessary for him to wear a prosthetic sock on the leg under the prosthesis for proper fit and function as well as to prevent sores and  skin irritation.    09/29/17 Patient presents today requesting a referral to GI.  Previously he had seen Dr. Benson Norway.  He is now requesting a second opinion with Dr. Hilarie Fredrickson.  He has a history of microscopic colitis/collagenous colitis confirmed on colonoscopy.  His diarrhea response to budesonide however he is unable to afford these tablets financially due to cost.  Therefore he does not take the medication.  When he is not taking the medication he has numerous watery bowel movements per day.  He denies any blood in his stool.  He denies any melena.  He denies any nausea or vomiting.  Denies fever.  I asked the patient if he was taking the Imodium or the cholestyramine as I have prescribed in February.  At that time, his diarrhea had improved dramatically and almost completely stopped.  Patient states that he forgot to take the medication and has stopped them.   Past Medical History:  Diagnosis Date  . Collagenous colitis    Dr. Festus Holts  . History of stomach ulcers    Past Surgical History:  Procedure Laterality Date  . BACK SURGERY    . LEG AMPUTATION     Right Below knee  . STOMACH SURGERY     Billroth II gastrojejunostomy  . TOTAL KNEE ARTHROPLASTY     Current Outpatient  Medications on File Prior to Visit  Medication Sig Dispense Refill  . ALPRAZolam (XANAX) 0.5 MG tablet Take 1 tablet (0.5 mg total) by mouth 3 (three) times daily as needed for anxiety. 30 tablet 0  . aspirin 81 MG tablet Take 81 mg by mouth at bedtime.    . cholecalciferol (VITAMIN D) 1000 UNITS tablet Take 1,000 Units by mouth daily. Reported on 07/20/2015    . fish oil-omega-3 fatty acids 1000 MG capsule Take 1 g by mouth daily.    . traZODone (DESYREL) 50 MG tablet Take 1 tablet (50 mg total) by mouth at bedtime as needed for sleep. 90 tablet 1   No current facility-administered medications on file prior to visit.    Allergies  Allergen Reactions  . Aleve [Naproxen Sodium] Nausea And Vomiting    No problem with  other NSAIDs  . Morphine And Related     dizzy  . Penicillins     Diarrhea    Social History   Socioeconomic History  . Marital status: Married    Spouse name: Not on file  . Number of children: Not on file  . Years of education: Not on file  . Highest education level: Not on file  Occupational History  . Not on file  Social Needs  . Financial resource strain: Not on file  . Food insecurity:    Worry: Not on file    Inability: Not on file  . Transportation needs:    Medical: Not on file    Non-medical: Not on file  Tobacco Use  . Smoking status: Never Smoker  . Smokeless tobacco: Never Used  Substance and Sexual Activity  . Alcohol use: Yes    Comment: occasional  . Drug use: No  . Sexual activity: Yes    Comment: married  Lifestyle  . Physical activity:    Days per week: Not on file    Minutes per session: Not on file  . Stress: Not on file  Relationships  . Social connections:    Talks on phone: Not on file    Gets together: Not on file    Attends religious service: Not on file    Active member of club or organization: Not on file    Attends meetings of clubs or organizations: Not on file    Relationship status: Not on file  . Intimate partner violence:    Fear of current or ex partner: Not on file    Emotionally abused: Not on file    Physically abused: Not on file    Forced sexual activity: Not on file  Other Topics Concern  . Not on file  Social History Narrative  . Not on file    Review of Systems  All other systems reviewed and are negative.      Objective:   Physical Exam  Cardiovascular: Normal rate, regular rhythm and normal heart sounds.  No murmur heard. Pulmonary/Chest: Effort normal and breath sounds normal. No respiratory distress. He has no wheezes. He has no rales.  Abdominal: Soft. Bowel sounds are normal. He exhibits no distension. There is no tenderness. There is no rebound and no guarding.  Vitals reviewed.           Assessment & Plan:  Microscopic colitis, unspecified microscopic colitis type - Plan: Ambulatory referral to Gastroenterology  I explained the patient he has a chronic medical condition that without therapy will likely have exacerbations.  At the present time he is not taking any  medication to control his microscopic colitis.  He is unable to afford the budesonide.  He is not taking cholestyramine.  He is not taking Imodium.  I suggested that he try a combination of the cholestyramine and/or Imodium to help manage the diarrhea.  He was still like a second opinion with GI which I will happily arrange for him.  He would also like to meet with a new gastroenterologist.  He specifically request Dr. Hilarie Fredrickson.  I will certainly place the consult.  However I did encourage him to try a combination of cholestyramine 1-2 times a day with Imodium as needed for breakthrough diarrhea.

## 2017-10-07 ENCOUNTER — Encounter (HOSPITAL_COMMUNITY)
Admission: EM | Disposition: A | Discharge status: Home / Self Care | Payer: Self-pay | Source: Home / Self Care | Attending: Cardiovascular Disease

## 2017-10-07 ENCOUNTER — Other Ambulatory Visit: Payer: Self-pay

## 2017-10-07 ENCOUNTER — Inpatient Hospital Stay (HOSPITAL_COMMUNITY)
Admission: EM | Admit: 2017-10-07 | Discharge: 2017-10-10 | DRG: 247 | Disposition: A | Discharge status: Home / Self Care | Payer: PPO | Attending: Cardiovascular Disease | Admitting: Cardiovascular Disease

## 2017-10-07 ENCOUNTER — Encounter (HOSPITAL_COMMUNITY): Payer: Self-pay | Admitting: *Deleted

## 2017-10-07 ENCOUNTER — Emergency Department (HOSPITAL_COMMUNITY): Payer: PPO

## 2017-10-07 DIAGNOSIS — Z885 Allergy status to narcotic agent status: Secondary | ICD-10-CM | POA: Diagnosis not present

## 2017-10-07 DIAGNOSIS — Z8711 Personal history of peptic ulcer disease: Secondary | ICD-10-CM

## 2017-10-07 DIAGNOSIS — Z79899 Other long term (current) drug therapy: Secondary | ICD-10-CM

## 2017-10-07 DIAGNOSIS — I2102 ST elevation (STEMI) myocardial infarction involving left anterior descending coronary artery: Secondary | ICD-10-CM | POA: Diagnosis not present

## 2017-10-07 DIAGNOSIS — Z880 Allergy status to penicillin: Secondary | ICD-10-CM | POA: Diagnosis not present

## 2017-10-07 DIAGNOSIS — I213 ST elevation (STEMI) myocardial infarction of unspecified site: Secondary | ICD-10-CM | POA: Diagnosis not present

## 2017-10-07 DIAGNOSIS — Z89511 Acquired absence of right leg below knee: Secondary | ICD-10-CM

## 2017-10-07 DIAGNOSIS — E785 Hyperlipidemia, unspecified: Secondary | ICD-10-CM | POA: Diagnosis present

## 2017-10-07 DIAGNOSIS — I255 Ischemic cardiomyopathy: Secondary | ICD-10-CM | POA: Diagnosis present

## 2017-10-07 DIAGNOSIS — Z886 Allergy status to analgesic agent status: Secondary | ICD-10-CM

## 2017-10-07 DIAGNOSIS — R0789 Other chest pain: Secondary | ICD-10-CM | POA: Diagnosis not present

## 2017-10-07 DIAGNOSIS — I5042 Chronic combined systolic (congestive) and diastolic (congestive) heart failure: Secondary | ICD-10-CM | POA: Diagnosis not present

## 2017-10-07 DIAGNOSIS — R072 Precordial pain: Secondary | ICD-10-CM | POA: Diagnosis not present

## 2017-10-07 DIAGNOSIS — I2510 Atherosclerotic heart disease of native coronary artery without angina pectoris: Secondary | ICD-10-CM | POA: Diagnosis not present

## 2017-10-07 DIAGNOSIS — Z7982 Long term (current) use of aspirin: Secondary | ICD-10-CM | POA: Diagnosis not present

## 2017-10-07 DIAGNOSIS — Z955 Presence of coronary angioplasty implant and graft: Secondary | ICD-10-CM

## 2017-10-07 HISTORY — PX: CORONARY STENT INTERVENTION: CATH118234

## 2017-10-07 HISTORY — PX: CORONARY/GRAFT ACUTE MI REVASCULARIZATION: CATH118305

## 2017-10-07 HISTORY — DX: Atherosclerotic heart disease of native coronary artery without angina pectoris: I25.10

## 2017-10-07 HISTORY — PX: LEFT HEART CATH AND CORONARY ANGIOGRAPHY: CATH118249

## 2017-10-07 HISTORY — DX: Hyperlipidemia, unspecified: E78.5

## 2017-10-07 HISTORY — DX: Ischemic cardiomyopathy: I25.5

## 2017-10-07 HISTORY — DX: Chronic combined systolic (congestive) and diastolic (congestive) heart failure: I50.42

## 2017-10-07 HISTORY — DX: Old myocardial infarction: I25.2

## 2017-10-07 LAB — LIPID PANEL
Cholesterol: 188 mg/dL (ref 0–200)
HDL: 66 mg/dL (ref >40)
LDL Cholesterol: 99 mg/dL (ref 0–99)
Total CHOL/HDL Ratio: 2.8 RATIO
Triglycerides: 113 mg/dL (ref <150)
VLDL: 23 mg/dL (ref 0–40)

## 2017-10-07 LAB — I-STAT TROPONIN, ED: Troponin i, poc: 0.25 ng/mL (ref 0.00–0.08)

## 2017-10-07 LAB — COMPREHENSIVE METABOLIC PANEL
ALT: 18 U/L (ref 0–44)
AST: 23 U/L (ref 15–41)
Albumin: 3.9 g/dL (ref 3.5–5.0)
Alkaline Phosphatase: 78 U/L (ref 38–126)
Anion gap: 10 (ref 5–15)
BUN: 9 mg/dL (ref 8–23)
CO2: 22 mmol/L (ref 22–32)
Calcium: 9.2 mg/dL (ref 8.9–10.3)
Chloride: 106 mmol/L (ref 98–111)
Creatinine, Ser: 0.95 mg/dL (ref 0.61–1.24)
GFR calc Af Amer: >60 mL/min (ref >60)
GFR calc non Af Amer: >60 mL/min (ref >60)
Glucose, Bld: 121 mg/dL — ABNORMAL HIGH (ref 70–99)
Potassium: 3.9 mmol/L (ref 3.5–5.1)
Sodium: 138 mmol/L (ref 135–145)
Total Bilirubin: 0.9 mg/dL (ref 0.3–1.2)
Total Protein: 6.5 g/dL (ref 6.5–8.1)

## 2017-10-07 LAB — PROTIME-INR
INR: 1.04
Prothrombin Time: 13.5 seconds (ref 11.4–15.2)

## 2017-10-07 LAB — MRSA PCR SCREENING: MRSA by PCR: NEGATIVE

## 2017-10-07 LAB — CBC
HCT: 45 % (ref 39.0–52.0)
Hemoglobin: 15.4 g/dL (ref 13.0–17.0)
MCH: 32.4 pg (ref 26.0–34.0)
MCHC: 34.2 g/dL (ref 30.0–36.0)
MCV: 94.7 fL (ref 78.0–100.0)
Platelets: 291 10*3/uL (ref 150–400)
RBC: 4.75 MIL/uL (ref 4.22–5.81)
RDW: 11.9 % (ref 11.5–15.5)
WBC: 9 10*3/uL (ref 4.0–10.5)

## 2017-10-07 LAB — APTT: aPTT: 34 seconds (ref 24–36)

## 2017-10-07 SURGERY — LEFT HEART CATH AND CORONARY ANGIOGRAPHY
Anesthesia: LOCAL

## 2017-10-07 MED ORDER — ENOXAPARIN SODIUM 40 MG/0.4ML ~~LOC~~ SOLN
40.0000 mg | SUBCUTANEOUS | Status: DC
  Administered 2017-10-08 – 2017-10-09 (×2): 40 mg via SUBCUTANEOUS
  Filled 2017-10-07 (×2): qty 0.4

## 2017-10-07 MED ORDER — PANTOPRAZOLE SODIUM 40 MG PO TBEC
40.0000 mg | DELAYED_RELEASE_TABLET | Freq: Every day | ORAL | Status: DC
  Administered 2017-10-07 – 2017-10-10 (×4): 40 mg via ORAL
  Filled 2017-10-07 (×4): qty 1

## 2017-10-07 MED ORDER — SODIUM CHLORIDE 0.9 % IV SOLN: 250.0000 mL | INTRAVENOUS | Status: DC | PRN

## 2017-10-07 MED ORDER — OMEGA-3-ACID ETHYL ESTERS 1 G PO CAPS
1.0000 g | ORAL_CAPSULE | Freq: Every day | ORAL | Status: DC
  Administered 2017-10-08 – 2017-10-10 (×3): 1 g via ORAL
  Filled 2017-10-07 (×3): qty 1

## 2017-10-07 MED ORDER — HEPARIN (PORCINE) IN NACL 1000-0.9 UT/500ML-% IV SOLN
INTRAVENOUS | Status: DC | PRN
  Administered 2017-10-07: 500 mL

## 2017-10-07 MED ORDER — VERAPAMIL HCL 2.5 MG/ML IV SOLN
INTRAVENOUS | Status: AC
  Filled 2017-10-07: qty 2

## 2017-10-07 MED ORDER — HEPARIN BOLUS VIA INFUSION
4000.0000 [IU] | Freq: Once | INTRAVENOUS | Status: DC
  Filled 2017-10-07: qty 4000

## 2017-10-07 MED ORDER — ATORVASTATIN CALCIUM 80 MG PO TABS
80.0000 mg | ORAL_TABLET | Freq: Every day | ORAL | Status: DC
  Administered 2017-10-08 – 2017-10-09 (×2): 80 mg via ORAL
  Filled 2017-10-07 (×2): qty 1

## 2017-10-07 MED ORDER — SODIUM CHLORIDE 0.9 % IV SOLN
INTRAVENOUS | Status: AC
  Administered 2017-10-07: 19:00:00 via INTRAVENOUS

## 2017-10-07 MED ORDER — VITAMIN D 1000 UNITS PO TABS
1000.0000 [IU] | ORAL_TABLET | Freq: Every day | ORAL | Status: DC
  Administered 2017-10-08 – 2017-10-10 (×3): 1000 [IU] via ORAL
  Filled 2017-10-07 (×3): qty 1

## 2017-10-07 MED ORDER — IOPAMIDOL (ISOVUE-370) INJECTION 76%
INTRAVENOUS | Status: AC
  Filled 2017-10-07: qty 125

## 2017-10-07 MED ORDER — ONDANSETRON HCL 4 MG/2ML IJ SOLN: 4.0000 mg | Freq: Four times a day (QID) | INTRAMUSCULAR | Status: DC | PRN

## 2017-10-07 MED ORDER — HEPARIN SODIUM (PORCINE) 1000 UNIT/ML IJ SOLN
INTRAMUSCULAR | Status: DC | PRN
  Administered 2017-10-07: 8000 [IU] via INTRAVENOUS

## 2017-10-07 MED ORDER — TICAGRELOR 90 MG PO TABS
ORAL_TABLET | ORAL | Status: AC
  Filled 2017-10-07: qty 2

## 2017-10-07 MED ORDER — SODIUM CHLORIDE 0.9% FLUSH
3.0000 mL | Freq: Two times a day (BID) | INTRAVENOUS | Status: DC
  Administered 2017-10-08 – 2017-10-09 (×5): 3 mL via INTRAVENOUS

## 2017-10-07 MED ORDER — ACETAMINOPHEN 325 MG PO TABS
650.0000 mg | ORAL_TABLET | ORAL | Status: DC | PRN
  Administered 2017-10-07 – 2017-10-08 (×3): 650 mg via ORAL
  Filled 2017-10-07 (×3): qty 2

## 2017-10-07 MED ORDER — LIDOCAINE HCL (PF) 1 % IJ SOLN
INTRAMUSCULAR | Status: DC | PRN
  Administered 2017-10-07: 5 mL

## 2017-10-07 MED ORDER — SODIUM CHLORIDE 0.9 % IV SOLN
INTRAVENOUS | Status: DC
  Administered 2017-10-07: 250 mL via INTRAVENOUS

## 2017-10-07 MED ORDER — NITROGLYCERIN 0.4 MG SL SUBL
0.4000 mg | SUBLINGUAL_TABLET | SUBLINGUAL | Status: DC | PRN
Start: 2017-10-07 — End: 2017-10-10
  Filled 2017-10-07 (×2): qty 1

## 2017-10-07 MED ORDER — PROSIGHT PO TABS
1.0000 | ORAL_TABLET | Freq: Every day | ORAL | Status: DC
  Administered 2017-10-08 – 2017-10-10 (×3): 1 via ORAL
  Filled 2017-10-07 (×3): qty 1

## 2017-10-07 MED ORDER — IOPAMIDOL (ISOVUE-370) INJECTION 76%
INTRAVENOUS | Status: AC
  Filled 2017-10-07: qty 50

## 2017-10-07 MED ORDER — HEPARIN SODIUM (PORCINE) 1000 UNIT/ML IJ SOLN
INTRAMUSCULAR | Status: AC
  Filled 2017-10-07: qty 1

## 2017-10-07 MED ORDER — NITROGLYCERIN 1 MG/10 ML FOR IR/CATH LAB
INTRA_ARTERIAL | Status: AC
  Filled 2017-10-07: qty 10

## 2017-10-07 MED ORDER — MIDAZOLAM HCL 2 MG/2ML IJ SOLN
INTRAMUSCULAR | Status: AC
  Filled 2017-10-07: qty 2

## 2017-10-07 MED ORDER — TRAZODONE HCL 50 MG PO TABS
50.0000 mg | ORAL_TABLET | Freq: Every evening | ORAL | Status: DC | PRN
Start: 2017-10-07 — End: 2017-10-10
  Administered 2017-10-07 – 2017-10-08 (×2): 50 mg via ORAL
  Filled 2017-10-07 (×2): qty 1

## 2017-10-07 MED ORDER — LIDOCAINE HCL (PF) 1 % IJ SOLN
INTRAMUSCULAR | Status: AC
  Filled 2017-10-07: qty 30

## 2017-10-07 MED ORDER — NITROGLYCERIN 1 MG/10 ML FOR IR/CATH LAB
INTRA_ARTERIAL | Status: DC | PRN
  Administered 2017-10-07 (×2): 150 ug via INTRACORONARY

## 2017-10-07 MED ORDER — CARVEDILOL 3.125 MG PO TABS
3.1250 mg | ORAL_TABLET | Freq: Two times a day (BID) | ORAL | Status: DC
  Administered 2017-10-08 – 2017-10-10 (×5): 3.125 mg via ORAL
  Filled 2017-10-07 (×5): qty 1

## 2017-10-07 MED ORDER — TICAGRELOR 90 MG PO TABS
ORAL_TABLET | ORAL | Status: DC | PRN
  Administered 2017-10-07: 180 mg via ORAL

## 2017-10-07 MED ORDER — OXYCODONE HCL 5 MG PO TABS
5.0000 mg | ORAL_TABLET | Freq: Four times a day (QID) | ORAL | Status: DC | PRN
  Administered 2017-10-08 (×3): 5 mg via ORAL
  Filled 2017-10-07 (×3): qty 1

## 2017-10-07 MED ORDER — TICAGRELOR 90 MG PO TABS
90.0000 mg | ORAL_TABLET | Freq: Two times a day (BID) | ORAL | Status: DC
  Administered 2017-10-08 – 2017-10-10 (×5): 90 mg via ORAL
  Filled 2017-10-07 (×5): qty 1

## 2017-10-07 MED ORDER — NITROGLYCERIN 0.4 MG SL SUBL
0.4000 mg | SUBLINGUAL_TABLET | SUBLINGUAL | Status: DC | PRN
  Administered 2017-10-07: 0.4 mg via SUBLINGUAL

## 2017-10-07 MED ORDER — ASPIRIN 81 MG PO CHEW
324.0000 mg | CHEWABLE_TABLET | Freq: Once | ORAL | Status: AC
  Administered 2017-10-07: 324 mg via ORAL
  Filled 2017-10-07: qty 4

## 2017-10-07 MED ORDER — VERAPAMIL HCL 2.5 MG/ML IV SOLN
INTRAVENOUS | Status: DC | PRN
  Administered 2017-10-07: 17:00:00 via INTRA_ARTERIAL

## 2017-10-07 MED ORDER — HEPARIN (PORCINE) IN NACL 1000-0.9 UT/500ML-% IV SOLN
INTRAVENOUS | Status: AC
  Filled 2017-10-07: qty 1000

## 2017-10-07 MED ORDER — ALPRAZOLAM 0.5 MG PO TABS: 0.5000 mg | ORAL_TABLET | Freq: Three times a day (TID) | ORAL | Status: DC | PRN

## 2017-10-07 MED ORDER — OMEGA-3 FATTY ACIDS 1000 MG PO CAPS: 1.0000 g | ORAL_CAPSULE | Freq: Every day | ORAL | Status: DC

## 2017-10-07 MED ORDER — HEPARIN (PORCINE) IN NACL 100-0.45 UNIT/ML-% IJ SOLN
800.0000 [IU]/h | INTRAMUSCULAR | Status: DC
  Filled 2017-10-07: qty 250

## 2017-10-07 MED ORDER — SODIUM CHLORIDE 0.9% FLUSH: 3.0000 mL | INTRAVENOUS | Status: DC | PRN

## 2017-10-07 MED ORDER — FENTANYL CITRATE (PF) 100 MCG/2ML IJ SOLN
INTRAMUSCULAR | Status: AC
  Filled 2017-10-07: qty 2

## 2017-10-07 MED ORDER — ASPIRIN EC 81 MG PO TBEC
81.0000 mg | DELAYED_RELEASE_TABLET | Freq: Every day | ORAL | Status: DC
  Administered 2017-10-08 – 2017-10-09 (×2): 81 mg via ORAL
  Filled 2017-10-07 (×2): qty 1

## 2017-10-07 MED ORDER — IOPAMIDOL (ISOVUE-370) INJECTION 76%
INTRAVENOUS | Status: DC | PRN
  Administered 2017-10-07: 115 mL

## 2017-10-07 SURGICAL SUPPLY — 18 items
BALLN SAPPHIRE 2.5X12 (BALLOONS) ×2
BALLN ~~LOC~~ EMERGE MR 3.25X20 (BALLOONS) ×2
BALLOON SAPPHIRE 2.5X12 (BALLOONS) IMPLANT
BALLOON ~~LOC~~ EMERGE MR 3.25X20 (BALLOONS) IMPLANT
CATH INFINITI JR4 5F (CATHETERS) ×1 IMPLANT
CATH LAUNCHER 6FR EBU3.5 (CATHETERS) ×1 IMPLANT
DEVICE RAD COMP TR BAND LRG (VASCULAR PRODUCTS) ×1 IMPLANT
ELECT DEFIB PAD ADLT CADENCE (PAD) ×1 IMPLANT
GLIDESHEATH SLEND SS 6F .021 (SHEATH) ×1 IMPLANT
GUIDEWIRE INQWIRE 1.5J.035X260 (WIRE) IMPLANT
INQWIRE 1.5J .035X260CM (WIRE) ×2
KIT ENCORE 26 ADVANTAGE (KITS) ×1 IMPLANT
KIT HEART LEFT (KITS) ×2 IMPLANT
PACK CARDIAC CATHETERIZATION (CUSTOM PROCEDURE TRAY) ×2 IMPLANT
STENT SYNERGY DES 3X38 (Permanent Stent) ×1 IMPLANT
TRANSDUCER W/STOPCOCK (MISCELLANEOUS) ×2 IMPLANT
TUBING CIL FLEX 10 FLL-RA (TUBING) ×2 IMPLANT
WIRE RUNTHROUGH .014X180CM (WIRE) ×1 IMPLANT

## 2017-10-07 NOTE — H&P (Signed)
Cardiology Admission History and Physical:   Patient ID: Eugene [name]; MRN: [id number]; DOB: Jun 04, 1931   Admission date: 10/07/2017  Primary Care Provider: Susy Frizzle, MD Primary Cardiologist: New Primary Electrophysiologist:  n/a  Chief Complaint:  Chest pain  Patient Profile:   Eugene [name] is a 84 y.o. male with a history of collagenous colitis, stomach ulcers and right below the knee amputation due to an injury who presented with acute onset of chest pain that started around 230 this afternoon while he was working in the yard.  History of Present Illness:   Eugene [name] is an 84 year old male with no prior cardiac history.  He presented with acute onset of chest pain around 230 this afternoon while he was working in the yard.  The pain was described as tightness and aching which was left-sided and did not radiate.  He has mild shortness of breath but no nausea, vomiting or diaphoresis.  He reports no previous similar symptoms in the past. He has been relatively healthy throughout his life and he is very active.  He is a lifelong non-smoker. Upon presentation, his EKG showed anterior, anterolateral and inferior ST elevation suggestive of LAD disease that likely wrapped around the apex.  When I evaluated him, he was still having chest pain.  He was given aspirin. Due to his symptoms and EKG changes I recommended proceeding with emergent cardiac catheterization and possible PCI.   Past Medical History:  Diagnosis Date  . Collagenous colitis    Dr. Festus Holts  . History of stomach ulcers     Past Surgical History:  Procedure Laterality Date  . BACK SURGERY    . LEG AMPUTATION     Right Below knee  . STOMACH SURGERY     Billroth II gastrojejunostomy  . TOTAL KNEE ARTHROPLASTY       Medications Prior to Admission: Prior to Admission medications   Medication Sig Start Date End Date Taking? Authorizing Provider  ALPRAZolam Duanne Moron) 0.5 MG tablet Take 1 tablet (0.5 mg  total) by mouth 3 (three) times daily as needed for anxiety. 06/08/15  Yes Susy Frizzle, MD  aspirin 81 MG tablet Take 81 mg by mouth at bedtime.   Yes [provider]  cholecalciferol (VITAMIN D) 1000 UNITS tablet Take 1,000 Units by mouth daily. Reported on 07/20/2015   Yes [provider]  cholestyramine Lucrezia Starch) 4 GM/DOSE powder USE 1 SCOOP 3 TIMES A DAY WITH A MEAL 09/29/17  Yes Susy Frizzle, MD  fish oil-omega-3 fatty acids 1000 MG capsule Take 1 g by mouth daily.   Yes [provider]  multivitamin-lutein (OCUVITE-LUTEIN) CAPS capsule Take 1 capsule by mouth daily.   Yes [provider]  traZODone (DESYREL) 50 MG tablet Take 1 tablet (50 mg total) by mouth at bedtime as needed for sleep. 01/22/16  Yes Susy Frizzle, MD     Allergies:    Allergies  Allergen Reactions  . Aleve [Naproxen Sodium] Nausea And Vomiting    No problem with other NSAIDs  . Morphine And Related     dizzy  . Penicillins     Diarrhea     Social History:   Social History   Socioeconomic History  . Marital status: Married    Spouse name: Not on file  . Number of children: Not on file  . Years of education: Not on file  . Highest education level: Not on file  Occupational History  . Not on file  Social  Needs  . Financial resource strain: Not on file  . Food insecurity:    Worry: Not on file    Inability: Not on file  . Transportation needs:    Medical: Not on file    Non-medical: Not on file  Tobacco Use  . Smoking status: Never Smoker  . Smokeless tobacco: Never Used  Substance and Sexual Activity  . Alcohol use: Yes    Comment: occasional  . Drug use: No  . Sexual activity: Yes    Comment: married  Lifestyle  . Physical activity:    Days per week: Not on file    Minutes per session: Not on file  . Stress: Not on file  Relationships  . Social connections:    Talks on phone: Not on file    Gets together: Not on file    Attends religious  service: Not on file    Active member of club or organization: Not on file    Attends meetings of clubs or organizations: Not on file    Relationship status: Not on file  . Intimate partner violence:    Fear of current or ex partner: Not on file    Emotionally abused: Not on file    Physically abused: Not on file    Forced sexual activity: Not on file  Other Topics Concern  . Not on file  Social History Narrative  . Not on file    Family History:   The patient's family history includes Cancer in his sister; Diabetes in his brother, sister, and sister.    ROS:  Please see the history of present illness.  All other ROS reviewed and negative.     Physical Exam/Data:   Vitals:   10/07/17 1751 10/07/17 1756 10/07/17 1801 10/07/17 1833  BP: 112/63 112/68 109/77 107/72  Pulse: 65 67 74 68  Resp: 16 16 15 17   Temp:    97.6 F (36.4 C)  TempSrc:    Oral  SpO2: 99% 97% 99% 97%  Weight:    149 lb 14.6 oz (68 kg)  Height:    5\' 10"  (1.778 m)   No intake or output data in the 24 hours ending 10/07/17 1846 Filed Weights   10/07/17 1638 10/07/17 1833  Weight: 153 lb (69.4 kg) 149 lb 14.6 oz (68 kg)   Body mass index is 21.51 kg/m.  General:  Well nourished, well developed, in no acute distress HEENT: normal Lymph: no adenopathy Neck: no JVD Endocrine:  No thryomegaly Vascular: No carotid bruits; FA pulses 2+ bilaterally without bruits  Cardiac:  normal S1, S2; RRR; no murmur  Lungs:  clear to auscultation bilaterally, no wheezing, rhonchi or rales  Abd: soft, nontender, no hepatomegaly  Ext: no edema Musculoskeletal:  No deformities, BUE and BLE strength normal and equal Skin: warm and dry  Neuro:  CNs 2-12 intact, no focal abnormalities noted Psych:  Normal affect    EKG:  The ECG that was done  was personally reviewed and demonstrates normal sinus rhythm with 2 mm of ST elevation from V3 to V6 and 1 mm of ST elevation in the inferior leads  Relevant CV  Studies:   Laboratory Data:  Chemistry Recent Labs  Lab 10/07/17 1628  NA 138  K 3.9  CL 106  CO2 22  GLUCOSE 121*  BUN 9  CREATININE 0.95  CALCIUM 9.2  GFRNONAA >60  GFRAA >60  ANIONGAP 10    Recent Labs  Lab 10/07/17 1628  PROT  6.5  ALBUMIN 3.9  AST 23  ALT 18  ALKPHOS 78  BILITOT 0.9   Hematology Recent Labs  Lab 10/07/17 1628  WBC 9.0  RBC 4.75  HGB 15.4  HCT 45.0  MCV 94.7  MCH 32.4  MCHC 34.2  RDW 11.9  PLT 291   Cardiac EnzymesNo results for input(s): TROPONINI in the last 168 hours.  Recent Labs  Lab 10/07/17 1631  TROPIPOC 0.25*    BNPNo results for input(s): BNP, PROBNP in the last 168 hours.  DDimer No results for input(s): DDIMER in the last 168 hours.  Radiology/Studies:  Dg Chest Portable 1 View  Result Date: 10/07/2017 CLINICAL DATA:  84 year old male with a history of left-sided chest pain EXAM: PORTABLE CHEST 1 VIEW COMPARISON:  06/10/2004 FINDINGS: Cardiomediastinal silhouette within normal limits. No evidence of central vascular congestion. No pneumothorax or pleural effusion. No confluent airspace disease. No displaced fracture. IMPRESSION: Negative for acute cardiopulmonary disease. Electronically Signed   By: Corrie Mckusick D.O.   On: 10/07/2017 16:49    Assessment and Plan:   1. Acute anterior ST elevation myocardial infarction: The EKG also shows some inferior ST elevation suggestive of anterior injury likely due to a wraparound LAD.  The patient was given aspirin.  I discussed options with the patient and his wife and recommended proceeding with emergent cardiac catheterization and possible PCI.  The patient will be given heparin.  Further recommendations to follow after cardiac cath. 2. Hyperlipidemia: Lipid profile requested.  He has started atorvastatin 80 mg daily. 3. History of stomach ulcers: Given the need for dual antiplatelet therapy, I am going to start the patient on a PPI.    For questions or updates, please  contact [facility] Please consult [email or web address] for contact info under Cardiology/STEMI.    Signed, Kathlyn Sacramento, MD  10/07/2017 6:46 PM

## 2017-10-07 NOTE — ED Triage Notes (Signed)
Pt in c/o left sided chest pain that started about an hour ago while doing work outside, states he was pulling up some weeds, denies shortness of breath or n/v, denies radiation of pain

## 2017-10-07 NOTE — Progress Notes (Signed)
ANTICOAGULATION CONSULT NOTE - Initial Consult  Pharmacy Consult for heparin Indication: chest pain/ACS  Allergies  Allergen Reactions  . Aleve [Naproxen Sodium] Nausea And Vomiting    No problem with other NSAIDs  . Morphine And Related     dizzy  . Penicillins     Diarrhea     Patient Measurements: Height: 5\' 10"  (177.8 cm) Weight: 153 lb (69.4 kg) IBW/kg (Calculated) : 73 Heparin Dosing Weight: 69 kg  Vital Signs: Temp: 98 F (36.7 C) (07/24 1621) Temp Source: Oral (07/24 1621) BP: 158/92 (07/24 1621) Pulse Rate: 68 (07/24 1621)  Labs: Recent Labs    10/07/17 1628  HGB 15.4  HCT 45.0  PLT 291  APTT 34  LABPROT 13.5  INR 1.04    CrCl cannot be calculated (Patient's most recent lab result is older than the maximum 21 days allowed.).  Assessment: CC/HPI: 84 yo m presenting with left side cp  PMH: colitis  Anticoag: none pta - iv hep for r/o acs   Heme/Onc: H&H 15.4/45, Plt 291   Goal of Therapy:  Heparin level 0.3-0.7 units/ml Monitor platelets by anticoagulation protocol: Yes   Plan:  Heparin bolus 4000 units x 1  Heparin gtt 800 units/hr Initial lvl 0200 Daily HL CBC F/U Cards plans  Levester Fresh, PharmD, BCPS, BCCCP Clinical Pharmacist [phone]  Please check AMION for all Jeff numbers  10/07/2017 5:15 PM

## 2017-10-07 NOTE — ED Provider Notes (Signed)
[facility] EMERGENCY DEPARTMENT Provider Note   CSN: [id number] Arrival date & time: 10/07/17  1617     History   Chief Complaint Chief Complaint  Patient presents with  . Code STEMI    HPI Eugene [name] is a 84 y.o. male.  HPI  84 year old male with chest pain.  Onset around 2:30 to 3:00 this afternoon after just finishing yard work.  He mowed the lawn and was pulling weeds right before the symptoms started.  Describes pressure in the left side of his chest.  No radiation.  Denies any other associated symptoms such as nausea, dyspnea or diaphoresis.  Pain has improved since onset but not completely resolved.  Denies any past cardiac history. He does not have a cardiologist. He took two 81mg  aspirin prior to arrival.   Past Medical History:  Diagnosis Date  . Collagenous colitis    Dr. Festus Holts  . History of stomach ulcers     Patient Active Problem List   Diagnosis Date Noted  . Collagenous colitis   . History of right below knee amputation ([place]) 10/13/2013  . Sinus tachycardia 06/04/2012  . Anxiety state, unspecified 06/04/2012    Past Surgical History:  Procedure Laterality Date  . BACK SURGERY    . LEG AMPUTATION     Right Below knee  . STOMACH SURGERY     [place] II gastrojejunostomy  . TOTAL KNEE ARTHROPLASTY          Home Medications    Prior to Admission medications   Medication Sig Start Date End Date Taking? Authorizing Provider  aspirin 81 MG tablet Take 81 mg by mouth at bedtime.   Yes [provider]  ALPRAZolam (XANAX) 0.5 MG tablet Take 1 tablet (0.5 mg total) by mouth 3 (three) times daily as needed for anxiety. 06/08/15   Susy Frizzle, MD  cholecalciferol (VITAMIN D) 1000 UNITS tablet Take 1,000 Units by mouth daily. Reported on 07/20/2015    [provider]  cholestyramine Lucrezia Starch) 4 GM/DOSE powder USE 1 SCOOP 3 TIMES A DAY WITH A MEAL 09/29/17   Susy Frizzle, MD  fish oil-omega-3 fatty  acids 1000 MG capsule Take 1 g by mouth daily.    [provider]  traZODone (DESYREL) 50 MG tablet Take 1 tablet (50 mg total) by mouth at bedtime as needed for sleep. 01/22/16   Susy Frizzle, MD    Family History Family History  Problem Relation Age of Onset  . Diabetes Sister   . Diabetes Brother   . Diabetes Sister   . Cancer Sister        breast    Social History Social History   Tobacco Use  . Smoking status: Never Smoker  . Smokeless tobacco: Never Used  Substance Use Topics  . Alcohol use: Yes    Comment: occasional  . Drug use: No     Allergies   Aleve [naproxen sodium]; Morphine and related; and Penicillins   Review of Systems Review of Systems  All systems reviewed and negative, other than as noted in HPI.  Physical Exam Updated Vital Signs BP (!) 158/92 (BP Location: Right Arm)   Pulse 68   Temp 98 F (36.7 C) (Oral)   Resp 20   Ht 5\' 10"  (1.778 m)   Wt 69.4 kg (153 lb)   SpO2 100%   BMI 21.95 kg/m   Physical Exam  Constitutional: He appears well-developed and well-nourished. No distress.  HENT:  Head:  Normocephalic and atraumatic.  Eyes: Conjunctivae are normal. Right eye exhibits no discharge. Left eye exhibits no discharge.  Neck: Neck supple.  Cardiovascular: Normal rate, regular rhythm and normal heart sounds. Exam reveals no gallop and no friction rub.  No murmur heard. Pulmonary/Chest: Effort normal and breath sounds normal. No respiratory distress.  Abdominal: Soft. He exhibits no distension. There is no tenderness.  Musculoskeletal: He exhibits no edema or tenderness.  Neurological: He is alert.  Skin: Skin is warm and dry.  Psychiatric: He has a normal mood and affect. His behavior is normal. Thought content normal.  Nursing note and vitals reviewed.    ED Treatments / Results  Labs (all labs ordered are listed, but only abnormal results are displayed) Labs Reviewed  I-STAT TROPONIN, ED - Abnormal; Notable for  the following components:      Result Value   Troponin i, poc 0.25 (*)    All other components within normal limits  CBC  PROTIME-INR  APTT  COMPREHENSIVE METABOLIC PANEL  LIPID PANEL    EKG EKG Interpretation  Date/Time:  Wednesday October 07 2017 16:23:56 EDT Ventricular Rate:  69 PR Interval:  154 QRS Duration: 68 QT Interval:  374 QTC Calculation: 400 R Axis:   -23 Text Interpretation:  Critical Test Result: STEMI Normal sinus rhythm Anterior infarct , possibly acute  ACUTE MI / STEMI  Abnormal ECG Confirmed by Virgel Manifold [phone]) on 10/07/2017 5:03:25 PM   Radiology Dg Chest Portable 1 View  Result Date: 10/07/2017 CLINICAL DATA:  84 year old male with a history of left-sided chest pain EXAM: PORTABLE CHEST 1 VIEW COMPARISON:  06/10/2004 FINDINGS: Cardiomediastinal silhouette within normal limits. No evidence of central vascular congestion. No pneumothorax or pleural effusion. No confluent airspace disease. No displaced fracture. IMPRESSION: Negative for acute cardiopulmonary disease. Electronically Signed   By: Corrie Mckusick D.O.   On: 10/07/2017 16:49    Procedures Procedures (including critical care time)  CRITICAL CARE Performed by: Virgel Manifold Total critical care time: 35 minutes Critical care time was exclusive of separately billable procedures and treating other patients. Critical care was necessary to treat or prevent imminent or life-threatening deterioration. Critical care was time spent personally by me on the following activities: development of treatment plan with patient and/or surrogate as well as nursing, discussions with consultants, evaluation of patient's response to treatment, examination of patient, obtaining history from patient or surrogate, ordering and performing treatments and interventions, ordering and review of laboratory studies, ordering and review of radiographic studies, pulse oximetry and re-evaluation of patient's  condition.  Medications Ordered in ED Medications  0.9 %  sodium chloride infusion (has no administration in time range)  nitroGLYCERIN (NITROSTAT) SL tablet 0.4 mg (has no administration in time range)  aspirin chewable tablet 324 mg (324 mg Oral Given 10/07/17 1653)     Initial Impression / Assessment and Plan / ED Course  I have reviewed the triage vital signs and the nursing notes.  Pertinent labs & imaging results that were available during my care of the patient were reviewed by me and considered in my medical decision making (see chart for details).  86yM with STEMI. Patient presented to the emergency room through triage.  His EKG was shown to another provider who correctly identified EKG as concerning for STEMI. Pt was moved to a resuscitation room where I then evaluated him. Code STEMI activated. ASA/heparin. Up to cath lab.   Final Clinical Impressions(s) / ED Diagnoses   Final diagnoses:  ST elevation  myocardial infarction (STEMI), unspecified artery [facility])    ED Discharge Orders    None       Virgel Manifold, MD 10/07/17 [id number]

## 2017-10-08 ENCOUNTER — Other Ambulatory Visit: Payer: Self-pay

## 2017-10-08 ENCOUNTER — Inpatient Hospital Stay (HOSPITAL_COMMUNITY): Payer: PPO

## 2017-10-08 ENCOUNTER — Encounter (HOSPITAL_COMMUNITY): Payer: Self-pay | Admitting: Cardiovascular Disease

## 2017-10-08 DIAGNOSIS — R072 Precordial pain: Secondary | ICD-10-CM

## 2017-10-08 LAB — POCT I-STAT, CHEM 8
BUN: 8 mg/dL (ref 8–23)
Calcium, Ion: 1.22 mmol/L (ref 1.15–1.40)
Chloride: 104 mmol/L (ref 98–111)
Creatinine, Ser: 0.7 mg/dL (ref 0.61–1.24)
Glucose, Bld: 118 mg/dL — ABNORMAL HIGH (ref 70–99)
HCT: 42 % (ref 39.0–52.0)
Hemoglobin: 14.3 g/dL (ref 13.0–17.0)
Potassium: 3.5 mmol/L (ref 3.5–5.1)
Sodium: 138 mmol/L (ref 135–145)
TCO2: 18 mmol/L — ABNORMAL LOW (ref 22–32)

## 2017-10-08 LAB — ECHOCARDIOGRAM COMPLETE
Height: 70 in
Weight: 2398.6 oz

## 2017-10-08 LAB — BASIC METABOLIC PANEL
Anion gap: 9 (ref 5–15)
BUN: 9 mg/dL (ref 8–23)
CO2: 20 mmol/L — ABNORMAL LOW (ref 22–32)
Calcium: 8.3 mg/dL — ABNORMAL LOW (ref 8.9–10.3)
Chloride: 106 mmol/L (ref 98–111)
Creatinine, Ser: 0.8 mg/dL (ref 0.61–1.24)
GFR calc Af Amer: >60 mL/min (ref >60)
GFR calc non Af Amer: >60 mL/min (ref >60)
Glucose, Bld: 130 mg/dL — ABNORMAL HIGH (ref 70–99)
Potassium: 3.9 mmol/L (ref 3.5–5.1)
Sodium: 135 mmol/L (ref 135–145)

## 2017-10-08 LAB — POCT ACTIVATED CLOTTING TIME
Activated Clotting Time: 389 seconds
Activated Clotting Time: 610 seconds

## 2017-10-08 LAB — CBC
HCT: 39.8 % (ref 39.0–52.0)
Hemoglobin: 13.6 g/dL (ref 13.0–17.0)
MCH: 32 pg (ref 26.0–34.0)
MCHC: 34.2 g/dL (ref 30.0–36.0)
MCV: 93.6 fL (ref 78.0–100.0)
Platelets: 252 10*3/uL (ref 150–400)
RBC: 4.25 MIL/uL (ref 4.22–5.81)
RDW: 11.9 % (ref 11.5–15.5)
WBC: 9.8 10*3/uL (ref 4.0–10.5)

## 2017-10-08 MED ORDER — CHOLESTYRAMINE 4 G PO PACK
4.0000 g | PACK | Freq: Three times a day (TID) | ORAL | Status: DC
  Administered 2017-10-08 – 2017-10-10 (×5): 4 g via ORAL
  Filled 2017-10-08 (×7): qty 1

## 2017-10-08 NOTE — Progress Notes (Signed)
CARDIAC REHAB PHASE I   PRE:  Rate/Rhythm: 64 SR  BP:  Sitting: 123/56      SaO2: 91 RA  MODE:  Ambulation: 270 ft   POST:  Rate/Rhythm: 71 SR  BP:  Sitting: 122/75    SaO2: 92 RA   Pt ambulated 250ft in hallway assist of one with gait belt. Pt with hastened gait, needed reminders to slow down. Pt denies SOB, but c/o CP 1-2/10. RN made aware. Pt stated CP improved with rest. Pt and family educated on importance of Brilinta, ASA, and NTG. Reviewed MI booklet. Pt and family given stent card along with heart healthy and low sodium diets. Reviewed restrictions and exercise guidelines with pt and family. Pt strongly encouraged to slow down and take it easy during the healing stages, and to listen more closely to his body. Will continue to follow and reinforce education.  Calvin, RN BSN 10/08/2017 11:32 AM

## 2017-10-08 NOTE — Progress Notes (Signed)
  Echocardiogram 2D Echocardiogram has been performed.  Madelaine Etienne 10/08/2017, 12:04 PM

## 2017-10-08 NOTE — Plan of Care (Signed)
  Problem: Pain Managment: Goal: General experience of comfort will improve Outcome: Progressing   Problem: Cardiac: Goal: Vascular access site(s) Level 0-1 will be maintained Outcome: Completed/Met

## 2017-10-08 NOTE — Care Management Note (Signed)
Case Management Note Marvetta Gibbons RN,BSN [facility]22 Case Manager  [phone]  Patient Details  Name: Eugene [name] MRN: [id number] Date of Birth: 07/22/1931  Subjective/Objective:  Pt admitted with STEMI s/p stenting                  Action/Plan: PTA pt lived at home with spouse, anticipate return home, referral for Brilinta needs. Per insurance check copay cost $45, spoke with pt and wife at the bedside, coverage for Brilinta shared, [facility] [address] 30 day free card provided. CM to follow for any further transition of care needs.   Expected Discharge Date:                  Expected Discharge Plan:  Home/Self Care  In-House Referral:     Discharge planning Services  CM Consult, Medication Assistance  Post Acute Care Choice:    Choice offered to:     DME Arranged:    DME Agency:     HH Arranged:    [place] Agency:     Status of Service:  Completed, signed off  If discussed at [place] of Stay Meetings, dates discussed:    Discharge Disposition: home/self care   Additional Comments:  Dawayne Patricia, RN 10/08/2017, 3:09 PM

## 2017-10-08 NOTE — Progress Notes (Signed)
Progress Note  Patient Name: Eugene [name] Date of Encounter: 10/08/2017  Primary Cardiologist: No primary care provider on file. [place]  Subjective   No chest pain this am. No dyspnea. No events  Inpatient Medications    Scheduled Meds: . aspirin EC  81 mg Oral QHS  . atorvastatin  80 mg Oral q1800  . carvedilol  3.125 mg Oral BID WC  . cholecalciferol  1,000 Units Oral Daily  . enoxaparin (LOVENOX) injection  40 mg Subcutaneous Q24H  . multivitamin  1 tablet Oral Daily  . omega-3 acid ethyl esters  1 g Oral Daily  . pantoprazole  40 mg Oral Daily  . sodium chloride flush  3 mL Intravenous Q12H  . ticagrelor  90 mg Oral BID   Continuous Infusions: . sodium chloride     PRN Meds: sodium chloride, acetaminophen, ALPRAZolam, nitroGLYCERIN, nitroGLYCERIN, ondansetron (ZOFRAN) IV, oxyCODONE, sodium chloride flush, traZODone   Vital Signs    Vitals:   10/08/17 0421 10/08/17 0500 10/08/17 0600 10/08/17 0700  BP:  102/68 (!) 100/56 116/69  Pulse:  63 63 71  Resp:  20 17 20   Temp: 97.6 F (36.4 C)     TempSrc: Oral     SpO2:  94% 95% 94%  Weight:      Height:        Intake/Output Summary (Last 24 hours) at 10/08/2017 0728 Last data filed at 10/08/2017 0300 Gross per 24 hour  Intake 688.73 ml  Output -  Net 688.73 ml   Filed Weights   10/07/17 1638 10/07/17 1833  Weight: 153 lb (69.4 kg) 149 lb 14.6 oz (68 kg)    Telemetry    Sinus with PVCs - Personally Reviewed  ECG    No AM ekg - Personally Reviewed  Physical Exam   GEN: No acute distress.   Neck: No JVD Cardiac: RRR, soft systolic murmur noted.   Respiratory: Clear to auscultation bilaterally. GI: Soft, nontender, non-distended  MS: No left LE edema; Right leg amputation with prosthesis in place.  Neuro:  Nonfocal  Psych: Normal affect   Labs    Chemistry Recent Labs  Lab 10/07/17 1628 10/08/17 0208  NA 138 135  K 3.9 3.9  CL 106 106  CO2 22 20*  GLUCOSE 121* 130*  BUN 9 9    CREATININE 0.95 0.80  CALCIUM 9.2 8.3*  PROT 6.5  --   ALBUMIN 3.9  --   AST 23  --   ALT 18  --   ALKPHOS 78  --   BILITOT 0.9  --   GFRNONAA >60 >60  GFRAA >60 >60  ANIONGAP 10 9     Hematology Recent Labs  Lab 10/07/17 1628 10/08/17 0208  WBC 9.0 9.8  RBC 4.75 4.25  HGB 15.4 13.6  HCT 45.0 39.8  MCV 94.7 93.6  MCH 32.4 32.0  MCHC 34.2 34.2  RDW 11.9 11.9  PLT 291 252    Cardiac EnzymesNo results for input(s): TROPONINI in the last 168 hours.  Recent Labs  Lab 10/07/17 1631  TROPIPOC 0.25*     BNPNo results for input(s): BNP, PROBNP in the last 168 hours.   DDimer No results for input(s): DDIMER in the last 168 hours.   Radiology    Dg Chest Portable 1 View  Result Date: 10/07/2017 CLINICAL DATA:  84 year old male with a history of left-sided chest pain EXAM: PORTABLE CHEST 1 VIEW COMPARISON:  06/10/2004 FINDINGS: Cardiomediastinal silhouette within normal limits. No evidence  of central vascular congestion. No pneumothorax or pleural effusion. No confluent airspace disease. No displaced fracture. IMPRESSION: Negative for acute cardiopulmonary disease. Electronically Signed   By: Corrie Mckusick D.O.   On: 10/07/2017 16:49    Cardiac Studies   Cardiac cath 10/07/17:  Prox RCA to Mid RCA lesion is 30% stenosed.  Mid LAD lesion is 99% stenosed.  Post intervention, there is a 0% residual stenosis.  A drug-eluting stent was successfully placed using a STENT SYNERGY DES 3X38.  Ost 2nd Diag lesion is 95% stenosed.  Dist LAD lesion is 30% stenosed.   1.  Severe one-vessel coronary artery disease with 99% thrombotic stenosis in the mid LAD at the bifurcation of second diagonal which also has severe critical disease but relatively small in diameter.  No other obstructive disease. 2.  Moderately elevated left ventricular end-diastolic pressure.  Left ventricular angiography was not performed. 3.  Successful angioplasty and drug-eluting stent placement to the mid  LAD.  The second diagonal was jailed by the stent with loss of flow after post dilation with noncompliant balloon.  I felt that the diameter was too small to rescue.  Slightly sluggish flow in the LAD after post dilation with some improvement with nitroglycerin.  Diagnostic Diagram       Post-Intervention Diagram          Patient Profile     84 y.o. male  With history of collagenous colitis and prior amputation of right lower ext due to trauma admitted with an anterior STEMI. The LAD had a severe stenosis and was treated with a drug eluting stent.   Assessment & Plan    1. CAD/Anterior STEMI: Pt is s/p placement of a drug eluting stent in the mid LAD. Non-obstructive disease in RCA. He is doing well this am. NO chest pain.  Will continue ASA, Brilinta, statin and beta blocker Echo today to assess LVEF Will monitor in ICU today  For questions or updates, please contact [place] Please consult [email or web address] for contact info under Cardiology/STEMI.      Signed, Lauree Chandler, MD  10/08/2017, 7:28 AM

## 2017-10-08 NOTE — Progress Notes (Signed)
Per insurance check on Brilinta  # 3.  S/W LOUISE @ [employer]      # [phone] OPT- 2    BRILINTA 90 MG BID  COVER- YES  CO-PAY- $ 45.00  TIER- 3 DRUG  PRIOR APPROVAL- NO   [facility]ED [facility]AC[facility] : [facility]

## 2017-10-09 DIAGNOSIS — I255 Ischemic cardiomyopathy: Secondary | ICD-10-CM

## 2017-10-09 LAB — CBC
HCT: 40.1 % (ref 39.0–52.0)
HCT: 40.3 % (ref 39.0–52.0)
Hemoglobin: 13.9 g/dL (ref 13.0–17.0)
Hemoglobin: 14 g/dL (ref 13.0–17.0)
MCH: 32.4 pg (ref 26.0–34.0)
MCH: 32.8 pg (ref 26.0–34.0)
MCHC: 34.7 g/dL (ref 30.0–36.0)
MCHC: 34.7 g/dL (ref 30.0–36.0)
MCV: 93.5 fL (ref 78.0–100.0)
MCV: 94.4 fL (ref 78.0–100.0)
Platelets: 216 10*3/uL (ref 150–400)
Platelets: 219 10*3/uL (ref 150–400)
RBC: 4.27 MIL/uL (ref 4.22–5.81)
RBC: 4.29 MIL/uL (ref 4.22–5.81)
RDW: 11.9 % (ref 11.5–15.5)
RDW: 11.9 % (ref 11.5–15.5)
WBC: 11.7 10*3/uL — ABNORMAL HIGH (ref 4.0–10.5)
WBC: 12.4 10*3/uL — ABNORMAL HIGH (ref 4.0–10.5)

## 2017-10-09 LAB — BASIC METABOLIC PANEL
Anion gap: 10 (ref 5–15)
BUN: 9 mg/dL (ref 8–23)
CO2: 22 mmol/L (ref 22–32)
Calcium: 8.5 mg/dL — ABNORMAL LOW (ref 8.9–10.3)
Chloride: 98 mmol/L (ref 98–111)
Creatinine, Ser: 0.83 mg/dL (ref 0.61–1.24)
GFR calc Af Amer: >60 mL/min (ref >60)
GFR calc non Af Amer: >60 mL/min (ref >60)
Glucose, Bld: 113 mg/dL — ABNORMAL HIGH (ref 70–99)
Potassium: 3.9 mmol/L (ref 3.5–5.1)
Sodium: 130 mmol/L — ABNORMAL LOW (ref 135–145)

## 2017-10-09 NOTE — Progress Notes (Signed)
Progress Note  Patient Name: Eugene [name] Date of Encounter: 10/09/2017  Primary Cardiologist: No primary care provider on file. [facility] to follow in [place])  Subjective   No chest pain or dyspnea. He feels great.   Inpatient Medications    Scheduled Meds: . aspirin EC  81 mg Oral QHS  . atorvastatin  80 mg Oral q1800  . carvedilol  3.125 mg Oral BID WC  . cholecalciferol  1,000 Units Oral Daily  . cholestyramine  4 g Oral TID WC  . enoxaparin (LOVENOX) injection  40 mg Subcutaneous Q24H  . multivitamin  1 tablet Oral Daily  . omega-3 acid ethyl esters  1 g Oral Daily  . pantoprazole  40 mg Oral Daily  . sodium chloride flush  3 mL Intravenous Q12H  . ticagrelor  90 mg Oral BID   Continuous Infusions: . sodium chloride     PRN Meds: sodium chloride, acetaminophen, ALPRAZolam, nitroGLYCERIN, nitroGLYCERIN, ondansetron (ZOFRAN) IV, oxyCODONE, sodium chloride flush, traZODone   Vital Signs    Vitals:   10/09/17 0500 10/09/17 0600 10/09/17 0700 10/09/17 0752  BP: 99/75 106/62 107/76   Pulse: 77 71 79   Resp: (!) 31 17 20    Temp:    (!) 97.3 F (36.3 C)  TempSrc:    Oral  SpO2: 94% 95% 93%   Weight:      Height:        Intake/Output Summary (Last 24 hours) at 10/09/2017 0831 Last data filed at 10/09/2017 0600 Gross per 24 hour  Intake 960 ml  Output 450 ml  Net 510 ml   Filed Weights   10/07/17 1638 10/07/17 1833  Weight: 153 lb (69.4 kg) 149 lb 14.6 oz (68 kg)    Telemetry    Sinus, NSVT yesterday - Personally Reviewed  ECG    No AM ekg - Personally Reviewed  Physical Exam    General: Well developed, well nourished, NAD  HEENT: OP clear, mucus membranes moist  SKIN: warm, dry. No rashes. Neuro: No focal deficits  Musculoskeletal: Muscle strength 5/5 all ext  Psychiatric: Mood and affect normal  Neck: No JVD, no carotid bruits, no thyromegaly, no lymphadenopathy.  Lungs:Clear bilaterally, no wheezes, rhonci, crackles Cardiovascular:  Regular rate and rhythm. No murmurs, gallops or rubs. Abdomen:Soft. Bowel sounds present. Non-tender.  Extremities: No lower extremity edema. Pulses are 2 + in the bilateral DP/PT.   Labs    Chemistry Recent Labs  Lab 10/07/17 1628 10/07/17 1728 10/08/17 0208 10/09/17 0240  NA 138 138 135 130*  K 3.9 3.5 3.9 3.9  CL 106 104 106 98  CO2 22  --  20* 22  GLUCOSE 121* 118* 130* 113*  BUN 9 8 9 9   CREATININE 0.95 0.70 0.80 0.83  CALCIUM 9.2  --  8.3* 8.5*  PROT 6.5  --   --   --   ALBUMIN 3.9  --   --   --   AST 23  --   --   --   ALT 18  --   --   --   ALKPHOS 78  --   --   --   BILITOT 0.9  --   --   --   GFRNONAA >60  --  >60 >60  GFRAA >60  --  >60 >60  ANIONGAP 10  --  9 10     Hematology Recent Labs  Lab 10/08/17 0208 10/09/17 0004 10/09/17 0240  WBC 9.8 11.7* 12.4*  RBC 4.25 4.29 4.27  HGB 13.6 13.9 14.0  HCT 39.8 40.1 40.3  MCV 93.6 93.5 94.4  MCH 32.0 32.4 32.8  MCHC 34.2 34.7 34.7  RDW 11.9 11.9 11.9  PLT 252 216 219    Cardiac EnzymesNo results for input(s): TROPONINI in the last 168 hours.  Recent Labs  Lab 10/07/17 1631  TROPIPOC 0.25*     BNPNo results for input(s): BNP, PROBNP in the last 168 hours.   DDimer No results for input(s): DDIMER in the last 168 hours.   Radiology    Dg Chest Portable 1 View  Result Date: 10/07/2017 CLINICAL DATA:  84 year old male with a history of left-sided chest pain EXAM: PORTABLE CHEST 1 VIEW COMPARISON:  06/10/2004 FINDINGS: Cardiomediastinal silhouette within normal limits. No evidence of central vascular congestion. No pneumothorax or pleural effusion. No confluent airspace disease. No displaced fracture. IMPRESSION: Negative for acute cardiopulmonary disease. Electronically Signed   By: Corrie Mckusick D.O.   On: 10/07/2017 16:49    Cardiac Studies   Cardiac cath 10/07/17:  Prox RCA to Mid RCA lesion is 30% stenosed.  Mid LAD lesion is 99% stenosed.  Post intervention, there is a 0% residual  stenosis.  A drug-eluting stent was successfully placed using a STENT SYNERGY DES 3X38.  Ost 2nd Diag lesion is 95% stenosed.  Dist LAD lesion is 30% stenosed.   1.  Severe one-vessel coronary artery disease with 99% thrombotic stenosis in the mid LAD at the bifurcation of second diagonal which also has severe critical disease but relatively small in diameter.  No other obstructive disease. 2.  Moderately elevated left ventricular end-diastolic pressure.  Left ventricular angiography was not performed. 3.  Successful angioplasty and drug-eluting stent placement to the mid LAD.  The second diagonal was jailed by the stent with loss of flow after post dilation with noncompliant balloon.  I felt that the diameter was too small to rescue.  Slightly sluggish flow in the LAD after post dilation with some improvement with nitroglycerin.  Diagnostic Diagram       Post-Intervention Diagram        Echo 10/08/17: - Left ventricle: The cavity size was normal. Systolic function was   moderately reduced. The estimated ejection fraction was in the   range of 35% to 40%. Akinesis of the mid-apical anteroseptal,   anterior, inferoseptal, apical inferior, and apical myocardium.   Features are consistent with a pseudonormal left ventricular   filling pattern, with concomitant abnormal relaxation and   increased filling pressure (grade 2 diastolic dysfunction).   Doppler parameters are consistent with high ventricular filling   pressure. - Aortic valve: Transvalvular velocity was within the normal range.   There was no stenosis. There was no regurgitation. Mean gradient   (S): 6 mm Hg. Valve area (VTI): 2.48 cm^2. Valve area (Vmax):   2.44 cm^2. Valve area (Vmean): 2.54 cm^2. - Mitral valve: Transvalvular velocity was within the normal range.   There was no evidence for stenosis. There was trivial   regurgitation. - Left atrium: The atrium was moderately dilated. - Right ventricle: The cavity size  was normal. Wall thickness was   normal. Systolic function was normal. - Tricuspid valve: There was moderate regurgitation. - Pulmonary arteries: Systolic pressure was moderately increased.   PA peak pressure: 53 mm Hg (S).   Patient Profile     84 y.o. 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 The LAD had a severe stenosis and was treated with a drug eluting stent.   Assessment & Plan    1. CAD/Anterior STEMI: Admitted with anterior STEMI on 10/07/17 and a drug eluting stent was placed in the mid LAD. Non-obstructive disease in RCA. LVEF 35-40% by echo 10/08/17.  No chest pain this am.  Continue DAPT for one year with ASA and Brilinta. Continue statin and beta blocker.    2. Ischemic cardiomyopathy: LVEF=35-40% by echo on 10/08/17. Will continue beta blocker. Will not start Ace-inh or ARB today given soft BP.   For questions or updates, please contact [place] Please consult [email or web address] for contact info under Cardiology/STEMI.      Signed, Lauree Chandler, MD  10/09/2017, 8:31 AM

## 2017-10-09 NOTE — Plan of Care (Signed)
  Problem: Elimination: Goal: Will not experience complications related to urinary retention Outcome: Progressing   Problem: Pain Managment: Goal: General experience of comfort will improve Outcome: Progressing   Problem: Safety: Goal: Ability to remain free from injury will improve Outcome: Progressing   Problem: Skin Integrity: Goal: Risk for impaired skin integrity will decrease Outcome: Progressing   Problem: Cardiac: Goal: Ability to achieve and maintain adequate cardiopulmonary perfusion will improve Outcome: Progressing   

## 2017-10-09 NOTE — Progress Notes (Signed)
CARDIAC REHAB PHASE I    Offered to walk with pt, pt states he has been walking in the hallways today. Pt denies CP or SOB. Reviewed MI education with pt and wife. Reinforced ASA, Brilinta, and NTG. Again encouraged pt to slow down and take it easy following event. Reviewed need for daily weights and monitoring sodium intake. Will refer to CRP II [place].   [phone] Rufina Falco, RN BSN 10/09/2017 2:34 PM

## 2017-10-10 ENCOUNTER — Encounter (HOSPITAL_COMMUNITY): Payer: Self-pay | Admitting: Physician Assistant

## 2017-10-10 DIAGNOSIS — I2510 Atherosclerotic heart disease of native coronary artery without angina pectoris: Secondary | ICD-10-CM

## 2017-10-10 DIAGNOSIS — E785 Hyperlipidemia, unspecified: Secondary | ICD-10-CM | POA: Diagnosis present

## 2017-10-10 DIAGNOSIS — I255 Ischemic cardiomyopathy: Secondary | ICD-10-CM | POA: Diagnosis present

## 2017-10-10 DIAGNOSIS — I5042 Chronic combined systolic (congestive) and diastolic (congestive) heart failure: Secondary | ICD-10-CM | POA: Diagnosis present

## 2017-10-10 LAB — BASIC METABOLIC PANEL
Anion gap: 7 (ref 5–15)
BUN: 8 mg/dL (ref 8–23)
CO2: 25 mmol/L (ref 22–32)
Calcium: 8.6 mg/dL — ABNORMAL LOW (ref 8.9–10.3)
Chloride: 104 mmol/L (ref 98–111)
Creatinine, Ser: 0.87 mg/dL (ref 0.61–1.24)
GFR calc Af Amer: >60 mL/min (ref >60)
GFR calc non Af Amer: >60 mL/min (ref >60)
Glucose, Bld: 103 mg/dL — ABNORMAL HIGH (ref 70–99)
Potassium: 3.9 mmol/L (ref 3.5–5.1)
Sodium: 136 mmol/L (ref 135–145)

## 2017-10-10 LAB — CBC
HCT: 38.3 % — ABNORMAL LOW (ref 39.0–52.0)
HCT: 38.8 % — ABNORMAL LOW (ref 39.0–52.0)
Hemoglobin: 13.1 g/dL (ref 13.0–17.0)
Hemoglobin: 13.4 g/dL (ref 13.0–17.0)
MCH: 32.1 pg (ref 26.0–34.0)
MCH: 32.4 pg (ref 26.0–34.0)
MCHC: 34.2 g/dL (ref 30.0–36.0)
MCHC: 34.5 g/dL (ref 30.0–36.0)
MCV: 93.7 fL (ref 78.0–100.0)
MCV: 93.9 fL (ref 78.0–100.0)
Platelets: 191 10*3/uL (ref 150–400)
Platelets: 192 10*3/uL (ref 150–400)
RBC: 4.08 MIL/uL — ABNORMAL LOW (ref 4.22–5.81)
RBC: 4.14 MIL/uL — ABNORMAL LOW (ref 4.22–5.81)
RDW: 11.8 % (ref 11.5–15.5)
RDW: 11.9 % (ref 11.5–15.5)
WBC: 10.4 10*3/uL (ref 4.0–10.5)
WBC: 9.6 10*3/uL (ref 4.0–10.5)

## 2017-10-10 MED ORDER — ATORVASTATIN CALCIUM 80 MG PO TABS: 80.0000 mg | ORAL_TABLET | Freq: Every day | ORAL | 3 refills | Status: DC

## 2017-10-10 MED ORDER — NITROGLYCERIN 0.4 MG SL SUBL: 0.4000 mg | SUBLINGUAL_TABLET | SUBLINGUAL | 3 refills | Status: DC | PRN

## 2017-10-10 MED ORDER — OMEGA-3-ACID ETHYL ESTERS 1 G PO CAPS: 1.0000 g | ORAL_CAPSULE | Freq: Every day | ORAL | 3 refills | Status: DC

## 2017-10-10 MED ORDER — TICAGRELOR 90 MG PO TABS: 90.0000 mg | ORAL_TABLET | Freq: Two times a day (BID) | ORAL | 0 refills | Status: DC

## 2017-10-10 MED ORDER — TICAGRELOR 90 MG PO TABS: 90.0000 mg | ORAL_TABLET | Freq: Two times a day (BID) | ORAL | 3 refills | Status: DC

## 2017-10-10 MED ORDER — CARVEDILOL 3.125 MG PO TABS: 3.1250 mg | ORAL_TABLET | Freq: Two times a day (BID) | ORAL | 3 refills | Status: DC

## 2017-10-10 NOTE — Progress Notes (Signed)
Progress Note  Patient Name: Eugene [name] Date of Encounter: 10/10/2017  Primary Cardiologist: No primary care provider on file. Arida ([facility]hany to follow in [place])  Subjective   No chest pain or shortness of breath.  Feels well today.  Inpatient Medications    Scheduled Meds: . aspirin EC  81 mg Oral QHS  . atorvastatin  80 mg Oral q1800  . carvedilol  3.125 mg Oral BID WC  . cholecalciferol  1,000 Units Oral Daily  . cholestyramine  4 g Oral TID WC  . enoxaparin (LOVENOX) injection  40 mg Subcutaneous Q24H  . multivitamin  1 tablet Oral Daily  . omega-3 acid ethyl esters  1 g Oral Daily  . pantoprazole  40 mg Oral Daily  . sodium chloride flush  3 mL Intravenous Q12H  . ticagrelor  90 mg Oral BID   Continuous Infusions: . sodium chloride     PRN Meds: sodium chloride, acetaminophen, ALPRAZolam, nitroGLYCERIN, nitroGLYCERIN, ondansetron (ZOFRAN) IV, oxyCODONE, sodium chloride flush, traZODone   Vital Signs    Vitals:   10/09/17 1437 10/09/17 2134 10/10/17 0546 10/10/17 0548  BP: 110/66 93/75 104/61   Pulse: 74 76 73   Resp: 16     Temp: 98.2 F (36.8 C) (!) 97.3 F (36.3 C) 97.8 F (36.6 C)   TempSrc: Oral Oral Oral   SpO2: 97% 97% 97%   Weight:    148 lb 4.8 oz (67.3 kg)  Height:        Intake/Output Summary (Last 24 hours) at 10/10/2017 1006 Last data filed at 10/09/2017 2200 Gross per 24 hour  Intake 543 ml  Output 125 ml  Net 418 ml   Filed Weights   10/07/17 1638 10/07/17 1833 10/10/17 0548  Weight: 153 lb (69.4 kg) 149 lb 14.6 oz (68 kg) 148 lb 4.8 oz (67.3 kg)    Telemetry    Sinus rhythm- Personally Reviewed  ECG    None new- Personally Reviewed  Physical Exam   GEN: Well nourished, well developed, in no acute distress  HEENT: normal  Neck: no JVD, carotid bruits, or masses Cardiac: RRR; no murmurs, rubs, or gallops,no edema  Respiratory:  clear to auscultation bilaterally, normal work of breathing GI: soft, nontender, nondistended,  + BS MS: no deformity or atrophy  Skin: warm and dry, right lower extremity BKA Neuro:  Strength and sensation are intact Psych: euthymic mood, full affect   Labs    Chemistry Recent Labs  Lab 10/07/17 1628  10/08/17 0208 10/09/17 0240 10/10/17 0426  NA 138   < > 135 130* 136  K 3.9   < > 3.9 3.9 3.9  CL 106   < > 106 98 104  CO2 22  --  20* 22 25  GLUCOSE 121*   < > 130* 113* 103*  BUN 9   < > 9 9 8   CREATININE 0.95   < > 0.80 0.83 0.87  CALCIUM 9.2  --  8.3* 8.5* 8.6*  PROT 6.5  --   --   --   --   ALBUMIN 3.9  --   --   --   --   AST 23  --   --   --   --   ALT 18  --   --   --   --   ALKPHOS 78  --   --   --   --   BILITOT 0.9  --   --   --   --  GFRNONAA >60  --  >60 >60 >60  GFRAA >60  --  >60 >60 >60  ANIONGAP 10  --  9 10 7    < > = values in this interval not displayed.     Hematology Recent Labs  Lab 10/09/17 0240 10/09/17 2350 10/10/17 0426  WBC 12.4* 10.4 9.6  RBC 4.27 4.08* 4.14*  HGB 14.0 13.1 13.4  HCT 40.3 38.3* 38.8*  MCV 94.4 93.9 93.7  MCH 32.8 32.1 32.4  MCHC 34.7 34.2 34.5  RDW 11.9 11.8 11.9  PLT 219 191 192    Cardiac EnzymesNo results for input(s): TROPONINI in the last 168 hours.  Recent Labs  Lab 10/07/17 1631  TROPIPOC 0.25*     BNPNo results for input(s): BNP, PROBNP in the last 168 hours.   DDimer No results for input(s): DDIMER in the last 168 hours.   Radiology    No results found.  Cardiac Studies   Cardiac cath 10/07/17:  Prox RCA to Mid RCA lesion is 30% stenosed.  Mid LAD lesion is 99% stenosed.  Post intervention, there is a 0% residual stenosis.  A drug-eluting stent was successfully placed using a STENT SYNERGY DES 3X38.  Ost 2nd Diag lesion is 95% stenosed.  Dist LAD lesion is 30% stenosed.   1.  Severe one-vessel coronary artery disease with 99% thrombotic stenosis in the mid LAD at the bifurcation of second diagonal which also has severe critical disease but relatively small in diameter.  No  other obstructive disease. 2.  Moderately elevated left ventricular end-diastolic pressure.  Left ventricular angiography was not performed. 3.  Successful angioplasty and drug-eluting stent placement to the mid LAD.  The second diagonal was jailed by the stent with loss of flow after post dilation with noncompliant balloon.  I felt that the diameter was too small to rescue.  Slightly sluggish flow in the LAD after post dilation with some improvement with nitroglycerin.  Diagnostic Diagram       Post-Intervention Diagram        Echo 10/08/17: - Left ventricle: The cavity size was normal. Systolic function was   moderately reduced. The estimated ejection fraction was in the   range of 35% to 40%. Akinesis of the mid-apical anteroseptal,   anterior, inferoseptal, apical inferior, and apical myocardium.   Features are consistent with a pseudonormal left ventricular   filling pattern, with concomitant abnormal relaxation and   increased filling pressure (grade 2 diastolic dysfunction).   Doppler parameters are consistent with high ventricular filling   pressure. - Aortic valve: Transvalvular velocity was within the normal range.   There was no stenosis. There was no regurgitation. Mean gradient   (S): 6 mm Hg. Valve area (VTI): 2.48 cm^2. Valve area (Vmax):   2.44 cm^2. Valve area (Vmean): 2.54 cm^2. - Mitral valve: Transvalvular velocity was within the normal range.   There was no evidence for stenosis. There was trivial   regurgitation. - Left atrium: The atrium was moderately dilated. - Right ventricle: The cavity size was normal. Wall thickness was   normal. Systolic function was normal. - Tricuspid valve: There was moderate regurgitation. - Pulmonary arteries: Systolic pressure was moderately increased.   PA peak pressure: 53 mm Hg (S).   Patient Profile     84 y.o. 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 The LAD had a severe stenosis and was treated with  a drug eluting stent.   Assessment & Plan    1. CAD/Anterior STEMI: Anterior STEMI 10/07/2017.  Drug-eluting stent placed to the LAD.  EF 35 to 40% by echo.  Kwan Shellhammer need 1 year of aspirin and Brilinta.  Continue statin and beta-blocker.  2.  Ischemic cardiomyopathy: EF 35 to 40% by echo 10/08/2017.  Continue beta-blocker.  Holding off on ACE inhibitor/ARB due to low blood pressures.  This Markesia Crilly likely be started as an outpatient.  For questions or updates, please contact [place] Please consult [email or web address] for contact info under Cardiology/STEMI.      Signed, Chandelle Harkey Meredith Leeds, MD  10/10/2017, 10:06 AM

## 2017-10-10 NOTE — Discharge Summary (Addendum)
Discharge Summary    Patient ID: Eugene [name],  MRN: [id number], DOB/AGE: November 12, 1931 84 y.o.  Admit date: 10/07/2017 Discharge date: 10/10/2017  Primary Care Provider: Jenna Luo T Primary Cardiologist: Lauree Chandler, MD  Discharge Diagnoses    Principal Problem:   Acute ST elevation myocardial infarction (STEMI) involving left anterior descending (LAD) coronary artery [facility]) Active Problems:   CAD (coronary artery disease)   Ischemic cardiomyopathy   Chronic combined systolic and diastolic heart failure ([place])   Hyperlipidemia LDL goal <70   Allergies Allergies  Allergen Reactions  . Aleve [Naproxen Sodium] Nausea And Vomiting    No problem with other NSAIDs  . Morphine And Related     dizzy  . Penicillins     Diarrhea     Diagnostic Studies/Procedures    Cardiac cath 10/07/17:  Prox RCA to Mid RCA lesion is 30% stenosed.  Mid LAD lesion is 99% stenosed.  Post intervention, there is a 0% residual stenosis.  A drug-eluting stent was successfully placed using a STENT SYNERGY DES 3X38.  Ost 2nd Diag lesion is 95% stenosed.  Dist LAD lesion is 30% stenosed.  1. Severe one-vessel coronary artery disease with 99% thrombotic stenosis in the mid LAD at the bifurcation of second diagonal which also has severe critical disease but relatively small in diameter. No other obstructive disease. 2. Moderately elevated left ventricular end-diastolic pressure. Left ventricular angiography was not performed. 3. Successful angioplasty and drug-eluting stent placement to the mid LAD. The second diagonal was jailed by the stent with loss of flow after post dilation with noncompliant balloon. I felt that the diameter was too small to rescue. Slightly sluggish flow in the LAD after post dilation with some improvement with nitroglycerin.  Diagnostic Diagram       Post-Intervention Diagram        Echo 10/08/17: - Left ventricle: The cavity size was  normal. Systolic function was moderately reduced. The estimated ejection fraction was in the range of 35% to 40%. Akinesis of the mid-apical anteroseptal, anterior, inferoseptal, apical inferior, and apical myocardium. Features are consistent with a pseudonormal left ventricular filling pattern, with concomitant abnormal relaxation and increased filling pressure (grade 2 diastolic dysfunction). Doppler parameters are consistent with high ventricular filling pressure. - Aortic valve: Transvalvular velocity was within the normal range. There was no stenosis. There was no regurgitation. Mean gradient (S): 6 mm Hg. Valve area (VTI): 2.48 cm^2. Valve area (Vmax): 2.44 cm^2. Valve area (Vmean): 2.54 cm^2. - Mitral valve: Transvalvular velocity was within the normal range. There was no evidence for stenosis. There was trivial regurgitation. - Left atrium: The atrium was moderately dilated. - Right ventricle: The cavity size was normal. Wall thickness was normal. Systolic function was normal. - Tricuspid valve: There was moderate regurgitation. - Pulmonary arteries: Systolic pressure was moderately increased. PA peak pressure: 53 mm Hg (S).   History of Present Illness     Eugene [name] is an 84 year old male with no prior cardiac history. He presented with acute onset of chest pain around 1430 today 10/07/17 while he was working in the yard.  The pain was described as tightness and aching which was left-sided and did not radiate.  He had mild shortness of breath but no nausea, vomiting or diaphoresis.  He reports no previous similar symptoms in the past.  He has been relatively healthy throughout his life and he is very active.  He is a lifelong non-smoker. Upon presentation, his EKG showed anterior, anterolateral  and inferior ST elevation suggestive of LAD disease that likely wrapped around the apex.  On evaluation, he was still having chest pain.  He was given aspirin.  Due to his symptoms and EKG changes, proceeded to emergent cardiac catheterization and possible PCI.  Hospital Course     Consultants: none  Anterolateral and inferior STEMI, CAD Pt was taken emergently to the cath lab for heart cath. 99% stenosis in midLAD with thrombotic stenosis at the bifurcation of the second diagonal, which was relatively small in diameter. Successful placement of DES to midLAD which jailed the second diagonal. He tolerated the procedure well.   Ischemic cardiomyopathy, new onset systolic and diastolic heart failure Echo with LVEF 35-40% and grade 2 DD. Pt is euvolemic on exam. ACE/ARB held for hypotension.   HTN Pressures are marginal. Eugene [name] hold ACE/ARB for now. Consider restarting at OP follow up if pressure can tolerate.   HLD with LDL goal less than 70 10/07/2017: Cholesterol 188; HDL 66; LDL Cholesterol 99; Triglycerides 113; VLDL 23 Continue lipitor 80 mg daily.    _____________  Discharge Vitals Blood pressure 104/61, pulse 73, temperature 97.8 F (36.6 C), temperature source Oral, resp. rate 16, height 5\' 10"  (1.778 m), weight 148 lb 4.8 oz (67.3 kg), SpO2 97 %.  Filed Weights   10/07/17 1638 10/07/17 1833 10/10/17 0548  Weight: 153 lb (69.4 kg) 149 lb 14.6 oz (68 kg) 148 lb 4.8 oz (67.3 kg)    Labs & Radiologic Studies    CBC Recent Labs    10/09/17 2350 10/10/17 0426  WBC 10.4 9.6  HGB 13.1 13.4  HCT 38.3* 38.8*  MCV 93.9 93.7  PLT 191 643   Basic Metabolic Panel Recent Labs    10/09/17 0240 10/10/17 0426  NA 130* 136  K 3.9 3.9  CL 98 104  CO2 22 25  GLUCOSE 113* 103*  BUN 9 8  CREATININE 0.83 0.87  CALCIUM 8.5* 8.6*   Liver Function Tests Recent Labs    10/07/17 1628  AST 23  ALT 18  ALKPHOS 78  BILITOT 0.9  PROT 6.5  ALBUMIN 3.9   No results for input(s): LIPASE, AMYLASE in the last 72 hours. Cardiac Enzymes No results for input(s): CKTOTAL, CKMB, CKMBINDEX, TROPONINI in the last 72 hours. BNP Invalid input(s):  POCBNP D-Dimer No results for input(s): DDIMER in the last 72 hours. Hemoglobin A1C No results for input(s): HGBA1C in the last 72 hours. Fasting Lipid Panel Recent Labs    10/07/17 1628  CHOL 188  HDL 66  LDLCALC 99  TRIG 113  CHOLHDL 2.8   Thyroid Function Tests No results for input(s): TSH, T4TOTAL, T3FREE, THYROIDAB in the last 72 hours.  Invalid input(s): FREET3 _____________  Dg Chest Portable 1 View  Result Date: 10/07/2017 CLINICAL DATA:  84 year old male with a history of left-sided chest pain EXAM: PORTABLE CHEST 1 VIEW COMPARISON:  06/10/2004 FINDINGS: Cardiomediastinal silhouette within normal limits. No evidence of central vascular congestion. No pneumothorax or pleural effusion. No confluent airspace disease. No displaced fracture. IMPRESSION: Negative for acute cardiopulmonary disease. Electronically Signed   By: Corrie Mckusick D.O.   On: 10/07/2017 16:49   Disposition   Pt is being discharged home today in good condition.  Follow-up Plans & Appointments     Discharge Instructions    AMB Referral to Cardiac Rehabilitation - Phase II   Complete by:  As directed    Diagnosis:   STEMI Coronary Stents     Amb Referral  to Cardiac Rehabilitation   Complete by:  As directed    Diagnosis:   Coronary Stents STEMI     Diet - low sodium heart healthy   Complete by:  As directed    Discharge instructions   Complete by:  As directed    No driving for 1 week. No lifting over 5 lbs for 1 week. No sexual activity for 1 week.  Keep procedure site clean & dry. If you notice increased pain, swelling, bleeding or pus, call/return!  You may shower, but no soaking baths/hot tubs/pools for 1 week.   Increase activity slowly   Complete by:  As directed       Discharge Medications   Allergies as of 10/10/2017      Reactions   Aleve [naproxen Sodium] Nausea And Vomiting   No problem with other NSAIDs   Morphine And Related    dizzy   Penicillins    Diarrhea        Medication List    STOP taking these medications   fish oil-omega-3 fatty acids 1000 MG capsule     TAKE these medications   ALPRAZolam 0.5 MG tablet Commonly known as:  XANAX Take 1 tablet (0.5 mg total) by mouth 3 (three) times daily as needed for anxiety.   aspirin 81 MG tablet Take 81 mg by mouth at bedtime.   atorvastatin 80 MG tablet Commonly known as:  LIPITOR Take 1 tablet (80 mg total) by mouth daily at 6 PM.   carvedilol 3.125 MG tablet Commonly known as:  COREG Take 1 tablet (3.125 mg total) by mouth 2 (two) times daily with a meal.   cholecalciferol 1000 units tablet Commonly known as:  VITAMIN D Take 1,000 Units by mouth daily. Reported on 07/20/2015   cholestyramine 4 GM/DOSE powder Commonly known as:  QUESTRAN USE 1 SCOOP 3 TIMES A DAY WITH A MEAL   multivitamin-lutein Caps capsule Take 1 capsule by mouth daily.   nitroGLYCERIN 0.4 MG SL tablet Commonly known as:  NITROSTAT Place 1 tablet (0.4 mg total) under the tongue every 5 (five) minutes x 3 doses as needed for chest pain.   omega-3 acid ethyl esters 1 g capsule Commonly known as:  LOVAZA Take 1 capsule (1 g total) by mouth daily. Start taking on:  10/11/2017   ticagrelor 90 MG Tabs tablet Commonly known as:  BRILINTA Take 1 tablet (90 mg total) by mouth 2 (two) times daily.   traZODone 50 MG tablet Commonly known as:  DESYREL Take 1 tablet (50 mg total) by mouth at bedtime as needed for sleep.        Acute coronary syndrome (MI, NSTEMI, STEMI, etc) this admission?: Yes.     AHA/ACC Clinical Performance & Quality Measures: 1. Aspirin prescribed? - Yes 2. ADP Receptor Inhibitor (Plavix/Clopidogrel, Brilinta/Ticagrelor or Effient/Prasugrel) prescribed (includes medically managed patients)? - Yes 3. Beta Blocker prescribed? - Yes 4. High Intensity Statin (Lipitor 40-80mg  or Crestor 20-40mg ) prescribed? - Yes 5. EF assessed during THIS hospitalization? - Yes 6. For EF <40%, was ACEI/ARB  prescribed? - No - Reason:  hypotension 7. For EF <40%, Aldosterone Antagonist (Spironolactone or Eplerenone) prescribed? - No - Reason:  hypotension 8. Cardiac Rehab Phase II ordered (Included Medically managed Patients)? - Yes     Outstanding Labs/Studies   ACEI/ARB added?  Lipids and LFTs in 6 weeks  Optimize medical therapy, consider rechecking echo 3 months after optimization  Duration of Discharge Encounter   Greater than 30 minutes  including physician time.  Signed, Tami Lin Duke, PA 10/10/2017, 10:40 AM  I have seen and examined this patient with Fabian Sharp.  Agree with above, note added to reflect my findings.  On exam, RRR, no murmurs, lungs clear.  Patient mid to the hospital with chest pain, found to have acute STEMI involving the LAD.  Drug-eluting stents placed.  Echo shows an EF of 35 to 40%.  He is currently feeling well.  We Miela Desjardin plan for discharge today.  Running off on ACE inhibitor/ARB due to relative hypotension.  This can be restarted as an outpatient.  Jalee Saine M. Doristine Shehan MD 10/10/2017 10:40 AM

## 2017-10-12 ENCOUNTER — Telehealth (HOSPITAL_COMMUNITY): Payer: Self-pay

## 2017-10-12 NOTE — Telephone Encounter (Signed)
Patients insurance is active and benefits verified through [employer] - $15.00 co-pay, no deductible, out of pocket amount of $3,400/$275.00 has been met, no co-insurance, and no pre-authorization is required. Reference [id number]  Will contact patient to see if he is interested in the Cardiac Rehab. If interested, patient will need to complete follow up appt. Once completed, patient will be contacted for scheduling.

## 2017-10-13 ENCOUNTER — Telehealth (HOSPITAL_COMMUNITY): Payer: Self-pay

## 2017-10-13 NOTE — Telephone Encounter (Signed)
Attempted to make initial call to patient in regards to Cardiac Rehab - lm on vm °

## 2017-10-21 ENCOUNTER — Telehealth (HOSPITAL_COMMUNITY): Payer: Self-pay

## 2017-10-21 NOTE — Telephone Encounter (Signed)
Follow up appt with Dr.McAlhany has been scheduled on 11/02/2017. This is the first follow up since patient has been discharged. Called and spoke with patient and wife of patient stating follow up will need to be completed before we proceed with scheduling. They verbalized understanding.

## 2017-10-27 DIAGNOSIS — L821 Other seborrheic keratosis: Secondary | ICD-10-CM | POA: Diagnosis not present

## 2017-10-27 DIAGNOSIS — D229 Melanocytic nevi, unspecified: Secondary | ICD-10-CM | POA: Diagnosis not present

## 2017-10-27 DIAGNOSIS — L570 Actinic keratosis: Secondary | ICD-10-CM | POA: Diagnosis not present

## 2017-11-01 ENCOUNTER — Encounter: Payer: Self-pay | Admitting: Physician Assistant
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
## 2017-11-02 ENCOUNTER — Ambulatory Visit (INDEPENDENT_AMBULATORY_CARE_PROVIDER_SITE_OTHER): Payer: PPO | Admitting: Physician Assistant

## 2017-11-02 ENCOUNTER — Encounter (HOSPITAL_COMMUNITY): Payer: Self-pay | Admitting: *Deleted

## 2017-11-02 ENCOUNTER — Encounter: Payer: Self-pay | Admitting: Physician Assistant

## 2017-11-02 VITALS — BP 122/72 | HR 57 | Ht 70.0 in | Wt 152.0 lb

## 2017-11-02 DIAGNOSIS — I2510 Atherosclerotic heart disease of native coronary artery without angina pectoris: Secondary | ICD-10-CM | POA: Diagnosis not present

## 2017-11-02 DIAGNOSIS — E7800 Pure hypercholesterolemia, unspecified: Secondary | ICD-10-CM | POA: Diagnosis not present

## 2017-11-02 DIAGNOSIS — I959 Hypotension, unspecified: Secondary | ICD-10-CM

## 2017-11-02 DIAGNOSIS — I255 Ischemic cardiomyopathy: Secondary | ICD-10-CM | POA: Diagnosis not present

## 2017-11-02 DIAGNOSIS — I5042 Chronic combined systolic (congestive) and diastolic (congestive) heart failure: Secondary | ICD-10-CM | POA: Diagnosis not present

## 2017-11-02 MED ORDER — TICAGRELOR 90 MG PO TABS: 90.0000 mg | ORAL_TABLET | Freq: Two times a day (BID) | ORAL | 3 refills | Status: DC

## 2017-11-02 MED ORDER — LOSARTAN POTASSIUM 25 MG PO TABS: 12.5000 mg | ORAL_TABLET | Freq: Every day | ORAL | 3 refills | Status: DC

## 2017-11-02 MED ORDER — CARVEDILOL 3.125 MG PO TABS: 3.1250 mg | ORAL_TABLET | Freq: Two times a day (BID) | ORAL | 3 refills | Status: DC

## 2017-11-02 MED ORDER — ATORVASTATIN CALCIUM 80 MG PO TABS: 80.0000 mg | ORAL_TABLET | Freq: Every day | ORAL | 3 refills | Status: DC

## 2017-11-02 NOTE — Patient Instructions (Addendum)
Medication Instructions:  Your physician has recommended you make the following change in your medication:  1.  STOP the Lovaza / Fish Oil 2.  START Losartan 12.5 mg daily = do not take if your top part of your blood pressure is less than 110  Labwork: 1 WEEK:   BMET  6 WEEKS:  FASTING LIPID & LFT  Testing/Procedures: None ordered  Follow-Up: Your physician recommends that you schedule a follow-up appointment in: [place]   Any Other Special Instructions Will Be Listed Below (If Applicable).     If you need a refill on your cardiac medications before your next appointment, please call your pharmacy.

## 2017-11-03 ENCOUNTER — Ambulatory Visit (INDEPENDENT_AMBULATORY_CARE_PROVIDER_SITE_OTHER): Payer: PPO | Admitting: Family Medicine

## 2017-11-03 ENCOUNTER — Telehealth (HOSPITAL_COMMUNITY): Payer: Self-pay

## 2017-11-03 ENCOUNTER — Encounter: Payer: Self-pay | Admitting: Family Medicine

## 2017-11-03 VITALS — BP 126/68 | HR 64 | Temp 97.8°F | Resp 14 | Ht 70.0 in | Wt 150.0 lb

## 2017-11-03 DIAGNOSIS — I2510 Atherosclerotic heart disease of native coronary artery without angina pectoris: Secondary | ICD-10-CM

## 2017-11-03 DIAGNOSIS — I255 Ischemic cardiomyopathy: Secondary | ICD-10-CM

## 2017-11-03 DIAGNOSIS — I252 Old myocardial infarction: Secondary | ICD-10-CM

## 2017-11-03 DIAGNOSIS — Z0000 Encounter for general adult medical examination without abnormal findings: Secondary | ICD-10-CM

## 2017-11-03 LAB — COMPLETE METABOLIC PANEL WITH GFR
AG Ratio: 1.6 (calc) (ref 1.0–2.5)
ALT: 20 U/L (ref 9–46)
AST: 19 U/L (ref 10–35)
Albumin: 3.9 g/dL (ref 3.6–5.1)
Alkaline phosphatase (APISO): 80 U/L (ref 40–115)
BUN: 12 mg/dL (ref 7–25)
CO2: 23 mmol/L (ref 20–32)
Calcium: 9 mg/dL (ref 8.6–10.3)
Chloride: 109 mmol/L (ref 98–110)
Creat: 0.88 mg/dL (ref 0.70–1.11)
GFR, Est African American: 91 mL/min/{1.73_m2} (ref >=60)
GFR, Est Non African American: 79 mL/min/{1.73_m2} (ref >=60)
Globulin: 2.4 g/dL (calc) (ref 1.9–3.7)
Glucose, Bld: 80 mg/dL (ref 65–99)
Potassium: 4.3 mmol/L (ref 3.5–5.3)
Sodium: 142 mmol/L (ref 135–146)
Total Bilirubin: 1.1 mg/dL (ref 0.2–1.2)
Total Protein: 6.3 g/dL (ref 6.1–8.1)

## 2017-11-03 LAB — LIPID PANEL
Cholesterol: 110 mg/dL (ref <200)
HDL: 46 mg/dL (ref >40)
LDL Cholesterol (Calc): 51 mg/dL (calc)
Non-HDL Cholesterol (Calc): 64 mg/dL (calc) (ref <130)
Total CHOL/HDL Ratio: 2.4 (calc) (ref <5.0)
Triglycerides: 57 mg/dL (ref <150)

## 2017-11-03 LAB — EXTRA LAV TOP TUBE

## 2017-11-03 NOTE — Telephone Encounter (Signed)
Attempted to call patient in regards to Cardiac Rehab - LM on VM 

## 2017-11-03 NOTE — Progress Notes (Signed)
Subjective:    Patient ID: Eugene [name], male    DOB: Jun 14, 1931, 84 y.o.   MRN: [id number]  HPI Patient was recently admitted to the hospital with an STEMI.  I have copied relevant portions of his discharge summary and included them below for my reference: Discharge Diagnoses    Principal Problem:   Acute ST elevation myocardial infarction (STEMI) involving left anterior descending (LAD) coronary artery (HCC) Active Problems:   CAD (coronary artery disease)   Ischemic cardiomyopathy   Chronic combined systolic and diastolic heart failure (HCC)   Hyperlipidemia LDL goal <70   Allergies      Allergies  Allergen Reactions  . Aleve [Naproxen Sodium] Nausea And Vomiting    No problem with other NSAIDs  . Morphine And Related     dizzy  . Penicillins     Diarrhea     Diagnostic Studies/Procedures    Cardiac cath 10/07/17:  Prox RCA to Mid RCA lesion is 30% stenosed.  Mid LAD lesion is 99% stenosed.  Post intervention, there is a 0% residual stenosis.  A drug-eluting stent was successfully placed using a STENT SYNERGY DES [id number].  Ost 2nd Diag lesion is 95% stenosed.  Dist LAD lesion is 30% stenosed.  1. Severe one-vessel coronary artery disease with 99% thrombotic stenosis in the mid LAD at the bifurcation of second diagonal which also has severe critical disease but relatively small in diameter. No other obstructive disease. 2. Moderately elevated left ventricular end-diastolic pressure. Left ventricular angiography was not performed. 3. Successful angioplasty and drug-eluting stent placement to the mid LAD. The second diagonal was jailed by the stent with loss of flow after post dilation with noncompliant balloon. I felt that the diameter was too small to rescue. Slightly sluggish flow in the LAD after post dilation with some improvement with nitroglycerin.  Diagnostic Diagram       Post-Intervention Diagram        Echo 10/08/17: - Left  ventricle: The cavity size was normal. Systolic function was moderately reduced. The estimated ejection fraction was in the range of 35% to 40%. Akinesis of the mid-apical anteroseptal, anterior, inferoseptal, apical inferior, and apical myocardium. Features are consistent with a pseudonormal left ventricular filling pattern, with concomitant abnormal relaxation and increased filling pressure (grade 2 diastolic dysfunction). Doppler parameters are consistent with high ventricular filling pressure. - Aortic valve: Transvalvular velocity was within the normal range. There was no stenosis. There was no regurgitation. Mean gradient (S): 6 mm Hg. Valve area (VTI): 2.48 cm^2. Valve area (Vmax): 2.44 cm^2. Valve area (Vmean): 2.54 cm^2. - Mitral valve: Transvalvular velocity was within the normal range. There was no evidence for stenosis. There was trivial regurgitation. - Left atrium: The atrium was moderately dilated. - Right ventricle: The cavity size was normal. Wall thickness was normal. Systolic function was normal. - Tricuspid valve: There was moderate regurgitation. - Pulmonary arteries: Systolic pressure was moderately increased. PA peak pressure: 53 mm Hg (S).   History of Present Illness     [name] an 84 year old male with no prior cardiac history. He presented with acute onset of chest pain around 1430 today 10/07/17 while he was working in the yard. The pain was described as tightness and aching which was left-sided and did not radiate. He had mild shortness of breath but no nausea, vomiting or diaphoresis. He reports no previous similar symptoms in the past.  He has been relatively healthy throughout his life and he is very active. He is  a lifelong non-smoker. Upon presentation, his EKG showed anterior,anterolateral and inferior ST elevation suggestive of LAD disease that likely wrapped around the apex. On evaluation, he was still having  chest pain. He was given aspirin. Due to his symptoms and EKG changes, proceeded to emergent cardiac catheterization and possible PCI.  Hospital Course     Consultants: none  Anterolateral and inferior STEMI, CAD Pt was taken emergently to the cath lab for heart cath. 99% stenosis in midLAD with thrombotic stenosis at the bifurcation of the second diagonal, which was relatively small in diameter. Successful placement of DES to midLAD which jailed the second diagonal. He tolerated the procedure well.   Ischemic cardiomyopathy, new onset systolic and diastolic heart failure Echo with LVEF 35-40% and grade 2 DD. Pt is euvolemic on exam. ACE/ARB held for hypotension.   HTN Pressures are marginal. Will hold ACE/ARB for now. Consider restarting at OP follow up if pressure can tolerate.   HLD with LDL goal less than 70 10/07/2017: Cholesterol 188; HDL 66; LDL Cholesterol 99; Triglycerides 113; VLDL 23 Continue lipitor 80 mg daily.   Discharge Medications   TAKE these medications   ALPRAZolam 0.5 MG tablet Commonly known as:  XANAX Take 1 tablet (0.5 mg total) by mouth 3 (three) times daily as needed for anxiety.   aspirin 81 MG tablet Take 81 mg by mouth at bedtime.   atorvastatin 80 MG tablet Commonly known as:  LIPITOR Take 1 tablet (80 mg total) by mouth daily at 6 PM.   carvedilol 3.125 MG tablet Commonly known as:  COREG Take 1 tablet (3.125 mg total) by mouth 2 (two) times daily with a meal.   cholecalciferol 1000 units tablet Commonly known as:  VITAMIN D Take 1,000 Units by mouth daily. Reported on 07/20/2015   cholestyramine 4 GM/DOSE powder Commonly known as:  QUESTRAN USE 1 SCOOP 3 TIMES A DAY WITH A MEAL   multivitamin-lutein Caps capsule Take 1 capsule by mouth daily.   nitroGLYCERIN 0.4 MG SL tablet Commonly known as:  NITROSTAT Place 1 tablet (0.4 mg total) under the tongue every 5 (five) minutes x 3 doses as needed for chest pain.   omega-3  acid ethyl esters 1 g capsule Commonly known as:  LOVAZA Take 1 capsule (1 g total) by mouth daily. Start taking on:  10/11/2017   ticagrelor 90 MG Tabs tablet Commonly known as:  BRILINTA Take 1 tablet (90 mg total) by mouth 2 (two) times daily.   traZODone 50 MG tablet Commonly known as:  DESYREL Take 1 tablet (50 mg total) by mouth at bedtime as needed for sleep.        Acute coronary syndrome (MI, NSTEMI, STEMI, etc) this admission?: Yes.     AHA/ACC Clinical Performance & Quality Measures: 1. Aspirin prescribed? - Yes 2. ADP Receptor Inhibitor (Plavix/Clopidogrel, Brilinta/Ticagrelor or Effient/Prasugrel) prescribed (includes medically managed patients)? - Yes 3. Beta Blocker prescribed? - Yes 4. High Intensity Statin (Lipitor 40-80mg  or Crestor 20-40mg ) prescribed? - Yes 5. EF assessed during THIS hospitalization? - Yes 6. For EF <40%, was ACEI/ARB prescribed? - No - Reason:  hypotension 7. For EF <40%, Aldosterone Antagonist (Spironolactone or Eplerenone) prescribed? - No - Reason:  hypotension 8. Cardiac Rehab Phase II ordered (Included Medically managed Patients)? - Yes    Patient is scheduled today for complete physical exam however I use this opportunity more as a hospital follow-up.  He is taking aspirin and Brilinta.  He denies any bleeding or bruising.  He denies any wheezing on the Brilinta.  He is tolerating the medication well.  I emphasized that he needs to be on a dual antiplatelet agent for at least a year unless absolutely necessary.  He is taking his beta-blocker.  He does have some mild dizziness and mild bradycardia but he is tolerating it okay.  He is not on a ACE inhibitor/angiotensin receptor blocker primarily due to hypotension.  His blood pressure today is within normal limits at 122/72.  I believe that as he gets stronger over the next week or 2 and his dizziness improves he can likely tolerate the addition of a low-dose ACE inhibitor to help improve  his ejection fraction.  He is taking his statin medication.  He is due to recheck a CMP and fasting lipid panel.  His LDL was 99.  He is currently taking 80 mg a day of Lipitor.  He denies any myalgias or right upper quadrant pain.  His goal LDL cholesterol be less than 70.  His immunizations are up-to-date except for flu shot.  His colonoscopy is up-to-date.  Due to age, I have recommended against prostate cancer screening  Immunization History  Administered Date(s) Administered  . Influenza, High Dose Seasonal PF 01/08/2017  . Influenza,inj,Quad PF,6+ Mos 12/28/2012, 01/17/2014  . Influenza-Unspecified 01/15/2016  . Pneumococcal Conjugate-13 09/21/2014  . Pneumococcal Polysaccharide-23 05/09/2002  . Tdap 07/29/2012  . Zoster 07/07/2006  . Zoster Recombinat (Shingrix) 05/27/2017, 08/03/2017   Past Medical History:  Diagnosis Date  . Amputated right leg ([place])   . CAD (coronary artery disease)    a. 09/2017 - acute MI -  emergent cath showing 99% stenosis in midLAD with thrombotic stenosis at the bifurcation of the second diagonal, which was relatively small in diameter. He underwent placement of DES to midLAD which jailed the second diagonal.   . Chronic combined systolic and diastolic heart failure ([facility])   . Collagenous colitis    Dr. Festus Holts  . History of ST elevation myocardial infarction (STEMI)    09/2017: LAD  . History of stomach ulcers   . Hyperlipidemia LDL goal <70   . Ischemic cardiomyopathy    Past Surgical History:  Procedure Laterality Date  . BACK SURGERY    . CORONARY STENT INTERVENTION N/A 10/07/2017   Procedure: CORONARY STENT INTERVENTION;  Surgeon: Wellington Hampshire, MD;  Location: [facility];  Service: Cardiovascular;  Laterality: N/A;  . CORONARY/GRAFT ACUTE MI REVASCULARIZATION N/A 10/07/2017   Procedure: Coronary/Graft Acute MI Revascularization;  Surgeon: Wellington Hampshire, MD;  Location: [facility];  Service: Cardiovascular;  Laterality: N/A;   . LEFT HEART CATH AND CORONARY ANGIOGRAPHY N/A 10/07/2017   Procedure: LEFT HEART CATH AND CORONARY ANGIOGRAPHY;  Surgeon: Wellington Hampshire, MD;  Location: [facility];  Service: Cardiovascular;  Laterality: N/A;  . LEG AMPUTATION     Right Below knee  . STOMACH SURGERY     Billroth II gastrojejunostomy  . TOTAL KNEE ARTHROPLASTY     Current Outpatient Medications on File Prior to Visit  Medication Sig Dispense Refill  . aspirin 81 MG tablet Take 81 mg by mouth at bedtime.    Marland Kitchen atorvastatin (LIPITOR) 80 MG tablet Take 1 tablet (80 mg total) by mouth daily at 6 PM. 90 tablet 3  . carvedilol (COREG) 3.125 MG tablet Take 1 tablet (3.125 mg total) by mouth 2 (two) times daily with a meal. 180 tablet 3  . cholestyramine (QUESTRAN) 4 GM/DOSE powder USE 1  SCOOP 3 TIMES A DAY WITH A MEAL 378 g 12  . losartan (COZAAR) 25 MG tablet Take 0.5 tablets (12.5 mg total) by mouth daily. 45 tablet 3  . multivitamin-lutein (OCUVITE-LUTEIN) CAPS capsule Take 1 capsule by mouth daily.    . nitroGLYCERIN (NITROSTAT) 0.4 MG SL tablet Place 1 tablet (0.4 mg total) under the tongue every 5 (five) minutes x 3 doses as needed for chest pain. 25 tablet 3  . ticagrelor (BRILINTA) 90 MG TABS tablet Take 1 tablet (90 mg total) by mouth 2 (two) times daily. 180 tablet 3   No current facility-administered medications on file prior to visit.    Allergies  Allergen Reactions  . Aleve [Naproxen Sodium] Nausea And Vomiting    No problem with other NSAIDs  . Morphine And Related     dizzy  . Penicillins     Diarrhea    Social History   Socioeconomic History  . Marital status: Married    Spouse name: Not on file  . Number of children: Not on file  . Years of education: Not on file  . Highest education level: Not on file  Occupational History  . Not on file  Social Needs  . Financial resource strain: Not on file  . Food insecurity:    Worry: Not on file    Inability: Not on file  . Transportation  needs:    Medical: Not on file    Non-medical: Not on file  Tobacco Use  . Smoking status: Never Smoker  . Smokeless tobacco: Never Used  Substance and Sexual Activity  . Alcohol use: Yes    Comment: occasional  . Drug use: No  . Sexual activity: Yes    Comment: married  Lifestyle  . Physical activity:    Days per week: Not on file    Minutes per session: Not on file  . Stress: Not on file  Relationships  . Social connections:    Talks on phone: Not on file    Gets together: Not on file    Attends religious service: Not on file    Active member of club or organization: Not on file    Attends meetings of clubs or organizations: Not on file    Relationship status: Not on file  . Intimate partner violence:    Fear of current or ex partner: Not on file    Emotionally abused: Not on file    Physically abused: Not on file    Forced sexual activity: Not on file  Other Topics Concern  . Not on file  Social History Narrative  . Not on file   Family History  Problem Relation Age of Onset  . Diabetes Sister   . Diabetes Brother   . Diabetes Sister   . Cancer Sister        breast      Review of Systems  All other systems reviewed and are negative.      Objective:   Physical Exam  Constitutional: He is oriented to person, place, and time. He appears well-developed and well-nourished. No distress.  HENT:  Head: Normocephalic and atraumatic.  Right Ear: External ear normal.  Left Ear: External ear normal.  Nose: Nose normal.  Mouth/Throat: Oropharynx is clear and moist. No oropharyngeal exudate.  Eyes: Pupils are equal, round, and reactive to light. Conjunctivae and EOM are normal. Right eye exhibits no discharge. Left eye exhibits no discharge. No scleral icterus.  Neck: Normal range of motion. Neck  supple. No JVD present. No tracheal deviation present. No thyromegaly present.  Cardiovascular: Normal rate, regular rhythm, normal heart sounds and intact distal pulses.  Exam reveals no gallop and no friction rub.  No murmur heard. Pulmonary/Chest: Effort normal and breath sounds normal. No stridor. No respiratory distress. He has no wheezes. He has no rales. He exhibits no tenderness.  Abdominal: Soft. Bowel sounds are normal. He exhibits no distension and no mass. There is no tenderness. There is no rebound and no guarding.  Musculoskeletal: Normal range of motion. He exhibits no edema or tenderness.  Lymphadenopathy:    He has no cervical adenopathy.  Neurological: He is alert and oriented to person, place, and time. He has normal reflexes. No cranial nerve deficit. He exhibits normal muscle tone. Coordination normal.  Skin: Skin is warm. No rash noted. He is not diaphoretic. No erythema. No pallor.  Psychiatric: He has a normal mood and affect. His behavior is normal. Judgment and thought content normal.  Vitals reviewed. 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 Examination of his left foot shows normal dorsalis pedis pulses, normal posterior tibialis pulses, normal sensation to 10 g monofilament and no evidence of ulcers or sores. He does have a dystrophic toenail on his left great toe        Assessment & Plan:  History of ST elevation myocardial infarction (STEMI) - Plan: COMPLETE METABOLIC PANEL WITH GFR, Lipid panel  General medical examination  I have asked to see the patient back in 2 weeks.  As he continues to recover and get stronger, I would like to try the patient on a low-dose ACE inhibitor to improve his ejection fraction.  I plan to start this in 2 weeks.  At that follow-up appointment, he will be able to get his flu shot.  The remainder of his immunizations are up-to-date.  I have recommended against a prostate cancer screening test.  Colonoscopy is up-to-date.  I will check a CMP and a fasting lipid panel and then forward those results to his cardiologist.  He is tolerating his  medications well with no concerns.  He denies any falls or depression.  He does have trouble hearing but is wearing bilateral hearing aids.  There may be some mild memory problems as the patient has a difficult time remembering his medication however at the present time they are not causing any significant problems with his activities of daily living.  Reassess the patient in 2 weeks and at that point add a low-dose ACE inhibitor

## 2017-11-04 ENCOUNTER — Telehealth: Payer: Self-pay | Admitting: Physician Assistant

## 2017-11-09 ENCOUNTER — Other Ambulatory Visit: Payer: PPO | Admitting: *Deleted

## 2017-11-09 DIAGNOSIS — I2510 Atherosclerotic heart disease of native coronary artery without angina pectoris: Secondary | ICD-10-CM

## 2017-11-09 DIAGNOSIS — E7800 Pure hypercholesterolemia, unspecified: Secondary | ICD-10-CM

## 2017-11-09 DIAGNOSIS — I255 Ischemic cardiomyopathy: Secondary | ICD-10-CM | POA: Diagnosis not present

## 2017-11-09 DIAGNOSIS — I959 Hypotension, unspecified: Secondary | ICD-10-CM | POA: Diagnosis not present

## 2017-11-09 DIAGNOSIS — I5042 Chronic combined systolic (congestive) and diastolic (congestive) heart failure: Secondary | ICD-10-CM

## 2017-11-10 ENCOUNTER — Telehealth (HOSPITAL_COMMUNITY): Payer: Self-pay

## 2017-11-10 LAB — LIPID PANEL
Chol/HDL Ratio: 2.2 ratio (ref 0.0–5.0)
Cholesterol, Total: 112 mg/dL (ref 100–199)
HDL: 52 mg/dL (ref >39)
LDL Calculated: 43 mg/dL (ref 0–99)
Triglycerides: 83 mg/dL (ref 0–149)
VLDL Cholesterol Cal: 17 mg/dL (ref 5–40)

## 2017-11-10 LAB — HEPATIC FUNCTION PANEL
ALT: 25 IU/L (ref 0–44)
AST: 21 IU/L (ref 0–40)
Albumin: 4.1 g/dL (ref 3.5–4.7)
Alkaline Phosphatase: 95 IU/L (ref 39–117)
Bilirubin Total: 0.8 mg/dL (ref 0.0–1.2)
Bilirubin, Direct: 0.22 mg/dL (ref 0.00–0.40)
Total Protein: 6.1 g/dL (ref 6.0–8.5)

## 2017-11-10 LAB — BASIC METABOLIC PANEL
BUN/Creatinine Ratio: 15 (ref 10–24)
BUN: 13 mg/dL (ref 8–27)
CO2: 20 mmol/L (ref 20–29)
Calcium: 8.8 mg/dL (ref 8.6–10.2)
Chloride: 104 mmol/L (ref 96–106)
Creatinine, Ser: 0.87 mg/dL (ref 0.76–1.27)
GFR calc Af Amer: 92 mL/min/{1.73_m2} (ref >59)
GFR calc non Af Amer: 79 mL/min/{1.73_m2} (ref >59)
Glucose: 122 mg/dL — ABNORMAL HIGH (ref 65–99)
Potassium: 3.8 mmol/L (ref 3.5–5.2)
Sodium: 139 mmol/L (ref 134–144)

## 2017-11-10 NOTE — Telephone Encounter (Signed)
New Messages       Patient's wife called states that the patient is not walking since his surgery, can not get in to [facility]  rehab until 10/03. Need to know what to do until then?

## 2017-11-10 NOTE — Telephone Encounter (Signed)
New Message:   Patient wife calling back, because they don:t  know what to do. This patient called on 11/04/17 she has not heard from anyone. Please call patient back.

## 2017-11-10 NOTE — Telephone Encounter (Signed)
Left the pt a message to call the office back to endorse activity recommendations per Dr Angelena Form.

## 2017-11-10 NOTE — Telephone Encounter (Signed)
Follow UP:    Patient wife calling back, because she has not heard anything back. She called on 11/04/17. She need to know what to do. Please call patient back.

## 2017-11-10 NOTE — Telephone Encounter (Signed)
I would advise him to get back into walking. Walking on the treadmill is ok. Would walk at a slow pace for the first week and gradually increase speed as tolerated. Would walk on a flat incline for now. Thanks, chris

## 2017-11-10 NOTE — Telephone Encounter (Signed)
Spoke it patient and his wife separately. He cannot start cardiac rehab until 10/3.  Checked [place] and wait is longer. Wants to know what he is supposed to do because he is just sitting around. Was told not to ride his tractor. Advised that as far as walking he should be doing this. Advised to walk outside on level driveway to mailbox and back during morning or late evening  Start with doing one walk to mailbox and back and do it 2-3 times a day--walk inside on humid or hottest times of day.  Pts wife tells me he won't do this and [facility] [employer] and she wanted to know if he could go there and walk on treadmill.  States he "goes too hard and too long" when he goes and never goes regularly.  I strongly advised to do the walk outside plan for now and that I will forward to Dr. Angelena Form and his nurse for review.  If there are any new recommendations we will call them back.

## 2017-11-10 NOTE — Telephone Encounter (Signed)
Called pt in regards to CR, pt was not home. Spoke to pt wife Wells Guiles who stated she will have patient give [place] a call when he returns.

## 2017-11-11 ENCOUNTER — Telehealth: Payer: Self-pay | Admitting: Cardiovascular Disease

## 2017-11-11 ENCOUNTER — Telehealth (HOSPITAL_COMMUNITY): Payer: Self-pay

## 2017-11-11 NOTE — Telephone Encounter (Signed)
New Message:     Pt return a call for activity recommendations

## 2017-11-11 NOTE — Telephone Encounter (Signed)
Eugene Blanks, MD  to [facility] Triage . Eugene Grayer, RN      4:50 PM  Note    I would advise him to get back into walking. Walking on the treadmill is ok. Would walk at a slow pace for the first week and gradually increase speed as tolerated. Would walk on a flat incline for now. Thanks, Eugene [name]	Eugene [name] patient's wife back and informed her of Dr. Camillia Herter advisement. Patient's wife stated Eugene Copa PA told patient not to cut his grass on his riding lawnmower the rest of this season. Informed patient's wife to follow 67 advisement as well. Patient's wife verbalized understanding and will let her husband know.

## 2017-11-11 NOTE — Telephone Encounter (Signed)
Wife of patient called and left vm yesterday afternoon. Returned phone call and spoke with patient in regards to Cardiac Rehab - Scheduled orientation on 12/22/17 at 8:30am. Patient will attend the 9:45am exc class. Mailed packet.

## 2017-11-17 ENCOUNTER — Telehealth (HOSPITAL_COMMUNITY): Payer: Self-pay

## 2017-11-17 NOTE — Telephone Encounter (Signed)
Wife of patient called and asked if there was a way to get patient in sooner than October. I explained to wife that we have patient on our cancellation list. There is some potential he can get in quicker but not guaranteed. Becky verbalized understanding.

## 2017-11-23 ENCOUNTER — Encounter: Payer: Self-pay | Admitting: Family Medicine

## 2017-11-23 ENCOUNTER — Ambulatory Visit (INDEPENDENT_AMBULATORY_CARE_PROVIDER_SITE_OTHER): Payer: PPO | Admitting: Family Medicine

## 2017-11-23 VITALS — BP 130/60 | HR 78 | Temp 98.0°F | Resp 14 | Ht 70.0 in | Wt 155.0 lb

## 2017-11-23 DIAGNOSIS — I2510 Atherosclerotic heart disease of native coronary artery without angina pectoris: Secondary | ICD-10-CM

## 2017-11-23 DIAGNOSIS — I252 Old myocardial infarction: Secondary | ICD-10-CM | POA: Diagnosis not present

## 2017-11-23 DIAGNOSIS — I255 Ischemic cardiomyopathy: Secondary | ICD-10-CM

## 2017-11-23 NOTE — Progress Notes (Signed)
Subjective:    Patient ID: Eugene [name], male    DOB: 1931-10-26, 84 y.o.   MRN: [id number]  HPI  11/03/17 Patient is scheduled today for complete physical exam however I use this opportunity more as a hospital follow-up.  He is taking aspirin and Brilinta.  He denies any bleeding or bruising.  He denies any wheezing on the Brilinta.  He is tolerating the medication well.  I emphasized that he needs to be on a dual antiplatelet agent for at least a year unless absolutely necessary.  He is taking his beta-blocker.  He does have some mild dizziness and mild bradycardia but he is tolerating it okay.  He is not on a ACE inhibitor/angiotensin receptor blocker primarily due to hypotension.  His blood pressure today is within normal limits at 122/72.  I believe that as he gets stronger over the next week or 2 and his dizziness improves he can likely tolerate the addition of a low-dose ACE inhibitor to help improve his ejection fraction.  He is taking his statin medication.  He is due to recheck a CMP and fasting lipid panel.  His LDL was 99.  He is currently taking 80 mg a day of Lipitor.  He denies any myalgias or right upper quadrant pain.  His goal LDL cholesterol be less than 70.  His immunizations are up-to-date except for flu shot.  His colonoscopy is up-to-date.  Due to age, I have recommended against prostate cancer screening.  At that time, my plan was: I have asked to see the patient back in 2 weeks.  As he continues to recover and get stronger, I would like to try the patient on a low-dose ACE inhibitor to improve his ejection fraction.  I plan to start this in 2 weeks.  At that follow-up appointment, he will be able to get his flu shot.  The remainder of his immunizations are up-to-date.  I have recommended against a prostate cancer screening test.  Colonoscopy is up-to-date.  I will check a CMP and a fasting lipid panel and then forward those results to his cardiologist.  He is tolerating his  medications well with no concerns.  He denies any falls or depression.  He does have trouble hearing but is wearing bilateral hearing aids.  There may be some mild memory problems as the patient has a difficult time remembering his medication however at the present time they are not causing any significant problems with his activities of daily living.  Reassess the patient in 2 weeks and at that point add a low-dose ACE inhibitor.  11/23/17 Since I last saw the patient, he saw his cardiologist who started him on losartan 12.5 mg a day.  He is here today for follow-up.  He denies any dizziness or lightheadedness on the losartan.  He denies any low blood pressure.  He has not seen a systolic blood pressure less than 100.  He denies any fainting spells.  He denies any chest pain shortness of breath or dyspnea on exertion  Immunization History  Administered Date(s) Administered  . Influenza, High Dose Seasonal PF 01/08/2017  . Influenza,inj,Quad PF,6+ Mos 12/28/2012, 01/17/2014  . Influenza-Unspecified 01/15/2016  . Pneumococcal Conjugate-13 09/21/2014  . Pneumococcal Polysaccharide-23 05/09/2002  . Tdap 07/29/2012  . Zoster 07/07/2006  . Zoster Recombinat (Shingrix) 05/27/2017, 07/30/2017   Past Medical History:  Diagnosis Date  . Amputated right leg ([place])   . CAD (coronary artery disease)    a. 09/2017 - acute  MI -  emergent cath showing 99% stenosis in midLAD with thrombotic stenosis at the bifurcation of the second diagonal, which was relatively small in diameter. He underwent placement of DES to midLAD which jailed the second diagonal.   . Chronic combined systolic and diastolic heart failure ([place])   . Collagenous colitis    Dr. Festus Holts  . History of ST elevation myocardial infarction (STEMI)    09/2017: LAD  . History of stomach ulcers   . Hyperlipidemia LDL goal <70   . Ischemic cardiomyopathy    Past Surgical History:  Procedure Laterality Date  . BACK SURGERY    . CORONARY STENT  INTERVENTION N/A 10/07/2017   Procedure: CORONARY STENT INTERVENTION;  Surgeon: Wellington Hampshire, MD;  Location: [facility];  Service: Cardiovascular;  Laterality: N/A;  . CORONARY/GRAFT ACUTE MI REVASCULARIZATION N/A 10/07/2017   Procedure: Coronary/Graft Acute MI Revascularization;  Surgeon: Wellington Hampshire, MD;  Location: [facility];  Service: Cardiovascular;  Laterality: N/A;  . LEFT HEART CATH AND CORONARY ANGIOGRAPHY N/A 10/07/2017   Procedure: LEFT HEART CATH AND CORONARY ANGIOGRAPHY;  Surgeon: Wellington Hampshire, MD;  Location: [facility];  Service: Cardiovascular;  Laterality: N/A;  . LEG AMPUTATION     Right Below knee  . STOMACH SURGERY     Billroth II gastrojejunostomy  . TOTAL KNEE ARTHROPLASTY     Current Outpatient Medications on File Prior to Visit  Medication Sig Dispense Refill  . aspirin 81 MG tablet Take 81 mg by mouth at bedtime.    Marland Kitchen atorvastatin (LIPITOR) 80 MG tablet Take 1 tablet (80 mg total) by mouth daily at 6 PM. 90 tablet 3  . carvedilol (COREG) 3.125 MG tablet Take 1 tablet (3.125 mg total) by mouth 2 (two) times daily with a meal. 180 tablet 3  . cholestyramine (QUESTRAN) 4 GM/DOSE powder USE 1 SCOOP 3 TIMES A DAY WITH A MEAL 378 g 12  . losartan (COZAAR) 25 MG tablet Take 0.5 tablets (12.5 mg total) by mouth daily. 45 tablet 3  . multivitamin-lutein (OCUVITE-LUTEIN) CAPS capsule Take 1 capsule by mouth daily.    . nitroGLYCERIN (NITROSTAT) 0.4 MG SL tablet Place 1 tablet (0.4 mg total) under the tongue every 5 (five) minutes x 3 doses as needed for chest pain. 25 tablet 3  . ticagrelor (BRILINTA) 90 MG TABS tablet Take 1 tablet (90 mg total) by mouth 2 (two) times daily. 180 tablet 3   No current facility-administered medications on file prior to visit.    Allergies  Allergen Reactions  . Aleve [Naproxen Sodium] Nausea And Vomiting    No problem with other NSAIDs  . Morphine And Related     dizzy  . Penicillins     Diarrhea     Social History   Socioeconomic History  . Marital status: Married    Spouse name: Not on file  . Number of children: Not on file  . Years of education: Not on file  . Highest education level: Not on file  Occupational History  . Not on file  Social Needs  . Financial resource strain: Not on file  . Food insecurity:    Worry: Not on file    Inability: Not on file  . Transportation needs:    Medical: Not on file    Non-medical: Not on file  Tobacco Use  . Smoking status: Never Smoker  . Smokeless tobacco: Never Used  Substance and Sexual Activity  . Alcohol  use: Yes    Comment: occasional  . Drug use: No  . Sexual activity: Yes    Comment: married  Lifestyle  . Physical activity:    Days per week: Not on file    Minutes per session: Not on file  . Stress: Not on file  Relationships  . Social connections:    Talks on phone: Not on file    Gets together: Not on file    Attends religious service: Not on file    Active member of club or organization: Not on file    Attends meetings of clubs or organizations: Not on file    Relationship status: Not on file  . Intimate partner violence:    Fear of current or ex partner: Not on file    Emotionally abused: Not on file    Physically abused: Not on file    Forced sexual activity: Not on file  Other Topics Concern  . Not on file  Social History Narrative  . Not on file   Family History  Problem Relation Age of Onset  . Diabetes Sister   . Diabetes Brother   . Diabetes Sister   . Cancer Sister        breast      Review of Systems  All other systems reviewed and are negative.      Objective:   Physical Exam  Constitutional: He is oriented to person, place, and time. He appears well-developed and well-nourished. No distress.  Cardiovascular: Normal rate, regular rhythm, normal heart sounds and intact distal pulses. Exam reveals no gallop and no friction rub.  No murmur heard. Pulmonary/Chest: Effort normal  and breath sounds normal. No respiratory distress. He has no wheezes. He has no rales. He exhibits no tenderness.  Abdominal: Soft. Bowel sounds are normal. He exhibits no distension and no mass. There is no tenderness. There is no rebound and no guarding.  Neurological: He is alert and oriented to person, place, and time. No cranial nerve deficit. Coordination normal.  Skin: He is not diaphoretic.  Vitals reviewed.         Assessment & Plan:  History of ST elevation myocardial infarction (STEMI)  Coronary artery disease involving native coronary artery of native heart without angina pectoris  Ischemic cardiomyopathy  I would up the dose of losartan to 25 mg a day and gradually titrate to the highest effective dose that he can tolerate.  I cautioned the patient to watch for hypotension and dizziness.  He had his BMP rechecked 1 week after starting the medication on August 26 and his potassium and creatinine were stable.

## 2017-12-09 ENCOUNTER — Telehealth (HOSPITAL_COMMUNITY): Payer: Self-pay

## 2017-12-14 ENCOUNTER — Other Ambulatory Visit: Payer: PPO

## 2017-12-15 ENCOUNTER — Telehealth (HOSPITAL_COMMUNITY): Payer: Self-pay | Admitting: Pharmacist

## 2017-12-15 NOTE — Telephone Encounter (Signed)
Cardiac Rehab Medication Review by a Pharmacist  Does the patient  feel that his/her medications are working for him/her?  yes  Has the patient been experiencing any side effects to the medications prescribed?  no  Does the patient measure his/her own blood pressure or blood glucose at home?  yes   Does the patient have any problems obtaining medications due to transportation or finances?   no  Understanding of regimen: good Understanding of indications: good Potential of compliance: good    Pharmacist comments: Patient states blood pressure normally runs around 114-136/65-77   Gwenlyn Found, Sherian Rein D PGY1 Pharmacy Resident  Phone [phone] 12/15/2017   4:00 PM

## 2017-12-22 ENCOUNTER — Encounter (HOSPITAL_COMMUNITY)
Admission: RE | Admit: 2017-12-22 | Discharge: 2017-12-22 | Disposition: A | Discharge status: Home / Self Care | Payer: PPO | Source: Ambulatory Visit | Attending: Cardiovascular Disease | Admitting: Cardiovascular Disease

## 2017-12-22 ENCOUNTER — Encounter (HOSPITAL_COMMUNITY): Payer: Self-pay

## 2017-12-22 VITALS — BP 118/68 | HR 67 | Ht 68.0 in | Wt 150.1 lb

## 2017-12-22 DIAGNOSIS — Z955 Presence of coronary angioplasty implant and graft: Secondary | ICD-10-CM | POA: Insufficient documentation

## 2017-12-22 DIAGNOSIS — I213 ST elevation (STEMI) myocardial infarction of unspecified site: Secondary | ICD-10-CM | POA: Diagnosis not present

## 2017-12-22 NOTE — Progress Notes (Signed)
Cardiac Individual Treatment Plan  Patient Details  Name: Eugene [name] MRN: [id number] Date of Birth: December 19, 1931 Referring Provider:     [place] from 12/22/2017 in [place]  Referring Provider  Lauree Chandler, MD      Initial Encounter Date:    CARDIAC REHAB PHASE II ORIENTATION from 12/22/2017 in [place]  Date  12/22/17      Visit Diagnosis: 10/07/2017 ST elevation myocardial infarction (STEMI), unspecified artery ([place])  10/07/2017 Stented coronary artery, S/P DES LAD  Patient's Home Medications on Admission:  Current Outpatient Medications:  .  aspirin 81 MG tablet, Take 81 mg by mouth at bedtime., Disp: , Rfl:  .  atorvastatin (LIPITOR) 80 MG tablet, Take 1 tablet (80 mg total) by mouth daily at 6 PM., Disp: 90 tablet, Rfl: 3 .  carvedilol (COREG) 3.125 MG tablet, Take 1 tablet (3.125 mg total) by mouth 2 (two) times daily with a meal., Disp: 180 tablet, Rfl: 3 .  cholestyramine (QUESTRAN) 4 GM/DOSE powder, USE 1 SCOOP 3 TIMES A DAY WITH A MEAL, Disp: 378 g, Rfl: 12 .  losartan (COZAAR) 25 MG tablet, Take 0.5 tablets (12.5 mg total) by mouth daily., Disp: 45 tablet, Rfl: 3 .  multivitamin-lutein (OCUVITE-LUTEIN) CAPS capsule, Take 1 capsule by mouth daily., Disp: , Rfl:  .  nitroGLYCERIN (NITROSTAT) 0.4 MG SL tablet, Place 1 tablet (0.4 mg total) under the tongue every 5 (five) minutes x 3 doses as needed for chest pain., Disp: 25 tablet, Rfl: 3 .  ticagrelor (BRILINTA) 90 MG TABS tablet, Take 1 tablet (90 mg total) by mouth 2 (two) times daily., Disp: 180 tablet, Rfl: 3  Past Medical History: Past Medical History:  Diagnosis Date  . Amputated right leg ([place])   . CAD (coronary artery disease)    a. 09/2017 - acute MI -  emergent cath showing 99% stenosis in midLAD with thrombotic stenosis at the bifurcation of the second diagonal, which was relatively small in diameter. He  underwent placement of DES to midLAD which jailed the second diagonal.   . Chronic combined systolic and diastolic heart failure ([place])   . Collagenous colitis    Dr. Festus Holts  . History of ST elevation myocardial infarction (STEMI)    09/2017: LAD  . History of stomach ulcers   . Hyperlipidemia LDL goal <70   . Ischemic cardiomyopathy     Tobacco Use: Social History   Tobacco Use  Smoking Status Former Smoker  . Packs/day: 1.00  . Years: 8.00  . Pack years: 8.00  . Types: Cigarettes  Smokeless Tobacco Never Used    Labs: Recent Review Flowsheet Data    Labs for ITP Cardiac and Pulmonary Rehab Latest Ref Rng & Units 10/23/2015 10/21/2016 10/07/2017 11/03/2017 11/09/2017   Cholestrol 100 - 199 mg/dL 167 166 188 110 112   LDLCALC 0 - 99 mg/dL 81 85 99 51 43   HDL >39 mg/dL 70 68 66 46 52   Trlycerides 0 - 149 mg/dL 79 63 113 57 83   TCO2 22 - 32 mmol/L - - 18(L) - -      Capillary Blood Glucose: No results found for: GLUCAP   Exercise Target Goals: Exercise Program Goal: Individual exercise prescription set using results from initial 6 min walk test and THRR while considering  patient's activity barriers and safety.   Exercise Prescription Goal: Initial exercise prescription builds to 30-45 minutes a day of  aerobic activity, 2-3 days per week.  Home exercise guidelines will be given to patient during program as part of exercise prescription that the participant will acknowledge.  Activity Barriers & Risk Stratification: Activity Barriers & Cardiac Risk Stratification - 12/22/17 0946      Activity Barriers & Cardiac Risk Stratification   Activity Barriers  Assistive Device;Other (comment)    Comments  Rt BKA- wears prosthesis. Prior back surgery.    Cardiac Risk Stratification  High       6 Minute Walk: 6 Minute Walk    Row Name 12/22/17 0856         6 Minute Walk   Phase  Initial     Distance  1333 feet     Walk Time  6 minutes     # of Rest Breaks  0     MPH   2.52     METS  2.4     RPE  12     Perceived Dyspnea   0     VO2 Peak  8.39     Symptoms  No     Resting HR  67 bpm     Resting BP  118/68     Resting Oxygen Saturation   98 %     Exercise Oxygen Saturation  during 6 min walk  99 %     Max Ex. HR  97 bpm     Max Ex. BP  122/68     2 Minute Post BP  108/78        Oxygen Initial Assessment:   Oxygen Re-Evaluation:   Oxygen Discharge (Final Oxygen Re-Evaluation):   Initial Exercise Prescription: Initial Exercise Prescription - 12/22/17 1000      Date of Initial Exercise RX and Referring Provider   Date  12/22/17    Referring Provider  Lauree Chandler, MD    Expected Discharge Date  03/29/18      NuStep   Level  2    SPM  85    Minutes  10    METs  2.5      Arm Ergometer   Level  1    Minutes  10    METs  2      Track   Laps  9    Minutes  10    METs  2.56      Prescription Details   Frequency (times per week)  3    Duration  Progress to 30 minutes of continuous aerobic without signs/symptoms of physical distress      Intensity   THRR 40-80% of Max Heartrate  54-109    Ratings of Perceived Exertion  11-13    Perceived Dyspnea  0-4      Progression   Progression  Continue to progress workloads to maintain intensity without signs/symptoms of physical distress.      Resistance Training   Training Prescription  Yes    Weight  3lbs    Reps  10-15       Perform Capillary Blood Glucose checks as needed.  Exercise Prescription Changes:   Exercise Comments:   Exercise Goals and Review: Exercise Goals    Row Name 12/22/17 0950             Exercise Goals   Increase Physical Activity  Yes       Intervention  Provide advice, education, support and counseling about physical activity/exercise needs.;Develop an individualized exercise prescription for aerobic and resistive training based on  initial evaluation findings, risk stratification, comorbidities and participant's personal goals.        Expected Outcomes  Short Term: Attend rehab on a regular basis to increase amount of physical activity.;Long Term: Exercising regularly at least 3-5 days a week.;Long Term: Add in home exercise to make exercise part of routine and to increase amount of physical activity.       Increase Strength and Stamina  Yes       Intervention  Provide advice, education, support and counseling about physical activity/exercise needs.;Develop an individualized exercise prescription for aerobic and resistive training based on initial evaluation findings, risk stratification, comorbidities and participant's personal goals.       Expected Outcomes  Short Term: Increase workloads from initial exercise prescription for resistance, speed, and METs.;Short Term: Perform resistance training exercises routinely during rehab and add in resistance training at home;Long Term: Improve cardiorespiratory fitness, muscular endurance and strength as measured by increased METs and functional capacity (6MWT)       Able to understand and use rate of perceived exertion (RPE) scale  Yes       Intervention  Provide education and explanation on how to use RPE scale       Expected Outcomes  Short Term: Able to use RPE daily in rehab to express subjective intensity level;Long Term:  Able to use RPE to guide intensity level when exercising independently       Knowledge and understanding of Target Heart Rate Range (THRR)  Yes       Intervention  Provide education and explanation of THRR including how the numbers were predicted and where they are located for reference       Expected Outcomes  Short Term: Able to state/look up THRR;Long Term: Able to use THRR to govern intensity when exercising independently;Short Term: Able to use daily as guideline for intensity in rehab       Able to check pulse independently  Yes       Intervention  Provide education and demonstration on how to check pulse in carotid and radial arteries.;Review the importance of  being able to check your own pulse for safety during independent exercise       Expected Outcomes  Short Term: Able to explain why pulse checking is important during independent exercise;Long Term: Able to check pulse independently and accurately       Understanding of Exercise Prescription  Yes       Intervention  Provide education, explanation, and written materials on patient's individual exercise prescription       Expected Outcomes  Short Term: Able to explain program exercise prescription;Long Term: Able to explain home exercise prescription to exercise independently          Exercise Goals Re-Evaluation :   Discharge Exercise Prescription (Final Exercise Prescription Changes):   Nutrition:  Target Goals: Understanding of nutrition guidelines, daily intake of sodium 1500mg , cholesterol 200mg , calories 30% from fat and 7% or less from saturated fats, daily to have 5 or more servings of fruits and vegetables.  Biometrics: Pre Biometrics - 12/22/17 0841      Pre Biometrics   Height  5\' 8"  (1.727 m)    Weight  68.1 kg    Waist Circumference  35.5 inches    Hip Circumference  37 inches    Waist to Hip Ratio  0.96 %    BMI (Calculated)  22.83    Triceps Skinfold  9 mm    % Body Fat  22.2 %  Grip Strength  30.5 kg    Flexibility  10 in    Single Leg Stand  10.97 seconds        Nutrition Therapy Plan and Nutrition Goals:   Nutrition Assessments:   Nutrition Goals Re-Evaluation:   Nutrition Goals Re-Evaluation:   Nutrition Goals Discharge (Final Nutrition Goals Re-Evaluation):   Psychosocial: Target Goals: Acknowledge presence or absence of significant depression and/or stress, maximize coping skills, provide positive support system. Participant is able to verbalize types and ability to use techniques and skills needed for reducing stress and depression.  Initial Review & Psychosocial Screening: Initial Psych Review & Screening - 12/22/17 0912      Initial  Review   Current issues with  None Identified      Family Dynamics   Good Support System?  Yes   wife, daughters      Barriers   Psychosocial barriers to participate in program  There are no identifiable barriers or psychosocial needs.      Screening Interventions   Interventions  Encouraged to exercise       Quality of Life Scores: Quality of Life - 12/22/17 0912      Quality of Life   Select  Quality of Life      Quality of Life Scores   Health/Function Pre  26.1 %    Socioeconomic Pre  29.2 %    Psych/Spiritual Pre  29.1 %    Family Pre  30 %    GLOBAL Pre  27.9 %      Scores of 19 and below usually indicate a poorer quality of life in these areas.  A difference of  2-3 points is a clinically meaningful difference.  A difference of 2-3 points in the total score of the Quality of Life Index has been associated with significant improvement in overall quality of life, self-image, physical symptoms, and general health in studies assessing change in quality of life.  PHQ-9: Recent Review Flowsheet Data    Depression screen [facility] 2/9 11/03/2017 11/03/2017 04/21/2017 10/28/2016 10/28/2016   Decreased Interest 0 0 1 0 0   Down, Depressed, Hopeless 0 0 1 0 0   PHQ - 2 Score 0 0 2 0 0   Altered sleeping - - 2 - -   Tired, decreased energy - - 1 - -   Change in appetite - - 1 - -   Feeling bad or failure about yourself  - - 0 - -   Trouble concentrating - - 1 - -   Moving slowly or fidgety/restless - - 0 - -   Suicidal thoughts - - 0 - -   PHQ-9 Score - - 7 - -   Difficult doing work/chores - - Somewhat difficult - -     Interpretation of Total Score  Total Score Depression Severity:  1-4 = Minimal depression, 5-9 = Mild depression, 10-14 = Moderate depression, 15-19 = Moderately severe depression, 20-27 = Severe depression   Psychosocial Evaluation and Intervention:   Psychosocial Re-Evaluation:   Psychosocial Discharge (Final Psychosocial Re-Evaluation):   Vocational  Rehabilitation: Provide vocational rehab assistance to qualifying candidates.   Vocational Rehab Evaluation & Intervention: Vocational Rehab - 12/22/17 0913      Initial Vocational Rehab Evaluation & Intervention   Assessment shows need for Vocational Rehabilitation  No   retired Armed forces logistics/support/administrative officer       Education: Education Goals: Education classes will be provided on a weekly basis, covering required topics. Participant will state  understanding/return demonstration of topics presented.  Learning Barriers/Preferences: Learning Barriers/Preferences - 12/22/17 1154      Learning Barriers/Preferences   Learning Barriers  Hearing;Sight   Bilateral hearing aides, reading glasses   Learning Preferences  Skilled Demonstration       Education Topics: Count Your Pulse:  -Group instruction provided by verbal instruction, demonstration, patient participation and written materials to support subject.  Instructors address importance of being able to find your pulse and how to count your pulse when at home without a heart monitor.  Patients get hands on experience counting their pulse with staff help and individually.   Heart Attack, Angina, and Risk Factor Modification:  -Group instruction provided by verbal instruction, video, and written materials to support subject.  Instructors address signs and symptoms of angina and heart attacks.    Also discuss risk factors for heart disease and how to make changes to improve heart health risk factors.   Functional Fitness:  -Group instruction provided by verbal instruction, demonstration, patient participation, and written materials to support subject.  Instructors address safety measures for doing things around the house.  Discuss how to get up and down off the floor, how to pick things up properly, how to safely get out of a chair without assistance, and balance training.   Meditation and Mindfulness:  -Group instruction provided by verbal instruction,  patient participation, and written materials to support subject.  Instructor addresses importance of mindfulness and meditation practice to help reduce stress and improve awareness.  Instructor also leads participants through a meditation exercise.    Stretching for Flexibility and Mobility:  -Group instruction provided by verbal instruction, patient participation, and written materials to support subject.  Instructors lead participants through series of stretches that are designed to increase flexibility thus improving mobility.  These stretches are additional exercise for major muscle groups that are typically performed during regular warm up and cool down.   Hands Only CPR:  -Group verbal, video, and participation provides a basic overview of AHA guidelines for community CPR. Role-play of emergencies allow participants the opportunity to practice calling for help and chest compression technique with discussion of AED use.   Hypertension: -Group verbal and written instruction that provides a basic overview of hypertension including the most recent diagnostic guidelines, risk factor reduction with self-care instructions and medication management.    Nutrition I class: Heart Healthy Eating:  -Group instruction provided by PowerPoint slides, verbal discussion, and written materials to support subject matter. The instructor gives an explanation and review of the Therapeutic Lifestyle Changes diet recommendations, which includes a discussion on lipid goals, dietary fat, sodium, fiber, plant stanol/sterol esters, sugar, and the components of a well-balanced, healthy diet.   Nutrition II class: Lifestyle Skills:  -Group instruction provided by PowerPoint slides, verbal discussion, and written materials to support subject matter. The instructor gives an explanation and review of label reading, grocery shopping for heart health, heart healthy recipe modifications, and ways to make healthier choices  when eating out.   Diabetes Question & Answer:  -Group instruction provided by PowerPoint slides, verbal discussion, and written materials to support subject matter. The instructor gives an explanation and review of diabetes co-morbidities, pre- and post-prandial blood glucose goals, pre-exercise blood glucose goals, signs, symptoms, and treatment of hypoglycemia and hyperglycemia, and foot care basics.   Diabetes Blitz:  -Group instruction provided by PowerPoint slides, verbal discussion, and written materials to support subject matter. The instructor gives an explanation and review of the physiology behind type  1 and type 2 diabetes, diabetes medications and rational behind using different medications, pre- and post-prandial blood glucose recommendations and Hemoglobin A1c goals, diabetes diet, and exercise including blood glucose guidelines for exercising safely.    Portion Distortion:  -Group instruction provided by PowerPoint slides, verbal discussion, written materials, and food models to support subject matter. The instructor gives an explanation of serving size versus portion size, changes in portions sizes over the last 20 years, and what consists of a serving from each food group.   Stress Management:  -Group instruction provided by verbal instruction, video, and written materials to support subject matter.  Instructors review role of stress in heart disease and how to cope with stress positively.     Exercising on Your Own:  -Group instruction provided by verbal instruction, power point, and written materials to support subject.  Instructors discuss benefits of exercise, components of exercise, frequency and intensity of exercise, and end points for exercise.  Also discuss use of nitroglycerin and activating EMS.  Review options of places to exercise outside of rehab.  Review guidelines for sex with heart disease.   Cardiac Drugs I:  -Group instruction provided by verbal  instruction and written materials to support subject.  Instructor reviews cardiac drug classes: antiplatelets, anticoagulants, beta blockers, and statins.  Instructor discusses reasons, side effects, and lifestyle considerations for each drug class.   Cardiac Drugs II:  -Group instruction provided by verbal instruction and written materials to support subject.  Instructor reviews cardiac drug classes: angiotensin converting enzyme inhibitors (ACE-I), angiotensin II receptor blockers (ARBs), nitrates, and calcium channel blockers.  Instructor discusses reasons, side effects, and lifestyle considerations for each drug class.   Anatomy and Physiology of the Circulatory System:  Group verbal and written instruction and models provide basic cardiac anatomy and physiology, with the coronary electrical and arterial systems. Review of: AMI, Angina, Valve disease, Heart Failure, Peripheral Artery Disease, Cardiac Arrhythmia, Pacemakers, and the ICD.   Other Education:  -Group or individual verbal, written, or video instructions that support the educational goals of the cardiac rehab program.   Holiday Eating Survival Tips:  -Group instruction provided by PowerPoint slides, verbal discussion, and written materials to support subject matter. The instructor gives patients tips, tricks, and techniques to help them not only survive but enjoy the holidays despite the onslaught of food that accompanies the holidays.   Knowledge Questionnaire Score: Knowledge Questionnaire Score - 12/22/17 0914      Knowledge Questionnaire Score   Pre Score  13/24       Core Components/Risk Factors/Patient Goals at Admission: Personal Goals and Risk Factors at Admission - 12/22/17 1159      Core Components/Risk Factors/Patient Goals on Admission    Weight Management  Weight Gain;Yes    Intervention  Weight Management: Develop a combined nutrition and exercise program designed to reach desired caloric intake, while  maintaining appropriate intake of nutrient and fiber, sodium and fats, and appropriate energy expenditure required for the weight goal.;Weight Management: Provide education and appropriate resources to help participant work on and attain dietary goals.    Admit Weight  150 lb 2.1 oz (68.1 kg)    Expected Outcomes  Weight Gain: Understanding of general recommendations for a high calorie, high protein meal plan that promotes weight gain by distributing calorie intake throughout the day with the consumption for 4-5 meals, snacks, and/or supplements;Short Term: Continue to assess and modify interventions until short term weight is achieved;Long Term: Adherence to nutrition and physical activity/exercise  program aimed toward attainment of established weight goal    Heart Failure  Yes    Intervention  Provide a combined exercise and nutrition program that is supplemented with education, support and counseling about heart failure. Directed toward relieving symptoms such as shortness of breath, decreased exercise tolerance, and extremity edema.    Expected Outcomes  Improve functional capacity of life;Short term: Attendance in program 2-3 days a week with increased exercise capacity. Reported lower sodium intake. Reported increased fruit and vegetable intake. Reports medication compliance.;Short term: Daily weights obtained and reported for increase. Utilizing diuretic protocols set by physician.;Long term: Adoption of self-care skills and reduction of barriers for early signs and symptoms recognition and intervention leading to self-care maintenance.    Lipids  Yes    Intervention  Provide education and support for participant on nutrition & aerobic/resistive exercise along with prescribed medications to achieve LDL 70mg , HDL >40mg .    Expected Outcomes  Short Term: Participant states understanding of desired cholesterol values and is compliant with medications prescribed. Participant is following exercise  prescription and nutrition guidelines.;Long Term: Cholesterol controlled with medications as prescribed, with individualized exercise RX and with personalized nutrition plan. Value goals: LDL < 70mg , HDL > 40 mg.    Personal Goal Other  Yes    Personal Goal  Participant would like to be able to walk with less SOB.    Intervention  Provide aerobic exercise program to help improve exercise tolerance, energy, and stamina and decrease breathlessness with walking.    Expected Outcomes  Patient will increase cardiorespiratory fitness through regular exercise routine and be less SOB with walking.       Core Components/Risk Factors/Patient Goals Review:    Core Components/Risk Factors/Patient Goals at Discharge (Final Review):    ITP Comments: ITP Comments    Row Name 12/22/17 0856           ITP Comments  Dr. Fransico Him, Medical Director           Comments: Eugene [name] attended orientation from [phone] to [phone] to review rules and guidelines for program. Completed 6 minute walk test, Intitial ITP, and exercise prescription.  VSS. Telemetry-Sinus Rhythm with occasional multifocal PVC's.  Asymptomatic.Eugene [name] wear a prosthesis for his Right BKA.Barnet Pall, RN,BSN 12/22/2017 12:07 PM

## 2017-12-28 ENCOUNTER — Encounter (HOSPITAL_COMMUNITY)
Admission: RE | Admit: 2017-12-28 | Discharge: 2017-12-28 | Disposition: A | Discharge status: Home / Self Care | Payer: PPO | Source: Ambulatory Visit | Attending: Cardiovascular Disease | Admitting: Cardiovascular Disease

## 2017-12-28 ENCOUNTER — Encounter (HOSPITAL_COMMUNITY): Payer: PPO

## 2017-12-28 DIAGNOSIS — Z955 Presence of coronary angioplasty implant and graft: Secondary | ICD-10-CM

## 2017-12-28 DIAGNOSIS — I213 ST elevation (STEMI) myocardial infarction of unspecified site: Secondary | ICD-10-CM

## 2017-12-28 NOTE — Progress Notes (Signed)
GRAYER SPROLES 84 y.o. male DOB 1931/09/08 MRN [id number]       Nutrition  1. 10/07/2017 ST elevation myocardial infarction (STEMI), unspecified artery ([place])   2. 10/07/2017 Stented coronary artery, S/P DES LAD    Past Medical History:  Diagnosis Date  . Amputated right leg ([place])   . CAD (coronary artery disease)    a. 09/2017 - acute MI -  emergent cath showing 99% stenosis in midLAD with thrombotic stenosis at the bifurcation of the second diagonal, which was relatively small in diameter. He underwent placement of DES to midLAD which jailed the second diagonal.   . Chronic combined systolic and diastolic heart failure ([place])   . Collagenous colitis    Dr. Festus Holts  . History of ST elevation myocardial infarction (STEMI)    09/2017: LAD  . History of stomach ulcers   . Hyperlipidemia LDL goal <70   . Ischemic cardiomyopathy    Meds reviewed.     Current Outpatient Medications (Cardiovascular):  .  atorvastatin (LIPITOR) 80 MG tablet, Take 1 tablet (80 mg total) by mouth daily at 6 PM. .  carvedilol (COREG) 3.125 MG tablet, Take 1 tablet (3.125 mg total) by mouth 2 (two) times daily with a meal. .  cholestyramine (QUESTRAN) 4 GM/DOSE powder, USE 1 SCOOP 3 TIMES A DAY WITH A MEAL .  losartan (COZAAR) 25 MG tablet, Take 0.5 tablets (12.5 mg total) by mouth daily. .  nitroGLYCERIN (NITROSTAT) 0.4 MG SL tablet, Place 1 tablet (0.4 mg total) under the tongue every 5 (five) minutes x 3 doses as needed for chest pain.   Current Outpatient Medications (Analgesics):  .  aspirin 81 MG tablet, Take 81 mg by mouth at bedtime.  Current Outpatient Medications (Hematological):  .  ticagrelor (BRILINTA) 90 MG TABS tablet, Take 1 tablet (90 mg total) by mouth 2 (two) times daily.  Current Outpatient Medications (Other):  .  multivitamin-lutein (OCUVITE-LUTEIN) CAPS capsule, Take 1 capsule by mouth daily.   HT: Ht Readings from Last 1 Encounters:  12/22/17 5\' 8"  (1.727 m)    WT: Wt  Readings from Last 5 Encounters:  12/22/17 150 lb 2.1 oz (68.1 kg)  11/23/17 155 lb (70.3 kg)  11/03/17 150 lb (68 kg)  11/02/17 152 lb (68.9 kg)  10/10/17 148 lb 4.8 oz (67.3 kg)     Body mass index is 22.83 kg/m.   Current tobacco use? No       Labs:  Lipid Panel     Component Value Date/Time   CHOL 112 11/09/2017 1440   TRIG 83 11/09/2017 1440   HDL 52 11/09/2017 1440   CHOLHDL 2.2 11/09/2017 1440   CHOLHDL 2.4 11/03/2017 1004   VLDL 23 10/07/2017 1628   LDLCALC 43 11/09/2017 1440   LDLCALC 51 11/03/2017 1004    No results found for: HGBA1C CBG (last 3)  No results for input(s): GLUCAP in the last 72 hours.  Nutrition Diagnosis ? Food-and nutrition-related knowledge deficit related to lack of exposure to information as related to diagnosis of: ? CVD  ?   Nutrition Goal(s):  ? To be determined  Plan:  Pt to attend nutrition classes ? Nutrition I ? Nutrition II ? Portion Distortion  Will provide client-centered nutrition education as part of interdisciplinary care.   Monitor and evaluate progress toward nutrition goal with team.  Laurina Bustle, MS, RD, LDN 12/28/2017 11:44 AM

## 2017-12-28 NOTE — Progress Notes (Signed)
Daily Session Note  Patient Details  Name: SANEL STEMMER MRN: [id number] Date of Birth: [birth year] Referring Provider:     [place] from 12/22/2017 in [place]  Referring Provider  Lauree Chandler, MD      Encounter Date: 12/28/2017  Check In: Session Check In - 12/28/17 1042      Check-In   Supervising physician immediately available to respond to emergencies  [facility] immediately available    Physician(s)  Dr. Herbert Moors    Location  [facility]    Staff Present  Barnet Pall, RN, Toma Deiters, RN, BSN;Brittany Durene Fruits, BS, ACSM CEP, Exercise Physiologist;Olinty Celesta Aver, MS, ACSM CEP, Exercise Physiologist;Tyara Carol Ada, MS,ACSM CEP, Exercise Physiologist    Medication changes reported      No    Fall or balance concerns reported     No    Tobacco Cessation  No Change    Warm-up and Cool-down  Performed as group-led instruction    Resistance Training Performed  Yes    VAD Patient?  No    PAD/SET Patient?  No      Pain Assessment   Currently in Pain?  No/denies       Capillary Blood Glucose: No results found for this or any previous visit (from the past 24 hour(s)).    Social History   Tobacco Use  Smoking Status Former Smoker  . Packs/day: 1.00  . Years: 8.00  . Pack years: 8.00  . Types: Cigarettes  Smokeless Tobacco Never Used    Goals Met:  No report of cardiac concerns or symptoms  Goals Unmet:  Not Applicable  Comments: Pt started cardiac rehab today.  Pt tolerated light exercise without difficulty. VSS, telemetry-Sinus Rhythm, asymptomatic.  Medication list reconciled. Pt denies barriers to medicaiton compliance.  PSYCHOSOCIAL ASSESSMENT:  PHQ-0. Pt exhibits positive coping skills, hopeful outlook with supportive family. No psychosocial needs identified at this time, no psychosocial interventions necessary.    Pt enjoys staying active and doing yard work.   Pt  oriented to exercise equipment and routine.    Understanding verbalized.Barnet Pall, RN,BSN 12/28/2017 10:48 AM   Dr. Fransico Him is Medical Director for Cardiac Rehab at [facility].

## 2017-12-30 ENCOUNTER — Encounter (HOSPITAL_COMMUNITY)
Admission: RE | Admit: 2017-12-30 | Discharge: 2017-12-30 | Disposition: A | Discharge status: Home / Self Care | Payer: PPO | Source: Ambulatory Visit | Attending: Cardiovascular Disease | Admitting: Cardiovascular Disease

## 2017-12-30 ENCOUNTER — Encounter (HOSPITAL_COMMUNITY): Payer: PPO

## 2017-12-30 DIAGNOSIS — I213 ST elevation (STEMI) myocardial infarction of unspecified site: Secondary | ICD-10-CM | POA: Diagnosis not present

## 2017-12-30 DIAGNOSIS — Z955 Presence of coronary angioplasty implant and graft: Secondary | ICD-10-CM

## 2018-01-01 ENCOUNTER — Encounter (HOSPITAL_COMMUNITY): Payer: PPO

## 2018-01-01 ENCOUNTER — Encounter (HOSPITAL_COMMUNITY)
Admission: RE | Admit: 2018-01-01 | Discharge: 2018-01-01 | Disposition: A | Discharge status: Home / Self Care | Payer: PPO | Source: Ambulatory Visit | Attending: Cardiovascular Disease | Admitting: Cardiovascular Disease

## 2018-01-01 DIAGNOSIS — Z955 Presence of coronary angioplasty implant and graft: Secondary | ICD-10-CM

## 2018-01-01 DIAGNOSIS — I213 ST elevation (STEMI) myocardial infarction of unspecified site: Secondary | ICD-10-CM | POA: Diagnosis not present

## 2018-01-04 ENCOUNTER — Encounter (HOSPITAL_COMMUNITY): Payer: PPO

## 2018-01-04 ENCOUNTER — Encounter (HOSPITAL_COMMUNITY)
Admission: RE | Admit: 2018-01-04 | Discharge: 2018-01-04 | Disposition: A | Discharge status: Home / Self Care | Payer: PPO | Source: Ambulatory Visit | Attending: Cardiovascular Disease | Admitting: Cardiovascular Disease

## 2018-01-04 DIAGNOSIS — I213 ST elevation (STEMI) myocardial infarction of unspecified site: Secondary | ICD-10-CM | POA: Diagnosis not present

## 2018-01-04 DIAGNOSIS — Z955 Presence of coronary angioplasty implant and graft: Secondary | ICD-10-CM

## 2018-01-04 DIAGNOSIS — H903 Sensorineural hearing loss, bilateral: Secondary | ICD-10-CM | POA: Diagnosis not present

## 2018-01-04 NOTE — Progress Notes (Signed)
Reviewed home exercise guidelines with patient including endpoints, temperature precautions, target heart rate and rate of perceived exertion. [facility] and will try to walk at home 1 day/week as his mode of home exercise. Pt voices understanding of instructions given. Sol Passer, MS, ACSM CEP

## 2018-01-06 ENCOUNTER — Encounter (HOSPITAL_COMMUNITY)
Admission: RE | Admit: 2018-01-06 | Discharge: 2018-01-06 | Disposition: A | Discharge status: Home / Self Care | Payer: PPO | Source: Ambulatory Visit | Attending: Cardiovascular Disease | Admitting: Cardiovascular Disease

## 2018-01-06 ENCOUNTER — Encounter (HOSPITAL_COMMUNITY): Payer: PPO

## 2018-01-06 DIAGNOSIS — I213 ST elevation (STEMI) myocardial infarction of unspecified site: Secondary | ICD-10-CM | POA: Diagnosis not present

## 2018-01-06 DIAGNOSIS — Z955 Presence of coronary angioplasty implant and graft: Secondary | ICD-10-CM

## 2018-01-08 ENCOUNTER — Encounter (HOSPITAL_COMMUNITY)
Admission: RE | Admit: 2018-01-08 | Discharge: 2018-01-08 | Disposition: A | Discharge status: Home / Self Care | Payer: PPO | Source: Ambulatory Visit | Attending: Cardiovascular Disease | Admitting: Cardiovascular Disease

## 2018-01-08 ENCOUNTER — Encounter (HOSPITAL_COMMUNITY): Payer: PPO

## 2018-01-08 DIAGNOSIS — H353132 Nonexudative age-related macular degeneration, bilateral, intermediate dry stage: Secondary | ICD-10-CM | POA: Diagnosis not present

## 2018-01-08 DIAGNOSIS — I213 ST elevation (STEMI) myocardial infarction of unspecified site: Secondary | ICD-10-CM | POA: Diagnosis not present

## 2018-01-08 DIAGNOSIS — Z955 Presence of coronary angioplasty implant and graft: Secondary | ICD-10-CM

## 2018-01-08 DIAGNOSIS — H35371 Puckering of macula, right eye: Secondary | ICD-10-CM | POA: Diagnosis not present

## 2018-01-11 ENCOUNTER — Encounter (HOSPITAL_COMMUNITY)
Admission: RE | Admit: 2018-01-11 | Discharge: 2018-01-11 | Disposition: A | Discharge status: Home / Self Care | Payer: PPO | Source: Ambulatory Visit | Attending: Cardiovascular Disease | Admitting: Cardiovascular Disease

## 2018-01-11 ENCOUNTER — Encounter (HOSPITAL_COMMUNITY): Payer: PPO

## 2018-01-11 DIAGNOSIS — I213 ST elevation (STEMI) myocardial infarction of unspecified site: Secondary | ICD-10-CM

## 2018-01-11 DIAGNOSIS — Z955 Presence of coronary angioplasty implant and graft: Secondary | ICD-10-CM

## 2018-01-13 ENCOUNTER — Encounter (HOSPITAL_COMMUNITY): Payer: PPO

## 2018-01-13 ENCOUNTER — Encounter (HOSPITAL_COMMUNITY)
Admission: RE | Admit: 2018-01-13 | Discharge: 2018-01-13 | Disposition: A | Discharge status: Home / Self Care | Payer: PPO | Source: Ambulatory Visit | Attending: Cardiovascular Disease | Admitting: Cardiovascular Disease

## 2018-01-13 DIAGNOSIS — I213 ST elevation (STEMI) myocardial infarction of unspecified site: Secondary | ICD-10-CM

## 2018-01-13 DIAGNOSIS — Z955 Presence of coronary angioplasty implant and graft: Secondary | ICD-10-CM

## 2018-01-14 NOTE — Progress Notes (Signed)
Cardiac Individual Treatment Plan  Patient Details  Name: Eugene [name] MRN: [id number] Date of Birth: 08-01-1931 Referring Provider:     [facility] from 12/22/2017 in [place]  Referring Provider  Lauree Chandler, MD      Initial Encounter Date:    CARDIAC REHAB PHASE II ORIENTATION from 12/22/2017 in [place]  Date  12/22/17      Visit Diagnosis: 10/07/2017 ST elevation myocardial infarction (STEMI), unspecified artery ([place])  10/07/2017 Stented coronary artery, S/P DES LAD  Patient's Home Medications on Admission:  Current Outpatient Medications:  .  aspirin 81 MG tablet, Take 81 mg by mouth at bedtime., Disp: , Rfl:  .  atorvastatin (LIPITOR) 80 MG tablet, Take 1 tablet (80 mg total) by mouth daily at 6 PM., Disp: 90 tablet, Rfl: 3 .  carvedilol (COREG) 3.125 MG tablet, Take 1 tablet (3.125 mg total) by mouth 2 (two) times daily with a meal., Disp: 180 tablet, Rfl: 3 .  cholestyramine (QUESTRAN) 4 GM/DOSE powder, USE 1 SCOOP 3 TIMES A DAY WITH A MEAL, Disp: 378 g, Rfl: 12 .  losartan (COZAAR) 25 MG tablet, Take 0.5 tablets (12.5 mg total) by mouth daily., Disp: 45 tablet, Rfl: 3 .  multivitamin-lutein (OCUVITE-LUTEIN) CAPS capsule, Take 1 capsule by mouth daily., Disp: , Rfl:  .  nitroGLYCERIN (NITROSTAT) 0.4 MG SL tablet, Place 1 tablet (0.4 mg total) under the tongue every 5 (five) minutes x 3 doses as needed for chest pain., Disp: 25 tablet, Rfl: 3 .  ticagrelor (BRILINTA) 90 MG TABS tablet, Take 1 tablet (90 mg total) by mouth 2 (two) times daily., Disp: 180 tablet, Rfl: 3  Past Medical History: Past Medical History:  Diagnosis Date  . Amputated right leg ([place])   . CAD (coronary artery disease)    a. 09/2017 - acute MI -  emergent cath showing 99% stenosis in midLAD with thrombotic stenosis at the bifurcation of the second diagonal, which was relatively small in diameter. He  underwent placement of DES to midLAD which jailed the second diagonal.   . Chronic combined systolic and diastolic heart failure ([place])   . Collagenous colitis    Dr. Festus Holts  . History of ST elevation myocardial infarction (STEMI)    09/2017: LAD  . History of stomach ulcers   . Hyperlipidemia LDL goal <70   . Ischemic cardiomyopathy     Tobacco Use: Social History   Tobacco Use  Smoking Status Former Smoker  . Packs/day: 1.00  . Years: 8.00  . Pack years: 8.00  . Types: Cigarettes  Smokeless Tobacco Never Used    Labs: Recent Review Flowsheet Data    Labs for ITP Cardiac and Pulmonary Rehab Latest Ref Rng & Units 10/23/2015 10/21/2016 10/07/2017 11/03/2017 11/09/2017   Cholestrol 100 - 199 mg/dL 167 166 188 110 112   LDLCALC 0 - 99 mg/dL 81 85 99 51 43   HDL >39 mg/dL 70 68 66 46 52   Trlycerides 0 - 149 mg/dL 79 63 113 57 83   TCO2 22 - 32 mmol/L - - 18(L) - -      Capillary Blood Glucose: No results found for: GLUCAP   Exercise Target Goals: Exercise Program Goal: Individual exercise prescription set using results from initial 6 min walk test and THRR while considering  patient's activity barriers and safety.   Exercise Prescription Goal: Initial exercise prescription builds to 30-45 minutes a day of  aerobic activity, 2-3 days per week.  Home exercise guidelines will be given to patient during program as part of exercise prescription that the participant will acknowledge.  Activity Barriers & Risk Stratification: Activity Barriers & Cardiac Risk Stratification - 12/22/17 0946      Activity Barriers & Cardiac Risk Stratification   Activity Barriers  Assistive Device;Other (comment)    Comments  Rt BKA- wears prosthesis. Prior back surgery.    Cardiac Risk Stratification  High       6 Minute Walk: 6 Minute Walk    Row Name 12/22/17 0856         6 Minute Walk   Phase  Initial     Distance  1333 feet     Walk Time  6 minutes     # of Rest Breaks  0     MPH   2.52     METS  2.4     RPE  12     Perceived Dyspnea   0     VO2 Peak  8.39     Symptoms  No     Resting HR  67 bpm     Resting BP  118/68     Resting Oxygen Saturation   98 %     Exercise Oxygen Saturation  during 6 min walk  99 %     Max Ex. HR  97 bpm     Max Ex. BP  122/68     2 Minute Post BP  108/78        Oxygen Initial Assessment:   Oxygen Re-Evaluation:   Oxygen Discharge (Final Oxygen Re-Evaluation):   Initial Exercise Prescription: Initial Exercise Prescription - 12/22/17 1000      Date of Initial Exercise RX and Referring Provider   Date  12/22/17    Referring Provider  Lauree Chandler, MD    Expected Discharge Date  03/29/18      NuStep   Level  2    SPM  85    Minutes  10    METs  2.5      Arm Ergometer   Level  1    Minutes  10    METs  2      Track   Laps  9    Minutes  10    METs  2.56      Prescription Details   Frequency (times per week)  3    Duration  Progress to 30 minutes of continuous aerobic without signs/symptoms of physical distress      Intensity   THRR 40-80% of Max Heartrate  54-109    Ratings of Perceived Exertion  11-13    Perceived Dyspnea  0-4      Progression   Progression  Continue to progress workloads to maintain intensity without signs/symptoms of physical distress.      Resistance Training   Training Prescription  Yes    Weight  3lbs    Reps  10-15       Perform Capillary Blood Glucose checks as needed.  Exercise Prescription Changes: Exercise Prescription Changes    Row Name 12/28/17 0954 01/04/18 0952           Response to Exercise   Blood Pressure (Admit)  112/72  120/70      Blood Pressure (Exercise)  124/80  128/62      Blood Pressure (Exit)  108/72  108/78      Heart Rate (Admit)  73 bpm  75 bpm      Heart Rate (Exercise)  90 bpm  89 bpm      Heart Rate (Exit)  73 bpm  75 bpm      Rating of Perceived Exertion (Exercise)  12  13      Symptoms  none  none      Duration  Progress to  30 minutes of  aerobic without signs/symptoms of physical distress  Progress to 30 minutes of  aerobic without signs/symptoms of physical distress      Intensity  THRR unchanged  THRR unchanged        Progression   Progression  Continue to progress workloads to maintain intensity without signs/symptoms of physical distress.  Continue to progress workloads to maintain intensity without signs/symptoms of physical distress.      Average METs  2.4  2.5        Resistance Training   Training Prescription  Yes  Yes      Weight  3lbs  3lbs      Reps  10-15  10-15      Time  10 Minutes  10 Minutes        Interval Training   Interval Training  No  No        NuStep   Level  2  5      SPM  85  85      Minutes  10  10      METs  1.9  1.9        Arm Ergometer   Level  1  2      Minutes  10  10        Track   Laps  11  12      Minutes  10  10      METs  2.91  2.91        Home Exercise Plan   Plans to continue exercise at  [facility] (comment)      Frequency  -  Add 1 additional day to program exercise sessions.      Initial Home Exercises Provided  -  01/04/18         Exercise Comments: Exercise Comments    Row Name 12/28/17 1052 01/04/18 1018 01/06/18 1030       Exercise Comments  Patient tolerated first session of exercise well without symptoms.  Reviewed home exercise guidelines, METs, and goals with patient.  Reviewed METs and goals with patient.        Exercise Goals and Review: Exercise Goals    Row Name 12/22/17 0950             Exercise Goals   Increase Physical Activity  Yes       Intervention  Provide advice, education, support and counseling about physical activity/exercise needs.;Develop an individualized exercise prescription for aerobic and resistive training based on initial evaluation findings, risk stratification, comorbidities and participant's personal goals.       Expected Outcomes  Short Term: Attend rehab on a regular basis to increase  amount of physical activity.;Long Term: Exercising regularly at least 3-5 days a week.;Long Term: Add in home exercise to make exercise part of routine and to increase amount of physical activity.       Increase Strength and Stamina  Yes       Intervention  Provide advice, education, support and counseling about physical activity/exercise needs.;Develop an individualized exercise prescription for aerobic and resistive training  based on initial evaluation findings, risk stratification, comorbidities and participant's personal goals.       Expected Outcomes  Short Term: Increase workloads from initial exercise prescription for resistance, speed, and METs.;Short Term: Perform resistance training exercises routinely during rehab and add in resistance training at home;Long Term: Improve cardiorespiratory fitness, muscular endurance and strength as measured by increased METs and functional capacity (6MWT)       Able to understand and use rate of perceived exertion (RPE) scale  Yes       Intervention  Provide education and explanation on how to use RPE scale       Expected Outcomes  Short Term: Able to use RPE daily in rehab to express subjective intensity level;Long Term:  Able to use RPE to guide intensity level when exercising independently       Knowledge and understanding of Target Heart Rate Range (THRR)  Yes       Intervention  Provide education and explanation of THRR including how the numbers were predicted and where they are located for reference       Expected Outcomes  Short Term: Able to state/look up THRR;Long Term: Able to use THRR to govern intensity when exercising independently;Short Term: Able to use daily as guideline for intensity in rehab       Able to check pulse independently  Yes       Intervention  Provide education and demonstration on how to check pulse in carotid and radial arteries.;Review the importance of being able to check your own pulse for safety during independent exercise        Expected Outcomes  Short Term: Able to explain why pulse checking is important during independent exercise;Long Term: Able to check pulse independently and accurately       Understanding of Exercise Prescription  Yes       Intervention  Provide education, explanation, and written materials on patient's individual exercise prescription       Expected Outcomes  Short Term: Able to explain program exercise prescription;Long Term: Able to explain home exercise prescription to exercise independently          Exercise Goals Re-Evaluation : Exercise Goals Re-Evaluation    Row Name 12/28/17 1052 01/04/18 1018 01/06/18 1030         Exercise Goal Re-Evaluation   Exercise Goals Review  Increase Physical Activity;Able to understand and use rate of perceived exertion (RPE) scale  Increase Physical Activity;Able to understand and use rate of perceived exertion (RPE) scale;Knowledge and understanding of Target Heart Rate Range (THRR);Understanding of Exercise Prescription  Increase Physical Activity;Able to understand and use rate of perceived exertion (RPE) scale;Knowledge and understanding of Target Heart Rate Range (THRR);Understanding of Exercise Prescription     Comments  Patient able to understand and use RPE scale appropriately.  Reviewed home exercise guidelines including THRR, RPE scale, and endpoints for exercise. [facility] and will try to walk 1 day/week at home.  [place]      Expected Outcomes  Increase workloads as tolerated to help improve cardiorespiratory fitness.  Patient will exercise 30 minutes, 1-2 days/week in addition to exercise at cardiac rehab.  Progress workloads to increase cardiorespiratoy fitness.         Discharge Exercise Prescription (Final Exercise Prescription Changes): Exercise Prescription Changes - 01/04/18 0952      Response to Exercise   Blood Pressure (Admit)  120/70  Blood Pressure (Exercise)   128/62    Blood Pressure (Exit)  108/78    Heart Rate (Admit)  75 bpm    Heart Rate (Exercise)  89 bpm    Heart Rate (Exit)  75 bpm    Rating of Perceived Exertion (Exercise)  13    Symptoms  none    Duration  Progress to 30 minutes of  aerobic without signs/symptoms of physical distress    Intensity  THRR unchanged      Progression   Progression  Continue to progress workloads to maintain intensity without signs/symptoms of physical distress.    Average METs  2.5      Resistance Training   Training Prescription  Yes    Weight  3lbs    Reps  10-15    Time  10 Minutes      Interval Training   Interval Training  No      NuStep   Level  5    SPM  85    Minutes  10    METs  1.9      Arm Ergometer   Level  2    Minutes  10      Track   Laps  12    Minutes  10    METs  2.91      Home Exercise Plan   Plans to continue exercise at  [facility] (comment)    Frequency  Add 1 additional day to program exercise sessions.    Initial Home Exercises Provided  01/04/18       Nutrition:  Target Goals: Understanding of nutrition guidelines, daily intake of sodium 1500mg , cholesterol 200mg , calories 30% from fat and 7% or less from saturated fats, daily to have 5 or more servings of fruits and vegetables.  Biometrics: Pre Biometrics - 12/22/17 0841      Pre Biometrics   Height  5\' 8"  (1.727 m)    Weight  68.1 kg    Waist Circumference  35.5 inches    Hip Circumference  37 inches    Waist to Hip Ratio  0.96 %    BMI (Calculated)  22.83    Triceps Skinfold  9 mm    % Body Fat  22.2 %    Grip Strength  30.5 kg    Flexibility  10 in    Single Leg Stand  10.97 seconds        Nutrition Therapy Plan and Nutrition Goals:   Nutrition Assessments:   Nutrition Goals Re-Evaluation:   Nutrition Goals Re-Evaluation:   Nutrition Goals Discharge (Final Nutrition Goals Re-Evaluation):   Psychosocial: Target Goals: Acknowledge presence or absence of significant  depression and/or stress, maximize coping skills, provide positive support system. Participant is able to verbalize types and ability to use techniques and skills needed for reducing stress and depression.  Initial Review & Psychosocial Screening: Initial Psych Review & Screening - 12/22/17 0912      Initial Review   Current issues with  None Identified      Family Dynamics   Good Support System?  Yes   wife, daughters      Barriers   Psychosocial barriers to participate in program  There are no identifiable barriers or psychosocial needs.      Screening Interventions   Interventions  Encouraged to exercise       Quality of Life Scores: Quality of Life - 12/22/17 0912      Quality of Life   Select  Quality of Life      Quality of Life Scores   Health/Function Pre  26.1 %    Socioeconomic Pre  29.2 %    Psych/Spiritual Pre  29.1 %    Family Pre  30 %    GLOBAL Pre  27.9 %      Scores of 19 and below usually indicate a poorer quality of life in these areas.  A difference of  2-3 points is a clinically meaningful difference.  A difference of 2-3 points in the total score of the Quality of Life Index has been associated with significant improvement in overall quality of life, self-image, physical symptoms, and general health in studies assessing change in quality of life.  PHQ-9: Recent Review Flowsheet Data    Depression screen [facility] 2/9 12/28/2017 11/03/2017 11/03/2017 04/21/2017 10/28/2016   Decreased Interest 0 0 0 1 0   Down, Depressed, Hopeless 0 0 0 1 0   PHQ - 2 Score 0 0 0 2 0   Altered sleeping - - - 2 -   Tired, decreased energy - - - 1 -   Change in appetite - - - 1 -   Feeling bad or failure about yourself  - - - 0 -   Trouble concentrating - - - 1 -   Moving slowly or fidgety/restless - - - 0 -   Suicidal thoughts - - - 0 -   PHQ-9 Score - - - 7 -   Difficult doing work/chores - - - Somewhat difficult -     Interpretation of Total Score  Total Score Depression  Severity:  1-4 = Minimal depression, 5-9 = Mild depression, 10-14 = Moderate depression, 15-19 = Moderately severe depression, 20-27 = Severe depression   Psychosocial Evaluation and Intervention:   Psychosocial Re-Evaluation: Psychosocial Re-Evaluation    Row Name 01/14/18 1135             Psychosocial Re-Evaluation   Current issues with  None Identified       Interventions  Encouraged to attend Cardiac Rehabilitation for the exercise       Continue Psychosocial Services   No Follow up required          Psychosocial Discharge (Final Psychosocial Re-Evaluation): Psychosocial Re-Evaluation - 01/14/18 1135      Psychosocial Re-Evaluation   Current issues with  None Identified    Interventions  Encouraged to attend Cardiac Rehabilitation for the exercise    Continue Psychosocial Services   No Follow up required       Vocational Rehabilitation: Provide vocational rehab assistance to qualifying candidates.   Vocational Rehab Evaluation & Intervention: Vocational Rehab - 12/22/17 0913      Initial Vocational Rehab Evaluation & Intervention   Assessment shows need for Vocational Rehabilitation  No   retired Armed forces logistics/support/administrative officer       Education: Education Goals: Education classes will be provided on a weekly basis, covering required topics. Participant will state understanding/return demonstration of topics presented.  Learning Barriers/Preferences: Learning Barriers/Preferences - 12/22/17 1154      Learning Barriers/Preferences   Learning Barriers  Hearing;Sight   Bilateral hearing aides, reading glasses   Learning Preferences  Skilled Demonstration       Education Topics: Count Your Pulse:  -Group instruction provided by verbal instruction, demonstration, patient participation and written materials to support subject.  Instructors address importance of being able to find your pulse and how to count your pulse when at home without a heart monitor.  Patients get hands on  experience counting their pulse with staff help and individually.   CARDIAC REHAB PHASE II EXERCISE from 01/08/2018 in [place]  Date  01/08/18  Instruction Review Code  2- Demonstrated Understanding      Heart Attack, Angina, and Risk Factor Modification:  -Group instruction provided by verbal instruction, video, and written materials to support subject.  Instructors address signs and symptoms of angina and heart attacks.    Also discuss risk factors for heart disease and how to make changes to improve heart health risk factors.   Functional Fitness:  -Group instruction provided by verbal instruction, demonstration, patient participation, and written materials to support subject.  Instructors address safety measures for doing things around the house.  Discuss how to get up and down off the floor, how to pick things up properly, how to safely get out of a chair without assistance, and balance training.   Meditation and Mindfulness:  -Group instruction provided by verbal instruction, patient participation, and written materials to support subject.  Instructor addresses importance of mindfulness and meditation practice to help reduce stress and improve awareness.  Instructor also leads participants through a meditation exercise.    Stretching for Flexibility and Mobility:  -Group instruction provided by verbal instruction, patient participation, and written materials to support subject.  Instructors lead participants through series of stretches that are designed to increase flexibility thus improving mobility.  These stretches are additional exercise for major muscle groups that are typically performed during regular warm up and cool down.   Hands Only CPR:  -Group verbal, video, and participation provides a basic overview of AHA guidelines for community CPR. Role-play of emergencies allow participants the opportunity to practice calling for help and chest  compression technique with discussion of AED use.   Hypertension: -Group verbal and written instruction that provides a basic overview of hypertension including the most recent diagnostic guidelines, risk factor reduction with self-care instructions and medication management.    Nutrition I class: Heart Healthy Eating:  -Group instruction provided by PowerPoint slides, verbal discussion, and written materials to support subject matter. The instructor gives an explanation and review of the Therapeutic Lifestyle Changes diet recommendations, which includes a discussion on lipid goals, dietary fat, sodium, fiber, plant stanol/sterol esters, sugar, and the components of a well-balanced, healthy diet.   Nutrition II class: Lifestyle Skills:  -Group instruction provided by PowerPoint slides, verbal discussion, and written materials to support subject matter. The instructor gives an explanation and review of label reading, grocery shopping for heart health, heart healthy recipe modifications, and ways to make healthier choices when eating out.   Diabetes Question & Answer:  -Group instruction provided by PowerPoint slides, verbal discussion, and written materials to support subject matter. The instructor gives an explanation and review of diabetes co-morbidities, pre- and post-prandial blood glucose goals, pre-exercise blood glucose goals, signs, symptoms, and treatment of hypoglycemia and hyperglycemia, and foot care basics.   Diabetes Blitz:  -Group instruction provided by PowerPoint slides, verbal discussion, and written materials to support subject matter. The instructor gives an explanation and review of the physiology behind type 1 and type 2 diabetes, diabetes medications and rational behind using different medications, pre- and post-prandial blood glucose recommendations and Hemoglobin A1c goals, diabetes diet, and exercise including blood glucose guidelines for exercising safely.    Portion  Distortion:  -Group instruction provided by PowerPoint slides, verbal discussion, written materials, and food models to support subject matter. The instructor gives  an explanation of serving size versus portion size, changes in portions sizes over the last 20 years, and what consists of a serving from each food group.   Stress Management:  -Group instruction provided by verbal instruction, video, and written materials to support subject matter.  Instructors review role of stress in heart disease and how to cope with stress positively.     Exercising on Your Own:  -Group instruction provided by verbal instruction, power point, and written materials to support subject.  Instructors discuss benefits of exercise, components of exercise, frequency and intensity of exercise, and end points for exercise.  Also discuss use of nitroglycerin and activating EMS.  Review options of places to exercise outside of rehab.  Review guidelines for sex with heart disease.   Cardiac Drugs I:  -Group instruction provided by verbal instruction and written materials to support subject.  Instructor reviews cardiac drug classes: antiplatelets, anticoagulants, beta blockers, and statins.  Instructor discusses reasons, side effects, and lifestyle considerations for each drug class.   Cardiac Drugs II:  -Group instruction provided by verbal instruction and written materials to support subject.  Instructor reviews cardiac drug classes: angiotensin converting enzyme inhibitors (ACE-I), angiotensin II receptor blockers (ARBs), nitrates, and calcium channel blockers.  Instructor discusses reasons, side effects, and lifestyle considerations for each drug class.   Anatomy and Physiology of the Circulatory System:  Group verbal and written instruction and models provide basic cardiac anatomy and physiology, with the coronary electrical and arterial systems. Review of: AMI, Angina, Valve disease, Heart Failure, Peripheral Artery  Disease, Cardiac Arrhythmia, Pacemakers, and the ICD.   Other Education:  -Group or individual verbal, written, or video instructions that support the educational goals of the cardiac rehab program.   Holiday Eating Survival Tips:  -Group instruction provided by PowerPoint slides, verbal discussion, and written materials to support subject matter. The instructor gives patients tips, tricks, and techniques to help them not only survive but enjoy the holidays despite the onslaught of food that accompanies the holidays.   Knowledge Questionnaire Score: Knowledge Questionnaire Score - 12/22/17 0914      Knowledge Questionnaire Score   Pre Score  13/24       Core Components/Risk Factors/Patient Goals at Admission: Personal Goals and Risk Factors at Admission - 12/22/17 1159      Core Components/Risk Factors/Patient Goals on Admission    Weight Management  Weight Gain;Yes    Intervention  Weight Management: Develop a combined nutrition and exercise program designed to reach desired caloric intake, while maintaining appropriate intake of nutrient and fiber, sodium and fats, and appropriate energy expenditure required for the weight goal.;Weight Management: Provide education and appropriate resources to help participant work on and attain dietary goals.    Admit Weight  150 lb 2.1 oz (68.1 kg)    Expected Outcomes  Weight Gain: Understanding of general recommendations for a high calorie, high protein meal plan that promotes weight gain by distributing calorie intake throughout the day with the consumption for 4-5 meals, snacks, and/or supplements;Short Term: Continue to assess and modify interventions until short term weight is achieved;Long Term: Adherence to nutrition and physical activity/exercise program aimed toward attainment of established weight goal    Heart Failure  Yes    Intervention  Provide a combined exercise and nutrition program that is supplemented with education, support and  counseling about heart failure. Directed toward relieving symptoms such as shortness of breath, decreased exercise tolerance, and extremity edema.    Expected Outcomes  Improve  functional capacity of life;Short term: Attendance in program 2-3 days a week with increased exercise capacity. Reported lower sodium intake. Reported increased fruit and vegetable intake. Reports medication compliance.;Short term: Daily weights obtained and reported for increase. Utilizing diuretic protocols set by physician.;Long term: Adoption of self-care skills and reduction of barriers for early signs and symptoms recognition and intervention leading to self-care maintenance.    Lipids  Yes    Intervention  Provide education and support for participant on nutrition & aerobic/resistive exercise along with prescribed medications to achieve LDL 70mg , HDL >40mg .    Expected Outcomes  Short Term: Participant states understanding of desired cholesterol values and is compliant with medications prescribed. Participant is following exercise prescription and nutrition guidelines.;Long Term: Cholesterol controlled with medications as prescribed, with individualized exercise RX and with personalized nutrition plan. Value goals: LDL < 70mg , HDL > 40 mg.    Personal Goal Other  Yes    Personal Goal  Participant would like to be able to walk with less SOB.    Intervention  Provide aerobic exercise program to help improve exercise tolerance, energy, and stamina and decrease breathlessness with walking.    Expected Outcomes  Patient will increase cardiorespiratory fitness through regular exercise routine and be less SOB with walking.       Core Components/Risk Factors/Patient Goals Review:  Goals and Risk Factor Review    Row Name 01/14/18 1136             Core Components/Risk Factors/Patient Goals Review   Personal Goals Review  Weight Management/Obesity;Lipids;Heart Failure;Hypertension       Review  Laverna Peace has maintianed his  current weight. Jimm's vital signs have been stable at phase 2 cardiac rehab.Laverna Peace is doing well with exercise       Expected Outcomes  Laverna Peace will continue to participate in phase 2 cardiac rehab, follow nutrition and lifestyle modfication opportunties.          Core Components/Risk Factors/Patient Goals at Discharge (Final Review):  Goals and Risk Factor Review - 01/14/18 1136      Core Components/Risk Factors/Patient Goals Review   Personal Goals Review  Weight Management/Obesity;Lipids;Heart Failure;Hypertension    Review  Laverna Peace has maintianed his current weight. Jimm's vital signs have been stable at phase 2 cardiac rehab.Laverna Peace is doing well with exercise    Expected Outcomes  Laverna Peace will continue to participate in phase 2 cardiac rehab, follow nutrition and lifestyle modfication opportunties.       ITP Comments: ITP Comments    Row Name 12/22/17 0856 01/14/18 1134         ITP Comments  Dr. Fransico Him, Medical Director   30 Day ITP Review. Laverna Peace is with good partcipation and attendance in phase 2 cardiac rehab.         Comments: See ITP comments.Barnet Pall, RN,BSN 01/14/2018 11:42 AM

## 2018-01-15 ENCOUNTER — Encounter (HOSPITAL_COMMUNITY): Payer: PPO

## 2018-01-15 ENCOUNTER — Encounter (HOSPITAL_COMMUNITY)
Admission: RE | Admit: 2018-01-15 | Discharge: 2018-01-15 | Disposition: A | Discharge status: Home / Self Care | Payer: PPO | Source: Ambulatory Visit | Attending: Cardiovascular Disease | Admitting: Cardiovascular Disease

## 2018-01-15 DIAGNOSIS — I213 ST elevation (STEMI) myocardial infarction of unspecified site: Secondary | ICD-10-CM

## 2018-01-15 DIAGNOSIS — Z955 Presence of coronary angioplasty implant and graft: Secondary | ICD-10-CM | POA: Diagnosis not present

## 2018-01-18 ENCOUNTER — Encounter (HOSPITAL_COMMUNITY): Payer: PPO

## 2018-01-18 ENCOUNTER — Encounter (HOSPITAL_COMMUNITY)
Admission: RE | Admit: 2018-01-18 | Discharge: 2018-01-18 | Disposition: A | Discharge status: Home / Self Care | Payer: PPO | Source: Ambulatory Visit | Attending: Cardiovascular Disease | Admitting: Cardiovascular Disease

## 2018-01-18 DIAGNOSIS — Z955 Presence of coronary angioplasty implant and graft: Secondary | ICD-10-CM

## 2018-01-18 DIAGNOSIS — I213 ST elevation (STEMI) myocardial infarction of unspecified site: Secondary | ICD-10-CM

## 2018-01-18 NOTE — Progress Notes (Signed)
Eugene [name] 84 y.o. male DOB: 18-May-1931 MRN: [id number]      Nutrition Note  No diagnosis found. Past Medical History:  Diagnosis Date  . Amputated right leg ([place])   . CAD (coronary artery disease)    a. 09/2017 - acute MI -  emergent cath showing 99% stenosis in midLAD with thrombotic stenosis at the bifurcation of the second diagonal, which was relatively small in diameter. He underwent placement of DES to midLAD which jailed the second diagonal.   . Chronic combined systolic and diastolic heart failure ([place])   . Collagenous colitis    Dr. Festus Holts  . History of ST elevation myocardial infarction (STEMI)    09/2017: LAD  . History of stomach ulcers   . Hyperlipidemia LDL goal <70   . Ischemic cardiomyopathy    Meds reviewed.    Current Outpatient Medications (Cardiovascular):  .  atorvastatin (LIPITOR) 80 MG tablet, Take 1 tablet (80 mg total) by mouth daily at 6 PM. .  carvedilol (COREG) 3.125 MG tablet, Take 1 tablet (3.125 mg total) by mouth 2 (two) times daily with a meal. .  cholestyramine (QUESTRAN) 4 GM/DOSE powder, USE 1 SCOOP 3 TIMES A DAY WITH A MEAL .  losartan (COZAAR) 25 MG tablet, Take 0.5 tablets (12.5 mg total) by mouth daily. .  nitroGLYCERIN (NITROSTAT) 0.4 MG SL tablet, Place 1 tablet (0.4 mg total) under the tongue every 5 (five) minutes x 3 doses as needed for chest pain.   Current Outpatient Medications (Analgesics):  .  aspirin 81 MG tablet, Take 81 mg by mouth at bedtime.  Current Outpatient Medications (Hematological):  .  ticagrelor (BRILINTA) 90 MG TABS tablet, Take 1 tablet (90 mg total) by mouth 2 (two) times daily.  Current Outpatient Medications (Other):  .  multivitamin-lutein (OCUVITE-LUTEIN) CAPS capsule, Take 1 capsule by mouth daily.   HT: Ht Readings from Last 1 Encounters:  12/22/17 5\' 8"  (1.727 m)    WT: Wt Readings from Last 5 Encounters:  12/22/17 150 lb 2.1 oz (68.1 kg)  11/23/17 155 lb (70.3 kg)  11/03/17 150 lb (68 kg)   11/02/17 152 lb (68.9 kg)  10/10/17 148 lb 4.8 oz (67.3 kg)     There is no height or weight on file to calculate BMI.   Current tobacco use? No  Labs:  Lipid Panel     Component Value Date/Time   CHOL 112 11/09/2017 1440   TRIG 83 11/09/2017 1440   HDL 52 11/09/2017 1440   CHOLHDL 2.2 11/09/2017 1440   CHOLHDL 2.4 11/03/2017 1004   VLDL 23 10/07/2017 1628   LDLCALC 43 11/09/2017 1440   LDLCALC 51 11/03/2017 1004    No results found for: HGBA1C CBG (last 3)  No results for input(s): GLUCAP in the last 72 hours.  Nutrition Note Spoke with pt. Nutrition plan and goals reviewed with pt. Pt is following heart healthy diet. Pt wants to learn more about heart healthy eating. Heart healthy eating tips reviewed (label reading, how to build a healthy plate, portion sizes, Heart healthy oils, heart healthy cooking methods, eating frequently across the day). Per discussion, pt does not use canned/convenience foods often. Pt does not add salt to food. Pt does not eat out frequently. Pt shared he does like to eat french fries often during the week. Discussed making them at home and baking them in the oven. Reviewed more heart healthy cooking methods and heart healthy oils with patient. Pt expressed understanding of the information  reviewed. Pt aware of nutrition education classes offered and would like to attend nutrition classes.  Nutrition Diagnosis ? Food-and nutrition-related knowledge deficit related to lack of exposure to information as related to diagnosis of: ? CVD  Nutrition Intervention ? Pt's individual nutrition plan and goals reviewed with pt.  Nutrition Goal(s):  ? Pt to identify and limit food sources of saturated fat, trans fat, refined carbohydrates and sodium ? Pt to decrease consumption of fried foods   Plan:  ? Pt to attend nutrition classes ? Nutrition I ? Nutrition II ? Portion Distortion  ? Will provide client-centered nutrition education as part of  interdisciplinary care ? Monitor and evaluate progress toward nutrition goal with team.  Laurina Bustle, MS, RD, LDN 01/18/2018 10:20 AM

## 2018-01-20 ENCOUNTER — Encounter (HOSPITAL_COMMUNITY)
Admission: RE | Admit: 2018-01-20 | Discharge: 2018-01-20 | Disposition: A | Discharge status: Home / Self Care | Payer: PPO | Source: Ambulatory Visit | Attending: Cardiovascular Disease | Admitting: Cardiovascular Disease

## 2018-01-20 ENCOUNTER — Encounter (HOSPITAL_COMMUNITY): Payer: PPO

## 2018-01-20 DIAGNOSIS — I213 ST elevation (STEMI) myocardial infarction of unspecified site: Secondary | ICD-10-CM

## 2018-01-20 DIAGNOSIS — Z955 Presence of coronary angioplasty implant and graft: Secondary | ICD-10-CM

## 2018-01-21 ENCOUNTER — Ambulatory Visit: Payer: PPO | Admitting: Physician Assistant

## 2018-01-22 ENCOUNTER — Encounter (HOSPITAL_COMMUNITY)
Admission: RE | Admit: 2018-01-22 | Discharge: 2018-01-22 | Disposition: A | Discharge status: Home / Self Care | Payer: PPO | Source: Ambulatory Visit | Attending: Cardiovascular Disease | Admitting: Cardiovascular Disease

## 2018-01-22 ENCOUNTER — Encounter (HOSPITAL_COMMUNITY): Payer: PPO

## 2018-01-22 DIAGNOSIS — I213 ST elevation (STEMI) myocardial infarction of unspecified site: Secondary | ICD-10-CM | POA: Diagnosis not present

## 2018-01-22 DIAGNOSIS — Z955 Presence of coronary angioplasty implant and graft: Secondary | ICD-10-CM

## 2018-01-22 DIAGNOSIS — H903 Sensorineural hearing loss, bilateral: Secondary | ICD-10-CM | POA: Diagnosis not present

## 2018-01-25 ENCOUNTER — Encounter (HOSPITAL_COMMUNITY): Payer: PPO

## 2018-01-25 ENCOUNTER — Encounter (HOSPITAL_COMMUNITY)
Admission: RE | Admit: 2018-01-25 | Discharge: 2018-01-25 | Disposition: A | Discharge status: Home / Self Care | Payer: PPO | Source: Ambulatory Visit | Attending: Cardiovascular Disease | Admitting: Cardiovascular Disease

## 2018-01-25 DIAGNOSIS — Z955 Presence of coronary angioplasty implant and graft: Secondary | ICD-10-CM

## 2018-01-25 DIAGNOSIS — I213 ST elevation (STEMI) myocardial infarction of unspecified site: Secondary | ICD-10-CM | POA: Diagnosis not present

## 2018-01-27 ENCOUNTER — Encounter (HOSPITAL_COMMUNITY)
Admission: RE | Admit: 2018-01-27 | Discharge: 2018-01-27 | Disposition: A | Discharge status: Home / Self Care | Payer: PPO | Source: Ambulatory Visit | Attending: Cardiovascular Disease | Admitting: Cardiovascular Disease

## 2018-01-27 ENCOUNTER — Encounter (HOSPITAL_COMMUNITY): Payer: PPO

## 2018-01-27 DIAGNOSIS — I213 ST elevation (STEMI) myocardial infarction of unspecified site: Secondary | ICD-10-CM | POA: Diagnosis not present

## 2018-01-27 DIAGNOSIS — Z955 Presence of coronary angioplasty implant and graft: Secondary | ICD-10-CM

## 2018-01-29 ENCOUNTER — Encounter (HOSPITAL_COMMUNITY): Payer: PPO

## 2018-01-29 ENCOUNTER — Encounter (HOSPITAL_COMMUNITY)
Admission: RE | Admit: 2018-01-29 | Discharge: 2018-01-29 | Disposition: A | Discharge status: Home / Self Care | Payer: PPO | Source: Ambulatory Visit | Attending: Cardiovascular Disease | Admitting: Cardiovascular Disease

## 2018-01-29 DIAGNOSIS — I213 ST elevation (STEMI) myocardial infarction of unspecified site: Secondary | ICD-10-CM | POA: Diagnosis not present

## 2018-01-29 DIAGNOSIS — Z955 Presence of coronary angioplasty implant and graft: Secondary | ICD-10-CM

## 2018-02-01 ENCOUNTER — Encounter (HOSPITAL_COMMUNITY)
Admission: RE | Admit: 2018-02-01 | Discharge: 2018-02-01 | Disposition: A | Discharge status: Home / Self Care | Payer: PPO | Source: Ambulatory Visit | Attending: Cardiovascular Disease | Admitting: Cardiovascular Disease

## 2018-02-01 ENCOUNTER — Encounter (HOSPITAL_COMMUNITY): Payer: PPO

## 2018-02-01 DIAGNOSIS — I213 ST elevation (STEMI) myocardial infarction of unspecified site: Secondary | ICD-10-CM

## 2018-02-01 DIAGNOSIS — Z955 Presence of coronary angioplasty implant and graft: Secondary | ICD-10-CM

## 2018-02-03 ENCOUNTER — Encounter (HOSPITAL_COMMUNITY): Payer: PPO

## 2018-02-03 ENCOUNTER — Encounter (HOSPITAL_COMMUNITY)
Admission: RE | Admit: 2018-02-03 | Discharge: 2018-02-03 | Disposition: A | Discharge status: Home / Self Care | Payer: PPO | Source: Ambulatory Visit | Attending: Cardiovascular Disease | Admitting: Cardiovascular Disease

## 2018-02-03 DIAGNOSIS — Z955 Presence of coronary angioplasty implant and graft: Secondary | ICD-10-CM

## 2018-02-03 DIAGNOSIS — I213 ST elevation (STEMI) myocardial infarction of unspecified site: Secondary | ICD-10-CM

## 2018-02-03 NOTE — Progress Notes (Signed)
Cardiac Individual Treatment Plan  Patient Details  Name: Eugene [name] MRN: [id number] Date of Birth: 1931/05/03 Referring Provider:     [place] from 12/22/2017 in [place]  Referring Provider  Lauree Chandler, MD      Initial Encounter Date:    CARDIAC REHAB PHASE II ORIENTATION from 12/22/2017 in [place]  Date  12/22/17      Visit Diagnosis: 10/07/2017 Stented coronary artery, S/P DES LAD  10/07/2017 ST elevation myocardial infarction (STEMI), unspecified artery ([place])  Patient's Home Medications on Admission:  Current Outpatient Medications:  .  aspirin 81 MG tablet, Take 81 mg by mouth at bedtime., Disp: , Rfl:  .  atorvastatin (LIPITOR) 80 MG tablet, Take 1 tablet (80 mg total) by mouth daily at 6 PM., Disp: 90 tablet, Rfl: 3 .  carvedilol (COREG) 3.125 MG tablet, Take 1 tablet (3.125 mg total) by mouth 2 (two) times daily with a meal., Disp: 180 tablet, Rfl: 3 .  cholestyramine (QUESTRAN) 4 GM/DOSE powder, USE 1 SCOOP 3 TIMES A DAY WITH A MEAL, Disp: 378 g, Rfl: 12 .  losartan (COZAAR) 25 MG tablet, Take 0.5 tablets (12.5 mg total) by mouth daily., Disp: 45 tablet, Rfl: 3 .  multivitamin-lutein (OCUVITE-LUTEIN) CAPS capsule, Take 1 capsule by mouth daily., Disp: , Rfl:  .  nitroGLYCERIN (NITROSTAT) 0.4 MG SL tablet, Place 1 tablet (0.4 mg total) under the tongue every 5 (five) minutes x 3 doses as needed for chest pain., Disp: 25 tablet, Rfl: 3 .  ticagrelor (BRILINTA) 90 MG TABS tablet, Take 1 tablet (90 mg total) by mouth 2 (two) times daily., Disp: 180 tablet, Rfl: 3  Past Medical History: Past Medical History:  Diagnosis Date  . Amputated right leg ([place])   . CAD (coronary artery disease)    a. 09/2017 - acute MI -  emergent cath showing 99% stenosis in midLAD with thrombotic stenosis at the bifurcation of the second diagonal, which was relatively small in diameter. He  underwent placement of DES to midLAD which jailed the second diagonal.   . Chronic combined systolic and diastolic heart failure ([place])   . Collagenous colitis    Dr. Festus Holts  . History of ST elevation myocardial infarction (STEMI)    09/2017: LAD  . History of stomach ulcers   . Hyperlipidemia LDL goal <70   . Ischemic cardiomyopathy     Tobacco Use: Social History   Tobacco Use  Smoking Status Former Smoker  . Packs/day: 1.00  . Years: 8.00  . Pack years: 8.00  . Types: Cigarettes  Smokeless Tobacco Never Used    Labs: Recent Review Flowsheet Data    Labs for ITP Cardiac and Pulmonary Rehab Latest Ref Rng & Units 10/23/2015 10/21/2016 10/07/2017 11/03/2017 11/09/2017   Cholestrol 100 - 199 mg/dL 167 166 188 110 112   LDLCALC 0 - 99 mg/dL 81 85 99 51 43   HDL >39 mg/dL 70 68 66 46 52   Trlycerides 0 - 149 mg/dL 79 63 113 57 83   TCO2 22 - 32 mmol/L - - 18(L) - -      Capillary Blood Glucose: No results found for: GLUCAP   Exercise Target Goals: Exercise Program Goal: Individual exercise prescription set using results from initial 6 min walk test and THRR while considering  patient's activity barriers and safety.   Exercise Prescription Goal: Initial exercise prescription builds to 30-45 minutes a day of  aerobic activity, 2-3 days per week.  Home exercise guidelines will be given to patient during program as part of exercise prescription that the participant will acknowledge.  Activity Barriers & Risk Stratification: Activity Barriers & Cardiac Risk Stratification - 12/22/17 0946      Activity Barriers & Cardiac Risk Stratification   Activity Barriers  Assistive Device;Other (comment)    Comments  Rt BKA- wears prosthesis. Prior back surgery.    Cardiac Risk Stratification  High       6 Minute Walk: 6 Minute Walk    Row Name 12/22/17 0856         6 Minute Walk   Phase  Initial     Distance  1333 feet     Walk Time  6 minutes     # of Rest Breaks  0     MPH   2.52     METS  2.4     RPE  12     Perceived Dyspnea   0     VO2 Peak  8.39     Symptoms  No     Resting HR  67 bpm     Resting BP  118/68     Resting Oxygen Saturation   98 %     Exercise Oxygen Saturation  during 6 min walk  99 %     Max Ex. HR  97 bpm     Max Ex. BP  122/68     2 Minute Post BP  108/78        Oxygen Initial Assessment:   Oxygen Re-Evaluation:   Oxygen Discharge (Final Oxygen Re-Evaluation):   Initial Exercise Prescription: Initial Exercise Prescription - 12/22/17 1000      Date of Initial Exercise RX and Referring Provider   Date  12/22/17    Referring Provider  Lauree Chandler, MD    Expected Discharge Date  03/29/18      NuStep   Level  2    SPM  85    Minutes  10    METs  2.5      Arm Ergometer   Level  1    Minutes  10    METs  2      Track   Laps  9    Minutes  10    METs  2.56      Prescription Details   Frequency (times per week)  3    Duration  Progress to 30 minutes of continuous aerobic without signs/symptoms of physical distress      Intensity   THRR 40-80% of Max Heartrate  54-109    Ratings of Perceived Exertion  11-13    Perceived Dyspnea  0-4      Progression   Progression  Continue to progress workloads to maintain intensity without signs/symptoms of physical distress.      Resistance Training   Training Prescription  Yes    Weight  3lbs    Reps  10-15       Perform Capillary Blood Glucose checks as needed.  Exercise Prescription Changes:  Exercise Prescription Changes    Row Name 12/28/17 0954 01/04/18 0952 01/25/18 0950 02/01/18 0954       Response to Exercise   Blood Pressure (Admit)  112/72  120/70  120/66  106/72    Blood Pressure (Exercise)  124/80  128/62  120/70  130/70    Blood Pressure (Exit)  108/72  108/78  102/60  122/70  Heart Rate (Admit)  73 bpm  75 bpm  71 bpm  86 bpm    Heart Rate (Exercise)  90 bpm  89 bpm  94 bpm  101 bpm    Heart Rate (Exit)  73 bpm  75 bpm  75 bpm  83  bpm    Rating of Perceived Exertion (Exercise)  12  13  13  13     Symptoms  none  none  none  none    Duration  Progress to 30 minutes of  aerobic without signs/symptoms of physical distress  Progress to 30 minutes of  aerobic without signs/symptoms of physical distress  Progress to 30 minutes of  aerobic without signs/symptoms of physical distress  Progress to 30 minutes of  aerobic without signs/symptoms of physical distress    Intensity  THRR unchanged  THRR unchanged  THRR unchanged  THRR unchanged      Progression   Progression  Continue to progress workloads to maintain intensity without signs/symptoms of physical distress.  Continue to progress workloads to maintain intensity without signs/symptoms of physical distress.  Continue to progress workloads to maintain intensity without signs/symptoms of physical distress.  Continue to progress workloads to maintain intensity without signs/symptoms of physical distress.    Average METs  2.4  2.5  2.5  2.4      Resistance Training   Training Prescription  Yes  Yes  Yes  Yes    Weight  3lbs  3lbs  3lbs  3lbs    Reps  10-15  10-15  10-15  10-15    Time  10 Minutes  10 Minutes  10 Minutes  10 Minutes      Interval Training   Interval Training  No  No  No  No      NuStep   Level  2  5  5  5     SPM  85  85  85  85    Minutes  10  10  10  10     METs  1.9  1.9  2.3  2.1      Arm Ergometer   Level  1  2  2   2.3    Minutes  10  10  10  10       Track   Laps  11  12  10  10     Minutes  10  10  10  10     METs  2.91  2.91  2.74  2.74      Home Exercise Plan   Plans to continue exercise at  -  [employer] (comment)  Forensic scientist (comment)  Forensic scientist (comment)    Frequency  -  Add 1 additional day to program exercise sessions.  Add 1 additional day to program exercise sessions.  Add 1 additional day to program exercise sessions.    Initial Home Exercises Provided  -  01/04/18  01/04/18  01/04/18       Exercise  Comments:  Exercise Comments    Row Name 12/28/17 1052 01/04/18 1018 01/06/18 1030 01/25/18 1017 02/01/18 1028   Exercise Comments  Patient tolerated first session of exercise well without symptoms.  Reviewed home exercise guidelines, METs, and goals with patient.  Reviewed METs and goals with patient.  Reviewed METs with patient.  Reviewed goals with patient.      Exercise Goals and Review:  Exercise Goals    Row Name 12/22/17 [phone]  Exercise Goals   Increase Physical Activity  Yes       Intervention  Provide advice, education, support and counseling about physical activity/exercise needs.;Develop an individualized exercise prescription for aerobic and resistive training based on initial evaluation findings, risk stratification, comorbidities and participant's personal goals.       Expected Outcomes  Short Term: Attend rehab on a regular basis to increase amount of physical activity.;Long Term: Exercising regularly at least 3-5 days a week.;Long Term: Add in home exercise to make exercise part of routine and to increase amount of physical activity.       Increase Strength and Stamina  Yes       Intervention  Provide advice, education, support and counseling about physical activity/exercise needs.;Develop an individualized exercise prescription for aerobic and resistive training based on initial evaluation findings, risk stratification, comorbidities and participant's personal goals.       Expected Outcomes  Short Term: Increase workloads from initial exercise prescription for resistance, speed, and METs.;Short Term: Perform resistance training exercises routinely during rehab and add in resistance training at home;Long Term: Improve cardiorespiratory fitness, muscular endurance and strength as measured by increased METs and functional capacity (6MWT)       Able to understand and use rate of perceived exertion (RPE) scale  Yes       Intervention  Provide education and explanation  on how to use RPE scale       Expected Outcomes  Short Term: Able to use RPE daily in rehab to express subjective intensity level;Long Term:  Able to use RPE to guide intensity level when exercising independently       Knowledge and understanding of Target Heart Rate Range (THRR)  Yes       Intervention  Provide education and explanation of THRR including how the numbers were predicted and where they are located for reference       Expected Outcomes  Short Term: Able to state/look up THRR;Long Term: Able to use THRR to govern intensity when exercising independently;Short Term: Able to use daily as guideline for intensity in rehab       Able to check pulse independently  Yes       Intervention  Provide education and demonstration on how to check pulse in carotid and radial arteries.;Review the importance of being able to check your own pulse for safety during independent exercise       Expected Outcomes  Short Term: Able to explain why pulse checking is important during independent exercise;Long Term: Able to check pulse independently and accurately       Understanding of Exercise Prescription  Yes       Intervention  Provide education, explanation, and written materials on patient's individual exercise prescription       Expected Outcomes  Short Term: Able to explain program exercise prescription;Long Term: Able to explain home exercise prescription to exercise independently          Exercise Goals Re-Evaluation : Exercise Goals Re-Evaluation    Row Name 12/28/17 1052 01/04/18 1018 01/06/18 1030 02/01/18 1028       Exercise Goal Re-Evaluation   Exercise Goals Review  Increase Physical Activity;Able to understand and use rate of perceived exertion (RPE) scale  Increase Physical Activity;Able to understand and use rate of perceived exertion (RPE) scale;Knowledge and understanding of Target Heart Rate Range (THRR);Understanding of Exercise Prescription  Increase Physical Activity;Able to understand  and use rate of perceived exertion (RPE) scale;Knowledge and understanding of Target Heart Rate  Range (THRR);Understanding of Exercise Prescription  Increase Physical Activity;Able to understand and use rate of perceived exertion (RPE) scale;Knowledge and understanding of Target Heart Rate Range (THRR);Understanding of Exercise Prescription    Comments  Patient able to understand and use RPE scale appropriately.  Reviewed home exercise guidelines including THRR, RPE scale, and endpoints for exercise. [facility] and will try to walk 1 day/week at home.  [place]   Patient is walking some at home is limited because leg amputation, wearing prosthesis.    Expected Outcomes  Increase workloads as tolerated to help improve cardiorespiratory fitness.  Patient will exercise 30 minutes, 1-2 days/week in addition to exercise at cardiac rehab.  Progress workloads to increase cardiorespiratoy fitness.   Continue to increase workloads at cardiac rehab as tolerated.       Discharge Exercise Prescription (Final Exercise Prescription Changes): Exercise Prescription Changes - 02/01/18 0954      Response to Exercise   Blood Pressure (Admit)  106/72    Blood Pressure (Exercise)  130/70    Blood Pressure (Exit)  122/70    Heart Rate (Admit)  86 bpm    Heart Rate (Exercise)  101 bpm    Heart Rate (Exit)  83 bpm    Rating of Perceived Exertion (Exercise)  13    Symptoms  none    Duration  Progress to 30 minutes of  aerobic without signs/symptoms of physical distress    Intensity  THRR unchanged      Progression   Progression  Continue to progress workloads to maintain intensity without signs/symptoms of physical distress.    Average METs  2.4      Resistance Training   Training Prescription  Yes    Weight  3lbs    Reps  10-15    Time  10 Minutes      Interval Training   Interval Training  No      NuStep   Level  5    SPM  85    Minutes   10    METs  2.1      Arm Ergometer   Level  2.3    Minutes  10      Track   Laps  10    Minutes  10    METs  2.74      Home Exercise Plan   Plans to continue exercise at  [facility] (comment)    Frequency  Add 1 additional day to program exercise sessions.    Initial Home Exercises Provided  01/04/18       Nutrition:  Target Goals: Understanding of nutrition guidelines, daily intake of sodium 1500mg , cholesterol 200mg , calories 30% from fat and 7% or less from saturated fats, daily to have 5 or more servings of fruits and vegetables.  Biometrics: Pre Biometrics - 12/22/17 0841      Pre Biometrics   Height  5\' 8"  (1.727 m)    Weight  68.1 kg    Waist Circumference  35.5 inches    Hip Circumference  37 inches    Waist to Hip Ratio  0.96 %    BMI (Calculated)  22.83    Triceps Skinfold  9 mm    % Body Fat  22.2 %    Grip Strength  30.5 kg    Flexibility  10 in    Single Leg Stand  10.97 seconds  Nutrition Therapy Plan and Nutrition Goals: Nutrition Therapy & Goals - 01/18/18 1020      Nutrition Therapy   Diet  general healthful      Personal Nutrition Goals   Nutrition Goal  pt to identify and limit food sources of sodium, saturated fat, trans fats, and refined carbohydrates    Personal Goal #2  pt to decrease fried food consumption      Intervention Plan   Intervention  Prescribe, educate and counsel regarding individualized specific dietary modifications aiming towards targeted core components such as weight, hypertension, lipid management, diabetes, heart failure and other comorbidities.    Expected Outcomes  Short Term Goal: Understand basic principles of dietary content, such as calories, fat, sodium, cholesterol and nutrients.;Long Term Goal: Adherence to prescribed nutrition plan.       Nutrition Assessments: Nutrition Assessments - 01/18/18 1026      MEDFICTS Scores   Pre Score  40       Nutrition Goals Re-Evaluation: Nutrition  Goals Re-Evaluation    Row Name 01/18/18 1020             Goals   Current Weight  150 lb 9.2 oz (68.3 kg)          Nutrition Goals Re-Evaluation: Nutrition Goals Re-Evaluation    [place] Name 01/18/18 1020             Goals   Current Weight  150 lb 9.2 oz (68.3 kg)          Nutrition Goals Discharge (Final Nutrition Goals Re-Evaluation): Nutrition Goals Re-Evaluation - 01/18/18 1020      Goals   Current Weight  150 lb 9.2 oz (68.3 kg)       Psychosocial: Target Goals: Acknowledge presence or absence of significant depression and/or stress, maximize coping skills, provide positive support system. Participant is able to verbalize types and ability to use techniques and skills needed for reducing stress and depression.  Initial Review & Psychosocial Screening: Initial Psych Review & Screening - 12/22/17 0912      Initial Review   Current issues with  None Identified      Family Dynamics   Good Support System?  Yes   wife, daughters      Barriers   Psychosocial barriers to participate in program  There are no identifiable barriers or psychosocial needs.      Screening Interventions   Interventions  Encouraged to exercise       Quality of Life Scores: Quality of Life - 12/22/17 0912      Quality of Life   Select  Quality of Life      Quality of Life Scores   Health/Function Pre  26.1 %    Socioeconomic Pre  29.2 %    Psych/Spiritual Pre  29.1 %    Family Pre  30 %    GLOBAL Pre  27.9 %      Scores of 19 and below usually indicate a poorer quality of life in these areas.  A difference of  2-3 points is a clinically meaningful difference.  A difference of 2-3 points in the total score of the Quality of Life Index has been associated with significant improvement in overall quality of life, self-image, physical symptoms, and general health in studies assessing change in quality of life.  PHQ-9: Recent Review Flowsheet Data    Depression screen [facility] 2/9  12/28/2017 11/03/2017 11/03/2017 04/21/2017 10/28/2016   Decreased Interest 0 0 0 1 0  Down, Depressed, Hopeless 0 0 0 1 0   PHQ - 2 Score 0 0 0 2 0   Altered sleeping - - - 2 -   Tired, decreased energy - - - 1 -   Change in appetite - - - 1 -   Feeling bad or failure about yourself  - - - 0 -   Trouble concentrating - - - 1 -   Moving slowly or fidgety/restless - - - 0 -   Suicidal thoughts - - - 0 -   PHQ-9 Score - - - 7 -   Difficult doing work/chores - - - Somewhat difficult -     Interpretation of Total Score  Total Score Depression Severity:  1-4 = Minimal depression, 5-9 = Mild depression, 10-14 = Moderate depression, 15-19 = Moderately severe depression, 20-27 = Severe depression   Psychosocial Evaluation and Intervention:   Psychosocial Re-Evaluation: Psychosocial Re-Evaluation    Row Name 01/14/18 1135 02/03/18 1356           Psychosocial Re-Evaluation   Current issues with  None Identified  None Identified      Interventions  Encouraged to attend Cardiac Rehabilitation for the exercise  Encouraged to attend Cardiac Rehabilitation for the exercise      Continue Psychosocial Services   No Follow up required  No Follow up required         Psychosocial Discharge (Final Psychosocial Re-Evaluation): Psychosocial Re-Evaluation - 02/03/18 1356      Psychosocial Re-Evaluation   Current issues with  None Identified    Interventions  Encouraged to attend Cardiac Rehabilitation for the exercise    Continue Psychosocial Services   No Follow up required       Vocational Rehabilitation: Provide vocational rehab assistance to qualifying candidates.   Vocational Rehab Evaluation & Intervention: Vocational Rehab - 12/22/17 0913      Initial Vocational Rehab Evaluation & Intervention   Assessment shows need for Vocational Rehabilitation  No   retired Armed forces logistics/support/administrative officer       Education: Education Goals: Education classes will be provided on a weekly basis, covering required  topics. Participant will state understanding/return demonstration of topics presented.  Learning Barriers/Preferences: Learning Barriers/Preferences - 12/22/17 1154      Learning Barriers/Preferences   Learning Barriers  Hearing;Sight   Bilateral hearing aides, reading glasses   Learning Preferences  Skilled Demonstration       Education Topics: Count Your Pulse:  -Group instruction provided by verbal instruction, demonstration, patient participation and written materials to support subject.  Instructors address importance of being able to find your pulse and how to count your pulse when at home without a heart monitor.  Patients get hands on experience counting their pulse with staff help and individually.   CARDIAC REHAB PHASE II EXERCISE from 01/27/2018 in [place]  Date  01/08/18  Instruction Review Code  2- Demonstrated Understanding      Heart Attack, Angina, and Risk Factor Modification:  -Group instruction provided by verbal instruction, video, and written materials to support subject.  Instructors address signs and symptoms of angina and heart attacks.    Also discuss risk factors for heart disease and how to make changes to improve heart health risk factors.   Functional Fitness:  -Group instruction provided by verbal instruction, demonstration, patient participation, and written materials to support subject.  Instructors address safety measures for doing things around the house.  Discuss how to get up and down off  the floor, how to pick things up properly, how to safely get out of a chair without assistance, and balance training.   Meditation and Mindfulness:  -Group instruction provided by verbal instruction, patient participation, and written materials to support subject.  Instructor addresses importance of mindfulness and meditation practice to help reduce stress and improve awareness.  Instructor also leads participants through a  meditation exercise.    Stretching for Flexibility and Mobility:  -Group instruction provided by verbal instruction, patient participation, and written materials to support subject.  Instructors lead participants through series of stretches that are designed to increase flexibility thus improving mobility.  These stretches are additional exercise for major muscle groups that are typically performed during regular warm up and cool down.   Hands Only CPR:  -Group verbal, video, and participation provides a basic overview of AHA guidelines for community CPR. Role-play of emergencies allow participants the opportunity to practice calling for help and chest compression technique with discussion of AED use.   Hypertension: -Group verbal and written instruction that provides a basic overview of hypertension including the most recent diagnostic guidelines, risk factor reduction with self-care instructions and medication management.    Nutrition I class: Heart Healthy Eating:  -Group instruction provided by PowerPoint slides, verbal discussion, and written materials to support subject matter. The instructor gives an explanation and review of the Therapeutic Lifestyle Changes diet recommendations, which includes a discussion on lipid goals, dietary fat, sodium, fiber, plant stanol/sterol esters, sugar, and the components of a well-balanced, healthy diet.   Nutrition II class: Lifestyle Skills:  -Group instruction provided by PowerPoint slides, verbal discussion, and written materials to support subject matter. The instructor gives an explanation and review of label reading, grocery shopping for heart health, heart healthy recipe modifications, and ways to make healthier choices when eating out.   Diabetes Question & Answer:  -Group instruction provided by PowerPoint slides, verbal discussion, and written materials to support subject matter. The instructor gives an explanation and review of diabetes  co-morbidities, pre- and post-prandial blood glucose goals, pre-exercise blood glucose goals, signs, symptoms, and treatment of hypoglycemia and hyperglycemia, and foot care basics.   Diabetes Blitz:  -Group instruction provided by PowerPoint slides, verbal discussion, and written materials to support subject matter. The instructor gives an explanation and review of the physiology behind type 1 and type 2 diabetes, diabetes medications and rational behind using different medications, pre- and post-prandial blood glucose recommendations and Hemoglobin A1c goals, diabetes diet, and exercise including blood glucose guidelines for exercising safely.    Portion Distortion:  -Group instruction provided by PowerPoint slides, verbal discussion, written materials, and food models to support subject matter. The instructor gives an explanation of serving size versus portion size, changes in portions sizes over the last 20 years, and what consists of a serving from each food group.   Stress Management:  -Group instruction provided by verbal instruction, video, and written materials to support subject matter.  Instructors review role of stress in heart disease and how to cope with stress positively.     Exercising on Your Own:  -Group instruction provided by verbal instruction, power point, and written materials to support subject.  Instructors discuss benefits of exercise, components of exercise, frequency and intensity of exercise, and end points for exercise.  Also discuss use of nitroglycerin and activating EMS.  Review options of places to exercise outside of rehab.  Review guidelines for sex with heart disease.   Cardiac Drugs I:  -Group instruction provided  by verbal instruction and written materials to support subject.  Instructor reviews cardiac drug classes: antiplatelets, anticoagulants, beta blockers, and statins.  Instructor discusses reasons, side effects, and lifestyle considerations for each  drug class.   CARDIAC REHAB PHASE II EXERCISE from 01/27/2018 in [place]  Date  01/27/18  Educator  Pharmacist  Instruction Review Code  2- Demonstrated Understanding      Cardiac Drugs II:  -Group instruction provided by verbal instruction and written materials to support subject.  Instructor reviews cardiac drug classes: angiotensin converting enzyme inhibitors (ACE-I), angiotensin II receptor blockers (ARBs), nitrates, and calcium channel blockers.  Instructor discusses reasons, side effects, and lifestyle considerations for each drug class.   Anatomy and Physiology of the Circulatory System:  Group verbal and written instruction and models provide basic cardiac anatomy and physiology, with the coronary electrical and arterial systems. Review of: AMI, Angina, Valve disease, Heart Failure, Peripheral Artery Disease, Cardiac Arrhythmia, Pacemakers, and the ICD.   Other Education:  -Group or individual verbal, written, or video instructions that support the educational goals of the cardiac rehab program.   Holiday Eating Survival Tips:  -Group instruction provided by PowerPoint slides, verbal discussion, and written materials to support subject matter. The instructor gives patients tips, tricks, and techniques to help them not only survive but enjoy the holidays despite the onslaught of food that accompanies the holidays.   Knowledge Questionnaire Score: Knowledge Questionnaire Score - 12/22/17 0914      Knowledge Questionnaire Score   Pre Score  13/24       Core Components/Risk Factors/Patient Goals at Admission: Personal Goals and Risk Factors at Admission - 12/22/17 1159      Core Components/Risk Factors/Patient Goals on Admission    Weight Management  Weight Gain;Yes    Intervention  Weight Management: Develop a combined nutrition and exercise program designed to reach desired caloric intake, while maintaining appropriate intake of nutrient  and fiber, sodium and fats, and appropriate energy expenditure required for the weight goal.;Weight Management: Provide education and appropriate resources to help participant work on and attain dietary goals.    Admit Weight  150 lb 2.1 oz (68.1 kg)    Expected Outcomes  Weight Gain: Understanding of general recommendations for a high calorie, high protein meal plan that promotes weight gain by distributing calorie intake throughout the day with the consumption for 4-5 meals, snacks, and/or supplements;Short Term: Continue to assess and modify interventions until short term weight is achieved;Long Term: Adherence to nutrition and physical activity/exercise program aimed toward attainment of established weight goal    Heart Failure  Yes    Intervention  Provide a combined exercise and nutrition program that is supplemented with education, support and counseling about heart failure. Directed toward relieving symptoms such as shortness of breath, decreased exercise tolerance, and extremity edema.    Expected Outcomes  Improve functional capacity of life;Short term: Attendance in program 2-3 days a week with increased exercise capacity. Reported lower sodium intake. Reported increased fruit and vegetable intake. Reports medication compliance.;Short term: Daily weights obtained and reported for increase. Utilizing diuretic protocols set by physician.;Long term: Adoption of self-care skills and reduction of barriers for early signs and symptoms recognition and intervention leading to self-care maintenance.    Lipids  Yes    Intervention  Provide education and support for participant on nutrition & aerobic/resistive exercise along with prescribed medications to achieve LDL 70mg , HDL >40mg .    Expected Outcomes  Short Term: Participant  states understanding of desired cholesterol values and is compliant with medications prescribed. Participant is following exercise prescription and nutrition guidelines.;Long Term:  Cholesterol controlled with medications as prescribed, with individualized exercise RX and with personalized nutrition plan. Value goals: LDL < 70mg , HDL > 40 mg.    Personal Goal Other  Yes    Personal Goal  Participant would like to be able to walk with less SOB.    Intervention  Provide aerobic exercise program to help improve exercise tolerance, energy, and stamina and decrease breathlessness with walking.    Expected Outcomes  Patient will increase cardiorespiratory fitness through regular exercise routine and be less SOB with walking.       Core Components/Risk Factors/Patient Goals Review:  Goals and Risk Factor Review    Row Name 01/14/18 1136 02/03/18 1356           Core Components/Risk Factors/Patient Goals Review   Personal Goals Review  Weight Management/Obesity;Lipids;Heart Failure;Hypertension  Weight Management/Obesity;Lipids;Heart Failure;Hypertension      Review  Eugene [name] has maintianed his current weight. Eugene [name]'s vital signs have been stable at phase 2 cardiac rehab.Eugene [name] is doing well with exercise  Eugene [name] has maintianed his current weight. Eugene [name]'s vital signs have been stable at phase 2 cardiac rehab.Eugene [name] is doing well with exercise      Expected Outcomes  Eugene [name] will continue to participate in phase 2 cardiac rehab, follow nutrition and lifestyle modfication opportunties.  Eugene [name] will continue to participate in phase 2 cardiac rehab, follow nutrition and lifestyle modfication opportunties.         Core Components/Risk Factors/Patient Goals at Discharge (Final Review):  Goals and Risk Factor Review - 02/03/18 1356      Core Components/Risk Factors/Patient Goals Review   Personal Goals Review  Weight Management/Obesity;Lipids;Heart Failure;Hypertension    Review  Eugene [name] has maintianed his current weight. Eugene [name]'s vital signs have been stable at phase 2 cardiac rehab.Eugene [name] is doing well with exercise    Expected Outcomes  Eugene [name] will continue to participate in phase 2 cardiac  rehab, follow nutrition and lifestyle modfication opportunties.       ITP Comments: ITP Comments    Row Name 12/22/17 0856 01/14/18 1134 02/03/18 1403       ITP Comments  Dr. Fransico Him, Medical Director   30 Day ITP Review. Eugene [name] is with good partcipation and attendance in phase 2 cardiac rehab.  30 Day ITP Review. Eugene [name] is with good partcipation and attendance in phase 2 cardiac rehab.        Comments: See ITP comments.Barnet Pall, RN,BSN 02/03/2018 2:03 PM

## 2018-02-04 ENCOUNTER — Encounter: Payer: Self-pay | Admitting: Cardiovascular Disease

## 2018-02-04 ENCOUNTER — Ambulatory Visit: Payer: PPO | Admitting: Cardiovascular Disease

## 2018-02-04 VITALS — BP 112/60 | HR 68 | Ht 68.0 in | Wt 149.4 lb

## 2018-02-04 DIAGNOSIS — I2510 Atherosclerotic heart disease of native coronary artery without angina pectoris: Secondary | ICD-10-CM

## 2018-02-04 DIAGNOSIS — E7800 Pure hypercholesterolemia, unspecified: Secondary | ICD-10-CM

## 2018-02-04 DIAGNOSIS — I255 Ischemic cardiomyopathy: Secondary | ICD-10-CM | POA: Diagnosis not present

## 2018-02-04 NOTE — Patient Instructions (Signed)
Medication Instructions:  Your physician recommends that you continue on your current medications as directed. Please refer to the Current Medication list given to you today.  If you need a refill on your cardiac medications before your next appointment, please call your pharmacy.   Lab work: none If you have labs (blood work) drawn today and your tests are completely normal, you will receive your results only by: Marland Kitchen MyChart Message (if you have MyChart) OR . A paper copy in the mail If you have any lab test that is abnormal or we need to change your treatment, we will call you to review the results.  Testing/Procedures: Your physician has requested that you have an echocardiogram. Echocardiography is a painless test that uses sound waves to create images of your heart. It provides your doctor with information about the size and shape of your heart and how well your heart's chambers and valves are working. This procedure takes approximately one hour. There are no restrictions for this procedure.    Follow-Up: At [facility], you and your health needs are our priority.  As part of our continuing mission to provide you with exceptional heart care, we have created designated Provider Care Teams.  These Care Teams include your primary Cardiologist (physician) and Advanced Practice Providers (APPs -  Physician Assistants and Nurse Practitioners) who all work together to provide you with the care you need, when you need it. You will need a follow up appointment in June/July  Please call our office 2 months in advance to schedule this appointment.  You may see Lauree Chandler, MD or one of the following Advanced Practice Providers on your designated Care Team:   [place], PA-C Melina Copa, PA-C . Ermalinda Barrios, PA-C  Any Other Special Instructions Will Be Listed Below (If Applicable).

## 2018-02-04 NOTE — Progress Notes (Signed)
Chief Complaint  Patient presents with  . 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 He was admitted to [facility] in July 2019 with an anterior ST elevation MI. Cardiac cath showed a 99% mid LAD stenosis. A drug eluting stent was placed in the mid LAD. Echo 10/08/17 with LVEF=35-40% with akinesis of the anterior and apical walls. He was not started on an Ace-inh or ARB due to hypotension. He was seen in our office 11/02/17 by Melina Copa, PA and was started on Losartan. He was doing well at that time. He has since started cardiac rehab.   He is here today for follow up. The patient denies any chest pain, dyspnea, palpitations, lower extremity edema, orthopnea, PND, dizziness, near syncope or syncope. He has been in cardiac rehab and doing well.   Primary Care Physician: Susy Frizzle, MD  Past Medical History:  Diagnosis Date  . Amputated right leg ([place])   . CAD (coronary artery disease)    a. 09/2017 - acute MI -  emergent cath showing 99% stenosis in midLAD with thrombotic stenosis at the bifurcation of the second diagonal, which was relatively small in diameter. He underwent placement of DES to midLAD which jailed the second diagonal.   . Chronic combined systolic and diastolic heart failure ([place])   . Collagenous colitis    Dr. Festus Holts  . History of ST elevation myocardial infarction (STEMI)    09/2017: LAD  . History of stomach ulcers   . Hyperlipidemia LDL goal <70   . Ischemic cardiomyopathy     Past Surgical History:  Procedure Laterality Date  . BACK SURGERY    . CARDIAC CATHETERIZATION    . CORONARY STENT INTERVENTION N/A 10/07/2017   Procedure: CORONARY STENT INTERVENTION;  Surgeon: Wellington Hampshire, MD;  Location: [place] [facility];  Service: Cardiovascular;  Laterality:  N/A;  . CORONARY/GRAFT ACUTE MI REVASCULARIZATION N/A 10/07/2017   Procedure: Coronary/Graft Acute MI Revascularization;  Surgeon: Wellington Hampshire, MD;  Location: [place] [facility];  Service: Cardiovascular;  Laterality: N/A;  . LEFT HEART CATH AND CORONARY ANGIOGRAPHY N/A 10/07/2017   Procedure: LEFT HEART CATH AND CORONARY ANGIOGRAPHY;  Surgeon: Wellington Hampshire, MD;  Location: [place] [facility];  Service: Cardiovascular;  Laterality: N/A;  . LEG AMPUTATION     Right Below knee  . STOMACH SURGERY     Billroth II gastrojejunostomy  . TOTAL KNEE ARTHROPLASTY      Current Outpatient Medications  Medication Sig Dispense Refill  . aspirin 81 MG tablet Take 81 mg by mouth at bedtime.    Marland Kitchen atorvastatin (LIPITOR) 80 MG tablet Take 1 tablet (80 mg total) by mouth daily at 6 PM. 90 tablet 3  . carvedilol (COREG) 3.125 MG tablet Take 1 tablet (3.125 mg total) by mouth 2 (two) times daily with a meal. 180 tablet 3  . cholestyramine (QUESTRAN) 4 GM/DOSE powder USE 1 SCOOP 3 TIMES A DAY WITH A MEAL 378 g 12  . losartan (COZAAR) 25 MG tablet Take 0.5 tablets (12.5 mg total) by mouth daily. 45 tablet 3  . multivitamin-lutein (OCUVITE-LUTEIN) CAPS capsule Take 1 capsule by mouth daily.    . nitroGLYCERIN (NITROSTAT) 0.4 MG SL tablet Place 1 tablet (0.4 mg total) under the tongue every 5 (five) minutes  x 3 doses as needed for chest pain. 25 tablet 3  . ticagrelor (BRILINTA) 90 MG TABS tablet Take 1 tablet (90 mg total) by mouth 2 (two) times daily. 180 tablet 3   No current facility-administered medications for this visit.     Allergies  Allergen Reactions  . Aleve [Naproxen Sodium] Nausea And Vomiting    No problem with other NSAIDs  . Morphine And Related     dizzy  . Penicillins     Diarrhea     Social History   Socioeconomic History  . Marital status: Married    Spouse name: Financial risk analyst  . Number of children: Not on file  . Years of education: 74  . Highest education level: High school  graduate  Occupational History  . Occupation: Retired  Scientific laboratory technician  . Financial resource strain: Not on file  . Food insecurity:    Worry: Never true    Inability: Never true  . Transportation needs:    Medical: No    Non-medical: No  Tobacco Use  . Smoking status: Former Smoker    Packs/day: 1.00    Years: 8.00    Pack years: 8.00    Types: Cigarettes  . Smokeless tobacco: Never Used  Substance and Sexual Activity  . Alcohol use: Yes    Comment: occasional  . Drug use: No  . Sexual activity: Yes    Comment: married  Lifestyle  . Physical activity:    Days per week: 0 days    Minutes per session: 0 min  . Stress: Only a little  Relationships  . Social connections:    Talks on phone: Not on file    Gets together: Not on file    Attends religious service: Not on file    Active member of club or organization: Not on file    Attends meetings of clubs or organizations: Not on file    Relationship status: Not on file  . Intimate partner violence:    Fear of current or ex partner: Not on file    Emotionally abused: Not on file    Physically abused: Not on file    Forced sexual activity: Not on file  Other Topics Concern  . Not on file  Social History Narrative  . Not on file    Family History  Problem Relation Age of Onset  . Diabetes Sister   . Diabetes Brother   . Diabetes Sister   . Cancer Sister        breast    Review of Systems:  As stated in the HPI and otherwise negative.   BP 112/60   Pulse 68   Ht 5\' 8"  (1.727 m)   Wt 149 lb 6.4 oz (67.8 kg)   SpO2 98%   BMI 22.72 kg/m   Physical Examination: General: Well developed, well nourished, NAD  HEENT: OP clear, mucus membranes moist  SKIN: warm, dry. No rashes. Neuro: No focal deficits  Musculoskeletal: Muscle strength 5/5 all ext  Psychiatric: Mood and affect normal  Neck: No JVD, no carotid bruits, no thyromegaly, no lymphadenopathy.  Lungs:Clear bilaterally, no wheezes, rhonci,  crackles Cardiovascular: Regular rate and rhythm. No murmurs, gallops or rubs. Abdomen:Soft. Bowel sounds present. Non-tender.  Extremities: No lower extremity edema. Pulses are 2 + in the bilateral DP/PT.  EKG:  EKG is not ordered today. The ekg ordered today demonstrates   Recent Labs: 10/10/2017: Hemoglobin 13.4; Platelets 192 11/09/2017: ALT 25; BUN 13; Creatinine, Ser 0.87; Potassium  3.8; Sodium 139   Lipid Panel    Component Value Date/Time   CHOL 112 11/09/2017 1440   TRIG 83 11/09/2017 1440   HDL 52 11/09/2017 1440   CHOLHDL 2.2 11/09/2017 1440   CHOLHDL 2.4 11/03/2017 1004   VLDL 23 10/07/2017 1628   LDLCALC 43 11/09/2017 1440   LDLCALC 51 11/03/2017 1004     Wt Readings from Last 3 Encounters:  02/04/18 149 lb 6.4 oz (67.8 kg)  12/22/17 150 lb 2.1 oz (68.1 kg)  11/23/17 155 lb (70.3 kg)     Other studies Reviewed: Additional studies/ records that were reviewed today include:  Review of the above records demonstrates:    Assessment and Plan:   1. CAD without angina: Admitted with anterior STEMI July 2019. A drug eluting stent was placed in the LAD. He is having no chest pain. Will continue DAPT with ASA and Brilinta for at least one year. Continue statin, beta blocker and ARB.   2. Ischemic cardiomyopathy: LVEF=35-40% post MI. Will repeat echo now to assess LVEF. Continue beta blocker and ARB.   3. Hyperlipidemia: LDL at goal. Continue statin  Current medicines are reviewed at length with the patient today.  The patient does not have concerns regarding medicines.  The following changes have been made:  no change  Labs/ tests ordered today include:   Orders Placed This Encounter  Procedures  . ECHOCARDIOGRAM COMPLETE     Disposition:   FU with me in 6 months.    Signed, Lauree Chandler, MD 02/04/2018 10:56 AM    [address], [place], [place]  [address] Phone: [phone]; Fax: [phone]

## 2018-02-05 ENCOUNTER — Encounter (HOSPITAL_COMMUNITY): Payer: PPO

## 2018-02-08 ENCOUNTER — Encounter (HOSPITAL_COMMUNITY): Payer: PPO

## 2018-02-09 ENCOUNTER — Encounter: Payer: Self-pay | Admitting: Family Medicine

## 2018-02-09 ENCOUNTER — Ambulatory Visit (INDEPENDENT_AMBULATORY_CARE_PROVIDER_SITE_OTHER): Payer: PPO | Admitting: Family Medicine

## 2018-02-09 VITALS — BP 120/64 | HR 76 | Temp 97.6°F | Resp 16 | Wt 150.5 lb

## 2018-02-09 DIAGNOSIS — J310 Chronic rhinitis: Secondary | ICD-10-CM | POA: Diagnosis not present

## 2018-02-09 MED ORDER — CETIRIZINE HCL 10 MG PO TBDP: 10.0000 mg | ORAL_TABLET | Freq: Every day | ORAL | 0 refills | Status: DC

## 2018-02-09 MED ORDER — DOXYCYCLINE HYCLATE 100 MG PO TABS: 100.0000 mg | ORAL_TABLET | Freq: Two times a day (BID) | ORAL | 0 refills | Status: AC

## 2018-02-09 MED ORDER — FLUTICASONE PROPIONATE 50 MCG/ACT NA SUSP: 2.0000 | Freq: Every day | NASAL | 2 refills | Status: DC

## 2018-02-09 NOTE — Progress Notes (Signed)
Patient ID: Eugene [name], male    DOB: [birth year], 84 y.o.   MRN: [id number]  PCP: Susy Frizzle, MD  Chief Complaint  Patient presents with  . URI    Patient in today with c/o head congestion, productive cough, runny nose. Onset 1 week.     Subjective:   Eugene [name] is a 84 y.o. male, presents to clinic with CC of URI sx for 5 days that started to improve 2 days ago.  He has nasal congestion and discharge, postnasal drip and cough - worse at night and first thing in the morning, he only has productive cough in the morning.  Sx started to feel better over the last two days, he has been using a humidifier and that helps.  He and his wife were concerned about getting his lungs checked since her had an MI in June 2019 and started cardiac rehab a month ago.  He denies any CP, SOB, chest tightness, wheeze, fever, hot/cold chills, sweats.  Denies any exertional sx.  He says his main bothersome sx is the nasal congestion and drainage.  He denies HA.  Nasal discharge is clear to yellow.  No facial pain or eye pain.     Patient Active Problem List   Diagnosis Date Noted  . CAD (coronary artery disease) 10/10/2017  . Ischemic cardiomyopathy   . Chronic combined systolic and diastolic heart failure ([place])   . Hyperlipidemia LDL goal <70   . Acute ST elevation myocardial infarction (STEMI) involving left anterior descending (LAD) coronary artery ([place]) 10/07/2017  . Collagenous colitis   . History of right below knee amputation ([place]) 10/13/2013  . Sinus tachycardia 06/04/2012  . Anxiety state, unspecified 06/04/2012     Prior to Admission medications   Medication Sig Start Date End Date Taking? Authorizing Provider  aspirin 81 MG tablet Take 81 mg by mouth at bedtime.   Yes [provider]  atorvastatin (LIPITOR) 80 MG tablet Take 1 tablet (80 mg total) by mouth daily at 6 PM. 11/02/17  Yes Dunn, Dayna N, PA-C  carvedilol (COREG) 3.125 MG tablet Take 1 tablet (3.125 mg total) by  mouth 2 (two) times daily with a meal. 11/02/17  Yes Dunn, Dayna N, PA-C  cholestyramine (QUESTRAN) 4 GM/DOSE powder USE 1 SCOOP 3 TIMES A DAY WITH A MEAL 09/29/17  Yes Susy Frizzle, MD  losartan (COZAAR) 25 MG tablet Take 0.5 tablets (12.5 mg total) by mouth daily. 11/02/17  Yes Dunn, Dayna N, PA-C  multivitamin-lutein (OCUVITE-LUTEIN) CAPS capsule Take 1 capsule by mouth daily.   Yes [provider]  nitroGLYCERIN (NITROSTAT) 0.4 MG SL tablet Place 1 tablet (0.4 mg total) under the tongue every 5 (five) minutes x 3 doses as needed for chest pain. 10/10/17  Yes Duke, Tami Lin, PA  ticagrelor (BRILINTA) 90 MG TABS tablet Take 1 tablet (90 mg total) by mouth 2 (two) times daily. 11/02/17  Yes Dunn, Dayna N, PA-C  Cetirizine HCl (ZYRTEC ALLERGY) 10 MG TBDP Take 10 mg by mouth at bedtime. 02/09/18   Delsa Grana, PA-C  doxycycline (VIBRA-TABS) 100 MG tablet Take 1 tablet (100 mg total) by mouth 2 (two) times daily for 7 days. 02/09/18 02/16/18  Delsa Grana, PA-C  fluticasone (FLONASE) 50 MCG/ACT nasal spray Place 2 sprays into both nostrils daily. 02/09/18   Delsa Grana, PA-C     Allergies  Allergen Reactions  . Aleve [Naproxen Sodium] Nausea And Vomiting    No problem with  other NSAIDs  . Amoxicillin   . Morphine And Related     dizzy  . Penicillins     Diarrhea      Family History  Problem Relation Age of Onset  . Diabetes Sister   . Diabetes Brother   . Diabetes Sister   . Cancer Sister        breast     Social History   Socioeconomic History  . Marital status: Married    Spouse name: Financial risk analyst  . Number of children: Not on file  . Years of education: 3  . Highest education level: High school graduate  Occupational History  . Occupation: Retired  Scientific laboratory technician  . Financial resource strain: Not on file  . Food insecurity:    Worry: Never true    Inability: Never true  . Transportation needs:    Medical: No    Non-medical: No  Tobacco Use  . Smoking  status: Former Smoker    Packs/day: 1.00    Years: 8.00    Pack years: 8.00    Types: Cigarettes  . Smokeless tobacco: Never Used  Substance and Sexual Activity  . Alcohol use: Yes    Comment: occasional  . Drug use: No  . Sexual activity: Yes    Comment: married  Lifestyle  . Physical activity:    Days per week: 0 days    Minutes per session: 0 min  . Stress: Only a little  Relationships  . Social connections:    Talks on phone: Not on file    Gets together: Not on file    Attends religious service: Not on file    Active member of club or organization: Not on file    Attends meetings of clubs or organizations: Not on file    Relationship status: Not on file  . Intimate partner violence:    Fear of current or ex partner: Not on file    Emotionally abused: Not on file    Physically abused: Not on file    Forced sexual activity: Not on file  Other Topics Concern  . Not on file  Social History Narrative  . Not on file     Review of Systems  Constitutional: Negative.  Negative for activity change, appetite change, chills, diaphoresis, fatigue, fever and unexpected weight change.  HENT: Positive for voice change. Negative for sneezing, tinnitus and trouble swallowing.   Eyes: Negative.   Respiratory: Negative.  Negative for apnea, choking, chest tightness, shortness of breath, wheezing and stridor.   Cardiovascular: Negative.  Negative for chest pain, palpitations and leg swelling.  Gastrointestinal: Negative.  Negative for abdominal pain, constipation, diarrhea, nausea and vomiting.  Endocrine: Negative.   Genitourinary: Negative.   Musculoskeletal: Negative.   Skin: Negative.  Negative for color change, pallor and rash.  Allergic/Immunologic: Negative.   Neurological: Negative.   Hematological: Negative.   Psychiatric/Behavioral: Negative.   All other systems reviewed and are negative.      Objective:    Vitals:   02/09/18 1121  BP: 120/64  Pulse: 76  Resp:  16  Temp: 97.6 F (36.4 C)  TempSrc: Oral  SpO2: 96%  Weight: 150 lb 8 oz (68.3 kg)      Physical Exam  Constitutional: He is oriented to person, place, and time. He appears well-developed and well-nourished. He is cooperative.  Non-toxic appearance. He does not have a sickly appearance. No distress.  Elderly male, well appearing, NAD  HENT:  Head: Normocephalic and atraumatic.  Right Ear: Tympanic membrane, external ear and ear canal normal.  Left Ear: Tympanic membrane, external ear and ear canal normal.  Nose: Mucosal edema and rhinorrhea present. Right sinus exhibits no maxillary sinus tenderness and no frontal sinus tenderness. Left sinus exhibits no maxillary sinus tenderness and no frontal sinus tenderness.  Mouth/Throat: Uvula is midline and mucous membranes are normal. Mucous membranes are not pale, not dry and not cyanotic. No trismus in the jaw. No uvula swelling. Posterior oropharyngeal erythema (very mild OP injection) present. No oropharyngeal exudate, posterior oropharyngeal edema or tonsillar abscesses. No tonsillar exudate.  Profuse clear nasal discharge and congestion  Eyes: Pupils are equal, round, and reactive to light. Conjunctivae, EOM and lids are normal. Right eye exhibits no discharge. Left eye exhibits no discharge.  Neck: Trachea normal, normal range of motion and phonation normal. Neck supple. No tracheal deviation present.  Cardiovascular: Normal rate, regular rhythm and normal heart sounds. Exam reveals no gallop and no friction rub.  No murmur heard. Pulses:      Radial pulses are 2+ on the right side, and 2+ on the left side.  Pulmonary/Chest: Effort normal and breath sounds normal. No accessory muscle usage or stridor. No tachypnea. No respiratory distress. He has no decreased breath sounds. He has no wheezes. He has no rhonchi. He has no rales.  Abdominal: Soft. Normal appearance and bowel sounds are normal. He exhibits no distension. There is no  tenderness.  Musculoskeletal: Normal range of motion. He exhibits no edema.  Neurological: He is alert and oriented to person, place, and time. He exhibits normal muscle tone. Coordination and gait normal.  Skin: Skin is warm, dry and intact. Capillary refill takes less than 2 seconds. No rash noted. He is not diaphoretic. No cyanosis. No pallor. Nails show no clubbing.  Psychiatric: He has a normal mood and affect. His speech is normal and behavior is normal.  Nursing note and vitals reviewed.         Assessment & Plan:   84 year old male presents with URI symptoms x1 week, for the past 2 days he has been feeling much better with better sleep, decreased congestion and only mild cough first thing in the morning, no recent fever, chills, sweats, weight loss, shortness of breath, chest pain, fatigue over the past 2 days.  He is going to cardiac rehab, recently had MI, was concerned and his wife was concerned so he wanted his lungs checked today.  On exam his lungs are clear, his nasal mucosa is very edematous with copious discharge and the posterior oropharynx appears mildly irritated.  He is well-appearing, afebrile, vital signs stable, and he notes improvement over the past several days.  I feel he would likely continue to improve suspect it was a viral syndrome that is running its course, can support and minimize symptoms with adding antihistamine and steroid nasal spray, using coolmist humidifier and saline nasal spray.  Encourage patient to do a trial of antihistamines and steroid nasal spray for 2 weeks to see if his symptoms improve, and then try to discontinue.  He has any recurrence of his symptoms I suspect he has some allergies and will need to continue to do medications.  If the pt has any acute worsening over the holiday weekend - worsening sinus pain/pressure, fever, sweats, he was given doxycycline on a printed prescription to hold and fill to treat acute bacterial sinusitis with  symptoms noted above.  F/up as needed.  Plan reviewed with the patient and with  his wife and instructions were also printed and annotated with them and they verbalized understanding of plan.  he is in agreement with supportive measures because he is feeling better.  He is relieved that his lungs sound clear.  He was told he could resume cardiac rehab when feeling well as long as he is afebrile has no fatigue, malaise, sweats or worsening sinus symptoms or any worsening cough symptoms    ICD-10-CM   1. Rhinitis, unspecified type J31.0        Delsa Grana, PA-C 02/09/18 1:40 PM

## 2018-02-09 NOTE — Patient Instructions (Signed)
Start Zyrtec or other over the counter 24 hour antihistamine

## 2018-02-10 ENCOUNTER — Encounter (HOSPITAL_COMMUNITY): Payer: PPO

## 2018-02-10 ENCOUNTER — Encounter (HOSPITAL_COMMUNITY)
Admission: RE | Admit: 2018-02-10 | Discharge: 2018-02-10 | Disposition: A | Discharge status: Home / Self Care | Payer: PPO | Source: Ambulatory Visit | Attending: Cardiovascular Disease | Admitting: Cardiovascular Disease

## 2018-02-10 DIAGNOSIS — I213 ST elevation (STEMI) myocardial infarction of unspecified site: Secondary | ICD-10-CM

## 2018-02-10 DIAGNOSIS — Z955 Presence of coronary angioplasty implant and graft: Secondary | ICD-10-CM

## 2018-02-12 ENCOUNTER — Encounter (HOSPITAL_COMMUNITY): Payer: PPO

## 2018-02-15 ENCOUNTER — Encounter (HOSPITAL_COMMUNITY): Payer: PPO

## 2018-02-15 ENCOUNTER — Other Ambulatory Visit: Payer: Self-pay

## 2018-02-15 ENCOUNTER — Ambulatory Visit (HOSPITAL_COMMUNITY): Payer: PPO | Attending: Cardiovascular Disease

## 2018-02-15 ENCOUNTER — Encounter (HOSPITAL_COMMUNITY)
Admission: RE | Admit: 2018-02-15 | Discharge: 2018-02-15 | Disposition: A | Discharge status: Home / Self Care | Payer: PPO | Source: Ambulatory Visit | Attending: Cardiovascular Disease | Admitting: Cardiovascular Disease

## 2018-02-15 ENCOUNTER — Telehealth: Payer: Self-pay | Admitting: Family Medicine

## 2018-02-15 DIAGNOSIS — I2510 Atherosclerotic heart disease of native coronary artery without angina pectoris: Secondary | ICD-10-CM | POA: Insufficient documentation

## 2018-02-15 DIAGNOSIS — Z955 Presence of coronary angioplasty implant and graft: Secondary | ICD-10-CM | POA: Diagnosis not present

## 2018-02-15 DIAGNOSIS — I213 ST elevation (STEMI) myocardial infarction of unspecified site: Secondary | ICD-10-CM | POA: Insufficient documentation

## 2018-02-15 DIAGNOSIS — I255 Ischemic cardiomyopathy: Secondary | ICD-10-CM | POA: Insufficient documentation

## 2018-02-15 NOTE — Telephone Encounter (Signed)
Patient called in requesting an appointment. He states he was seen last week and is no better he was prescribed medication but he doesn't see where it is working. I offered another appointment with Kristeen Miss, however patient has declined would like to see Dr. Dennard Schaumann. Please advise.  CB# [phone]

## 2018-02-15 NOTE — Telephone Encounter (Signed)
Please give him a same day tomorrow with me

## 2018-02-16 ENCOUNTER — Encounter: Payer: Self-pay | Admitting: Family Medicine

## 2018-02-16 ENCOUNTER — Ambulatory Visit (INDEPENDENT_AMBULATORY_CARE_PROVIDER_SITE_OTHER): Payer: PPO | Admitting: Family Medicine

## 2018-02-16 VITALS — BP 144/80 | HR 68 | Temp 97.8°F | Resp 16 | Ht 70.0 in | Wt 147.0 lb

## 2018-02-16 DIAGNOSIS — J069 Acute upper respiratory infection, unspecified: Secondary | ICD-10-CM

## 2018-02-16 MED ORDER — HYDROCODONE-HOMATROPINE 5-1.5 MG/5ML PO SYRP: 5.0000 mL | ORAL_SOLUTION | Freq: Three times a day (TID) | ORAL | 0 refills | Status: DC | PRN

## 2018-02-16 NOTE — Telephone Encounter (Signed)
Patient is coming in at 2 today

## 2018-02-16 NOTE — Telephone Encounter (Signed)
LVM asking pt to return my call so I can give him appointment.

## 2018-02-16 NOTE — Progress Notes (Signed)
Subjective:    Patient ID: Eugene [name], male    DOB: Nov 27, 1931, 84 y.o.   MRN: [id number]  HPI Patient was seen last week and was diagnosed with viral upper respiratory infection.  He was started on allergy medication and Flonase for head congestion.  He presents today stating that he is no better.  Symptoms include rhinorrhea.  He denies any sinus pain.  He denies any sinus pressure.  He denies any fevers or chills.  At night he develops a cough when he lays down.  The cough keeps him awake.  He denies any chest pain.  He denies any shortness of breath.  He denies any purulent sputum.  He denies any pleurisy.  He denies any otalgia.  He denies any sore throat.  He states that he does not feel that bad he simply has clear rhinorrhea and a cough that is an aggravation but not worsening.  Is taking Delsym for his cough. Past Medical History:  Diagnosis Date  . Amputated right leg ([place])   . CAD (coronary artery disease)    a. 09/2017 - acute MI -  emergent cath showing 99% stenosis in midLAD with thrombotic stenosis at the bifurcation of the second diagonal, which was relatively small in diameter. He underwent placement of DES to midLAD which jailed the second diagonal.   . Chronic combined systolic and diastolic heart failure ([place])   . Collagenous colitis    Dr. Festus Holts  . History of ST elevation myocardial infarction (STEMI)    09/2017: LAD  . History of stomach ulcers   . Hyperlipidemia LDL goal <70   . Ischemic cardiomyopathy    Past Surgical History:  Procedure Laterality Date  . BACK SURGERY    . CARDIAC CATHETERIZATION    . CORONARY STENT INTERVENTION N/A 10/07/2017   Procedure: CORONARY STENT INTERVENTION;  Surgeon: Wellington Hampshire, MD;  Location: [facility];  Service: Cardiovascular;  Laterality: N/A;  . CORONARY/GRAFT ACUTE MI REVASCULARIZATION N/A 10/07/2017   Procedure: Coronary/Graft Acute MI Revascularization;  Surgeon: Wellington Hampshire, MD;  Location: [facility];  Service: Cardiovascular;  Laterality: N/A;  . LEFT HEART CATH AND CORONARY ANGIOGRAPHY N/A 10/07/2017   Procedure: LEFT HEART CATH AND CORONARY ANGIOGRAPHY;  Surgeon: Wellington Hampshire, MD;  Location: [facility];  Service: Cardiovascular;  Laterality: N/A;  . LEG AMPUTATION     Right Below knee  . STOMACH SURGERY     Billroth II gastrojejunostomy  . TOTAL KNEE ARTHROPLASTY     Current Outpatient Medications on File Prior to Visit  Medication Sig Dispense Refill  . aspirin 81 MG tablet Take 81 mg by mouth at bedtime.    Marland Kitchen atorvastatin (LIPITOR) 80 MG tablet Take 1 tablet (80 mg total) by mouth daily at 6 PM. 90 tablet 3  . carvedilol (COREG) 3.125 MG tablet Take 1 tablet (3.125 mg total) by mouth 2 (two) times daily with a meal. 180 tablet 3  . Cetirizine HCl (ZYRTEC ALLERGY) 10 MG TBDP Take 10 mg by mouth at bedtime. 30 tablet 0  . cholestyramine (QUESTRAN) 4 GM/DOSE powder USE 1 SCOOP 3 TIMES A DAY WITH A MEAL 378 g 12  . doxycycline (VIBRA-TABS) 100 MG tablet Take 1 tablet (100 mg total) by mouth 2 (two) times daily for 7 days. 14 tablet 0  . fluticasone (FLONASE) 50 MCG/ACT nasal spray Place 2 sprays into both nostrils daily. 16 g 2  . losartan (COZAAR) 25  MG tablet Take 0.5 tablets (12.5 mg total) by mouth daily. 45 tablet 3  . multivitamin-lutein (OCUVITE-LUTEIN) CAPS capsule Take 1 capsule by mouth daily.    . nitroGLYCERIN (NITROSTAT) 0.4 MG SL tablet Place 1 tablet (0.4 mg total) under the tongue every 5 (five) minutes x 3 doses as needed for chest pain. 25 tablet 3  . ticagrelor (BRILINTA) 90 MG TABS tablet Take 1 tablet (90 mg total) by mouth 2 (two) times daily. 180 tablet 3   No current facility-administered medications on file prior to visit.    Allergies  Allergen Reactions  . Aleve [Naproxen Sodium] Nausea And Vomiting    No problem with other NSAIDs  . Amoxicillin   . Morphine And Related     dizzy  . Penicillins     Diarrhea    Social History    Socioeconomic History  . Marital status: Married    Spouse name: Financial risk analyst  . Number of children: Not on file  . Years of education: 63  . Highest education level: High school graduate  Occupational History  . Occupation: Retired  Scientific laboratory technician  . Financial resource strain: Not on file  . Food insecurity:    Worry: Never true    Inability: Never true  . Transportation needs:    Medical: No    Non-medical: No  Tobacco Use  . Smoking status: Former Smoker    Packs/day: 1.00    Years: 8.00    Pack years: 8.00    Types: Cigarettes  . Smokeless tobacco: Never Used  Substance and Sexual Activity  . Alcohol use: Yes    Comment: occasional  . Drug use: No  . Sexual activity: Yes    Comment: married  Lifestyle  . Physical activity:    Days per week: 0 days    Minutes per session: 0 min  . Stress: Only a little  Relationships  . Social connections:    Talks on phone: Not on file    Gets together: Not on file    Attends religious service: Not on file    Active member of club or organization: Not on file    Attends meetings of clubs or organizations: Not on file    Relationship status: Not on file  . Intimate partner violence:    Fear of current or ex partner: Not on file    Emotionally abused: Not on file    Physically abused: Not on file    Forced sexual activity: Not on file  Other Topics Concern  . Not on file  Social History Narrative  . Not on file      Review of Systems  All other systems reviewed and are negative.      Objective:   Physical Exam  Constitutional: He appears well-developed and well-nourished. No distress.  HENT:  Right Ear: External ear normal.  Left Ear: External ear normal.  Nose: Mucosal edema and rhinorrhea present. Right sinus exhibits no maxillary sinus tenderness and no frontal sinus tenderness. Left sinus exhibits no maxillary sinus tenderness and no frontal sinus tenderness.  Mouth/Throat: Oropharynx is clear and moist. No  oropharyngeal exudate.  Eyes: Conjunctivae are normal. Right eye exhibits no discharge. Left eye exhibits no discharge.  Cardiovascular: Normal rate, regular rhythm and normal heart sounds.  Pulmonary/Chest: Effort normal and breath sounds normal. No stridor. No respiratory distress. He has no wheezes. He has no rales.  Skin: He is not diaphoretic.  Vitals reviewed.  Assessment & Plan:  Viral upper respiratory tract infection  Patient has a viral upper respiratory infection.  I explained that this simply takes time.  I see no evidence of a serious bacterial infection that would benefit from antibiotics.  Instead I recommend continuing his current course of therapy.  He can continue to use antihistamines and Flonase for rhinorrhea.  I believe that we can use something stronger at night to help calm his cough so that he can get more rest.  Therefore I recommended that he discontinue Delsym and replace it with Hycodan 1 teaspoon p.o. nightly as needed cough.  I warned the patient that the medication has a narcotic in it and that it can make him drowsy and that he should not medication and drive.  Recheck in 1 week if no better or sooner if worse

## 2018-02-17 ENCOUNTER — Encounter (HOSPITAL_COMMUNITY): Payer: PPO

## 2018-02-17 ENCOUNTER — Encounter (HOSPITAL_COMMUNITY)
Admission: RE | Admit: 2018-02-17 | Discharge: 2018-02-17 | Disposition: A | Discharge status: Home / Self Care | Payer: PPO | Source: Ambulatory Visit | Attending: Cardiovascular Disease | Admitting: Cardiovascular Disease

## 2018-02-17 DIAGNOSIS — I213 ST elevation (STEMI) myocardial infarction of unspecified site: Secondary | ICD-10-CM

## 2018-02-17 DIAGNOSIS — Z955 Presence of coronary angioplasty implant and graft: Secondary | ICD-10-CM

## 2018-02-19 ENCOUNTER — Encounter (HOSPITAL_COMMUNITY)
Admission: RE | Admit: 2018-02-19 | Discharge: 2018-02-19 | Disposition: A | Discharge status: Home / Self Care | Payer: PPO | Source: Ambulatory Visit | Attending: Cardiovascular Disease | Admitting: Cardiovascular Disease

## 2018-02-19 ENCOUNTER — Encounter (HOSPITAL_COMMUNITY): Payer: PPO

## 2018-02-19 DIAGNOSIS — Z955 Presence of coronary angioplasty implant and graft: Secondary | ICD-10-CM

## 2018-02-19 DIAGNOSIS — I213 ST elevation (STEMI) myocardial infarction of unspecified site: Secondary | ICD-10-CM | POA: Diagnosis not present

## 2018-02-22 ENCOUNTER — Encounter (HOSPITAL_COMMUNITY): Payer: PPO

## 2018-02-22 ENCOUNTER — Encounter (HOSPITAL_COMMUNITY)
Admission: RE | Admit: 2018-02-22 | Discharge: 2018-02-22 | Disposition: A | Discharge status: Home / Self Care | Payer: PPO | Source: Ambulatory Visit | Attending: Cardiovascular Disease | Admitting: Cardiovascular Disease

## 2018-02-22 DIAGNOSIS — I213 ST elevation (STEMI) myocardial infarction of unspecified site: Secondary | ICD-10-CM

## 2018-02-22 DIAGNOSIS — Z955 Presence of coronary angioplasty implant and graft: Secondary | ICD-10-CM

## 2018-02-24 ENCOUNTER — Other Ambulatory Visit: Payer: Self-pay | Admitting: Physician Assistant

## 2018-02-24 ENCOUNTER — Encounter (HOSPITAL_COMMUNITY)
Admission: RE | Admit: 2018-02-24 | Discharge: 2018-02-24 | Disposition: A | Discharge status: Home / Self Care | Payer: PPO | Source: Ambulatory Visit | Attending: Cardiovascular Disease | Admitting: Cardiovascular Disease

## 2018-02-24 ENCOUNTER — Encounter (HOSPITAL_COMMUNITY): Payer: PPO

## 2018-02-24 DIAGNOSIS — L570 Actinic keratosis: Secondary | ICD-10-CM | POA: Diagnosis not present

## 2018-02-24 DIAGNOSIS — I213 ST elevation (STEMI) myocardial infarction of unspecified site: Secondary | ICD-10-CM | POA: Diagnosis not present

## 2018-02-24 DIAGNOSIS — D485 Neoplasm of uncertain behavior of skin: Secondary | ICD-10-CM | POA: Diagnosis not present

## 2018-02-24 DIAGNOSIS — Z955 Presence of coronary angioplasty implant and graft: Secondary | ICD-10-CM

## 2018-02-24 DIAGNOSIS — D0439 Carcinoma in situ of skin of other parts of face: Secondary | ICD-10-CM | POA: Diagnosis not present

## 2018-02-24 NOTE — Progress Notes (Signed)
Cardiac Individual Treatment Plan  Patient Details  Name: XAIDEN FLEIG MRN: [id number] Date of Birth: 15-Sep-1931 Referring Provider:     [facility] from 12/22/2017 in [place]  Referring Provider  Lauree Chandler, MD      Initial Encounter Date:    CARDIAC REHAB PHASE II ORIENTATION from 12/22/2017 in [place]  Date  12/22/17      Visit Diagnosis: 10/07/2017 Stented coronary artery, S/P DES LAD  10/07/2017 ST elevation myocardial infarction (STEMI), unspecified artery ([place])  Patient's Home Medications on Admission:  Current Outpatient Medications:  .  aspirin 81 MG tablet, Take 81 mg by mouth at bedtime., Disp: , Rfl:  .  atorvastatin (LIPITOR) 80 MG tablet, Take 1 tablet (80 mg total) by mouth daily at 6 PM., Disp: 90 tablet, Rfl: 3 .  carvedilol (COREG) 3.125 MG tablet, Take 1 tablet (3.125 mg total) by mouth 2 (two) times daily with a meal., Disp: 180 tablet, Rfl: 3 .  Cetirizine HCl (ZYRTEC ALLERGY) 10 MG TBDP, Take 10 mg by mouth at bedtime., Disp: 30 tablet, Rfl: 0 .  cholestyramine (QUESTRAN) 4 GM/DOSE powder, USE 1 SCOOP 3 TIMES A DAY WITH A MEAL, Disp: 378 g, Rfl: 12 .  fluticasone (FLONASE) 50 MCG/ACT nasal spray, Place 2 sprays into both nostrils daily., Disp: 16 g, Rfl: 2 .  HYDROcodone-homatropine (HYCODAN) 5-1.5 MG/5ML syrup, Take 5 mLs by mouth every 8 (eight) hours as needed for cough., Disp: 120 mL, Rfl: 0 .  losartan (COZAAR) 25 MG tablet, Take 0.5 tablets (12.5 mg total) by mouth daily., Disp: 45 tablet, Rfl: 3 .  multivitamin-lutein (OCUVITE-LUTEIN) CAPS capsule, Take 1 capsule by mouth daily., Disp: , Rfl:  .  nitroGLYCERIN (NITROSTAT) 0.4 MG SL tablet, Place 1 tablet (0.4 mg total) under the tongue every 5 (five) minutes x 3 doses as needed for chest pain., Disp: 25 tablet, Rfl: 3 .  ticagrelor (BRILINTA) 90 MG TABS tablet, Take 1 tablet (90 mg total) by mouth 2 (two)  times daily., Disp: 180 tablet, Rfl: 3  Past Medical History: Past Medical History:  Diagnosis Date  . Amputated right leg ([place])   . CAD (coronary artery disease)    a. 09/2017 - acute MI -  emergent cath showing 99% stenosis in midLAD with thrombotic stenosis at the bifurcation of the second diagonal, which was relatively small in diameter. He underwent placement of DES to midLAD which jailed the second diagonal.   . Chronic combined systolic and diastolic heart failure ([place])   . Collagenous colitis    Dr. Festus Holts  . History of ST elevation myocardial infarction (STEMI)    09/2017: LAD  . History of stomach ulcers   . Hyperlipidemia LDL goal <70   . Ischemic cardiomyopathy     Tobacco Use: Social History   Tobacco Use  Smoking Status Former Smoker  . Packs/day: 1.00  . Years: 8.00  . Pack years: 8.00  . Types: Cigarettes  Smokeless Tobacco Never Used    Labs: Recent Review Flowsheet Data    Labs for ITP Cardiac and Pulmonary Rehab Latest Ref Rng & Units 10/23/2015 10/21/2016 10/07/2017 11/03/2017 11/09/2017   Cholestrol 100 - 199 mg/dL 167 166 188 110 112   LDLCALC 0 - 99 mg/dL 81 85 99 51 43   HDL >39 mg/dL 70 68 66 46 52   Trlycerides 0 - 149 mg/dL 79 63 113 57 83   TCO2 22 -  32 mmol/L - - 18(L) - -      Capillary Blood Glucose: No results found for: GLUCAP   Exercise Target Goals: Exercise Program Goal: Individual exercise prescription set using results from initial 6 min walk test and THRR while considering  patient's activity barriers and safety.   Exercise Prescription Goal: Initial exercise prescription builds to 30-45 minutes a day of aerobic activity, 2-3 days per week.  Home exercise guidelines will be given to patient during program as part of exercise prescription that the participant will acknowledge.  Activity Barriers & Risk Stratification: Activity Barriers & Cardiac Risk Stratification - 12/22/17 0946      Activity Barriers & Cardiac Risk  Stratification   Activity Barriers  Assistive Device;Other (comment)    Comments  Rt BKA- wears prosthesis. Prior back surgery.    Cardiac Risk Stratification  High       6 Minute Walk: 6 Minute Walk    Row Name 12/22/17 0856         6 Minute Walk   Phase  Initial     Distance  1333 feet     Walk Time  6 minutes     # of Rest Breaks  0     MPH  2.52     METS  2.4     RPE  12     Perceived Dyspnea   0     VO2 Peak  8.39     Symptoms  No     Resting HR  67 bpm     Resting BP  118/68     Resting Oxygen Saturation   98 %     Exercise Oxygen Saturation  during 6 min walk  99 %     Max Ex. HR  97 bpm     Max Ex. BP  122/68     2 Minute Post BP  108/78        Oxygen Initial Assessment:   Oxygen Re-Evaluation:   Oxygen Discharge (Final Oxygen Re-Evaluation):   Initial Exercise Prescription: Initial Exercise Prescription - 12/22/17 1000      Date of Initial Exercise RX and Referring Provider   Date  12/22/17    Referring Provider  Lauree Chandler, MD    Expected Discharge Date  03/29/18      NuStep   Level  2    SPM  85    Minutes  10    METs  2.5      Arm Ergometer   Level  1    Minutes  10    METs  2      Track   Laps  9    Minutes  10    METs  2.56      Prescription Details   Frequency (times per week)  3    Duration  Progress to 30 minutes of continuous aerobic without signs/symptoms of physical distress      Intensity   THRR 40-80% of Max Heartrate  54-109    Ratings of Perceived Exertion  11-13    Perceived Dyspnea  0-4      Progression   Progression  Continue to progress workloads to maintain intensity without signs/symptoms of physical distress.      Resistance Training   Training Prescription  Yes    Weight  3lbs    Reps  10-15       Perform Capillary Blood Glucose checks as needed.  Exercise Prescription Changes:  Exercise Prescription Changes  [place] Name 12/28/17 [phone] 01/04/18 0952 01/25/18 0950 02/01/18 0954 02/15/18  1022     Response to Exercise   Blood Pressure (Admit)  112/72  120/70  120/66  106/72  118/60   Blood Pressure (Exercise)  124/80  128/62  120/70  130/70  134/64   Blood Pressure (Exit)  108/72  108/78  102/60  122/70  122/62   Heart Rate (Admit)  73 bpm  75 bpm  71 bpm  86 bpm  76 bpm   Heart Rate (Exercise)  90 bpm  89 bpm  94 bpm  101 bpm  101 bpm   Heart Rate (Exit)  73 bpm  75 bpm  75 bpm  83 bpm  76 bpm   Rating of Perceived Exertion (Exercise)  12  13  13  13  13    Symptoms  none  none  none  none  none   Duration  Progress to 30 minutes of  aerobic without signs/symptoms of physical distress  Progress to 30 minutes of  aerobic without signs/symptoms of physical distress  Progress to 30 minutes of  aerobic without signs/symptoms of physical distress  Progress to 30 minutes of  aerobic without signs/symptoms of physical distress  Progress to 30 minutes of  aerobic without signs/symptoms of physical distress   Intensity  THRR unchanged  THRR unchanged  THRR unchanged  THRR unchanged  THRR unchanged     Progression   Progression  Continue to progress workloads to maintain intensity without signs/symptoms of physical distress.  Continue to progress workloads to maintain intensity without signs/symptoms of physical distress.  Continue to progress workloads to maintain intensity without signs/symptoms of physical distress.  Continue to progress workloads to maintain intensity without signs/symptoms of physical distress.  Continue to progress workloads to maintain intensity without signs/symptoms of physical distress.   Average METs  2.4  2.5  2.5  2.4  2.6     Resistance Training   Training Prescription  Yes  Yes  Yes  Yes  Yes   Weight  3lbs  3lbs  3lbs  3lbs  3lbs   Reps  10-15  10-15  10-15  10-15  10-15   Time  10 Minutes  10 Minutes  10 Minutes  10 Minutes  10 Minutes     Interval Training   Interval Training  No  No  No  No  No     NuStep   Level  2  5  5  5  5    SPM  85  85  85   85  85   Minutes  10  10  10  10  10    METs  1.9  1.9  2.3  2.1  2.5     Arm Ergometer   Level  1  2  2   2.3  2.6   Minutes  10  10  10  10  10      Track   Laps  11  12  10  10  10    Minutes  10  10  10  10  10    METs  2.91  2.91  2.74  2.74  2.74     Home Exercise Plan   Plans to continue exercise at  -  [employer] (comment)  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  Add 1 additional day to program exercise  sessions.  Add 1 additional day to program exercise sessions.  Add 1 additional day to program exercise sessions.   Initial Home Exercises Provided  -  01/04/18  01/04/18  01/04/18  01/04/18      Exercise Comments:  Exercise Comments    Row Name 12/28/17 1052 01/04/18 1018 01/06/18 1030 01/25/18 1017 02/01/18 1028   Exercise Comments  Patient tolerated first session of exercise well without symptoms.  Reviewed home exercise guidelines, METs, and goals with patient.  Reviewed METs and goals with patient.  Reviewed METs with patient.  Reviewed goals with patient.   [place] Name 02/15/18 1022           Exercise Comments  Reviewed METs with patient.          Exercise Goals and Review:  Exercise Goals    Row Name 12/22/17 0950             Exercise Goals   Increase Physical Activity  Yes       Intervention  Provide advice, education, support and counseling about physical activity/exercise needs.;Develop an individualized exercise prescription for aerobic and resistive training based on initial evaluation findings, risk stratification, comorbidities and participant's personal goals.       Expected Outcomes  Short Term: Attend rehab on a regular basis to increase amount of physical activity.;Long Term: Exercising regularly at least 3-5 days a week.;Long Term: Add in home exercise to make exercise part of routine and to increase amount of physical activity.        Increase Strength and Stamina  Yes       Intervention  Provide advice, education, support and counseling about physical activity/exercise needs.;Develop an individualized exercise prescription for aerobic and resistive training based on initial evaluation findings, risk stratification, comorbidities and participant's personal goals.       Expected Outcomes  Short Term: Increase workloads from initial exercise prescription for resistance, speed, and METs.;Short Term: Perform resistance training exercises routinely during rehab and add in resistance training at home;Long Term: Improve cardiorespiratory fitness, muscular endurance and strength as measured by increased METs and functional capacity (6MWT)       Able to understand and use rate of perceived exertion (RPE) scale  Yes       Intervention  Provide education and explanation on how to use RPE scale       Expected Outcomes  Short Term: Able to use RPE daily in rehab to express subjective intensity level;Long Term:  Able to use RPE to guide intensity level when exercising independently       Knowledge and understanding of Target Heart Rate Range (THRR)  Yes       Intervention  Provide education and explanation of THRR including how the numbers were predicted and where they are located for reference       Expected Outcomes  Short Term: Able to state/look up THRR;Long Term: Able to use THRR to govern intensity when exercising independently;Short Term: Able to use daily as guideline for intensity in rehab       Able to check pulse independently  Yes       Intervention  Provide education and demonstration on how to check pulse in carotid and radial arteries.;Review the importance of being able to check your own pulse for safety during independent exercise       Expected Outcomes  Short Term: Able to explain why pulse checking is important during independent exercise;Long Term: Able to check pulse independently and accurately  Understanding of  Exercise Prescription  Yes       Intervention  Provide education, explanation, and written materials on patient's individual exercise prescription       Expected Outcomes  Short Term: Able to explain program exercise prescription;Long Term: Able to explain home exercise prescription to exercise independently          Exercise Goals Re-Evaluation : Exercise Goals Re-Evaluation    Row Name 12/28/17 1052 01/04/18 1018 01/06/18 1030 02/01/18 1028       Exercise Goal Re-Evaluation   Exercise Goals Review  Increase Physical Activity;Able to understand and use rate of perceived exertion (RPE) scale  Increase Physical Activity;Able to understand and use rate of perceived exertion (RPE) scale;Knowledge and understanding of Target Heart Rate Range (THRR);Understanding of Exercise Prescription  Increase Physical Activity;Able to understand and use rate of perceived exertion (RPE) scale;Knowledge and understanding of Target Heart Rate Range (THRR);Understanding of Exercise Prescription  Increase Physical Activity;Able to understand and use rate of perceived exertion (RPE) scale;Knowledge and understanding of Target Heart Rate Range (THRR);Understanding of Exercise Prescription    Comments  Patient able to understand and use RPE scale appropriately.  Reviewed home exercise guidelines including THRR, RPE scale, and endpoints for exercise. [facility] and will try to walk 1 day/week at home.  [place]   Patient is walking some at home is limited because leg amputation, wearing prosthesis.    Expected Outcomes  Increase workloads as tolerated to help improve cardiorespiratory fitness.  Patient will exercise 30 minutes, 1-2 days/week in addition to exercise at cardiac rehab.  Progress workloads to increase cardiorespiratoy fitness.   Continue to increase workloads at cardiac rehab as tolerated.       Discharge Exercise Prescription (Final Exercise  Prescription Changes): Exercise Prescription Changes - 02/15/18 1022      Response to Exercise   Blood Pressure (Admit)  118/60    Blood Pressure (Exercise)  134/64    Blood Pressure (Exit)  122/62    Heart Rate (Admit)  76 bpm    Heart Rate (Exercise)  101 bpm    Heart Rate (Exit)  76 bpm    Rating of Perceived Exertion (Exercise)  13    Symptoms  none    Duration  Progress to 30 minutes of  aerobic without signs/symptoms of physical distress    Intensity  THRR unchanged      Progression   Progression  Continue to progress workloads to maintain intensity without signs/symptoms of physical distress.    Average METs  2.6      Resistance Training   Training Prescription  Yes    Weight  3lbs    Reps  10-15    Time  10 Minutes      Interval Training   Interval Training  No      NuStep   Level  5    SPM  85    Minutes  10    METs  2.5      Arm Ergometer   Level  2.6    Minutes  10      Track   Laps  10    Minutes  10    METs  2.74      Home Exercise Plan   Plans to continue exercise at  [facility] (comment)    Frequency  Add 1 additional day to program exercise sessions.    Initial  Home Exercises Provided  01/04/18       Nutrition:  Target Goals: Understanding of nutrition guidelines, daily intake of sodium 1500mg , cholesterol 200mg , calories 30% from fat and 7% or less from saturated fats, daily to have 5 or more servings of fruits and vegetables.  Biometrics: Pre Biometrics - 12/22/17 0841      Pre Biometrics   Height  5\' 8"  (1.727 m)    Weight  68.1 kg    Waist Circumference  35.5 inches    Hip Circumference  37 inches    Waist to Hip Ratio  0.96 %    BMI (Calculated)  22.83    Triceps Skinfold  9 mm    % Body Fat  22.2 %    Grip Strength  30.5 kg    Flexibility  10 in    Single Leg Stand  10.97 seconds        Nutrition Therapy Plan and Nutrition Goals: Nutrition Therapy & Goals - 01/18/18 1020      Nutrition Therapy   Diet  general  healthful      Personal Nutrition Goals   Nutrition Goal  pt to identify and limit food sources of sodium, saturated fat, trans fats, and refined carbohydrates    Personal Goal #2  pt to decrease fried food consumption      Intervention Plan   Intervention  Prescribe, educate and counsel regarding individualized specific dietary modifications aiming towards targeted core components such as weight, hypertension, lipid management, diabetes, heart failure and other comorbidities.    Expected Outcomes  Short Term Goal: Understand basic principles of dietary content, such as calories, fat, sodium, cholesterol and nutrients.;Long Term Goal: Adherence to prescribed nutrition plan.       Nutrition Assessments: Nutrition Assessments - 01/18/18 1026      MEDFICTS Scores   Pre Score  40       Nutrition Goals Re-Evaluation: Nutrition Goals Re-Evaluation    Row Name 01/18/18 1020             Goals   Current Weight  150 lb 9.2 oz (68.3 kg)          Nutrition Goals Re-Evaluation: Nutrition Goals Re-Evaluation    [place] Name 01/18/18 1020             Goals   Current Weight  150 lb 9.2 oz (68.3 kg)          Nutrition Goals Discharge (Final Nutrition Goals Re-Evaluation): Nutrition Goals Re-Evaluation - 01/18/18 1020      Goals   Current Weight  150 lb 9.2 oz (68.3 kg)       Psychosocial: Target Goals: Acknowledge presence or absence of significant depression and/or stress, maximize coping skills, provide positive support system. Participant is able to verbalize types and ability to use techniques and skills needed for reducing stress and depression.  Initial Review & Psychosocial Screening: Initial Psych Review & Screening - 12/22/17 0912      Initial Review   Current issues with  None Identified      Family Dynamics   Good Support System?  Yes   wife, daughters      Barriers   Psychosocial barriers to participate in program  There are no identifiable barriers or  psychosocial needs.      Screening Interventions   Interventions  Encouraged to exercise       Quality of Life Scores: Quality of Life - 12/22/17 0912      Quality of  Life   Select  Quality of Life      Quality of Life Scores   Health/Function Pre  26.1 %    Socioeconomic Pre  29.2 %    Psych/Spiritual Pre  29.1 %    Family Pre  30 %    GLOBAL Pre  27.9 %      Scores of 19 and below usually indicate a poorer quality of life in these areas.  A difference of  2-3 points is a clinically meaningful difference.  A difference of 2-3 points in the total score of the Quality of Life Index has been associated with significant improvement in overall quality of life, self-image, physical symptoms, and general health in studies assessing change in quality of life.  PHQ-9: Recent Review Flowsheet Data    Depression screen [facility] 2/9 12/28/2017 11/03/2017 11/03/2017 04/21/2017 10/28/2016   Decreased Interest 0 0 0 1 0   Down, Depressed, Hopeless 0 0 0 1 0   PHQ - 2 Score 0 0 0 2 0   Altered sleeping - - - 2 -   Tired, decreased energy - - - 1 -   Change in appetite - - - 1 -   Feeling bad or failure about yourself  - - - 0 -   Trouble concentrating - - - 1 -   Moving slowly or fidgety/restless - - - 0 -   Suicidal thoughts - - - 0 -   PHQ-9 Score - - - 7 -   Difficult doing work/chores - - - Somewhat difficult -     Interpretation of Total Score  Total Score Depression Severity:  1-4 = Minimal depression, 5-9 = Mild depression, 10-14 = Moderate depression, 15-19 = Moderately severe depression, 20-27 = Severe depression   Psychosocial Evaluation and Intervention:   Psychosocial Re-Evaluation: Psychosocial Re-Evaluation    Row Name 01/14/18 1135 02/03/18 1356 02/24/18 1233         Psychosocial Re-Evaluation   Current issues with  None Identified  None Identified  None Identified     Interventions  Encouraged to attend Cardiac Rehabilitation for the exercise  Encouraged to attend  Cardiac Rehabilitation for the exercise  Encouraged to attend Cardiac Rehabilitation for the exercise     Continue Psychosocial Services   No Follow up required  No Follow up required  No Follow up required        Psychosocial Discharge (Final Psychosocial Re-Evaluation): Psychosocial Re-Evaluation - 02/24/18 1233      Psychosocial Re-Evaluation   Current issues with  None Identified    Interventions  Encouraged to attend Cardiac Rehabilitation for the exercise    Continue Psychosocial Services   No Follow up required       Vocational Rehabilitation: Provide vocational rehab assistance to qualifying candidates.   Vocational Rehab Evaluation & Intervention: Vocational Rehab - 12/22/17 0913      Initial Vocational Rehab Evaluation & Intervention   Assessment shows need for Vocational Rehabilitation  No   retired Armed forces logistics/support/administrative officer       Education: Education Goals: Education classes will be provided on a weekly basis, covering required topics. Participant will state understanding/return demonstration of topics presented.  Learning Barriers/Preferences: Learning Barriers/Preferences - 12/22/17 1154      Learning Barriers/Preferences   Learning Barriers  Hearing;Sight   Bilateral hearing aides, reading glasses   Learning Preferences  Skilled Demonstration       Education Topics: Count Your Pulse:  -Group instruction provided by verbal instruction, demonstration,  patient participation and written materials to support subject.  Instructors address importance of being able to find your pulse and how to count your pulse when at home without a heart monitor.  Patients get hands on experience counting their pulse with staff help and individually.   CARDIAC REHAB PHASE II EXERCISE from 02/24/2018 in [place]  Date  01/08/18  Instruction Review Code  2- Demonstrated Understanding      Heart Attack, Angina, and Risk Factor Modification:  -Group  instruction provided by verbal instruction, video, and written materials to support subject.  Instructors address signs and symptoms of angina and heart attacks.    Also discuss risk factors for heart disease and how to make changes to improve heart health risk factors.   CARDIAC REHAB PHASE II EXERCISE from 02/24/2018 in [place]  Date  02/24/18  Educator  RN  Instruction Review Code  2- Demonstrated Understanding      Functional Fitness:  -Group instruction provided by verbal instruction, demonstration, patient participation, and written materials to support subject.  Instructors address safety measures for doing things around the house.  Discuss how to get up and down off the floor, how to pick things up properly, how to safely get out of a chair without assistance, and balance training.   Meditation and Mindfulness:  -Group instruction provided by verbal instruction, patient participation, and written materials to support subject.  Instructor addresses importance of mindfulness and meditation practice to help reduce stress and improve awareness.  Instructor also leads participants through a meditation exercise.    Stretching for Flexibility and Mobility:  -Group instruction provided by verbal instruction, patient participation, and written materials to support subject.  Instructors lead participants through series of stretches that are designed to increase flexibility thus improving mobility.  These stretches are additional exercise for major muscle groups that are typically performed during regular warm up and cool down.   Hands Only CPR:  -Group verbal, video, and participation provides a basic overview of AHA guidelines for community CPR. Role-play of emergencies allow participants the opportunity to practice calling for help and chest compression technique with discussion of AED use.   Hypertension: -Group verbal and written instruction that provides  a basic overview of hypertension including the most recent diagnostic guidelines, risk factor reduction with self-care instructions and medication management.    Nutrition I class: Heart Healthy Eating:  -Group instruction provided by PowerPoint slides, verbal discussion, and written materials to support subject matter. The instructor gives an explanation and review of the Therapeutic Lifestyle Changes diet recommendations, which includes a discussion on lipid goals, dietary fat, sodium, fiber, plant stanol/sterol esters, sugar, and the components of a well-balanced, healthy diet.   Nutrition II class: Lifestyle Skills:  -Group instruction provided by PowerPoint slides, verbal discussion, and written materials to support subject matter. The instructor gives an explanation and review of label reading, grocery shopping for heart health, heart healthy recipe modifications, and ways to make healthier choices when eating out.   Diabetes Question & Answer:  -Group instruction provided by PowerPoint slides, verbal discussion, and written materials to support subject matter. The instructor gives an explanation and review of diabetes co-morbidities, pre- and post-prandial blood glucose goals, pre-exercise blood glucose goals, signs, symptoms, and treatment of hypoglycemia and hyperglycemia, and foot care basics.   Diabetes Blitz:  -Group instruction provided by PowerPoint slides, verbal discussion, and written materials to support subject matter. The instructor gives an explanation  and review of the physiology behind type 1 and type 2 diabetes, diabetes medications and rational behind using different medications, pre- and post-prandial blood glucose recommendations and Hemoglobin A1c goals, diabetes diet, and exercise including blood glucose guidelines for exercising safely.    Portion Distortion:  -Group instruction provided by PowerPoint slides, verbal discussion, written materials, and food models to  support subject matter. The instructor gives an explanation of serving size versus portion size, changes in portions sizes over the last 20 years, and what consists of a serving from each food group.   Stress Management:  -Group instruction provided by verbal instruction, video, and written materials to support subject matter.  Instructors review role of stress in heart disease and how to cope with stress positively.     Exercising on Your Own:  -Group instruction provided by verbal instruction, power point, and written materials to support subject.  Instructors discuss benefits of exercise, components of exercise, frequency and intensity of exercise, and end points for exercise.  Also discuss use of nitroglycerin and activating EMS.  Review options of places to exercise outside of rehab.  Review guidelines for sex with heart disease.   Cardiac Drugs I:  -Group instruction provided by verbal instruction and written materials to support subject.  Instructor reviews cardiac drug classes: antiplatelets, anticoagulants, beta blockers, and statins.  Instructor discusses reasons, side effects, and lifestyle considerations for each drug class.   CARDIAC REHAB PHASE II EXERCISE from 02/24/2018 in [place]  Date  01/27/18  Educator  Pharmacist  Instruction Review Code  2- Demonstrated Understanding      Cardiac Drugs II:  -Group instruction provided by verbal instruction and written materials to support subject.  Instructor reviews cardiac drug classes: angiotensin converting enzyme inhibitors (ACE-I), angiotensin II receptor blockers (ARBs), nitrates, and calcium channel blockers.  Instructor discusses reasons, side effects, and lifestyle considerations for each drug class.   Anatomy and Physiology of the Circulatory System:  Group verbal and written instruction and models provide basic cardiac anatomy and physiology, with the coronary electrical and arterial  systems. Review of: AMI, Angina, Valve disease, Heart Failure, Peripheral Artery Disease, Cardiac Arrhythmia, Pacemakers, and the ICD.   Other Education:  -Group or individual verbal, written, or video instructions that support the educational goals of the cardiac rehab program.   Holiday Eating Survival Tips:  -Group instruction provided by PowerPoint slides, verbal discussion, and written materials to support subject matter. The instructor gives patients tips, tricks, and techniques to help them not only survive but enjoy the holidays despite the onslaught of food that accompanies the holidays.   Knowledge Questionnaire Score: Knowledge Questionnaire Score - 12/22/17 0914      Knowledge Questionnaire Score   Pre Score  13/24       Core Components/Risk Factors/Patient Goals at Admission: Personal Goals and Risk Factors at Admission - 12/22/17 1159      Core Components/Risk Factors/Patient Goals on Admission    Weight Management  Weight Gain;Yes    Intervention  Weight Management: Develop a combined nutrition and exercise program designed to reach desired caloric intake, while maintaining appropriate intake of nutrient and fiber, sodium and fats, and appropriate energy expenditure required for the weight goal.;Weight Management: Provide education and appropriate resources to help participant work on and attain dietary goals.    Admit Weight  150 lb 2.1 oz (68.1 kg)    Expected Outcomes  Weight Gain: Understanding of general recommendations for a high calorie, high protein  meal plan that promotes weight gain by distributing calorie intake throughout the day with the consumption for 4-5 meals, snacks, and/or supplements;Short Term: Continue to assess and modify interventions until short term weight is achieved;Long Term: Adherence to nutrition and physical activity/exercise program aimed toward attainment of established weight goal    Heart Failure  Yes    Intervention  Provide a combined  exercise and nutrition program that is supplemented with education, support and counseling about heart failure. Directed toward relieving symptoms such as shortness of breath, decreased exercise tolerance, and extremity edema.    Expected Outcomes  Improve functional capacity of life;Short term: Attendance in program 2-3 days a week with increased exercise capacity. Reported lower sodium intake. Reported increased fruit and vegetable intake. Reports medication compliance.;Short term: Daily weights obtained and reported for increase. Utilizing diuretic protocols set by physician.;Long term: Adoption of self-care skills and reduction of barriers for early signs and symptoms recognition and intervention leading to self-care maintenance.    Lipids  Yes    Intervention  Provide education and support for participant on nutrition & aerobic/resistive exercise along with prescribed medications to achieve LDL 70mg , HDL >40mg .    Expected Outcomes  Short Term: Participant states understanding of desired cholesterol values and is compliant with medications prescribed. Participant is following exercise prescription and nutrition guidelines.;Long Term: Cholesterol controlled with medications as prescribed, with individualized exercise RX and with personalized nutrition plan. Value goals: LDL < 70mg , HDL > 40 mg.    Personal Goal Other  Yes    Personal Goal  Participant would like to be able to walk with less SOB.    Intervention  Provide aerobic exercise program to help improve exercise tolerance, energy, and stamina and decrease breathlessness with walking.    Expected Outcomes  Patient will increase cardiorespiratory fitness through regular exercise routine and be less SOB with walking.       Core Components/Risk Factors/Patient Goals Review:  Goals and Risk Factor Review    Row Name 01/14/18 1136 02/03/18 1356 02/24/18 1235         Core Components/Risk Factors/Patient Goals Review   Personal Goals Review   Weight Management/Obesity;Lipids;Heart Failure;Hypertension  Weight Management/Obesity;Lipids;Heart Failure;Hypertension  Weight Management/Obesity;Lipids;Heart Failure;Hypertension     Review  Laverna Peace has maintianed his current weight. Jimm's vital signs have been stable at phase 2 cardiac rehab.Laverna Peace is doing well with exercise  Laverna Peace has maintianed his current weight. Jimm's vital signs have been stable at phase 2 cardiac rehab.Laverna Peace is doing well with exercise  Laverna Peace has maintianed his current weight. Jimm's vital signs have been stable at phase 2 cardiac rehab.Laverna Peace is doing well with exercise     Expected Outcomes  Laverna Peace will continue to participate in phase 2 cardiac rehab, follow nutrition and lifestyle modfication opportunties.  Laverna Peace will continue to participate in phase 2 cardiac rehab, follow nutrition and lifestyle modfication opportunties.  Laverna Peace will continue to participate in phase 2 cardiac rehab, follow nutrition and lifestyle modfication opportunties.        Core Components/Risk Factors/Patient Goals at Discharge (Final Review):  Goals and Risk Factor Review - 02/24/18 1235      Core Components/Risk Factors/Patient Goals Review   Personal Goals Review  Weight Management/Obesity;Lipids;Heart Failure;Hypertension    Review  Laverna Peace has maintianed his current weight. Jimm's vital signs have been stable at phase 2 cardiac rehab.Laverna Peace is doing well with exercise    Expected Outcomes  Laverna Peace will continue to participate in phase 2 cardiac  rehab, follow nutrition and lifestyle modfication opportunties.       ITP Comments: ITP Comments    Row Name 12/22/17 0856 01/14/18 1134 02/03/18 1403 02/24/18 1233     ITP Comments  Dr. Fransico Him, Medical Director   30 Day ITP Review. Laverna Peace is with good partcipation and attendance in phase 2 cardiac rehab.  30 Day ITP Review. Laverna Peace is with good partcipation and attendance in phase 2 cardiac rehab.  30 Day ITP Review. Laverna Peace is with good  partcipation and attendance in phase 2 cardiac rehab.       Comments: See ITP comments.Barnet Pall, RN,BSN 02/25/2018 10:22 AM

## 2018-02-26 ENCOUNTER — Encounter (HOSPITAL_COMMUNITY)
Admission: RE | Admit: 2018-02-26 | Discharge: 2018-02-26 | Disposition: A | Discharge status: Home / Self Care | Payer: PPO | Source: Ambulatory Visit | Attending: Cardiovascular Disease | Admitting: Cardiovascular Disease

## 2018-02-26 ENCOUNTER — Encounter (HOSPITAL_COMMUNITY): Payer: PPO

## 2018-02-26 DIAGNOSIS — Z955 Presence of coronary angioplasty implant and graft: Secondary | ICD-10-CM

## 2018-02-26 DIAGNOSIS — I213 ST elevation (STEMI) myocardial infarction of unspecified site: Secondary | ICD-10-CM

## 2018-03-01 ENCOUNTER — Encounter (HOSPITAL_COMMUNITY)
Admission: RE | Admit: 2018-03-01 | Discharge: 2018-03-01 | Disposition: A | Discharge status: Home / Self Care | Payer: PPO | Source: Ambulatory Visit | Attending: Cardiovascular Disease | Admitting: Cardiovascular Disease

## 2018-03-01 ENCOUNTER — Encounter (HOSPITAL_COMMUNITY): Payer: PPO

## 2018-03-01 DIAGNOSIS — Z955 Presence of coronary angioplasty implant and graft: Secondary | ICD-10-CM

## 2018-03-01 DIAGNOSIS — I213 ST elevation (STEMI) myocardial infarction of unspecified site: Secondary | ICD-10-CM

## 2018-03-03 ENCOUNTER — Encounter (HOSPITAL_COMMUNITY): Payer: PPO

## 2018-03-03 ENCOUNTER — Encounter (HOSPITAL_COMMUNITY)
Admission: RE | Admit: 2018-03-03 | Discharge: 2018-03-03 | Disposition: A | Discharge status: Home / Self Care | Payer: PPO | Source: Ambulatory Visit | Attending: Cardiovascular Disease | Admitting: Cardiovascular Disease

## 2018-03-03 DIAGNOSIS — Z955 Presence of coronary angioplasty implant and graft: Secondary | ICD-10-CM

## 2018-03-03 DIAGNOSIS — I213 ST elevation (STEMI) myocardial infarction of unspecified site: Secondary | ICD-10-CM

## 2018-03-05 ENCOUNTER — Encounter (HOSPITAL_COMMUNITY): Payer: PPO

## 2018-03-05 ENCOUNTER — Encounter (HOSPITAL_COMMUNITY)
Admission: RE | Admit: 2018-03-05 | Discharge: 2018-03-05 | Disposition: A | Discharge status: Home / Self Care | Payer: PPO | Source: Ambulatory Visit | Attending: Cardiovascular Disease | Admitting: Cardiovascular Disease

## 2018-03-05 DIAGNOSIS — I213 ST elevation (STEMI) myocardial infarction of unspecified site: Secondary | ICD-10-CM

## 2018-03-05 DIAGNOSIS — Z955 Presence of coronary angioplasty implant and graft: Secondary | ICD-10-CM

## 2018-03-08 ENCOUNTER — Encounter (HOSPITAL_COMMUNITY)
Admission: RE | Admit: 2018-03-08 | Discharge: 2018-03-08 | Disposition: A | Discharge status: Home / Self Care | Payer: PPO | Source: Ambulatory Visit | Attending: Cardiovascular Disease | Admitting: Cardiovascular Disease

## 2018-03-08 ENCOUNTER — Encounter (HOSPITAL_COMMUNITY): Payer: PPO

## 2018-03-08 DIAGNOSIS — I213 ST elevation (STEMI) myocardial infarction of unspecified site: Secondary | ICD-10-CM

## 2018-03-08 DIAGNOSIS — Z955 Presence of coronary angioplasty implant and graft: Secondary | ICD-10-CM

## 2018-03-12 ENCOUNTER — Encounter (HOSPITAL_COMMUNITY)
Admission: RE | Admit: 2018-03-12 | Discharge: 2018-03-12 | Disposition: A | Discharge status: Home / Self Care | Payer: PPO | Source: Ambulatory Visit | Attending: Cardiovascular Disease | Admitting: Cardiovascular Disease

## 2018-03-12 ENCOUNTER — Encounter (HOSPITAL_COMMUNITY): Payer: PPO

## 2018-03-12 DIAGNOSIS — I213 ST elevation (STEMI) myocardial infarction of unspecified site: Secondary | ICD-10-CM

## 2018-03-12 DIAGNOSIS — Z955 Presence of coronary angioplasty implant and graft: Secondary | ICD-10-CM

## 2018-03-15 ENCOUNTER — Encounter (HOSPITAL_COMMUNITY)
Admission: RE | Admit: 2018-03-15 | Discharge: 2018-03-15 | Disposition: A | Discharge status: Home / Self Care | Payer: PPO | Source: Ambulatory Visit | Attending: Cardiovascular Disease | Admitting: Cardiovascular Disease

## 2018-03-15 ENCOUNTER — Encounter (HOSPITAL_COMMUNITY): Payer: PPO

## 2018-03-15 DIAGNOSIS — Z955 Presence of coronary angioplasty implant and graft: Secondary | ICD-10-CM

## 2018-03-15 DIAGNOSIS — I213 ST elevation (STEMI) myocardial infarction of unspecified site: Secondary | ICD-10-CM

## 2018-03-19 ENCOUNTER — Encounter (HOSPITAL_COMMUNITY): Payer: PPO

## 2018-03-19 ENCOUNTER — Encounter (HOSPITAL_COMMUNITY)
Admission: RE | Admit: 2018-03-19 | Discharge: 2018-03-19 | Disposition: A | Discharge status: Home / Self Care | Payer: PPO | Source: Ambulatory Visit | Attending: Cardiovascular Disease | Admitting: Cardiovascular Disease

## 2018-03-19 DIAGNOSIS — Z955 Presence of coronary angioplasty implant and graft: Secondary | ICD-10-CM

## 2018-03-19 DIAGNOSIS — I213 ST elevation (STEMI) myocardial infarction of unspecified site: Secondary | ICD-10-CM | POA: Diagnosis not present

## 2018-03-22 ENCOUNTER — Ambulatory Visit: Payer: PPO | Admitting: Cardiovascular Disease

## 2018-03-22 ENCOUNTER — Encounter (HOSPITAL_COMMUNITY)
Admission: RE | Admit: 2018-03-22 | Discharge: 2018-03-22 | Disposition: A | Discharge status: Home / Self Care | Payer: PPO | Source: Ambulatory Visit | Attending: Cardiovascular Disease | Admitting: Cardiovascular Disease

## 2018-03-22 ENCOUNTER — Encounter (HOSPITAL_COMMUNITY): Payer: PPO

## 2018-03-22 DIAGNOSIS — I213 ST elevation (STEMI) myocardial infarction of unspecified site: Secondary | ICD-10-CM | POA: Diagnosis not present

## 2018-03-22 DIAGNOSIS — Z955 Presence of coronary angioplasty implant and graft: Secondary | ICD-10-CM

## 2018-03-23 NOTE — Progress Notes (Signed)
Cardiac Individual Treatment Plan  Patient Details  Name: Eugene [name] MRN: [id number] Date of Birth: 1931/12/22 Referring Provider:     [place] from 12/22/2017 in [place]  Referring Provider  Lauree Chandler, MD      Initial Encounter Date:    CARDIAC REHAB PHASE II ORIENTATION from 12/22/2017 in [place]  Date  12/22/17      Visit Diagnosis: 10/07/2017 Stented coronary artery, S/P DES LAD  10/07/2017 ST elevation myocardial infarction (STEMI), unspecified artery ([place])  Patient's Home Medications on Admission:  Current Outpatient Medications:  .  aspirin 81 MG tablet, Take 81 mg by mouth at bedtime., Disp: , Rfl:  .  atorvastatin (LIPITOR) 80 MG tablet, Take 1 tablet (80 mg total) by mouth daily at 6 PM., Disp: 90 tablet, Rfl: 3 .  carvedilol (COREG) 3.125 MG tablet, Take 1 tablet (3.125 mg total) by mouth 2 (two) times daily with a meal., Disp: 180 tablet, Rfl: 3 .  Cetirizine HCl (ZYRTEC ALLERGY) 10 MG TBDP, Take 10 mg by mouth at bedtime., Disp: 30 tablet, Rfl: 0 .  cholestyramine (QUESTRAN) 4 GM/DOSE powder, USE 1 SCOOP 3 TIMES A DAY WITH A MEAL, Disp: 378 g, Rfl: 12 .  fluticasone (FLONASE) 50 MCG/ACT nasal spray, Place 2 sprays into both nostrils daily., Disp: 16 g, Rfl: 2 .  HYDROcodone-homatropine (HYCODAN) 5-1.5 MG/5ML syrup, Take 5 mLs by mouth every 8 (eight) hours as needed for cough., Disp: 120 mL, Rfl: 0 .  losartan (COZAAR) 25 MG tablet, Take 0.5 tablets (12.5 mg total) by mouth daily., Disp: 45 tablet, Rfl: 3 .  multivitamin-lutein (OCUVITE-LUTEIN) CAPS capsule, Take 1 capsule by mouth daily., Disp: , Rfl:  .  nitroGLYCERIN (NITROSTAT) 0.4 MG SL tablet, Place 1 tablet (0.4 mg total) under the tongue every 5 (five) minutes x 3 doses as needed for chest pain., Disp: 25 tablet, Rfl: 3 .  ticagrelor (BRILINTA) 90 MG TABS tablet, Take 1 tablet (90 mg total) by mouth 2 (two)  times daily., Disp: 180 tablet, Rfl: 3  Past Medical History: Past Medical History:  Diagnosis Date  . Amputated right leg ([place])   . CAD (coronary artery disease)    a. 09/2017 - acute MI -  emergent cath showing 99% stenosis in midLAD with thrombotic stenosis at the bifurcation of the second diagonal, which was relatively small in diameter. He underwent placement of DES to midLAD which jailed the second diagonal.   . Chronic combined systolic and diastolic heart failure ([place])   . Collagenous colitis    Dr. Festus Holts  . History of ST elevation myocardial infarction (STEMI)    09/2017: LAD  . History of stomach ulcers   . Hyperlipidemia LDL goal <70   . Ischemic cardiomyopathy     Tobacco Use: Social History   Tobacco Use  Smoking Status Former Smoker  . Packs/day: 1.00  . Years: 8.00  . Pack years: 8.00  . Types: Cigarettes  Smokeless Tobacco Never Used    Labs: Recent Review Flowsheet Data    Labs for ITP Cardiac and Pulmonary Rehab Latest Ref Rng & Units 10/23/2015 10/21/2016 10/07/2017 11/03/2017 11/09/2017   Cholestrol 100 - 199 mg/dL 167 166 188 110 112   LDLCALC 0 - 99 mg/dL 81 85 99 51 43   HDL >39 mg/dL 70 68 66 46 52   Trlycerides 0 - 149 mg/dL 79 63 113 57 83   TCO2 22 -  32 mmol/L - - 18(L) - -      Capillary Blood Glucose: No results found for: GLUCAP   Exercise Target Goals: Exercise Program Goal: Individual exercise prescription set using results from initial 6 min walk test and THRR while considering  patient's activity barriers and safety.   Exercise Prescription Goal: Initial exercise prescription builds to 30-45 minutes a day of aerobic activity, 2-3 days per week.  Home exercise guidelines will be given to patient during program as part of exercise prescription that the participant will acknowledge.  Activity Barriers & Risk Stratification: Activity Barriers & Cardiac Risk Stratification - 12/22/17 0946      Activity Barriers & Cardiac Risk  Stratification   Activity Barriers  Assistive Device;Other (comment)    Comments  Rt BKA- wears prosthesis. Prior back surgery.    Cardiac Risk Stratification  High       6 Minute Walk: 6 Minute Walk    Row Name 12/22/17 0856 03/22/18 1013       6 Minute Walk   Phase  Initial  Discharge    Distance  1333 feet  1641 feet    Distance % Change  -  23.11 %    Walk Time  6 minutes  6 minutes    # of Rest Breaks  0  1    MPH  2.52  3.11    METS  2.4  2.9    RPE  12  10    Perceived Dyspnea   0  0    VO2 Peak  8.39  10.13    Symptoms  No  No    Resting HR  67 bpm  81 bpm    Resting BP  118/68  108/60    Resting Oxygen Saturation   98 %  -    Exercise Oxygen Saturation  during 6 min walk  99 %  -    Max Ex. HR  97 bpm  107 bpm    Max Ex. BP  122/68  108/72    2 Minute Post BP  108/78  106/62       Oxygen Initial Assessment:   Oxygen Re-Evaluation:   Oxygen Discharge (Final Oxygen Re-Evaluation):   Initial Exercise Prescription: Initial Exercise Prescription - 12/22/17 1000      Date of Initial Exercise RX and Referring Provider   Date  12/22/17    Referring Provider  Lauree Chandler, MD    Expected Discharge Date  03/29/18      NuStep   Level  2    SPM  85    Minutes  10    METs  2.5      Arm Ergometer   Level  1    Minutes  10    METs  2      Track   Laps  9    Minutes  10    METs  2.56      Prescription Details   Frequency (times per week)  3    Duration  Progress to 30 minutes of continuous aerobic without signs/symptoms of physical distress      Intensity   THRR 40-80% of Max Heartrate  54-109    Ratings of Perceived Exertion  11-13    Perceived Dyspnea  0-4      Progression   Progression  Continue to progress workloads to maintain intensity without signs/symptoms of physical distress.      Resistance Training   Training Prescription  Yes  Weight  3lbs    Reps  10-15       Perform Capillary Blood Glucose checks as  needed.  Exercise Prescription Changes:  Exercise Prescription Changes    Row Name 12/28/17 0954 01/04/18 0952 01/25/18 0950 02/01/18 0954 02/15/18 1022     Response to Exercise   Blood Pressure (Admit)  112/72  120/70  120/66  106/72  118/60   Blood Pressure (Exercise)  124/80  128/62  120/70  130/70  134/64   Blood Pressure (Exit)  108/72  108/78  102/60  122/70  122/62   Heart Rate (Admit)  73 bpm  75 bpm  71 bpm  86 bpm  76 bpm   Heart Rate (Exercise)  90 bpm  89 bpm  94 bpm  101 bpm  101 bpm   Heart Rate (Exit)  73 bpm  75 bpm  75 bpm  83 bpm  76 bpm   Rating of Perceived Exertion (Exercise)  12  13  13  13  13    Symptoms  none  none  none  none  none   Duration  Progress to 30 minutes of  aerobic without signs/symptoms of physical distress  Progress to 30 minutes of  aerobic without signs/symptoms of physical distress  Progress to 30 minutes of  aerobic without signs/symptoms of physical distress  Progress to 30 minutes of  aerobic without signs/symptoms of physical distress  Progress to 30 minutes of  aerobic without signs/symptoms of physical distress   Intensity  THRR unchanged  THRR unchanged  THRR unchanged  THRR unchanged  THRR unchanged     Progression   Progression  Continue to progress workloads to maintain intensity without signs/symptoms of physical distress.  Continue to progress workloads to maintain intensity without signs/symptoms of physical distress.  Continue to progress workloads to maintain intensity without signs/symptoms of physical distress.  Continue to progress workloads to maintain intensity without signs/symptoms of physical distress.  Continue to progress workloads to maintain intensity without signs/symptoms of physical distress.   Average METs  2.4  2.5  2.5  2.4  2.6     Resistance Training   Training Prescription  Yes  Yes  Yes  Yes  Yes   Weight  3lbs  3lbs  3lbs  3lbs  3lbs   Reps  10-15  10-15  10-15  10-15  10-15   Time  10 Minutes  10 Minutes  10  Minutes  10 Minutes  10 Minutes     Interval Training   Interval Training  No  No  No  No  No     NuStep   Level  2  5  5  5  5    SPM  85  85  85  85  85   Minutes  10  10  10  10  10    METs  1.9  1.9  2.3  2.1  2.5     Arm Ergometer   Level  1  2  2   2.3  2.6   Minutes  10  10  10  10  10      Track   Laps  11  12  10  10  10    Minutes  10  10  10  10  10    METs  2.91  2.91  2.74  2.74  2.74     Home Exercise Plan   Plans to continue exercise at  [facility] (comment)  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  Add 1 additional day to program exercise sessions.  Add 1 additional day to program exercise sessions.  Add 1 additional day to program exercise sessions.   Initial Home Exercises Provided  -  01/04/18  01/04/18  01/04/18  01/04/18   Row Name 03/01/18 0954 03/15/18 0952           Response to Exercise   Blood Pressure (Admit)  120/78  102/56      Blood Pressure (Exercise)  110/60  118/64      Blood Pressure (Exit)  118/60  118/72      Heart Rate (Admit)  71 bpm  70 bpm      Heart Rate (Exercise)  92 bpm  88 bpm      Heart Rate (Exit)  70 bpm  68 bpm      Rating of Perceived Exertion (Exercise)  13  11      Symptoms  none  none      Duration  Progress to 30 minutes of  aerobic without signs/symptoms of physical distress  Progress to 30 minutes of  aerobic without signs/symptoms of physical distress      Intensity  THRR unchanged  THRR unchanged        Progression   Progression  Continue to progress workloads to maintain intensity without signs/symptoms of physical distress.  Continue to progress workloads to maintain intensity without signs/symptoms of physical distress.      Average METs  2.7  3        Resistance Training   Training Prescription  Yes  Yes      Weight  3lbs  3lbs      Reps  10-15  10-15      Time  10 Minutes  10 Minutes         Interval Training   Interval Training  No  No        NuStep   Level  5  6      SPM  85  85      Minutes  10  10      METs  2.4  3.4        Arm Ergometer   Level  2.6  2.6      Minutes  10  10        Track   Laps  11  9      Minutes  10  10      METs  2.91  2.57        Home Exercise Plan   Plans to continue exercise at  [facility] (comment[facility]  [facility]      Frequency  Add 1 additional day to program exercise sessions.  Add 1 additional day to program exercise sessions.      Initial Home Exercises Provided  01/04/18  01/04/18         Exercise Comments:  Exercise Comments    Row Name 12/28/17 1052 01/04/18 1018 01/06/18 1030 01/25/18 1017 02/01/18 1028   Exercise Comments  Patient tolerated first session of exercise well without symptoms.  Reviewed home exercise guidelines, METs, and goals with patient.  Reviewed METs and goals with patient.  Reviewed METs with patient.  Reviewed goals with patient.   Clint Name 02/15/18 1022 03/01/18 1045 03/15/18 1022  Exercise Comments  Reviewed METs with patient.  Reviewed METs and goals with patient.  Reviewed METs with patient.        Exercise Goals and Review:  Exercise Goals    Row Name 12/22/17 0950             Exercise Goals   Increase Physical Activity  Yes       Intervention  Provide advice, education, support and counseling about physical activity/exercise needs.;Develop an individualized exercise prescription for aerobic and resistive training based on initial evaluation findings, risk stratification, comorbidities and participant's personal goals.       Expected Outcomes  Short Term: Attend rehab on a regular basis to increase amount of physical activity.;Long Term: Exercising regularly at least 3-5 days a week.;Long Term: Add in home exercise to make exercise part of routine and to increase amount of physical activity.       Increase Strength and Stamina  Yes       Intervention   Provide advice, education, support and counseling about physical activity/exercise needs.;Develop an individualized exercise prescription for aerobic and resistive training based on initial evaluation findings, risk stratification, comorbidities and participant's personal goals.       Expected Outcomes  Short Term: Increase workloads from initial exercise prescription for resistance, speed, and METs.;Short Term: Perform resistance training exercises routinely during rehab and add in resistance training at home;Long Term: Improve cardiorespiratory fitness, muscular endurance and strength as measured by increased METs and functional capacity (6MWT)       Able to understand and use rate of perceived exertion (RPE) scale  Yes       Intervention  Provide education and explanation on how to use RPE scale       Expected Outcomes  Short Term: Able to use RPE daily in rehab to express subjective intensity level;Long Term:  Able to use RPE to guide intensity level when exercising independently       Knowledge and understanding of Target Heart Rate Range (THRR)  Yes       Intervention  Provide education and explanation of THRR including how the numbers were predicted and where they are located for reference       Expected Outcomes  Short Term: Able to state/look up THRR;Long Term: Able to use THRR to govern intensity when exercising independently;Short Term: Able to use daily as guideline for intensity in rehab       Able to check pulse independently  Yes       Intervention  Provide education and demonstration on how to check pulse in carotid and radial arteries.;Review the importance of being able to check your own pulse for safety during independent exercise       Expected Outcomes  Short Term: Able to explain why pulse checking is important during independent exercise;Long Term: Able to check pulse independently and accurately       Understanding of Exercise Prescription  Yes       Intervention  Provide  education, explanation, and written materials on patient's individual exercise prescription       Expected Outcomes  Short Term: Able to explain program exercise prescription;Long Term: Able to explain home exercise prescription to exercise independently          Exercise Goals Re-Evaluation : Exercise Goals Re-Evaluation    Row Name 12/28/17 1052 01/04/18 1018 01/06/18 1030 02/01/18 1028 03/01/18 1045     Exercise Goal Re-Evaluation   Exercise Goals Review  Increase Physical Activity;Able to understand and  use rate of perceived exertion (RPE) scale  Increase Physical Activity;Able to understand and use rate of perceived exertion (RPE) scale;Knowledge and understanding of Target Heart Rate Range (THRR);Understanding of Exercise Prescription  Increase Physical Activity;Able to understand and use rate of perceived exertion (RPE) scale;Knowledge and understanding of Target Heart Rate Range (THRR);Understanding of Exercise Prescription  Increase Physical Activity;Able to understand and use rate of perceived exertion (RPE) scale;Knowledge and understanding of Target Heart Rate Range (THRR);Understanding of Exercise Prescription  Increase Physical Activity;Able to understand and use rate of perceived exertion (RPE) scale;Knowledge and understanding of Target Heart Rate Range (THRR);Understanding of Exercise Prescription;Increase Strength and Stamina   Comments  Patient able to understand and use RPE scale appropriately.  Reviewed home exercise guidelines including THRR, RPE scale, and endpoints for exercise[facility] and will try to walk 1 day/week at home.  [place]   Patient is walking some at home is limited because leg amputation, wearing prosthesis.  Pt isn't doing any organized exercise at home. Pt does feel his strength and stamina have improved a little since exercising in the cardiac rehab program. Discussed increasing workloads at  cardiac rehab and pt is agreeable to this.   Expected Outcomes  Increase workloads as tolerated to help improve cardiorespiratory fitness.  Patient will exercise 30 minutes, 1-2 days/week in addition to exercise at cardiac rehab.  Progress workloads to increase cardiorespiratoy fitness.   Continue to increase workloads at cardiac rehab as tolerated.  Increase workloads to help improve cardiorespiratory fitness.      Discharge Exercise Prescription (Final Exercise Prescription Changes): Exercise Prescription Changes - 03/15/18 0952      Response to Exercise   Blood Pressure (Admit)  102/56    Blood Pressure (Exercise)  118/64    Blood Pressure (Exit)  118/72    Heart Rate (Admit)  70 bpm    Heart Rate (Exercise)  88 bpm    Heart Rate (Exit)  68 bpm    Rating of Perceived Exertion (Exercise)  11    Symptoms  none    Duration  Progress to 30 minutes of  aerobic without signs/symptoms of physical distress    Intensity  THRR unchanged      Progression   Progression  Continue to progress workloads to maintain intensity without signs/symptoms of physical distress.    Average METs  3      Resistance Training   Training Prescription  Yes    Weight  3lbs    Reps  10-15    Time  10 Minutes      Interval Training   Interval Training  No      NuStep   Level  6    SPM  85    Minutes  10    METs  3.4      Arm Ergometer   Level  2.6    Minutes  10      Track   Laps  9    Minutes  10    METs  2.57      Home Exercise Plan   Plans to continue exercise at  [facility] (comment)    Frequency  Add 1 additional day to program exercise sessions.    Initial Home Exercises Provided  01/04/18       Nutrition:  Target Goals: Understanding of nutrition guidelines, daily intake of sodium 1500mg , cholesterol 200mg , calories 30% from fat and  7% or less from saturated fats, daily to have 5 or more servings of fruits and vegetables.  Biometrics: Pre Biometrics - 12/22/17 0841       Pre Biometrics   Height  5\' 8"  (1.727 m)    Weight  150 lb 2.1 oz (68.1 kg)    Waist Circumference  35.5 inches    Hip Circumference  37 inches    Waist to Hip Ratio  0.96 %    BMI (Calculated)  22.83    Triceps Skinfold  9 mm    % Body Fat  22.2 %    Grip Strength  30.5 kg    Flexibility  10 in    Single Leg Stand  10.97 seconds      Post Biometrics - 03/22/18 1020       Post  Biometrics   Waist Circumference  34 inches    Hip Circumference  35.5 inches    Waist to Hip Ratio  0.96 %    Triceps Skinfold  7.5 mm    Grip Strength  29.5 kg    Flexibility  10 in    Single Leg Stand  4.78 seconds       Nutrition Therapy Plan and Nutrition Goals: Nutrition Therapy & Goals - 01/18/18 1020      Nutrition Therapy   Diet  general healthful      Personal Nutrition Goals   Nutrition Goal  pt to identify and limit food sources of sodium, saturated fat, trans fats, and refined carbohydrates    Personal Goal #2  pt to decrease fried food consumption      Intervention Plan   Intervention  Prescribe, educate and counsel regarding individualized specific dietary modifications aiming towards targeted core components such as weight, hypertension, lipid management, diabetes, heart failure and other comorbidities.    Expected Outcomes  Short Term Goal: Understand basic principles of dietary content, such as calories, fat, sodium, cholesterol and nutrients.;Long Term Goal: Adherence to prescribed nutrition plan.       Nutrition Assessments: Nutrition Assessments - 01/18/18 1026      MEDFICTS Scores   Pre Score  40       Nutrition Goals Re-Evaluation: Nutrition Goals Re-Evaluation    Row Name 01/18/18 1020             Goals   Current Weight  150 lb 9.2 oz (68.3 kg)          Nutrition Goals Re-Evaluation: Nutrition Goals Re-Evaluation    [place] Name 01/18/18 1020             Goals   Current Weight  150 lb 9.2 oz (68.3 kg)          Nutrition Goals Discharge (Final  Nutrition Goals Re-Evaluation): Nutrition Goals Re-Evaluation - 01/18/18 1020      Goals   Current Weight  150 lb 9.2 oz (68.3 kg)       Psychosocial: Target Goals: Acknowledge presence or absence of significant depression and/or stress, maximize coping skills, provide positive support system. Participant is able to verbalize types and ability to use techniques and skills needed for reducing stress and depression.  Initial Review & Psychosocial Screening: Initial Psych Review & Screening - 12/22/17 0912      Initial Review   Current issues with  None Identified      Family Dynamics   Good Support System?  Yes   wife, daughters      Barriers   Psychosocial barriers to participate  in program  There are no identifiable barriers or psychosocial needs.      Screening Interventions   Interventions  Encouraged to exercise       Quality of Life Scores: Quality of Life - 12/22/17 0912      Quality of Life   Select  Quality of Life      Quality of Life Scores   Health/Function Pre  26.1 %    Socioeconomic Pre  29.2 %    Psych/Spiritual Pre  29.1 %    Family Pre  30 %    GLOBAL Pre  27.9 %      Scores of 19 and below usually indicate a poorer quality of life in these areas.  A difference of  2-3 points is a clinically meaningful difference.  A difference of 2-3 points in the total score of the Quality of Life Index has been associated with significant improvement in overall quality of life, self-image, physical symptoms, and general health in studies assessing change in quality of life.  PHQ-9: Recent Review Flowsheet Data    Depression screen [facility] 2/9 12/28/2017 11/03/2017 11/03/2017 04/21/2017 10/28/2016   Decreased Interest 0 0 0 1 0   Down, Depressed, Hopeless 0 0 0 1 0   PHQ - 2 Score 0 0 0 2 0   Altered sleeping - - - 2 -   Tired, decreased energy - - - 1 -   Change in appetite - - - 1 -   Feeling bad or failure about yourself  - - - 0 -   Trouble concentrating - - - 1 -    Moving slowly or fidgety/restless - - - 0 -   Suicidal thoughts - - - 0 -   PHQ-9 Score - - - 7 -   Difficult doing work/chores - - - Somewhat difficult -     Interpretation of Total Score  Total Score Depression Severity:  1-4 = Minimal depression, 5-9 = Mild depression, 10-14 = Moderate depression, 15-19 = Moderately severe depression, 20-27 = Severe depression   Psychosocial Evaluation and Intervention:   Psychosocial Re-Evaluation: Psychosocial Re-Evaluation    Row Name 01/14/18 1135 02/03/18 1356 02/24/18 1233 03/23/18 1511       Psychosocial Re-Evaluation   Current issues with  None Identified  None Identified  None Identified  None Identified    Interventions  Encouraged to attend Cardiac Rehabilitation for the exercise  Encouraged to attend Cardiac Rehabilitation for the exercise  Encouraged to attend Cardiac Rehabilitation for the exercise  Encouraged to attend Cardiac Rehabilitation for the exercise    Continue Psychosocial Services   No Follow up required  No Follow up required  No Follow up required  No Follow up required       Psychosocial Discharge (Final Psychosocial Re-Evaluation): Psychosocial Re-Evaluation - 03/23/18 1511      Psychosocial Re-Evaluation   Current issues with  None Identified    Interventions  Encouraged to attend Cardiac Rehabilitation for the exercise    Continue Psychosocial Services   No Follow up required       Vocational Rehabilitation: Provide vocational rehab assistance to qualifying candidates.   Vocational Rehab Evaluation & Intervention: Vocational Rehab - 12/22/17 0913      Initial Vocational Rehab Evaluation & Intervention   Assessment shows need for Vocational Rehabilitation  No   retired Armed forces logistics/support/administrative officer       Education: Education Goals: Education classes will be provided on a weekly basis, covering required topics.  Participant will state understanding/return demonstration of topics presented.  Learning  Barriers/Preferences: Learning Barriers/Preferences - 12/22/17 1154      Learning Barriers/Preferences   Learning Barriers  Hearing;Sight   Bilateral hearing aides, reading glasses   Learning Preferences  Skilled Demonstration       Education Topics: Count Your Pulse:  -Group instruction provided by verbal instruction, demonstration, patient participation and written materials to support subject.  Instructors address importance of being able to find your pulse and how to count your pulse when at home without a heart monitor.  Patients get hands on experience counting their pulse with staff help and individually.   CARDIAC REHAB PHASE II EXERCISE from 03/03/2018 in [place]  Date  01/08/18  Instruction Review Code  2- Demonstrated Understanding      Heart Attack, Angina, and Risk Factor Modification:  -Group instruction provided by verbal instruction, video, and written materials to support subject.  Instructors address signs and symptoms of angina and heart attacks.    Also discuss risk factors for heart disease and how to make changes to improve heart health risk factors.   CARDIAC REHAB PHASE II EXERCISE from 03/03/2018 in [place]  Date  02/24/18  Educator  RN  Instruction Review Code  2- Demonstrated Understanding      Functional Fitness:  -Group instruction provided by verbal instruction, demonstration, patient participation, and written materials to support subject.  Instructors address safety measures for doing things around the house.  Discuss how to get up and down off the floor, how to pick things up properly, how to safely get out of a chair without assistance, and balance training.   Meditation and Mindfulness:  -Group instruction provided by verbal instruction, patient participation, and written materials to support subject.  Instructor addresses importance of mindfulness and meditation practice to help  reduce stress and improve awareness.  Instructor also leads participants through a meditation exercise.    Stretching for Flexibility and Mobility:  -Group instruction provided by verbal instruction, patient participation, and written materials to support subject.  Instructors lead participants through series of stretches that are designed to increase flexibility thus improving mobility.  These stretches are additional exercise for major muscle groups that are typically performed during regular warm up and cool down.   Hands Only CPR:  -Group verbal, video, and participation provides a basic overview of AHA guidelines for community CPR. Role-play of emergencies allow participants the opportunity to practice calling for help and chest compression technique with discussion of AED use.   Hypertension: -Group verbal and written instruction that provides a basic overview of hypertension including the most recent diagnostic guidelines, risk factor reduction with self-care instructions and medication management.    Nutrition I class: Heart Healthy Eating:  -Group instruction provided by PowerPoint slides, verbal discussion, and written materials to support subject matter. The instructor gives an explanation and review of the Therapeutic Lifestyle Changes diet recommendations, which includes a discussion on lipid goals, dietary fat, sodium, fiber, plant stanol/sterol esters, sugar, and the components of a well-balanced, healthy diet.   Nutrition II class: Lifestyle Skills:  -Group instruction provided by PowerPoint slides, verbal discussion, and written materials to support subject matter. The instructor gives an explanation and review of label reading, grocery shopping for heart health, heart healthy recipe modifications, and ways to make healthier choices when eating out.   Diabetes Question & Answer:  -Group instruction provided by PowerPoint slides, verbal discussion, and written  materials to  support subject matter. The instructor gives an explanation and review of diabetes co-morbidities, pre- and post-prandial blood glucose goals, pre-exercise blood glucose goals, signs, symptoms, and treatment of hypoglycemia and hyperglycemia, and foot care basics.   Diabetes Blitz:  -Group instruction provided by PowerPoint slides, verbal discussion, and written materials to support subject matter. The instructor gives an explanation and review of the physiology behind type 1 and type 2 diabetes, diabetes medications and rational behind using different medications, pre- and post-prandial blood glucose recommendations and Hemoglobin A1c goals, diabetes diet, and exercise including blood glucose guidelines for exercising safely.    Portion Distortion:  -Group instruction provided by PowerPoint slides, verbal discussion, written materials, and food models to support subject matter. The instructor gives an explanation of serving size versus portion size, changes in portions sizes over the last 20 years, and what consists of a serving from each food group.   Stress Management:  -Group instruction provided by verbal instruction, video, and written materials to support subject matter.  Instructors review role of stress in heart disease and how to cope with stress positively.     Exercising on Your Own:  -Group instruction provided by verbal instruction, power point, and written materials to support subject.  Instructors discuss benefits of exercise, components of exercise, frequency and intensity of exercise, and end points for exercise.  Also discuss use of nitroglycerin and activating EMS.  Review options of places to exercise outside of rehab.  Review guidelines for sex with heart disease.   CARDIAC REHAB PHASE II EXERCISE from 03/03/2018 in [place]  Date  03/03/18  Educator  EP  Instruction Review Code  2- Demonstrated Understanding      Cardiac Drugs I:   -Group instruction provided by verbal instruction and written materials to support subject.  Instructor reviews cardiac drug classes: antiplatelets, anticoagulants, beta blockers, and statins.  Instructor discusses reasons, side effects, and lifestyle considerations for each drug class.   CARDIAC REHAB PHASE II EXERCISE from 03/03/2018 in [place]  Date  01/27/18  Educator  Pharmacist  Instruction Review Code  2- Demonstrated Understanding      Cardiac Drugs II:  -Group instruction provided by verbal instruction and written materials to support subject.  Instructor reviews cardiac drug classes: angiotensin converting enzyme inhibitors (ACE-I), angiotensin II receptor blockers (ARBs), nitrates, and calcium channel blockers.  Instructor discusses reasons, side effects, and lifestyle considerations for each drug class.   Anatomy and Physiology of the Circulatory System:  Group verbal and written instruction and models provide basic cardiac anatomy and physiology, with the coronary electrical and arterial systems. Review of: AMI, Angina, Valve disease, Heart Failure, Peripheral Artery Disease, Cardiac Arrhythmia, Pacemakers, and the ICD.   Other Education:  -Group or individual verbal, written, or video instructions that support the educational goals of the cardiac rehab program.   Holiday Eating Survival Tips:  -Group instruction provided by PowerPoint slides, verbal discussion, and written materials to support subject matter. The instructor gives patients tips, tricks, and techniques to help them not only survive but enjoy the holidays despite the onslaught of food that accompanies the holidays.   Knowledge Questionnaire Score: Knowledge Questionnaire Score - 12/22/17 0914      Knowledge Questionnaire Score   Pre Score  13/24       Core Components/Risk Factors/Patient Goals at Admission: Personal Goals and Risk Factors at Admission - 12/22/17 1159       Core  Components/Risk Factors/Patient Goals on Admission    Weight Management  Weight Gain;Yes    Intervention  Weight Management: Develop a combined nutrition and exercise program designed to reach desired caloric intake, while maintaining appropriate intake of nutrient and fiber, sodium and fats, and appropriate energy expenditure required for the weight goal.;Weight Management: Provide education and appropriate resources to help participant work on and attain dietary goals.    Admit Weight  150 lb 2.1 oz (68.1 kg)    Expected Outcomes  Weight Gain: Understanding of general recommendations for a high calorie, high protein meal plan that promotes weight gain by distributing calorie intake throughout the day with the consumption for 4-5 meals, snacks, and/or supplements;Short Term: Continue to assess and modify interventions until short term weight is achieved;Long Term: Adherence to nutrition and physical activity/exercise program aimed toward attainment of established weight goal    Heart Failure  Yes    Intervention  Provide a combined exercise and nutrition program that is supplemented with education, support and counseling about heart failure. Directed toward relieving symptoms such as shortness of breath, decreased exercise tolerance, and extremity edema.    Expected Outcomes  Improve functional capacity of life;Short term: Attendance in program 2-3 days a week with increased exercise capacity. Reported lower sodium intake. Reported increased fruit and vegetable intake. Reports medication compliance.;Short term: Daily weights obtained and reported for increase. Utilizing diuretic protocols set by physician.;Long term: Adoption of self-care skills and reduction of barriers for early signs and symptoms recognition and intervention leading to self-care maintenance.    Lipids  Yes    Intervention  Provide education and support for participant on nutrition & aerobic/resistive exercise along with  prescribed medications to achieve LDL 70mg , HDL >40mg .    Expected Outcomes  Short Term: Participant states understanding of desired cholesterol values and is compliant with medications prescribed. Participant is following exercise prescription and nutrition guidelines.;Long Term: Cholesterol controlled with medications as prescribed, with individualized exercise RX and with personalized nutrition plan. Value goals: LDL < 70mg , HDL > 40 mg.    Personal Goal Other  Yes    Personal Goal  Participant would like to be able to walk with less SOB.    Intervention  Provide aerobic exercise program to help improve exercise tolerance, energy, and stamina and decrease breathlessness with walking.    Expected Outcomes  Patient will increase cardiorespiratory fitness through regular exercise routine and be less SOB with walking.       Core Components/Risk Factors/Patient Goals Review:  Goals and Risk Factor Review    Row Name 01/14/18 1136 02/03/18 1356 02/24/18 1235 03/23/18 1511       Core Components/Risk Factors/Patient Goals Review   Personal Goals Review  Weight Management/Obesity;Lipids;Heart Failure;Hypertension  Weight Management/Obesity;Lipids;Heart Failure;Hypertension  Weight Management/Obesity;Lipids;Heart Failure;Hypertension  Weight Management/Obesity;Lipids;Heart Failure;Hypertension    Review  Eugene [name] has maintianed his current weight. Jimm's vital signs have been stable at phase 2 cardiac rehab.Eugene [name] is doing well with exercise  Eugene [name] has maintianed his current weight. Jimm's vital signs have been stable at phase 2 cardiac rehab.Eugene [name] is doing well with exercise  Eugene [name] has maintianed his current weight. Jimm's vital signs have been stable at phase 2 cardiac rehab.Eugene [name] is doing well with exercise  Eugene [name] has maintianed his current weight. Jimm's vital signs have been stable at phase 2 cardiac rehab.Eugene [name] is doing well with exercise    Expected Outcomes  Eugene [name] will continue to participate  in phase 2 cardiac rehab, follow nutrition  and lifestyle modfication opportunties.  Eugene [name] will continue to participate in phase 2 cardiac rehab, follow nutrition and lifestyle modfication opportunties.  Eugene [name] will continue to participate in phase 2 cardiac rehab, follow nutrition and lifestyle modfication opportunties.  Eugene [name] will continue to participate in phase 2 cardiac rehab, follow nutrition and lifestyle modfication opportunties. Eugene [name] will complete cardiac rehab next week       Core Components/Risk Factors/Patient Goals at Discharge (Final Review):  Goals and Risk Factor Review - 03/23/18 1511      Core Components/Risk Factors/Patient Goals Review   Personal Goals Review  Weight Management/Obesity;Lipids;Heart Failure;Hypertension    Review  Eugene [name] has maintianed his current weight. Jimm's vital signs have been stable at phase 2 cardiac rehab.Eugene [name] is doing well with exercise    Expected Outcomes  Eugene [name] will continue to participate in phase 2 cardiac rehab, follow nutrition and lifestyle modfication opportunties. Eugene [name] will complete cardiac rehab next week       ITP Comments: ITP Comments    Row Name 12/22/17 0856 01/14/18 1134 02/03/18 1403 02/24/18 1233 03/23/18 1511   ITP Comments  Dr. Fransico Him, Medical Director   30 Day ITP Review. Eugene [name] is with good partcipation and attendance in phase 2 cardiac rehab.  30 Day ITP Review. Eugene [name] is with good partcipation and attendance in phase 2 cardiac rehab.  30 Day ITP Review. Eugene [name] is with good partcipation and attendance in phase 2 cardiac rehab.  30 Day ITP Review. Eugene [name] is with good partcipation and attendance in phase 2 cardiac rehab.      Comments: See ITP comments.Barnet Pall, RN,BSN 03/25/2018 11:09 AM

## 2018-03-24 ENCOUNTER — Encounter (HOSPITAL_COMMUNITY): Payer: PPO

## 2018-03-24 ENCOUNTER — Encounter (HOSPITAL_COMMUNITY)
Admission: RE | Admit: 2018-03-24 | Discharge: 2018-03-24 | Disposition: A | Discharge status: Home / Self Care | Payer: PPO | Source: Ambulatory Visit | Attending: Cardiovascular Disease | Admitting: Cardiovascular Disease

## 2018-03-24 DIAGNOSIS — Z955 Presence of coronary angioplasty implant and graft: Secondary | ICD-10-CM

## 2018-03-24 DIAGNOSIS — I213 ST elevation (STEMI) myocardial infarction of unspecified site: Secondary | ICD-10-CM | POA: Diagnosis not present

## 2018-03-26 ENCOUNTER — Encounter (HOSPITAL_COMMUNITY)
Admission: RE | Admit: 2018-03-26 | Discharge: 2018-03-26 | Disposition: A | Discharge status: Home / Self Care | Payer: PPO | Source: Ambulatory Visit | Attending: Cardiovascular Disease | Admitting: Cardiovascular Disease

## 2018-03-26 ENCOUNTER — Encounter (HOSPITAL_COMMUNITY): Payer: PPO

## 2018-03-26 DIAGNOSIS — Z955 Presence of coronary angioplasty implant and graft: Secondary | ICD-10-CM

## 2018-03-26 DIAGNOSIS — I213 ST elevation (STEMI) myocardial infarction of unspecified site: Secondary | ICD-10-CM

## 2018-03-29 ENCOUNTER — Encounter (HOSPITAL_COMMUNITY): Payer: PPO

## 2018-03-29 ENCOUNTER — Encounter (HOSPITAL_COMMUNITY)
Admission: RE | Admit: 2018-03-29 | Discharge: 2018-03-29 | Disposition: A | Discharge status: Home / Self Care | Payer: PPO | Source: Ambulatory Visit | Attending: Cardiovascular Disease | Admitting: Cardiovascular Disease

## 2018-03-29 VITALS — BP 104/66 | HR 72 | Ht 68.0 in | Wt 147.7 lb

## 2018-03-29 DIAGNOSIS — I213 ST elevation (STEMI) myocardial infarction of unspecified site: Secondary | ICD-10-CM | POA: Diagnosis not present

## 2018-03-29 DIAGNOSIS — Z955 Presence of coronary angioplasty implant and graft: Secondary | ICD-10-CM

## 2018-03-29 NOTE — Progress Notes (Signed)
Discharge Progress Report  Patient Details  Name: Eugene [name] MRN: [id number] Date of Birth: 1931/06/18 Referring Provider:     [place] from 12/22/2017 in [place]  Referring Provider  Lauree Chandler, MD       Number of Visits: 36  Reason for Discharge:  Patient independent in their exercise. Patient has met program and personal goals.  Smoking History:  Social History   Tobacco Use  Smoking Status Former Smoker  . Packs/day: 1.00  . Years: 8.00  . Pack years: 8.00  . Types: Cigarettes  Smokeless Tobacco Never Used    Diagnosis:  10/07/2017 Stented coronary artery, S/P DES LAD  10/07/2017 ST elevation myocardial infarction (STEMI), unspecified artery ([place])  ADL [facility]:   Initial Exercise Prescription: Initial Exercise Prescription - 12/22/17 1000      Date of Initial Exercise RX and Referring Provider   Date  12/22/17    Referring Provider  Lauree Chandler, MD    Expected Discharge Date  03/29/18      NuStep   Level  2    SPM  85    Minutes  10    METs  2.5      Arm Ergometer   Level  1    Minutes  10    METs  2      Track   Laps  9    Minutes  10    METs  2.56      Prescription Details   Frequency (times per week)  3    Duration  Progress to 30 minutes of continuous aerobic without signs/symptoms of physical distress      Intensity   THRR 40-80% of Max Heartrate  54-109    Ratings of Perceived Exertion  11-13    Perceived Dyspnea  0-4      Progression   Progression  Continue to progress workloads to maintain intensity without signs/symptoms of physical distress.      Resistance Training   Training Prescription  Yes    Weight  3lbs    Reps  10-15       Discharge Exercise Prescription (Final Exercise Prescription Changes): Exercise Prescription Changes - 03/29/18 0950      Response to Exercise   Blood Pressure (Admit)  104/66    Blood Pressure (Exercise)  142/58     Blood Pressure (Exit)  118/64    Heart Rate (Admit)  72 bpm    Heart Rate (Exercise)  90 bpm    Heart Rate (Exit)  64 bpm    Rating of Perceived Exertion (Exercise)  11    Symptoms  none    Duration  Progress to 30 minutes of  aerobic without signs/symptoms of physical distress    Intensity  THRR unchanged      Progression   Progression  Continue to progress workloads to maintain intensity without signs/symptoms of physical distress.    Average METs  2.5      Resistance Training   Training Prescription  Yes    Weight  3lbs    Reps  10-15    Time  10 Minutes      Interval Training   Interval Training  No      NuStep   Level  6    SPM  85    Minutes  10    METs  2.4      Arm Ergometer   Level  4.5  Minutes  10    METs  2.4      Track   Laps  10    Minutes  10    METs  2.74      Home Exercise Plan   Plans to continue exercise at  [facility] (comment)    Frequency  Add 1 additional day to program exercise sessions.    Initial Home Exercises Provided  01/04/18       Functional Capacity: 6 Minute Walk    Row Name 12/22/17 0856 03/22/18 1013       6 Minute Walk   Phase  Initial  Discharge    Distance  1333 feet  1641 feet    Distance % Change  -  23.11 %    Walk Time  6 minutes  6 minutes    # of Rest Breaks  0  1    MPH  2.52  3.11    METS  2.4  2.9    RPE  12  10    Perceived Dyspnea   0  0    VO2 Peak  8.39  10.13    Symptoms  No  No    Resting HR  67 bpm  81 bpm    Resting BP  118/68  108/60    Resting Oxygen Saturation   98 %  -    Exercise Oxygen Saturation  during 6 min walk  99 %  -    Max Ex. HR  97 bpm  107 bpm    Max Ex. BP  122/68  108/72    2 Minute Post BP  108/78  106/62       Psychological, QOL, Others - Outcomes: PHQ 2/9: Depression screen [facility] 2/9 03/29/2018 12/28/2017 11/03/2017 11/03/2017 04/21/2017  Decreased Interest 0 0 0 0 1  Down, Depressed, Hopeless 0 0 0 0 1  PHQ - 2 Score 0 0 0 0 2  Altered sleeping - - - - 2   Tired, decreased energy - - - - 1  Change in appetite - - - - 1  Feeling bad or failure about yourself  - - - - 0  Trouble concentrating - - - - 1  Moving slowly or fidgety/restless - - - - 0  Suicidal thoughts - - - - 0  PHQ-9 Score - - - - 7  Difficult doing work/chores - - - - Somewhat difficult    Quality of Life: Quality of Life - 03/29/18 1053      Quality of Life   Select  --   Patient did not complete post QOL     Quality of Life Scores   Health/Function Pre  26.1 %    Socioeconomic Pre  29.2 %    Psych/Spiritual Pre  29.1 %    Family Pre  30 %    GLOBAL Pre  27.9 %       Personal Goals: Goals established at orientation with interventions provided to work toward goal. Personal Goals and Risk Factors at Admission - 12/22/17 1159      Core Components/Risk Factors/Patient Goals on Admission    Weight Management  Weight Gain;Yes    Intervention  Weight Management: Develop a combined nutrition and exercise program designed to reach desired caloric intake, while maintaining appropriate intake of nutrient and fiber, sodium and fats, and appropriate energy expenditure required for the weight goal.;Weight Management: Provide education and appropriate resources to help participant work on and attain dietary goals.  Admit Weight  150 lb 2.1 oz (68.1 kg)    Expected Outcomes  Weight Gain: Understanding of general recommendations for a high calorie, high protein meal plan that promotes weight gain by distributing calorie intake throughout the day with the consumption for 4-5 meals, snacks, and/or supplements;Short Term: Continue to assess and modify interventions until short term weight is achieved;Long Term: Adherence to nutrition and physical activity/exercise program aimed toward attainment of established weight goal    Heart Failure  Yes    Intervention  Provide a combined exercise and nutrition program that is supplemented with education, support and counseling about heart  failure. Directed toward relieving symptoms such as shortness of breath, decreased exercise tolerance, and extremity edema.    Expected Outcomes  Improve functional capacity of life;Short term: Attendance in program 2-3 days a week with increased exercise capacity. Reported lower sodium intake. Reported increased fruit and vegetable intake. Reports medication compliance.;Short term: Daily weights obtained and reported for increase. Utilizing diuretic protocols set by physician.;Long term: Adoption of self-care skills and reduction of barriers for early signs and symptoms recognition and intervention leading to self-care maintenance.    Lipids  Yes    Intervention  Provide education and support for participant on nutrition & aerobic/resistive exercise along with prescribed medications to achieve LDL <29m, HDL >421m    Expected Outcomes  Short Term: Participant states understanding of desired cholesterol values and is compliant with medications prescribed. Participant is following exercise prescription and nutrition guidelines.;Long Term: Cholesterol controlled with medications as prescribed, with individualized exercise RX and with personalized nutrition plan. Value goals: LDL < 7021mHDL > 40 mg.    Personal Goal Other  Yes    Personal Goal  Participant would like to be able to walk with less SOB.    Intervention  Provide aerobic exercise program to help improve exercise tolerance, energy, and stamina and decrease breathlessness with walking.    Expected Outcomes  Patient will increase cardiorespiratory fitness through regular exercise routine and be less SOB with walking.        Personal Goals Discharge: Goals and Risk Factor Review    Row Name 01/14/18 1136 02/03/18 1356 02/24/18 1235 03/23/18 1511 03/29/18 1156     Core Components/Risk Factors/Patient Goals Review   Personal Goals Review  Weight Management/Obesity;Lipids;Heart Failure;Hypertension  Weight Management/Obesity;Lipids;Heart  Failure;Hypertension  Weight Management/Obesity;Lipids;Heart Failure;Hypertension  Weight Management/Obesity;Lipids;Heart Failure;Hypertension  Weight Management/Obesity;Lipids;Heart Failure;Hypertension   Review  JimLaverna Peaces maintianed his current weight. Jimm's vital signs have been stable at phase 2 cardiac rehab.. JLaverna Peace doing well with exercise  JimLaverna Peaces maintianed his current weight. Jimm's vital signs have been stable at phase 2 cardiac rehab.. JLaverna Peace doing well with exercise  JimLaverna Peaces maintianed his current weight. Jimm's vital signs have been stable at phase 2 cardiac rehab.. JLaverna Peace doing well with exercise  JimLaverna Peaces maintianed his current weight. Jimm's vital signs have been stable at phase 2 cardiac rehab.. JLaverna Peace doing well with exercise  JimLaverna Peaces maintianed his current weight. Jimm's vital signs have been stable at phase 2 cardiac rehab.. JLaverna Peace doing well with exercise   Expected Outcomes  JimLaverna Peacell continue to participate in phase 2 cardiac rehab, follow nutrition and lifestyle modfication opportunties.  JimLaverna Peacell continue to participate in phase 2 cardiac rehab, follow nutrition and lifestyle modfication opportunties.  JimLaverna Peacell continue to participate in phase 2 cardiac rehab, follow nutrition and lifestyle modfication opportunties.  JimLaverna Peacell continue to participate in phase 2  cardiac rehab, follow nutrition and lifestyle modfication opportunties. Laverna Peace will complete cardiac rehab next week  Laverna Peace will continue to walk on his own and may consider exercising at the [facility], follow nutrition and lifestyle modfication opportunties. Jimmy graduates from cardiac rehab today.      Exercise Goals and Review: Exercise Goals    Row Name 12/22/17 0950             Exercise Goals   Increase Physical Activity  Yes       Intervention  Provide advice, education, support and counseling about physical activity/exercise needs.;Develop an individualized exercise prescription  for aerobic and resistive training based on initial evaluation findings, risk stratification, comorbidities and participant's personal goals.       Expected Outcomes  Short Term: Attend rehab on a regular basis to increase amount of physical activity.;Long Term: Exercising regularly at least 3-5 days a week.;Long Term: Add in home exercise to make exercise part of routine and to increase amount of physical activity.       Increase Strength and Stamina  Yes       Intervention  Provide advice, education, support and counseling about physical activity/exercise needs.;Develop an individualized exercise prescription for aerobic and resistive training based on initial evaluation findings, risk stratification, comorbidities and participant's personal goals.       Expected Outcomes  Short Term: Increase workloads from initial exercise prescription for resistance, speed, and METs.;Short Term: Perform resistance training exercises routinely during rehab and add in resistance training at home;Long Term: Improve cardiorespiratory fitness, muscular endurance and strength as measured by increased METs and functional capacity (6MWT)       Able to understand and use rate of perceived exertion (RPE) scale  Yes       Intervention  Provide education and explanation on how to use RPE scale       Expected Outcomes  Short Term: Able to use RPE daily in rehab to express subjective intensity level;Long Term:  Able to use RPE to guide intensity level when exercising independently       Knowledge and understanding of Target Heart Rate Range (THRR)  Yes       Intervention  Provide education and explanation of THRR including how the numbers were predicted and where they are located for reference       Expected Outcomes  Short Term: Able to state/look up THRR;Long Term: Able to use THRR to govern intensity when exercising independently;Short Term: Able to use daily as guideline for intensity in rehab       Able to check pulse  independently  Yes       Intervention  Provide education and demonstration on how to check pulse in carotid and radial arteries.;Review the importance of being able to check your own pulse for safety during independent exercise       Expected Outcomes  Short Term: Able to explain why pulse checking is important during independent exercise;Long Term: Able to check pulse independently and accurately       Understanding of Exercise Prescription  Yes       Intervention  Provide education, explanation, and written materials on patient's individual exercise prescription       Expected Outcomes  Short Term: Able to explain program exercise prescription;Long Term: Able to explain home exercise prescription to exercise independently          Exercise Goals Re-Evaluation: Exercise Goals Re-Evaluation    Row Name 12/28/17 1052 01/04/18 1018 01/06/18 1030 02/01/18 1028  03/01/18 1045     Exercise Goal Re-Evaluation   Exercise Goals Review  Increase Physical Activity;Able to understand and use rate of perceived exertion (RPE) scale  Increase Physical Activity;Able to understand and use rate of perceived exertion (RPE) scale;Knowledge and understanding of Target Heart Rate Range (THRR);Understanding of Exercise Prescription  Increase Physical Activity;Able to understand and use rate of perceived exertion (RPE) scale;Knowledge and understanding of Target Heart Rate Range (THRR);Understanding of Exercise Prescription  Increase Physical Activity;Able to understand and use rate of perceived exertion (RPE) scale;Knowledge and understanding of Target Heart Rate Range (THRR);Understanding of Exercise Prescription  Increase Physical Activity;Able to understand and use rate of perceived exertion (RPE) scale;Knowledge and understanding of Target Heart Rate Range (THRR);Understanding of Exercise Prescription;Increase Strength and Stamina   Comments  Patient able to understand and use RPE scale appropriately.  Reviewed home  exercise guidelines including THRR, RPE scale, and endpoints for exercise. [facility] and will try to walk 1 day/week at home.  [place]   Patient is walking some at home is limited because leg amputation, wearing prosthesis.  Pt isn't doing any organized exercise at home. Pt does feel his strength and stamina have improved a little since exercising in the cardiac rehab program. Discussed increasing workloads at cardiac rehab and pt is agreeable to this.   Expected Outcomes  Increase workloads as tolerated to help improve cardiorespiratory fitness.  Patient will exercise 30 minutes, 1-2 days/week in addition to exercise at cardiac rehab.  Progress workloads to increase cardiorespiratoy fitness.   Continue to increase workloads at cardiac rehab as tolerated.  Increase workloads to help improve cardiorespiratory fitness.   [place] Name 03/29/18 1053             Exercise Goal Re-Evaluation   Exercise Goals Review  Increase Physical Activity;Able to understand and use rate of perceived exertion (RPE) scale;Knowledge and understanding of Target Heart Rate Range (THRR);Understanding of Exercise Prescription;Increase Strength and Stamina       Comments  Patient completed the phase 2 cardiac rehab program and [facility] and [facility] [place] program.       Expected Outcomes  Patient will exercise 2-3 days/week to help achieve health and fitness goals.          Nutrition & Weight - Outcomes: Pre Biometrics - 12/22/17 0841      Pre Biometrics   Height  5' 8"  (1.727 m)    Weight  150 lb 2.1 oz (68.1 kg)    Waist Circumference  35.5 inches    Hip Circumference  37 inches    Waist to Hip Ratio  0.96 %    BMI (Calculated)  22.83    Triceps Skinfold  9 mm    % Body Fat  22.2 %    Grip Strength  30.5 kg    Flexibility  10 in    Single Leg Stand  10.97 seconds      Post Biometrics -  03/29/18 0950       Post  Biometrics   Height  5' 8"  (1.727 m)    Weight  147 lb 11.3 oz (67 kg)    Waist Circumference  34 inches    Hip Circumference  35.5 inches    Waist to Hip Ratio  0.96 %    BMI (Calculated)  22.46    Triceps Skinfold  7.5  mm    % Body Fat  20.7 %    Grip Strength  29.5 kg    Flexibility  10 in    Single Leg Stand  4.78 seconds       Nutrition: Nutrition Therapy & Goals - 04/08/18 1642      Nutrition Therapy   Diet  general healthful      Personal Nutrition Goals   Nutrition Goal  pt to identify and limit food sources of sodium, saturated fat, trans fats, and refined carbohydrates    Personal Goal #2  pt to decrease fried food consumption      Intervention Plan   Intervention  Prescribe, educate and counsel regarding individualized specific dietary modifications aiming towards targeted core components such as weight, hypertension, lipid management, diabetes, heart failure and other comorbidities.    Expected Outcomes  Short Term Goal: Understand basic principles of dietary content, such as calories, fat, sodium, cholesterol and nutrients.;Long Term Goal: Adherence to prescribed nutrition plan.       Nutrition Discharge: Nutrition Assessments - 04/08/18 1644      MEDFICTS Scores   Pre Score  40    Post Score  40    Score Difference  0       Education Questionnaire Score: Knowledge Questionnaire Score - 03/29/18 0950      Knowledge Questionnaire Score   Pre Score  13/24    Post Score  19/24       Goals reviewed with patient; copy given to patient.Jimmy graduated from cardiac rehab program today with completion of 36 exercise sessions in Phase II. Pt maintained good attendance and progressed nicely during his participation in rehab as evidenced by increased MET level.   Medication list reconciled. Repeat  PHQ score- 0 .  Pt has made significant lifestyle changes and should be commended for his success. Pt feels he has achieved his goals during  cardiac rehab.   Pt plans to continue exercise by walking on his own at home and may consider joining the [facility].  Jimmy increased his distance on his post exercise walk test and lost 3 pounds.. We are proud of Jimmy's progress.Barnet Pall, RN,BSN 04/16/2018 9:32 AM

## 2018-03-31 ENCOUNTER — Encounter (HOSPITAL_COMMUNITY): Payer: PPO

## 2018-04-01 DIAGNOSIS — H353132 Nonexudative age-related macular degeneration, bilateral, intermediate dry stage: Secondary | ICD-10-CM | POA: Diagnosis not present

## 2018-04-01 DIAGNOSIS — H35033 Hypertensive retinopathy, bilateral: Secondary | ICD-10-CM | POA: Diagnosis not present

## 2018-04-01 DIAGNOSIS — H35371 Puckering of macula, right eye: Secondary | ICD-10-CM | POA: Diagnosis not present

## 2018-04-01 DIAGNOSIS — H35412 Lattice degeneration of retina, left eye: Secondary | ICD-10-CM | POA: Diagnosis not present

## 2018-04-15 DIAGNOSIS — L570 Actinic keratosis: Secondary | ICD-10-CM | POA: Diagnosis not present

## 2018-04-15 DIAGNOSIS — D0439 Carcinoma in situ of skin of other parts of face: Secondary | ICD-10-CM | POA: Diagnosis not present

## 2018-06-10 ENCOUNTER — Ambulatory Visit (INDEPENDENT_AMBULATORY_CARE_PROVIDER_SITE_OTHER): Payer: PPO | Admitting: Family Medicine

## 2018-06-10 ENCOUNTER — Ambulatory Visit
Admission: RE | Admit: 2018-06-10 | Discharge: 2018-06-10 | Disposition: A | Discharge status: Home / Self Care | Payer: PPO | Source: Ambulatory Visit | Attending: Family Medicine | Admitting: Family Medicine

## 2018-06-10 ENCOUNTER — Encounter: Payer: Self-pay | Admitting: Family Medicine

## 2018-06-10 ENCOUNTER — Other Ambulatory Visit: Payer: Self-pay

## 2018-06-10 VITALS — BP 132/70 | HR 88 | Temp 97.6°F | Resp 18 | Ht 70.0 in | Wt 149.8 lb

## 2018-06-10 DIAGNOSIS — S8012XA Contusion of left lower leg, initial encounter: Secondary | ICD-10-CM | POA: Diagnosis not present

## 2018-06-10 MED ORDER — TRAMADOL HCL 50 MG PO TABS: 50.0000 mg | ORAL_TABLET | Freq: Three times a day (TID) | ORAL | 0 refills | Status: DC | PRN

## 2018-06-10 NOTE — Progress Notes (Signed)
Patient ID: Eugene [name], male    DOB: 03-15-1932, 85 y.o.   MRN: [id number]  PCP: Susy Frizzle, MD  Chief Complaint  Patient presents with  . Ankle Pain    left ankle, tracker pipe swung and hit pt in leg, happen on 03/20, no meds were taken    Subjective:   Eugene [name] is a 85 y.o. male, presents to clinic with CC of left lower leg pain, injury, bruising and swelling, onset 6 days ago. Contusion injury one week ago to left lower outer leg.  Pt has associated swelling from mid shin extending down through ankle to foot and toes.  Same area of associated bruising.  He says "I can move everything, so I didn't think anything was broken."  He is s/p right BKA and left TKR.  He comes in today because it is hurting more at night, waking him up.  When he gets up at night he uses walked and bears weight only on left leg, this is very painful.  He only applied ice to it last night with worsening pain, has not otherwise rested, elevated, applied ice or wrapped with anything.  There is very small area of abrasion/scab round and roughly 8 mm wide.  He has no redness, warmth and worsening swelling ascending up leg.  He is on blood thinner and ASA.  He is worried about blood clots.  He denies any pain to left knee, ankle, foot or toes.    Leg Pain   The incident occurred 5 to 7 days ago. The incident occurred at home (working in his yard and pipe accidentally hit his left outer leg just below mid shin and above ankle). The injury mechanism was a direct blow. The pain is present in the left leg. Quality: "just hurts" The pain is moderate. The pain has been worsening (intermittent, worse at night, waking him up, gradually worsening) since onset. Pertinent negatives include no inability to bear weight, loss of motion, loss of sensation, muscle weakness, numbness or tingling. He reports no foreign bodies present. The symptoms are aggravated by weight bearing and palpation. He has tried NSAIDs and ice for  the symptoms. The treatment provided no relief.      Patient Active Problem List   Diagnosis Date Noted  . CAD (coronary artery disease) 10/10/2017  . Ischemic cardiomyopathy   . Chronic combined systolic and diastolic heart failure ([place])   . Hyperlipidemia LDL goal <70   . Acute ST elevation myocardial infarction (STEMI) involving left anterior descending (LAD) coronary artery ([place]) 10/07/2017  . Collagenous colitis   . History of right below knee amputation ([place]) 10/13/2013  . Sinus tachycardia 06/04/2012  . Anxiety state, unspecified 06/04/2012     Prior to Admission medications   Medication Sig Start Date End Date Taking? Authorizing Provider  aspirin 81 MG tablet Take 81 mg by mouth at bedtime.   Yes [provider]  atorvastatin (LIPITOR) 80 MG tablet Take 1 tablet (80 mg total) by mouth daily at 6 PM. 11/02/17  Yes Dunn, Dayna N, PA-C  carvedilol (COREG) 3.125 MG tablet Take 1 tablet (3.125 mg total) by mouth 2 (two) times daily with a meal. 11/02/17  Yes Dunn, Dayna N, PA-C  Cetirizine HCl (ZYRTEC ALLERGY) 10 MG TBDP Take 10 mg by mouth at bedtime. 02/09/18  Yes Delsa Grana, PA-C  fluticasone (FLONASE) 50 MCG/ACT nasal spray Place 2 sprays into both nostrils daily. 02/09/18  Yes Delsa Grana, PA-C  losartan (COZAAR) 25 MG tablet Take 0.5 tablets (12.5 mg total) by mouth daily. 11/02/17  Yes Dunn, Dayna N, PA-C  multivitamin-lutein (OCUVITE-LUTEIN) CAPS capsule Take 1 capsule by mouth daily. Patient says he is taking twice a day   Yes [provider]  nitroGLYCERIN (NITROSTAT) 0.4 MG SL tablet Place 1 tablet (0.4 mg total) under the tongue every 5 (five) minutes x 3 doses as needed for chest pain. 10/10/17  Yes Duke, Tami Lin, PA  ticagrelor (BRILINTA) 90 MG TABS tablet Take 1 tablet (90 mg total) by mouth 2 (two) times daily. 11/02/17  Yes Dunn, Nedra Hai, PA-C  cholestyramine Lucrezia Starch) 4 GM/DOSE powder USE 1 SCOOP 3 TIMES A DAY WITH A MEAL Patient not taking:  Reported on 06/10/2018 09/29/17   Susy Frizzle, MD  HYDROcodone-homatropine [facility]) 5-1.5 MG/5ML syrup Take 5 mLs by mouth every 8 (eight) hours as needed for cough. Patient not taking: Reported on 03/29/2018 02/16/18   Susy Frizzle, MD     Allergies  Allergen Reactions  . Aleve [Naproxen Sodium] Nausea And Vomiting    No problem with other NSAIDs  . Amoxicillin   . Morphine And Related     dizzy  . Penicillins     Diarrhea      Family History  Problem Relation Age of Onset  . Diabetes Sister   . Diabetes Brother   . Diabetes Sister   . Cancer Sister        breast     Social History   Socioeconomic History  . Marital status: Married    Spouse name: Financial risk analyst  . Number of children: Not on file  . Years of education: 84  . Highest education level: High school graduate  Occupational History  . Occupation: Retired  Scientific laboratory technician  . Financial resource strain: Not on file  . Food insecurity:    Worry: Never true    Inability: Never true  . Transportation needs:    Medical: No    Non-medical: No  Tobacco Use  . Smoking status: Former Smoker    Packs/day: 1.00    Years: 8.00    Pack years: 8.00    Types: Cigarettes  . Smokeless tobacco: Never Used  Substance and Sexual Activity  . Alcohol use: Yes    Comment: occasional  . Drug use: No  . Sexual activity: Yes    Comment: married  Lifestyle  . Physical activity:    Days per week: 0 days    Minutes per session: 0 min  . Stress: Only a little  Relationships  . Social connections:    Talks on phone: Not on file    Gets together: Not on file    Attends religious service: Not on file    Active member of club or organization: Not on file    Attends meetings of clubs or organizations: Not on file    Relationship status: Not on file  . Intimate partner violence:    Fear of current or ex partner: Not on file    Emotionally abused: Not on file    Physically abused: Not on file    Forced sexual activity:  Not on file  Other Topics Concern  . Not on file  Social History Narrative  . Not on file     Review of Systems  Constitutional: Negative.   HENT: Negative.   Eyes: Negative.   Respiratory: Negative.   Cardiovascular: Negative.   Gastrointestinal: Negative.   Endocrine: Negative.  Genitourinary: Negative.   Musculoskeletal: Positive for joint swelling and myalgias. Negative for arthralgias and gait problem.  Skin: Positive for color change. Negative for rash and wound.  Allergic/Immunologic: Negative.   Neurological: Negative.  Negative for tingling, weakness and numbness.  Hematological: Bruises/bleeds easily.  Psychiatric/Behavioral: Negative.   All other systems reviewed and are negative.      Objective:    Vitals:   06/10/18 0938  BP: 132/70  Pulse: 88  Resp: 18  Temp: 97.6 F (36.4 C)  SpO2: 94%  Weight: 149 lb 12.8 oz (67.9 kg)  Height: 5\' 10"  (1.778 m)      Physical Exam Vitals signs and nursing note reviewed.  Constitutional:      General: He is not in acute distress.    Appearance: He is well-developed. He is obese. He is not ill-appearing.     Comments: Elderly male well appearing  HENT:     Head: Normocephalic and atraumatic.     Right Ear: External ear normal.     Nose: Nose normal.     Mouth/Throat:     Mouth: Mucous membranes are moist.  Eyes:     General:        Right eye: No discharge.        Left eye: No discharge.     Conjunctiva/sclera: Conjunctivae normal.  Neck:     Trachea: No tracheal deviation.  Cardiovascular:     Rate and Rhythm: Normal rate and regular rhythm.  Pulmonary:     Effort: Pulmonary effort is normal. No respiratory distress.     Breath sounds: No stridor.  Musculoskeletal:     Left knee: He exhibits decreased range of motion. He exhibits no swelling, no deformity and no erythema. No tenderness (no ttp to popliteal fossa, no palpable cord) found.     Left ankle: He exhibits swelling and ecchymosis. He exhibits  normal range of motion, no deformity, no laceration and normal pulse. No tenderness. Achilles tendon normal.     Left lower leg: He exhibits swelling. He exhibits no bony tenderness, no deformity and no laceration. No edema.     Left foot: Normal range of motion and normal capillary refill. Swelling present. No tenderness, bony tenderness, crepitus or deformity.     Comments: Right BKA in prosthetic  Left anteriolateral leg from just below midshin and below is diffusely swollen with bruising and ecchymosis through dorsal left foot and to all dorsal toes sparing tips of toes.  Normal ROM of L foot, toes, ankle and kneeNo bony tenderness to ankle, foot toes.  No pitting edema, no erythema, no warmth, no deformity.  No ttp Normal sensation to light touch to LLE and all toes, brisk capillary refill of all toes Skin appears tight and dry  Skin:    General: Skin is warm and dry.     Capillary Refill: Capillary refill takes less than 2 seconds.     Findings: No erythema or rash.  Neurological:     Mental Status: He is alert.     Sensory: No sensory deficit.     Motor: No abnormal muscle tone.     Gait: Gait (no limp) normal.  Psychiatric:        Behavior: Behavior normal.           Assessment & Plan:      ICD-10-CM   1. Contusion of left leg, initial encounter S80.12XA DG Tibia/Fibula Left    traMADol (ULTRAM) 50 MG tablet    RICE  tx, tramadol for severe pain preventing sleep at night, xray to r/o osseous injury, otherwise suspect contusion, pain, swelling and bruising to gradually improve. Can f/up with ortho if desired - pt wanted to get xray today, but also said that he previously went to ortho at [facility] and would like to go if they could get in quickly (also pt has seen Dr. Reynaldo Minium at [facility])  He was concerned with blood clots, but highly unlikely with brilinta and ASA, no exam findings concerning for DVT, definitely a fairly significant contusion to his  leg with expected dependent spread of bruising and ecchymosis.  I would not be surprised if he had hematoma on US imaging, but there is no redness, no signs of inflammation/irritation/infection, and tx would not change - would expect gradual improvement of swelling bruising over the next 2 weeks.   I understand their concern with right BKA and this is his "only leg" and I have offered to refer them to ortho specialist of their choice is xray is negative.    Delsa Grana, PA-C 06/10/18 11:21 AM

## 2018-06-17 ENCOUNTER — Encounter: Payer: Self-pay | Admitting: Family Medicine

## 2018-06-17 ENCOUNTER — Other Ambulatory Visit: Payer: Self-pay

## 2018-06-17 ENCOUNTER — Ambulatory Visit (INDEPENDENT_AMBULATORY_CARE_PROVIDER_SITE_OTHER): Payer: PPO | Admitting: Family Medicine

## 2018-06-17 VITALS — BP 122/64 | HR 66 | Temp 97.9°F | Resp 16 | Ht 70.0 in | Wt 149.0 lb

## 2018-06-17 DIAGNOSIS — I482 Chronic atrial fibrillation, unspecified: Secondary | ICD-10-CM | POA: Diagnosis not present

## 2018-06-17 DIAGNOSIS — I10 Essential (primary) hypertension: Secondary | ICD-10-CM | POA: Diagnosis not present

## 2018-06-17 DIAGNOSIS — L03116 Cellulitis of left lower limb: Secondary | ICD-10-CM

## 2018-06-17 MED ORDER — CEPHALEXIN 500 MG PO CAPS: 500.0000 mg | ORAL_CAPSULE | Freq: Four times a day (QID) | ORAL | 0 refills | Status: DC

## 2018-06-17 NOTE — Progress Notes (Signed)
Subjective:    Patient ID: Eugene [name], male    DOB: [birth year], 85 y.o.   MRN: [id number]  HPI Approximately 1 week ago, the patient was injured while working with his tractor.  A metal bar on the back of the tractor dropped on the anterior surface of his left shin.  He sustained a large hematoma and there was significant bruising over the anterior lateral left shin all the way down to the ankle.  He was seen by my partner and an x-ray was negative for any infection although it did show soft tissue swelling.  The patient is back today because the pain is worsening.  He has a purple bruise over the anterior lateral superior portion of his left shin.  This is purple in the center and turning yellow and brown in the periphery.  He also has bruising over the dorsal surfaces of his first and second toes on his left foot.  There is some yellowish discoloration to the anterior surface of his dorsum of his left foot.  However there is a patch of bright red erythematous skin with induration and swelling over the inferior portion of his left shin just above his ankle.  This is 14 cm x 8 cm.  It is hot to the touch.  It is tender to the touch.  It has blanching erythema.  There is an abrasion in the center where the bar hit which appears to be the portal of entry and the skin appears to be developing cellulitis.  This is the area where the pain is worse.  He is able to bear weight on his foot and walk without exacerbating the pain however it is tender to the touch in that area and there is palpable heat in that area.  The skin and underlying tissues are also swollen in that area which was the location of the hematoma initially. Past Medical History:  Diagnosis Date   Amputated right leg ([place])    CAD (coronary artery disease)    a. 09/2017 - acute MI -  emergent cath showing 99% stenosis in midLAD with thrombotic stenosis at the bifurcation of the second diagonal, which was relatively small in diameter. He  underwent placement of DES to midLAD which jailed the second diagonal.    Chronic combined systolic and diastolic heart failure ([facility])    Collagenous colitis    Dr. Festus Holts   History of ST elevation myocardial infarction (STEMI)    09/2017: LAD   History of stomach ulcers    Hyperlipidemia LDL goal <70    Ischemic cardiomyopathy    Past Surgical History:  Procedure Laterality Date   BACK SURGERY     CARDIAC CATHETERIZATION     CORONARY STENT INTERVENTION N/A 10/07/2017   Procedure: CORONARY STENT INTERVENTION;  Surgeon: Wellington Hampshire, MD;  Location: [place] [facility];  Service: Cardiovascular;  Laterality: N/A;   CORONARY/GRAFT ACUTE MI REVASCULARIZATION N/A 10/07/2017   Procedure: Coronary/Graft Acute MI Revascularization;  Surgeon: Wellington Hampshire, MD;  Location: [place] [facility];  Service: Cardiovascular;  Laterality: N/A;   LEFT HEART CATH AND CORONARY ANGIOGRAPHY N/A 10/07/2017   Procedure: LEFT HEART CATH AND CORONARY ANGIOGRAPHY;  Surgeon: Wellington Hampshire, MD;  Location: [place] [facility];  Service: Cardiovascular;  Laterality: N/A;   LEG AMPUTATION     Right Below knee   STOMACH SURGERY     [place] II gastrojejunostomy   TOTAL KNEE ARTHROPLASTY     Current Outpatient  Medications on File Prior to Visit  Medication Sig Dispense Refill   aspirin 81 MG tablet Take 81 mg by mouth at bedtime.     atorvastatin (LIPITOR) 80 MG tablet Take 1 tablet (80 mg total) by mouth daily at 6 PM. 90 tablet 3   carvedilol (COREG) 3.125 MG tablet Take 1 tablet (3.125 mg total) by mouth 2 (two) times daily with a meal. 180 tablet 3   Cetirizine HCl (ZYRTEC ALLERGY) 10 MG TBDP Take 10 mg by mouth at bedtime. 30 tablet 0   fluticasone (FLONASE) 50 MCG/ACT nasal spray Place 2 sprays into both nostrils daily. 16 g 2   losartan (COZAAR) 25 MG tablet Take 0.5 tablets (12.5 mg total) by mouth daily. 45 tablet 3   multivitamin-lutein (OCUVITE-LUTEIN) CAPS capsule Take 1  capsule by mouth daily. Patient says he is taking twice a day     nitroGLYCERIN (NITROSTAT) 0.4 MG SL tablet Place 1 tablet (0.4 mg total) under the tongue every 5 (five) minutes x 3 doses as needed for chest pain. 25 tablet 3   ticagrelor (BRILINTA) 90 MG TABS tablet Take 1 tablet (90 mg total) by mouth 2 (two) times daily. 180 tablet 3   traMADol (ULTRAM) 50 MG tablet Take 1 tablet (50 mg total) by mouth every 8 (eight) hours as needed for severe pain. 15 tablet 0   No current facility-administered medications on file prior to visit.    Allergies  Allergen Reactions   Aleve [Naproxen Sodium] Nausea And Vomiting    No problem with other NSAIDs   Amoxicillin    Morphine And Related     dizzy   Penicillins     Diarrhea    Social History   Socioeconomic History   Marital status: Married    Spouse name: Financial risk analyst   Number of children: Not on file   Years of education: 12   Highest education level: High school graduate  Occupational History   Occupation: Retired  Scientist, product/process development strain: Not on file   Food insecurity:    Worry: Never true    Inability: Never true   Transportation needs:    Medical: No    Non-medical: No  Tobacco Use   Smoking status: Former Smoker    Packs/day: 1.00    Years: 8.00    Pack years: 8.00    Types: Cigarettes   Smokeless tobacco: Never Used  Substance and Sexual Activity   Alcohol use: Yes    Comment: occasional   Drug use: No   Sexual activity: Yes    Comment: married  Lifestyle   Physical activity:    Days per week: 0 days    Minutes per session: 0 min   Stress: Only a little  Relationships   Social connections:    Talks on phone: Not on file    Gets together: Not on file    Attends religious service: Not on file    Active member of club or organization: Not on file    Attends meetings of clubs or organizations: Not on file    Relationship status: Not on file   Intimate partner violence:     Fear of current or ex partner: Not on file    Emotionally abused: Not on file    Physically abused: Not on file    Forced sexual activity: Not on file  Other Topics Concern   Not on file  Social History Narrative   Not on file  Review of Systems  All other systems reviewed and are negative.      Objective:   Physical Exam Vitals signs reviewed.  Constitutional:      General: He is not in acute distress.    Appearance: He is well-developed. He is not diaphoretic.  HENT:     Nose: Mucosal edema present.     Right Sinus: No maxillary sinus tenderness or frontal sinus tenderness.     Left Sinus: No maxillary sinus tenderness or frontal sinus tenderness.  Cardiovascular:     Rate and Rhythm: Normal rate and regular rhythm.     Heart sounds: Normal heart sounds.  Pulmonary:     Effort: Pulmonary effort is normal. No respiratory distress.     Breath sounds: Normal breath sounds. No stridor. No wheezing or rales.  Musculoskeletal:     Left ankle: He exhibits normal range of motion. No tenderness. No lateral malleolus and no medial malleolus tenderness found.       Legs:  Skin:    Findings: Bruising, ecchymosis and erythema present.           Assessment & Plan:  Cellulitis of left lower extremity  Patient has developed a secondary cellulitis of his left lower extremity where the hematoma was initially.  It is red hot swollen and painful.  Begin Keflex 500 mg p.o. 4 times daily for 7 days.  Reassess next week if no better or sooner if worse.  I reviewed the x-ray.  There was no evidence of any fracture.  The bruising on the superior portion of his left shin and the dorsum of his left foot are gradually improving.  Nothing needs to be done for those.  That pain should gradually improve with time however the secondary cellulitis needs close monitoring.

## 2018-06-21 ENCOUNTER — Ambulatory Visit: Payer: PPO | Admitting: Family Medicine

## 2018-07-27 ENCOUNTER — Telehealth: Payer: Self-pay | Admitting: Cardiovascular Disease

## 2018-07-27 NOTE — Telephone Encounter (Signed)
VIDEO/Doximity visit on 07/29/2018      Phone Call to obtain consent   -   07/27/2018          Virtual Visit Pre-Appointment Phone Call  "(Name), I am calling you today to discuss your upcoming appointment. We are currently trying to limit exposure to the virus that causes COVID-19 by seeing patients at home rather than in the office."  1. "What is the BEST phone number to call the day of the visit?" - include this in appointment notes  2. Do you have or have access to (through a family member/friend) a smartphone with video capability that we can use for your visit?" a. If yes - list this number in appt notes as cell (if different from BEST phone #) and list the appointment type as a VIDEO visit in appointment notes b. If no - list the appointment type as a PHONE visit in appointment notes  3. Confirm consent - "In the setting of the current Covid19 crisis, you are scheduled for a (phone or video) visit with your provider on (date) at (time).  Just as we do with many in-office visits, in order for you to participate in this visit, we must obtain consent.  If you'd like, I can send this to your mychart (if signed up) or email for you to review.  Otherwise, I can obtain your verbal consent now.  All virtual visits are billed to your insurance company just like a normal visit would be.  By agreeing to a virtual visit, we'd like you to understand that the technology does not allow for your provider to perform an examination, and thus may limit your provider's ability to fully assess your condition. If your provider identifies any concerns that need to be evaluated in person, we will make arrangements to do so.  Finally, though the technology is pretty good, we cannot assure that it will always work on either your or our end, and in the setting of a video visit, we may have to convert it to a phone-only visit.  In either situation, we cannot ensure that we have a secure connection.   Are you willing to proceed?" STAFF: Did the patient verbally acknowledge consent to telehealth visit? Document YES/NO here: YES  4. Advise patient to be prepared - "Two hours prior to your appointment, go ahead and check your blood pressure, pulse, oxygen saturation, and your weight (if you have the equipment to check those) and write them all down. When your visit starts, your provider will ask you for this information. If you have an Apple Watch or Kardia device, please plan to have heart rate information ready on the day of your appointment. Please have a pen and paper handy nearby the day of the visit as well."  5. Give patient instructions for MyChart download to smartphone OR Doximity/Doxy.me as below if video visit (depending on what platform provider is using)  6. Inform patient they will receive a phone call 15 minutes prior to their appointment time (may be from unknown caller ID) so they should be prepared to answer    TELEPHONE CALL NOTE  Eugene [name] has been deemed a candidate for a follow-up tele-health visit to limit community exposure during the Covid-19 pandemic. I spoke with the patient via phone to ensure availability of phone/video source, confirm preferred email & phone number, and discuss instructions and expectations.  I reminded Eugene [name] to be prepared with any vital sign  and/or heart rhythm information that could potentially be obtained via home monitoring, at the time of his visit. I reminded Eugene [name] to expect a phone call prior to his visit.  Thayer Headings 07/27/2018 10:23 AM   INSTRUCTIONS FOR DOWNLOADING THE MYCHART APP TO SMARTPHONE  - The patient must first make sure to have activated MyChart and know their login information - If Apple, go to [employer] and type in MyChart in the search bar and download the app. If Android, ask patient to go to [employer] and type in [place] in the search bar and download the app. The app is free but as with any  other app downloads, their phone may require them to verify saved payment information or Apple/Android password.  - The patient will need to then log into the app with their MyChart username and password, and select [facility] as their healthcare provider to link the account. When it is time for your visit, go to the MyChart app, find appointments, and click Begin Video Visit. Be sure to Select Allow for your device to access the Microphone and Camera for your visit. You will then be connected, and your provider will be with you shortly.  **If they have any issues connecting, or need assistance please contact MyChart service desk [phone])**  **If using a computer, in order to ensure the best quality for their visit they will need to use either of the following Internet Browsers: [employer], or Google Chrome**  IF USING DOXIMITY or DOXY.ME - The patient will receive a link just prior to their visit by text.     FULL LENGTH CONSENT FOR TELE-HEALTH VISIT   I hereby voluntarily request, consent and authorize [facility] and its employed or contracted physicians, physician assistants, nurse practitioners or other licensed health care professionals (the Practitioner), to provide me with telemedicine health care services (the Services") as deemed necessary by the treating Practitioner. I acknowledge and consent to receive the Services by the Practitioner via telemedicine. I understand that the telemedicine visit will involve communicating with the Practitioner through live audiovisual communication technology and the disclosure of certain medical information by electronic transmission. I acknowledge that I have been given the opportunity to request an in-person assessment or other available alternative prior to the telemedicine visit and am voluntarily participating in the telemedicine visit.  I understand that I have the right to withhold or withdraw my consent to the use of  telemedicine in the course of my care at any time, without affecting my right to future care or treatment, and that the Practitioner or I may terminate the telemedicine visit at any time. I understand that I have the right to inspect all information obtained and/or recorded in the course of the telemedicine visit and may receive copies of available information for a reasonable fee.  I understand that some of the potential risks of receiving the Services via telemedicine include:   Delay or interruption in medical evaluation due to technological equipment failure or disruption;  Information transmitted may not be sufficient (e.g. poor resolution of images) to allow for appropriate medical decision making by the Practitioner; and/or   In rare instances, security protocols could fail, causing a breach of personal health information.  Furthermore, I acknowledge that it is my responsibility to provide information about my medical history, conditions and care that is complete and accurate to the best of my ability. I acknowledge that Practitioner's advice, recommendations, and/or decision may be based  on factors not within their control, such as incomplete or inaccurate data provided by me or distortions of diagnostic images or specimens that may result from electronic transmissions. I understand that the practice of medicine is not an exact science and that Practitioner makes no warranties or guarantees regarding treatment outcomes. I acknowledge that I will receive a copy of this consent concurrently upon execution via email to the email address I last provided but may also request a printed copy by calling the office of [facility].    I understand that my insurance will be billed for this visit.   I have read or had this consent read to me.  I understand the contents of this consent, which adequately explains the benefits and risks of the Services being provided via telemedicine.   I have been  provided ample opportunity to ask questions regarding this consent and the Services and have had my questions answered to my satisfaction.  I give my informed consent for the services to be provided through the use of telemedicine in my medical care  By participating in this telemedicine visit I agree to the above.

## 2018-07-28 NOTE — Progress Notes (Signed)
Virtual Visit via Video Note   This visit type was conducted due to national recommendations for restrictions regarding the COVID-19 Pandemic (e.g. social distancing) in an effort to limit this patient's exposure and mitigate transmission in our community.  Due to his co-morbid illnesses, this patient is at least at moderate risk for complications without adequate follow up.  This format is felt to be most appropriate for this patient at this time.  All issues noted in this document were discussed and addressed.  A limited physical exam was performed with this format.  Please refer to the patient's chart for his consent to telehealth for [facility].   Date:  07/29/2018   ID:  Eugene [name], DOB 03/11/1932, MRN [id number]  Patient Location: Home Provider Location: Home  PCP:  Susy Frizzle, MD  Cardiologist:  Lauree Chandler, MD  Electrophysiologist:  None   Evaluation Performed:  Follow-Up Visit  Chief Complaint:  Follow up- CAD  History of Present Illness:    Eugene [name] is a 85 y.o. 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 He was admitted to [facility] in July 2019 with an anterior ST elevation MI. Cardiac cath showed a 99% mid LAD stenosis. A drug eluting stent was placed in the mid LAD. Echo 10/08/17 with LVEF=35-40% with akinesis of the anterior and apical walls. He has tolerated a beta blocker and ARB. Echo December 2019 with LVEF=55-60%. Grade 1 diastolic dysfunction. There was mild aortic stenosis (mean gradient 18mmHg), mild MR.   The patient denies chest pain, dyspnea, palpitations, dizziness, near syncope or syncope. No lower extremity edema.   The patient does not have symptoms concerning for COVID-19 infection (fever, chills, cough, or new shortness of breath).    Past  Medical History:  Diagnosis Date  . Amputated right leg ([place])   . CAD (coronary artery disease)    a. 09/2017 - acute MI -  emergent cath showing 99% stenosis in midLAD with thrombotic stenosis at the bifurcation of the second diagonal, which was relatively small in diameter. He underwent placement of DES to midLAD which jailed the second diagonal.   . Chronic combined systolic and diastolic heart failure ([place])   . Collagenous colitis    Dr. Festus Holts  . History of ST elevation myocardial infarction (STEMI)    09/2017: LAD  . History of stomach ulcers   . Hyperlipidemia LDL goal <70   . Ischemic cardiomyopathy    Past Surgical History:  Procedure Laterality Date  . BACK SURGERY    . CARDIAC CATHETERIZATION    . CORONARY STENT INTERVENTION N/A 10/07/2017   Procedure: CORONARY STENT INTERVENTION;  Surgeon: Wellington Hampshire, MD;  Location: [facility];  Service: Cardiovascular;  Laterality: N/A;  . CORONARY/GRAFT ACUTE MI REVASCULARIZATION N/A 10/07/2017   Procedure: Coronary/Graft Acute MI Revascularization;  Surgeon: Wellington Hampshire, MD;  Location: [facility];  Service: Cardiovascular;  Laterality: N/A;  . LEFT HEART CATH AND CORONARY ANGIOGRAPHY N/A 10/07/2017   Procedure: LEFT HEART CATH AND CORONARY ANGIOGRAPHY;  Surgeon: Wellington Hampshire, MD;  Location: [facility];  Service: Cardiovascular;  Laterality: N/A;  . LEG AMPUTATION     Right Below knee  . STOMACH SURGERY     Billroth II gastrojejunostomy  . TOTAL KNEE ARTHROPLASTY  Current Meds  Medication Sig  . aspirin 81 MG tablet Take 81 mg by mouth at bedtime.  Marland Kitchen atorvastatin (LIPITOR) 80 MG tablet Take 1 tablet (80 mg total) by mouth daily at 6 PM.  . carvedilol (COREG) 3.125 MG tablet Take 1 tablet (3.125 mg total) by mouth 2 (two) times daily with a meal.  . cephALEXin (KEFLEX) 500 MG capsule Take 1 capsule (500 mg total) by mouth 4 (four) times daily.  . Cetirizine HCl (ZYRTEC ALLERGY) 10 MG  TBDP Take 10 mg by mouth at bedtime.  . fluticasone (FLONASE) 50 MCG/ACT nasal spray Place 2 sprays into both nostrils daily.  Marland Kitchen losartan (COZAAR) 25 MG tablet Take 0.5 tablets (12.5 mg total) by mouth daily.  . multivitamin-lutein (OCUVITE-LUTEIN) CAPS capsule Take 1 capsule by mouth daily. Patient says he is taking twice a day  . nitroGLYCERIN (NITROSTAT) 0.4 MG SL tablet Place 1 tablet (0.4 mg total) under the tongue every 5 (five) minutes x 3 doses as needed for chest pain.  . ticagrelor (BRILINTA) 90 MG TABS tablet Take 1 tablet (90 mg total) by mouth 2 (two) times daily.  . traMADol (ULTRAM) 50 MG tablet Take 1 tablet (50 mg total) by mouth every 8 (eight) hours as needed for severe pain.     Allergies:   Aleve [naproxen sodium]; Amoxicillin; Morphine and related; and Penicillins   Social History   Tobacco Use  . Smoking status: Former Smoker    Packs/day: 1.00    Years: 8.00    Pack years: 8.00    Types: Cigarettes  . Smokeless tobacco: Never Used  Substance Use Topics  . Alcohol use: Yes    Comment: occasional  . Drug use: No     Family Hx: The patient's family history includes Cancer in his sister; Diabetes in his brother, sister, and sister.  ROS:   Please see the history of present illness.    All other systems reviewed and are negative.   Prior CV studies:   The following studies were reviewed today:  Echo December 2019: - Left ventricle: The cavity size was normal. Systolic function was   normal. The estimated ejection fraction was in the range of 55%   to 60%. Wall motion was normal; there were no regional wall   motion abnormalities. Doppler parameters are consistent with   abnormal left ventricular relaxation (grade 1 diastolic   dysfunction). Doppler parameters are consistent with high   ventricular filling pressure. - Aortic valve: There was mild stenosis. - Mitral valve: There was mild regurgitation. - Left atrium: The atrium was mildly dilated. -  Impressions: EF much improved from previous.  Impressions:  - EF much improved from previous.  Labs/Other Tests and Data Reviewed:    EKG:  No ECG reviewed.  Recent Labs: 10/10/2017: Hemoglobin 13.4; Platelets 192 11/09/2017: ALT 25; BUN 13; Creatinine, Ser 0.87; Potassium 3.8; Sodium 139   Recent Lipid Panel Lab Results  Component Value Date/Time   CHOL 112 11/09/2017 02:40 PM   TRIG 83 11/09/2017 02:40 PM   HDL 52 11/09/2017 02:40 PM   CHOLHDL 2.2 11/09/2017 02:40 PM   CHOLHDL 2.4 11/03/2017 10:04 AM   LDLCALC 43 11/09/2017 02:40 PM   LDLCALC 51 11/03/2017 10:04 AM    Wt Readings from Last 3 Encounters:  07/29/18 147 lb (66.7 kg)  06/17/18 149 lb (67.6 kg)  06/10/18 149 lb 12.8 oz (67.9 kg)     Objective:    Vital Signs:  BP 136/69  Pulse 70   Ht 5\' 10"  (1.778 m)   Wt 147 lb (66.7 kg)   BMI 21.09 kg/m    VITAL SIGNS:  reviewed GEN:  no acute distress  ASSESSMENT & PLAN:    1. CAD without angina: No chest pain. He had an anterior STEMI in July 2019. The occluded LAD was treated with a drug eluting stent. Continue ASA. Will stop Brilinta and start Plavix 75 mg daily. Continue statin, beta blocker and ARB.    2. Ischemic cardiomyopathy: LVEF=55-60% by echo December 2019 (post MI LVEF was 35-40%). Will continue beta blocker and ARB.    3. Hyperlipidemia: Lipids well controlled. Continue statin.   COVID-19 Education: The signs and symptoms of COVID-19 were discussed with the patient and how to seek care for testing (follow up with PCP or arrange E-visit).  The importance of social distancing was discussed today.  Time:   Today, I have spent 16 minutes with the patient with telehealth technology discussing the above problems.     Medication Adjustments/Labs and Tests Ordered: Current medicines are reviewed at length with the patient today.  Concerns regarding medicines are outlined above.   Tests Ordered: No orders of the defined types were placed in this  encounter.   Medication Changes: Meds ordered this encounter  Medications  . clopidogrel (PLAVIX) 75 MG tablet    Sig: Take 1 tablet (75 mg total) by mouth daily.    Dispense:  90 tablet    Refill:  3    Pt to stop Brilinta after he finishes current bottle and then start Clopidogrel.    Disposition:  Follow up in 6 month(s)  Signed, Lauree Chandler, MD  07/29/2018 9:46 AM    [place]

## 2018-07-29 ENCOUNTER — Encounter: Payer: Self-pay | Admitting: Cardiovascular Disease

## 2018-07-29 ENCOUNTER — Other Ambulatory Visit: Payer: Self-pay

## 2018-07-29 ENCOUNTER — Telehealth: Payer: Self-pay | Admitting: *Deleted

## 2018-07-29 ENCOUNTER — Telehealth (INDEPENDENT_AMBULATORY_CARE_PROVIDER_SITE_OTHER): Payer: PPO | Admitting: Cardiovascular Disease

## 2018-07-29 VITALS — BP 136/69 | HR 70 | Ht 70.0 in | Wt 147.0 lb

## 2018-07-29 DIAGNOSIS — E7800 Pure hypercholesterolemia, unspecified: Secondary | ICD-10-CM | POA: Diagnosis not present

## 2018-07-29 DIAGNOSIS — I255 Ischemic cardiomyopathy: Secondary | ICD-10-CM | POA: Diagnosis not present

## 2018-07-29 DIAGNOSIS — I2510 Atherosclerotic heart disease of native coronary artery without angina pectoris: Secondary | ICD-10-CM

## 2018-07-29 MED ORDER — CLOPIDOGREL BISULFATE 75 MG PO TABS: 75.0000 mg | ORAL_TABLET | Freq: Every day | ORAL | 3 refills | Status: DC

## 2018-07-29 NOTE — Telephone Encounter (Signed)
I placed call to pt to review AVS from today's visit. Left message to call office.

## 2018-07-29 NOTE — Telephone Encounter (Signed)
I spoke with pt and his wife and reviewed AVS with them.  Pt has 2-3 days of Brilinta left.  He is aware prescription [facility]ogrel [facility] [address].

## 2018-07-29 NOTE — Patient Instructions (Signed)
Medication Instructions:  Your physician has recommended you make the following change in your medication: Stop Brilinta after you finish your current bottle.  The next day you should start clopidogrel 75 mg by mouth daily.    If you need a refill on your cardiac medications before your next appointment, please call your pharmacy.   Lab work: none If you have labs (blood work) drawn today and your tests are completely normal, you will receive your results only by: Marland Kitchen MyChart Message (if you have MyChart) OR . A paper copy in the mail If you have any lab test that is abnormal or we need to change your treatment, we will call you to review the results.  Testing/Procedures: none  Follow-Up: At [facility], you and your health needs are our priority.  As part of our continuing mission to provide you with exceptional heart care, we have created designated Provider Care Teams.  These Care Teams include your primary Cardiologist (physician) and Advanced Practice Providers (APPs -  Physician Assistants and Nurse Practitioners) who all work together to provide you with the care you need, when you need it. You will need a follow up appointment in 6 months.  Please call our office 2 months in advance to schedule this appointment.  You may see Lauree Chandler, MD or one of the following Advanced Practice Providers on your designated Care Team:   [place], PA-C Melina Copa, PA-C . Ermalinda Barrios, PA-C  Any Other Special Instructions Will Be Listed Below (If Applicable).

## 2018-08-30 ENCOUNTER — Ambulatory Visit: Payer: PPO | Admitting: Cardiovascular Disease

## 2018-09-30 ENCOUNTER — Other Ambulatory Visit: Payer: Self-pay | Admitting: Cardiovascular Disease

## 2018-09-30 DIAGNOSIS — H35033 Hypertensive retinopathy, bilateral: Secondary | ICD-10-CM | POA: Diagnosis not present

## 2018-09-30 DIAGNOSIS — H35371 Puckering of macula, right eye: Secondary | ICD-10-CM | POA: Diagnosis not present

## 2018-09-30 DIAGNOSIS — H35412 Lattice degeneration of retina, left eye: Secondary | ICD-10-CM | POA: Diagnosis not present

## 2018-09-30 DIAGNOSIS — H353132 Nonexudative age-related macular degeneration, bilateral, intermediate dry stage: Secondary | ICD-10-CM | POA: Diagnosis not present

## 2018-10-04 ENCOUNTER — Telehealth (HOSPITAL_COMMUNITY): Payer: Self-pay | Admitting: *Deleted

## 2018-10-04 NOTE — Telephone Encounter (Signed)
Pr wife left message on voicemail. Returned call and left the following message.  Received notification from [facility] that the outpatient pulmonary rehab referral was placed in error.  Pt needs home pulmonary rehab. This referral was canceled by [facility].  Apologized for the confusion. Cherre Huger, BSN Cardiac and Training and development officer

## 2018-10-08 ENCOUNTER — Other Ambulatory Visit: Payer: Self-pay | Admitting: Cardiovascular Disease

## 2018-10-08 MED ORDER — LOSARTAN POTASSIUM 25 MG PO TABS: 12.5000 mg | ORAL_TABLET | Freq: Every day | ORAL | 1 refills | Status: DC

## 2018-10-08 NOTE — Telephone Encounter (Signed)
New Message    *STAT* If patient is at the pharmacy, call can be transferred to refill team.   1. Which medications need to be refilled? (please list name of each medication and dose if known) losartan (COZAAR) 25 MG tablet   2. Which pharmacy/location (including street and city if local pharmacy) is medication to be sent to? [id number] [place] [place] - [address]  3. Do they need a 30 day or 90 day supply? Guadalupe Guerra

## 2018-10-08 NOTE — Telephone Encounter (Signed)
I have sent the pts Losartan refill in as requested.

## 2018-10-11 ENCOUNTER — Other Ambulatory Visit: Payer: PPO

## 2018-10-11 ENCOUNTER — Other Ambulatory Visit: Payer: Self-pay

## 2018-10-11 DIAGNOSIS — R6889 Other general symptoms and signs: Secondary | ICD-10-CM | POA: Diagnosis not present

## 2018-10-11 DIAGNOSIS — Z20822 Contact with and (suspected) exposure to covid-19: Secondary | ICD-10-CM

## 2018-10-13 ENCOUNTER — Other Ambulatory Visit: Payer: Self-pay | Admitting: Physician Assistant

## 2018-10-13 DIAGNOSIS — L570 Actinic keratosis: Secondary | ICD-10-CM | POA: Diagnosis not present

## 2018-10-13 DIAGNOSIS — D485 Neoplasm of uncertain behavior of skin: Secondary | ICD-10-CM | POA: Diagnosis not present

## 2018-10-13 DIAGNOSIS — L820 Inflamed seborrheic keratosis: Secondary | ICD-10-CM | POA: Diagnosis not present

## 2018-10-13 LAB — NOVEL CORONAVIRUS, NAA: SARS-CoV-2, NAA: NOT DETECTED

## 2018-10-25 ENCOUNTER — Emergency Department (HOSPITAL_COMMUNITY)
Admission: EM | Admit: 2018-10-25 | Discharge: 2018-10-25 | Disposition: A | Discharge status: Home / Self Care | Payer: PPO | Attending: Emergency Medicine | Admitting: Emergency Medicine

## 2018-10-25 ENCOUNTER — Emergency Department (HOSPITAL_COMMUNITY): Payer: PPO

## 2018-10-25 ENCOUNTER — Other Ambulatory Visit: Payer: Self-pay | Admitting: *Deleted

## 2018-10-25 ENCOUNTER — Other Ambulatory Visit: Payer: Self-pay

## 2018-10-25 ENCOUNTER — Encounter (HOSPITAL_COMMUNITY): Payer: Self-pay | Admitting: *Deleted

## 2018-10-25 ENCOUNTER — Telehealth: Payer: Self-pay | Admitting: Cardiovascular Disease

## 2018-10-25 DIAGNOSIS — Z87891 Personal history of nicotine dependence: Secondary | ICD-10-CM | POA: Diagnosis not present

## 2018-10-25 DIAGNOSIS — Z7982 Long term (current) use of aspirin: Secondary | ICD-10-CM | POA: Diagnosis not present

## 2018-10-25 DIAGNOSIS — I2510 Atherosclerotic heart disease of native coronary artery without angina pectoris: Secondary | ICD-10-CM | POA: Diagnosis not present

## 2018-10-25 DIAGNOSIS — R0789 Other chest pain: Secondary | ICD-10-CM | POA: Diagnosis not present

## 2018-10-25 DIAGNOSIS — Z96659 Presence of unspecified artificial knee joint: Secondary | ICD-10-CM | POA: Diagnosis not present

## 2018-10-25 DIAGNOSIS — Z79899 Other long term (current) drug therapy: Secondary | ICD-10-CM | POA: Insufficient documentation

## 2018-10-25 DIAGNOSIS — I5041 Acute combined systolic (congestive) and diastolic (congestive) heart failure: Secondary | ICD-10-CM | POA: Insufficient documentation

## 2018-10-25 DIAGNOSIS — R079 Chest pain, unspecified: Secondary | ICD-10-CM | POA: Diagnosis not present

## 2018-10-25 LAB — BASIC METABOLIC PANEL
Anion gap: 8 (ref 5–15)
BUN: 10 mg/dL (ref 8–23)
CO2: 25 mmol/L (ref 22–32)
Calcium: 8.8 mg/dL — ABNORMAL LOW (ref 8.9–10.3)
Chloride: 104 mmol/L (ref 98–111)
Creatinine, Ser: 0.92 mg/dL (ref 0.61–1.24)
GFR calc Af Amer: >60 mL/min (ref >60)
GFR calc non Af Amer: >60 mL/min (ref >60)
Glucose, Bld: 132 mg/dL — ABNORMAL HIGH (ref 70–99)
Potassium: 4 mmol/L (ref 3.5–5.1)
Sodium: 137 mmol/L (ref 135–145)

## 2018-10-25 LAB — CBC
HCT: 40.8 % (ref 39.0–52.0)
Hemoglobin: 13.4 g/dL (ref 13.0–17.0)
MCH: 31.9 pg (ref 26.0–34.0)
MCHC: 32.8 g/dL (ref 30.0–36.0)
MCV: 97.1 fL (ref 80.0–100.0)
Platelets: 277 10*3/uL (ref 150–400)
RBC: 4.2 MIL/uL — ABNORMAL LOW (ref 4.22–5.81)
RDW: 12.7 % (ref 11.5–15.5)
WBC: 7 10*3/uL (ref 4.0–10.5)
nRBC: 0 % (ref 0.0–0.2)

## 2018-10-25 LAB — TROPONIN I (HIGH SENSITIVITY)
Troponin I (High Sensitivity): 6 ng/L (ref <18)
Troponin I (High Sensitivity): 5 ng/L (ref <18)

## 2018-10-25 MED ORDER — SODIUM CHLORIDE 0.9% FLUSH: 3.0000 mL | Freq: Once | INTRAVENOUS | Status: DC

## 2018-10-25 MED ORDER — DIAZEPAM 2 MG PO TABS
2.0000 mg | ORAL_TABLET | Freq: Once | ORAL | Status: DC
  Filled 2018-10-25: qty 1

## 2018-10-25 MED ORDER — NITROGLYCERIN 0.4 MG SL SUBL: 0.4000 mg | SUBLINGUAL_TABLET | SUBLINGUAL | 3 refills | Status: DC | PRN

## 2018-10-25 NOTE — Telephone Encounter (Signed)
Pt reports dull chest pain of 5 out of 10, does not radiate, denies N/V, sweating, or SOB but reports he has not felt pain like this before and is concerned. Pt has Nitroglycerin but it is expired. Refill has been sent. Advised pt to go to the ED in order to be evaluated.

## 2018-10-25 NOTE — Telephone Encounter (Signed)
 *  STAT* If patient is at the pharmacy, call can be transferred to refill team.   1. Which medications need to be refilled? (please list name of each medication and dose if known) nitroGLYCERIN (NITROSTAT) 0.4 MG SL tablet  2. Which pharmacy/location (including street and city if local pharmacy) is medication to be sent to? [facility] [address] [employer]  3. Do they need a 30 day or 90 day supply? [place]

## 2018-10-25 NOTE — Telephone Encounter (Signed)
  Pt c/o of Chest Pain: STAT if CP now or developed within 24 hours  1. Are you having CP right now? yes  2. Are you experiencing any other symptoms (ex. SOB, nausea, vomiting, sweating)? no  3. How long have you been experiencing CP? Off and on for about a week  4. Is your CP continuous or coming and going? Now the pain is constant  5. Have you taken Nitroglycerin? Yes, but he needs a new refill   ?

## 2018-10-25 NOTE — Telephone Encounter (Signed)
Rx sent in as requested. 

## 2018-10-25 NOTE — ED Triage Notes (Signed)
Pt sent here by MD L sided chest pain x 1 week. Denies sob, nausea or dizziness.

## 2018-10-25 NOTE — Discharge Instructions (Addendum)
Follow-up with your primary care doctor this week.  Return here for any problems

## 2018-10-25 NOTE — ED Notes (Signed)
Patient Alert and oriented to baseline. Stable and ambulatory to baseline. Patient verbalized understanding of the discharge instructions.  Patient belongings were taken by the patient.   

## 2018-10-25 NOTE — ED Provider Notes (Signed)
[name] Provider Note   CSN: [id number] Arrival date & time: 10/25/18  1303     History   Chief Complaint Chief Complaint  Patient presents with  . Chest Pain    HPI Eugene [name] is a 85 y.o. male.     85 year old male presents with persistent left-sided chest discomfort times several days.  Described as aching sensation that does not radiate.  Not associated with dyspnea or diaphoresis.  No recent fever cough or congestion.  Pain is not worse with movement.  Denies any history of trauma.  No leg pain or swelling.  Does have a history of MI and states that this is not similar.  No treatment use prior to arrival.     Past Medical History:  Diagnosis Date  . Amputated right leg ([place])   . CAD (coronary artery disease)    a. 09/2017 - acute MI -  emergent cath showing 99% stenosis in midLAD with thrombotic stenosis at the bifurcation of the second diagonal, which was relatively small in diameter. He underwent placement of DES to midLAD which jailed the second diagonal.   . Chronic combined systolic and diastolic heart failure ([place])   . Collagenous colitis    Dr. Festus Holts  . History of ST elevation myocardial infarction (STEMI)    09/2017: LAD  . History of stomach ulcers   . Hyperlipidemia LDL goal <70   . Ischemic cardiomyopathy     Patient Active Problem List   Diagnosis Date Noted  . CAD (coronary artery disease) 10/10/2017  . Ischemic cardiomyopathy   . Chronic combined systolic and diastolic heart failure ([place])   . Hyperlipidemia LDL goal <70   . Acute ST elevation myocardial infarction (STEMI) involving left anterior descending (LAD) coronary artery ([place]) 10/07/2017  . Collagenous colitis   . History of right below knee amputation ([place]) 10/13/2013  . Sinus tachycardia 06/04/2012  . Anxiety state, unspecified 06/04/2012    Past Surgical History:  Procedure Laterality Date  . BACK SURGERY    . CARDIAC CATHETERIZATION     . CORONARY STENT INTERVENTION N/A 10/07/2017   Procedure: CORONARY STENT INTERVENTION;  Surgeon: Wellington Hampshire, MD;  Location: [facility];  Service: Cardiovascular;  Laterality: N/A;  . CORONARY/GRAFT ACUTE MI REVASCULARIZATION N/A 10/07/2017   Procedure: Coronary/Graft Acute MI Revascularization;  Surgeon: Wellington Hampshire, MD;  Location: [facility];  Service: Cardiovascular;  Laterality: N/A;  . LEFT HEART CATH AND CORONARY ANGIOGRAPHY N/A 10/07/2017   Procedure: LEFT HEART CATH AND CORONARY ANGIOGRAPHY;  Surgeon: Wellington Hampshire, MD;  Location: [facility];  Service: Cardiovascular;  Laterality: N/A;  . LEG AMPUTATION     Right Below knee  . STOMACH SURGERY     Billroth II gastrojejunostomy  . TOTAL KNEE ARTHROPLASTY          Home Medications    Prior to Admission medications   Medication Sig Start Date End Date Taking? Authorizing Provider  aspirin 81 MG tablet Take 81 mg by mouth at bedtime.   Yes [provider]  atorvastatin (LIPITOR) 80 MG tablet TAKE 1 TABLET BY MOUTH EVERY DAY AT 6PM Patient taking differently: Take 80 mg by mouth daily.  10/01/18  Yes Burnell Blanks, MD  carvedilol (COREG) 3.125 MG tablet Take 1 tablet (3.125 mg total) by mouth 2 (two) times daily with a meal. 11/02/17  Yes Dunn, Dayna N, PA-C  clopidogrel (PLAVIX) 75 MG tablet Take  1 tablet (75 mg total) by mouth daily. 07/29/18  Yes Burnell Blanks, MD  losartan (COZAAR) 25 MG tablet Take 0.5 tablets (12.5 mg total) by mouth daily. 10/08/18  Yes Burnell Blanks, MD  Multiple Vitamins-Minerals (PRESERVISION AREDS PO) Take 1 tablet by mouth 2 (two) times daily.   Yes [provider]  nitroGLYCERIN (NITROSTAT) 0.4 MG SL tablet Place 1 tablet (0.4 mg total) under the tongue every 5 (five) minutes x 3 doses as needed for chest pain. 10/25/18  Yes Burnell Blanks, MD  Omega-3 Fatty Acids (FISH OIL PO) Take 1 capsule by mouth at bedtime.    Yes [provider]    Family History Family History  Problem Relation Age of Onset  . Diabetes Sister   . Diabetes Brother   . Diabetes Sister   . Cancer Sister        breast    Social History Social History   Tobacco Use  . Smoking status: Former Smoker    Packs/day: 1.00    Years: 8.00    Pack years: 8.00    Types: Cigarettes  . Smokeless tobacco: Never Used  Substance Use Topics  . Alcohol use: Yes    Comment: occasional  . Drug use: No     Allergies   Aleve [naproxen sodium], Amoxicillin, Morphine and related, and Penicillins   Review of Systems Review of Systems  All other systems reviewed and are negative.    Physical Exam Updated Vital Signs BP 132/64 (BP Location: Right Arm)   Pulse (!) 58   Temp 97.7 F (36.5 C) (Oral)   Resp 16   Ht 1.778 m (5\' 10" )   Wt 65.8 kg   SpO2 98%   BMI 20.81 kg/m   Physical Exam Vitals signs and nursing note reviewed.  Constitutional:      General: He is not in acute distress.    Appearance: Normal appearance. He is well-developed. He is not toxic-appearing.  HENT:     Head: Normocephalic and atraumatic.  Eyes:     General: Lids are normal.     Conjunctiva/sclera: Conjunctivae normal.     Pupils: Pupils are equal, round, and reactive to light.  Neck:     Musculoskeletal: Normal range of motion and neck supple.     Thyroid: No thyroid mass.     Trachea: No tracheal deviation.  Cardiovascular:     Rate and Rhythm: Normal rate and regular rhythm.     Heart sounds: Normal heart sounds. No murmur. No gallop.   Pulmonary:     Effort: Pulmonary effort is normal. No respiratory distress.     Breath sounds: Normal breath sounds. No stridor. No decreased breath sounds, wheezing, rhonchi or rales.  Abdominal:     General: Bowel sounds are normal. There is no distension.     Palpations: Abdomen is soft.     Tenderness: There is no abdominal tenderness. There is no rebound.  Musculoskeletal: Normal range  of motion.        General: No tenderness.  Skin:    General: Skin is warm and dry.     Findings: No abrasion or rash.  Neurological:     Mental Status: He is alert and oriented to person, place, and time.     GCS: GCS eye subscore is 4. GCS verbal subscore is 5. GCS motor subscore is 6.     Cranial Nerves: No cranial nerve deficit.     Sensory: No sensory deficit.  Psychiatric:        Speech: Speech normal.        Behavior: Behavior normal.      ED Treatments / Results  Labs (all labs ordered are listed, but only abnormal results are displayed) Labs Reviewed  BASIC METABOLIC PANEL - Abnormal; Notable for the following components:      Result Value   Glucose, Bld 132 (*)    Calcium 8.8 (*)    All other components within normal limits  CBC - Abnormal; Notable for the following components:   RBC 4.20 (*)    All other components within normal limits  TROPONIN I (HIGH SENSITIVITY)  TROPONIN I (HIGH SENSITIVITY)    EKG EKG Interpretation  Date/Time:  Monday October 25 2018 13:12:19 EDT Ventricular Rate:  66 PR Interval:  142 QRS Duration: 80 QT Interval:  386 QTC Calculation: 404 R Axis:   -14 Text Interpretation:  Sinus rhythm with occasional Premature ventricular complexes Otherwise normal ECG Confirmed by Lacretia Leigh ([id number]) on 10/25/2018 4:45:00 PM   Radiology Dg Chest 2 View  Result Date: 10/25/2018 CLINICAL DATA:  Chest pain EXAM: CHEST - 2 VIEW COMPARISON:  October 07, 2017 FINDINGS: The heart size and mediastinal contours are within normal limits. Both lungs are clear. The visualized skeletal structures are unremarkable. IMPRESSION: No active cardiopulmonary disease. Electronically Signed   By: Constance Holster M.D.   On: 10/25/2018 15:43    Procedures Procedures (including critical care time)  Medications Ordered in ED Medications  sodium chloride flush (NS) 0.9 % injection 3 mL (has no administration in time range)     Initial Impression / Assessment  and Plan / ED Course  I have reviewed the triage vital signs and the nursing notes.  Pertinent labs & imaging results that were available during my care of the patient were reviewed by me and considered in my medical decision making (see chart for details).       Patient's EKG without acute ischemic changes.  Delta troponin here is negative.  Chest x-ray negative.  Patient is had symptoms now for several days and have been persistent.  Low suspicion for ACS, PE.  He has not been tachypneic or short of breath.  Return precautions given  Final Clinical Impressions(s) / ED Diagnoses   Final diagnoses:  None    ED Discharge Orders    None       Lacretia Leigh, MD 10/25/18 [phone]

## 2018-10-26 ENCOUNTER — Encounter: Payer: Self-pay | Admitting: Family Medicine

## 2018-10-26 ENCOUNTER — Ambulatory Visit (INDEPENDENT_AMBULATORY_CARE_PROVIDER_SITE_OTHER): Payer: PPO | Admitting: Family Medicine

## 2018-10-26 ENCOUNTER — Other Ambulatory Visit: Payer: Self-pay

## 2018-10-26 VITALS — BP 110/60 | HR 68 | Temp 97.9°F | Resp 16 | Ht 70.0 in | Wt 144.0 lb

## 2018-10-26 DIAGNOSIS — R0789 Other chest pain: Secondary | ICD-10-CM

## 2018-10-26 DIAGNOSIS — F5101 Primary insomnia: Secondary | ICD-10-CM

## 2018-10-26 MED ORDER — ALPRAZOLAM 0.5 MG PO TABS: 0.5000 mg | ORAL_TABLET | Freq: Every evening | ORAL | 0 refills | Status: DC | PRN

## 2018-10-26 NOTE — Progress Notes (Signed)
Subjective:    Patient ID: Eugene [name], male    DOB: October 08, 1931, 85 y.o.   MRN: [id number]  HPI  Patient has a history of coronary artery disease and was admitted to the hospital July 2019 with an acute myocardial infarction.  He was found to have a 99% stenosis in the mid LAD and underwent drug-eluting stent placement to the mid LAD.  He has been on dual antiplatelet therapy ever since.  Today he went to the emergency room with atypical chest pain.  Patient states that the pain began approximately 3 days ago.  It was located in his left upper chest near his axilla.  It was not related to exertion.  It did not improve with rest.  It was constant and mild.  He states that it is worse it was a 4 out of 10.  After persisting 3 days, he went to the emergency room due to concern that it may be his heart.  In the emergency room he had delta troponins negative x2.  He also had a normal chest x-ray.  He was discharged home with atypical chest pain.  He is here today for follow-up.  He states that the chest pain has resolved and has completely gone away.  His biggest concern today is insomnia.  He states that he is just not sleeping well.  He goes to bed every night and has a difficult time falling asleep.  When he does fall asleep, he awakens easily and then cannot get back to sleep.  He denies any depression.  He denies any anxiety.  He denies any pain keeping him awake Past Medical History:  Diagnosis Date  . Amputated right leg ([place])   . CAD (coronary artery disease)    a. 09/2017 - acute MI -  emergent cath showing 99% stenosis in midLAD with thrombotic stenosis at the bifurcation of the second diagonal, which was relatively small in diameter. He underwent placement of DES to midLAD which jailed the second diagonal.   . Chronic combined systolic and diastolic heart failure ([place])   . Collagenous colitis    Dr. Festus Holts  . History of ST elevation myocardial infarction (STEMI)    09/2017: LAD  . History  of stomach ulcers   . Hyperlipidemia LDL goal <70   . Ischemic cardiomyopathy    Past Surgical History:  Procedure Laterality Date  . BACK SURGERY    . CARDIAC CATHETERIZATION    . CORONARY STENT INTERVENTION N/A 10/07/2017   Procedure: CORONARY STENT INTERVENTION;  Surgeon: Wellington Hampshire, MD;  Location: [facility];  Service: Cardiovascular;  Laterality: N/A;  . CORONARY/GRAFT ACUTE MI REVASCULARIZATION N/A 10/07/2017   Procedure: Coronary/Graft Acute MI Revascularization;  Surgeon: Wellington Hampshire, MD;  Location: [facility];  Service: Cardiovascular;  Laterality: N/A;  . LEFT HEART CATH AND CORONARY ANGIOGRAPHY N/A 10/07/2017   Procedure: LEFT HEART CATH AND CORONARY ANGIOGRAPHY;  Surgeon: Wellington Hampshire, MD;  Location: [facility];  Service: Cardiovascular;  Laterality: N/A;  . LEG AMPUTATION     Right Below knee  . STOMACH SURGERY     Billroth II gastrojejunostomy  . TOTAL KNEE ARTHROPLASTY     Current Outpatient Medications on File Prior to Visit  Medication Sig Dispense Refill  . aspirin 81 MG tablet Take 81 mg by mouth at bedtime.    Marland Kitchen atorvastatin (LIPITOR) 80 MG tablet TAKE 1 TABLET BY MOUTH EVERY DAY AT 6PM (Patient taking differently:  Take 80 mg by mouth daily. ) 90 tablet 3  . carvedilol (COREG) 3.125 MG tablet Take 1 tablet (3.125 mg total) by mouth 2 (two) times daily with a meal. 180 tablet 3  . clopidogrel (PLAVIX) 75 MG tablet Take 1 tablet (75 mg total) by mouth daily. 90 tablet 3  . losartan (COZAAR) 25 MG tablet Take 0.5 tablets (12.5 mg total) by mouth daily. 45 tablet 1  . Multiple Vitamins-Minerals (PRESERVISION AREDS PO) Take 1 tablet by mouth 2 (two) times daily.    . nitroGLYCERIN (NITROSTAT) 0.4 MG SL tablet Place 1 tablet (0.4 mg total) under the tongue every 5 (five) minutes x 3 doses as needed for chest pain. 25 tablet 3  . Omega-3 Fatty Acids (FISH OIL PO) Take 1 capsule by mouth at bedtime.     No current facility-administered  medications on file prior to visit.    Allergies  Allergen Reactions  . Aleve [Naproxen Sodium] Nausea And Vomiting    No problem with other NSAIDs  . Amoxicillin Other (See Comments)    Did it involve swelling of the face/tongue/throat, SOB, or low BP? No Did it involve sudden or severe rash/hives, skin peeling, or any reaction on the inside of your mouth or nose? No Did you need to seek medical attention at a hospital or doctor's office? unknown When did it last happen?yrs ago dizziness If all above answers are "NO", may proceed with cephalosporin use.   Marland Kitchen Morphine And Related     dizzy  . Penicillins     Diarrhea Did it involve swelling of the face/tongue/throat, SOB, or low BP? No Did it involve sudden or severe rash/hives, skin peeling, or any reaction on the inside of your mouth or nose? No Did you need to seek medical attention at a hospital or doctor's office? Unknown When did it last happen?years ago If all above answers are "NO", may proceed with cephalosporin use.    Social History   Socioeconomic History  . Marital status: Married    Spouse name: Financial risk analyst  . Number of children: Not on file  . Years of education: 25  . Highest education level: High school graduate  Occupational History  . Occupation: Retired  Scientific laboratory technician  . Financial resource strain: Not on file  . Food insecurity    Worry: Never true    Inability: Never true  . Transportation needs    Medical: No    Non-medical: No  Tobacco Use  . Smoking status: Former Smoker    Packs/day: 1.00    Years: 8.00    Pack years: 8.00    Types: Cigarettes  . Smokeless tobacco: Never Used  Substance and Sexual Activity  . Alcohol use: Yes    Comment: occasional  . Drug use: No  . Sexual activity: Yes    Comment: married  Lifestyle  . Physical activity    Days per week: 0 days    Minutes per session: 0 min  . Stress: Only a little  Relationships  . Social Herbalist on phone: Not  on file    Gets together: Not on file    Attends religious service: Not on file    Active member of club or organization: Not on file    Attends meetings of clubs or organizations: Not on file    Relationship status: Not on file  . Intimate partner violence    Fear of current or ex partner: Not on file  Emotionally abused: Not on file    Physically abused: Not on file    Forced sexual activity: Not on file  Other Topics Concern  . Not on file  Social History Narrative  . Not on file    Review of Systems  All other systems reviewed and are negative.      Objective:   Physical Exam  Constitutional: He appears well-developed and well-nourished. No distress.  Neck: Neck supple. No JVD present.  Cardiovascular: Normal rate, regular rhythm and normal heart sounds. Exam reveals no friction rub.  No murmur heard. Pulmonary/Chest: Effort normal and breath sounds normal. No respiratory distress. He has no wheezes. He has no rales. He exhibits no tenderness.  Abdominal: Soft. Bowel sounds are normal. He exhibits no distension. There is no abdominal tenderness. There is no rebound and no guarding.  Musculoskeletal:        General: No deformity or edema.  Skin: He is not diaphoretic.  Vitals reviewed.         Assessment & Plan:  The primary encounter diagnosis was Atypical chest pain. A diagnosis of Primary insomnia was also pertinent to this visit. Chest pain sounds atypical.  I do not believe his pain is cardiac in origin.  I suspect it was most likely muscular.  It is since resolved.  Therefore if his chest pain remains absent I feel no further work-up is necessary.  We discussed at length treating his insomnia.  We discussed trying trazodone or doxepin for nonaddictive options.  However I am concerned about anticholinergic side effects in the elderly.  We also discussed addictive options such as Xanax or Ativan.  Although addictive, the patient would use this sparingly and he has  taken Xanax before in the past without difficulty.  My biggest concern is the risk of falls.  I explained to the patient I want him to be very careful with this medication and use Xanax sparingly.  If it affects his balance in any way on him but discontinue the medication to avoid an unintended accident.  I will give him Xanax 0.5 mg p.o. nightly as needed insomnia.  He agrees.

## 2018-11-08 ENCOUNTER — Other Ambulatory Visit: Payer: Self-pay

## 2018-11-08 ENCOUNTER — Encounter: Payer: Self-pay | Admitting: Family Medicine

## 2018-11-08 ENCOUNTER — Ambulatory Visit (INDEPENDENT_AMBULATORY_CARE_PROVIDER_SITE_OTHER): Payer: PPO | Admitting: Family Medicine

## 2018-11-08 VITALS — BP 100/60 | HR 58 | Temp 97.5°F | Resp 14 | Ht 70.0 in | Wt 143.0 lb

## 2018-11-08 DIAGNOSIS — K52839 Microscopic colitis, unspecified: Secondary | ICD-10-CM

## 2018-11-08 DIAGNOSIS — Z0001 Encounter for general adult medical examination with abnormal findings: Secondary | ICD-10-CM | POA: Diagnosis not present

## 2018-11-08 DIAGNOSIS — E785 Hyperlipidemia, unspecified: Secondary | ICD-10-CM

## 2018-11-08 DIAGNOSIS — Z23 Encounter for immunization: Secondary | ICD-10-CM | POA: Diagnosis not present

## 2018-11-08 DIAGNOSIS — Z0000 Encounter for general adult medical examination without abnormal findings: Secondary | ICD-10-CM

## 2018-11-08 DIAGNOSIS — I2510 Atherosclerotic heart disease of native coronary artery without angina pectoris: Secondary | ICD-10-CM

## 2018-11-08 LAB — COMPLETE METABOLIC PANEL WITH GFR
AG Ratio: 1.7 (calc) (ref 1.0–2.5)
ALT: 24 U/L (ref 9–46)
AST: 27 U/L (ref 10–35)
Albumin: 3.7 g/dL (ref 3.6–5.1)
Alkaline phosphatase (APISO): 110 U/L (ref 35–144)
BUN: 13 mg/dL (ref 7–25)
CO2: 29 mmol/L (ref 20–32)
Calcium: 9 mg/dL (ref 8.6–10.3)
Chloride: 107 mmol/L (ref 98–110)
Creat: 0.78 mg/dL (ref 0.70–1.11)
GFR, Est African American: 95 mL/min/{1.73_m2} (ref >=60)
GFR, Est Non African American: 82 mL/min/{1.73_m2} (ref >=60)
Globulin: 2.2 g/dL (calc) (ref 1.9–3.7)
Glucose, Bld: 86 mg/dL (ref 65–99)
Potassium: 4.3 mmol/L (ref 3.5–5.3)
Sodium: 140 mmol/L (ref 135–146)
Total Bilirubin: 1.1 mg/dL (ref 0.2–1.2)
Total Protein: 5.9 g/dL — ABNORMAL LOW (ref 6.1–8.1)

## 2018-11-08 LAB — LIPID PANEL
Cholesterol: 105 mg/dL (ref <200)
HDL: 42 mg/dL (ref >=40)
LDL Cholesterol (Calc): 50 mg/dL (calc)
Non-HDL Cholesterol (Calc): 63 mg/dL (calc) (ref <130)
Total CHOL/HDL Ratio: 2.5 (calc) (ref <5.0)
Triglycerides: 47 mg/dL (ref <150)

## 2018-11-08 NOTE — Progress Notes (Signed)
Subjective:    Patient ID: Eugene [name], male    DOB: February 15, 1932, 85 y.o.   MRN: [id number]  HPI  Patient is scheduled today for complete physical exam.  Patient is a very pleasant 85 year old Caucasian male.  He denies any medical concerns today.  I last saw him for atypical chest pain.  Patient has been experiencing occasional anxiety and also some insomnia.  At his last visit we gave the patient a prescription for Xanax at a very low dose to be used sparingly to help him sleep and for occasional anxiety.  He states that he takes the pill and breaks in half.  When he takes a half a pill it works very well.  I again cautioned the patient that this medication can be habit-forming if used constantly.  If he uses it sparingly I do not feel that it will create any issues for him.  I am more concerned about the medicine making him dizzy or sleepy or causing him to fall.  However at the present time he denies any issues with dizziness or drowsiness on the medication.  Patient states that he has not fallen in the last 12 months.  He denies any injuries from falls.  He denies any trouble with depression or anhedonia.  He feels very happy in general.  His only frustration is that he cannot do more physically due to age however he is still extremely active and 43.  We performed a Mini-Mental status exam.  The patient did very well on short-term memory testing.  He remembered 3 out of 3 words on recall.  He was able to spell world in reverse and also to perform serial sevens.  He denies any significant memory impairment although he does admit to occasionally losing items and normal age-related memory problems.  He is due to recheck a CMP and fasting lipid panel. His colonoscopy is up-to-date.  Due to age, I have recommended against prostate cancer screening.  He is due for a flu shot today.  Initially he stated that he preferred to wait on the flu shot.  However I recommended that he go ahead and get it today to avoid  having to come back to the office particulate during the current coronavirus pandemic.  I believe it would be safer to go ahead and receive the vaccine today.  Therefore he agrees.  Immunization History  Administered Date(s) Administered  . Influenza, High Dose Seasonal PF 01/08/2017  . Influenza,inj,Quad PF,6+ Mos 12/28/2012, 01/17/2014  . Influenza-Unspecified 01/15/2016  . Pneumococcal Conjugate-13 09/21/2014  . Pneumococcal Polysaccharide-23 05/09/2002  . Tdap 07/29/2012  . Zoster 07/07/2006  . Zoster Recombinat (Shingrix) 05/27/2017, 07/30/2017   Past Medical History:  Diagnosis Date  . Amputated right leg ([place])   . CAD (coronary artery disease)    a. 09/2017 - acute MI -  emergent cath showing 99% stenosis in midLAD with thrombotic stenosis at the bifurcation of the second diagonal, which was relatively small in diameter. He underwent placement of DES to midLAD which jailed the second diagonal.   . Chronic combined systolic and diastolic heart failure ([place])   . Collagenous colitis    Dr. Festus Holts  . History of ST elevation myocardial infarction (STEMI)    09/2017: LAD  . History of stomach ulcers   . Hyperlipidemia LDL goal <70   . Ischemic cardiomyopathy    Past Surgical History:  Procedure Laterality Date  . BACK SURGERY    . CARDIAC CATHETERIZATION    .  CORONARY STENT INTERVENTION N/A 10/07/2017   Procedure: CORONARY STENT INTERVENTION;  Surgeon: Wellington Hampshire, MD;  Location: [place] [facility];  Service: Cardiovascular;  Laterality: N/A;  . CORONARY/GRAFT ACUTE MI REVASCULARIZATION N/A 10/07/2017   Procedure: Coronary/Graft Acute MI Revascularization;  Surgeon: Wellington Hampshire, MD;  Location: [place] [facility];  Service: Cardiovascular;  Laterality: N/A;  . LEFT HEART CATH AND CORONARY ANGIOGRAPHY N/A 10/07/2017   Procedure: LEFT HEART CATH AND CORONARY ANGIOGRAPHY;  Surgeon: Wellington Hampshire, MD;  Location: [place] [facility];  Service: Cardiovascular;   Laterality: N/A;  . LEG AMPUTATION     Right Below knee  . STOMACH SURGERY     Billroth II gastrojejunostomy  . TOTAL KNEE ARTHROPLASTY     Current Outpatient Medications on File Prior to Visit  Medication Sig Dispense Refill  . ALPRAZolam (XANAX) 0.5 MG tablet Take 1 tablet (0.5 mg total) by mouth at bedtime as needed for sleep. 30 tablet 0  . aspirin 81 MG tablet Take 81 mg by mouth at bedtime.    Marland Kitchen atorvastatin (LIPITOR) 80 MG tablet TAKE 1 TABLET BY MOUTH EVERY DAY AT 6PM (Patient taking differently: Take 80 mg by mouth daily. ) 90 tablet 3  . carvedilol (COREG) 3.125 MG tablet Take 1 tablet (3.125 mg total) by mouth 2 (two) times daily with a meal. 180 tablet 3  . clopidogrel (PLAVIX) 75 MG tablet Take 1 tablet (75 mg total) by mouth daily. 90 tablet 3  . losartan (COZAAR) 25 MG tablet Take 0.5 tablets (12.5 mg total) by mouth daily. 45 tablet 1  . Multiple Vitamins-Minerals (PRESERVISION AREDS PO) Take 1 tablet by mouth 2 (two) times daily.    . nitroGLYCERIN (NITROSTAT) 0.4 MG SL tablet Place 1 tablet (0.4 mg total) under the tongue every 5 (five) minutes x 3 doses as needed for chest pain. 25 tablet 3  . Omega-3 Fatty Acids (FISH OIL PO) Take 1 capsule by mouth at bedtime.     No current facility-administered medications on file prior to visit.    Allergies  Allergen Reactions  . Aleve [Naproxen Sodium] Nausea And Vomiting    No problem with other NSAIDs  . Amoxicillin Other (See Comments)    Did it involve swelling of the face/tongue/throat, SOB, or low BP? No Did it involve sudden or severe rash/hives, skin peeling, or any reaction on the inside of your mouth or nose? No Did you need to seek medical attention at a hospital or doctor's office? unknown When did it last happen?yrs ago dizziness If all above answers are "NO", may proceed with cephalosporin use.   Marland Kitchen Morphine And Related     dizzy  . Penicillins     Diarrhea Did it involve swelling of the  face/tongue/throat, SOB, or low BP? No Did it involve sudden or severe rash/hives, skin peeling, or any reaction on the inside of your mouth or nose? No Did you need to seek medical attention at a hospital or doctor's office? Unknown When did it last happen?years ago If all above answers are "NO", may proceed with cephalosporin use.    Social History   Socioeconomic History  . Marital status: Married    Spouse name: Financial risk analyst  . Number of children: Not on file  . Years of education: 66  . Highest education level: High school graduate  Occupational History  . Occupation: Retired  Scientific laboratory technician  . Financial resource strain: Not on file  . Food insecurity  Worry: Never true    Inability: Never true  . Transportation needs    Medical: No    Non-medical: No  Tobacco Use  . Smoking status: Former Smoker    Packs/day: 1.00    Years: 8.00    Pack years: 8.00    Types: Cigarettes  . Smokeless tobacco: Never Used  Substance and Sexual Activity  . Alcohol use: Yes    Comment: occasional  . Drug use: No  . Sexual activity: Yes    Comment: married  Lifestyle  . Physical activity    Days per week: 0 days    Minutes per session: 0 min  . Stress: Only a little  Relationships  . Social Herbalist on phone: Not on file    Gets together: Not on file    Attends religious service: Not on file    Active member of club or organization: Not on file    Attends meetings of clubs or organizations: Not on file    Relationship status: Not on file  . Intimate partner violence    Fear of current or ex partner: Not on file    Emotionally abused: Not on file    Physically abused: Not on file    Forced sexual activity: Not on file  Other Topics Concern  . Not on file  Social History Narrative  . Not on file   Family History  Problem Relation Age of Onset  . Diabetes Sister   . Diabetes Brother   . Diabetes Sister   . Cancer Sister        breast      Review of  Systems  All other systems reviewed and are negative.      Objective:   Physical Exam  Constitutional: He is oriented to person, place, and time. He appears well-developed and well-nourished. No distress.  HENT:  Head: Normocephalic and atraumatic.  Right Ear: External ear normal.  Left Ear: External ear normal.  Nose: Nose normal.  Mouth/Throat: Oropharynx is clear and moist. No oropharyngeal exudate.  Eyes: Pupils are equal, round, and reactive to light. Conjunctivae and EOM are normal. Right eye exhibits no discharge. Left eye exhibits no discharge. No scleral icterus.  Neck: Normal range of motion. Neck supple. No JVD present. No tracheal deviation present. No thyromegaly present.  Cardiovascular: Normal rate, regular rhythm, normal heart sounds and intact distal pulses. Exam reveals no gallop and no friction rub.  No murmur heard. Pulmonary/Chest: Effort normal and breath sounds normal. No stridor. No respiratory distress. He has no wheezes. He has no rales. He exhibits no tenderness.  Abdominal: Soft. Bowel sounds are normal. He exhibits no distension and no mass. There is no abdominal tenderness. There is no rebound and no guarding.  Musculoskeletal: Normal range of motion.        General: No tenderness or edema.  Lymphadenopathy:    He has no cervical adenopathy.  Neurological: He is alert and oriented to person, place, and time. He has normal reflexes. No cranial nerve deficit. He exhibits normal muscle tone. Coordination normal.  Skin: Skin is warm. No rash noted. He is not diaphoretic. No erythema. No pallor.  Psychiatric: He has a normal mood and affect. His behavior is normal. Judgment and thought content normal.  Vitals reviewed. 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 Examination of his left foot shows normal dorsalis pedis pulses, normal posterior tibialis pulses, normal sensation to  10 g monofilament and no evidence  of ulcers or sores. He does have a dystrophic toenail on his left great toe        Assessment & Plan:  The primary encounter diagnosis was ASCVD (arteriosclerotic cardiovascular disease). Diagnoses of Routine general medical examination at a health care facility, Coronary artery disease involving native coronary artery of native heart without angina pectoris, Hyperlipidemia LDL goal <70, and Microscopic colitis, unspecified microscopic colitis type were also pertinent to this visit. Patient's physical exam today is excellent.  He does have a history of right below the knee amputation however this is chronic.  Overall he is doing very well.  He does have some mild age-related memory impairment however this is very subtle and I do not feel poses any significant problem for him at the present time.  Patient received his flu shot today.  I recommended against prostate cancer screening due to age.  Patient denies any issues with falls or depression.  I will check a CMP and a fasting lipid panel.  Ideally would like his LDL cholesterol to be less than 70.  I did caution the patient to use the Xanax sparingly and also very carefully to avoid habituation and dependency as well as to avoid unintended falls and confusion.  Patient agrees.

## 2018-12-20 DIAGNOSIS — R197 Diarrhea, unspecified: Secondary | ICD-10-CM | POA: Diagnosis not present

## 2018-12-20 DIAGNOSIS — K52831 Collagenous colitis: Secondary | ICD-10-CM | POA: Diagnosis not present

## 2018-12-25 ENCOUNTER — Other Ambulatory Visit: Payer: Self-pay | Admitting: Physician Assistant

## 2019-01-11 ENCOUNTER — Other Ambulatory Visit: Payer: Self-pay | Admitting: Cardiovascular Disease

## 2019-01-27 NOTE — Progress Notes (Signed)
Chief Complaint  Patient presents with  . Follow-up   History of Present Illness: 85 yo male with history of amputation of the right leg due to trauma, CAD, ischemic cardiomyopathy, hyperlipidemia and chronic combined diastolic and systolic CHF here today for cardiac follow up. He was admitted to [facility] in July 2019 with an anterior ST elevation MI. Cardiac cath showed a 99% mid LAD stenosis. A drug eluting stent was placed in the mid LAD. Echo 10/08/17 with LVEF=35-40% with akinesis of the anterior and apical walls. Echo 02/15/18 with LVEF=55-60%, mild AS, mild MR.   He is here today for follow up. The patient denies any chest pain, dyspnea, palpitations, lower extremity edema, orthopnea, PND, dizziness, near syncope or syncope.   Primary Care Physician: Susy Frizzle, MD  Past Medical History:  Diagnosis Date  . Amputated right leg ([place])   . CAD (coronary artery disease)    a. 09/2017 - acute MI -  emergent cath showing 99% stenosis in midLAD with thrombotic stenosis at the bifurcation of the second diagonal, which was relatively small in diameter. He underwent placement of DES to midLAD which jailed the second diagonal.   . Chronic combined systolic and diastolic heart failure ([place])   . Collagenous colitis    Dr. Festus Holts  . History of ST elevation myocardial infarction (STEMI)    09/2017: LAD  . History of stomach ulcers   . Hyperlipidemia LDL goal <70   . Ischemic cardiomyopathy     Past Surgical History:  Procedure Laterality Date  . BACK SURGERY    . CARDIAC CATHETERIZATION    . CORONARY STENT INTERVENTION N/A 10/07/2017   Procedure: CORONARY STENT INTERVENTION;  Surgeon: Wellington Hampshire, MD;  Location: [place] [facility];  Service: Cardiovascular;  Laterality: N/A;  . CORONARY/GRAFT ACUTE MI REVASCULARIZATION N/A 10/07/2017   Procedure: Coronary/Graft Acute MI Revascularization;  Surgeon: Wellington Hampshire, MD;  Location: [place] [facility];  Service: Cardiovascular;   Laterality: N/A;  . LEFT HEART CATH AND CORONARY ANGIOGRAPHY N/A 10/07/2017   Procedure: LEFT HEART CATH AND CORONARY ANGIOGRAPHY;  Surgeon: Wellington Hampshire, MD;  Location: [place] [facility];  Service: Cardiovascular;  Laterality: N/A;  . LEG AMPUTATION     Right Below knee  . STOMACH SURGERY     Billroth II gastrojejunostomy  . TOTAL KNEE ARTHROPLASTY      Current Outpatient Medications  Medication Sig Dispense Refill  . ALPRAZolam (XANAX) 0.5 MG tablet Take 1 tablet (0.5 mg total) by mouth at bedtime as needed for sleep. 30 tablet 0  . aspirin 81 MG tablet Take 81 mg by mouth at bedtime.    Marland Kitchen atorvastatin (LIPITOR) 80 MG tablet TAKE 1 TABLET (80 MG TOTAL) BY MOUTH DAILY AT 6 PM. 90 tablet 1  . carvedilol (COREG) 3.125 MG tablet TAKE 1 TABLET BY MOUTH TWICE A DAY WITH MEALS 180 tablet 1  . losartan (COZAAR) 25 MG tablet TAKE 1/2 TABLET BY MOUTH EVERY DAY 45 tablet 1  . Multiple Vitamins-Minerals (PRESERVISION AREDS PO) Take 1 tablet by mouth 2 (two) times daily.    . nitroGLYCERIN (NITROSTAT) 0.4 MG SL tablet Place 1 tablet (0.4 mg total) under the tongue every 5 (five) minutes x 3 doses as needed for chest pain. 25 tablet 3  . Omega-3 Fatty Acids (FISH OIL PO) Take 1 capsule by mouth at bedtime.     No current facility-administered medications for this visit.     Allergies  Allergen Reactions  .  Aleve [Naproxen Sodium] Nausea And Vomiting    No problem with other NSAIDs  . Amoxicillin Other (See Comments)    Did it involve swelling of the face/tongue/throat, SOB, or low BP? No Did it involve sudden or severe rash/hives, skin peeling, or any reaction on the inside of your mouth or nose? No Did you need to seek medical attention at a hospital or doctor's office? unknown When did it last happen?yrs ago dizziness If all above answers are "NO", may proceed with cephalosporin use.   Marland Kitchen Morphine And Related     dizzy  . Penicillins     Diarrhea Did it involve swelling of the  face/tongue/throat, SOB, or low BP? No Did it involve sudden or severe rash/hives, skin peeling, or any reaction on the inside of your mouth or nose? No Did you need to seek medical attention at a hospital or doctor's office? Unknown When did it last happen?years ago If all above answers are "NO", may proceed with cephalosporin use.     Social History   Socioeconomic History  . Marital status: Married    Spouse name: Financial risk analyst  . Number of children: Not on file  . Years of education: 32  . Highest education level: High school graduate  Occupational History  . Occupation: Retired  Scientific laboratory technician  . Financial resource strain: Not on file  . Food insecurity    Worry: Never true    Inability: Never true  . Transportation needs    Medical: No    Non-medical: No  Tobacco Use  . Smoking status: Former Smoker    Packs/day: 1.00    Years: 8.00    Pack years: 8.00    Types: Cigarettes  . Smokeless tobacco: Never Used  Substance and Sexual Activity  . Alcohol use: Yes    Comment: occasional  . Drug use: No  . Sexual activity: Yes    Comment: married  Lifestyle  . Physical activity    Days per week: 0 days    Minutes per session: 0 min  . Stress: Only a little  Relationships  . Social Herbalist on phone: Not on file    Gets together: Not on file    Attends religious service: Not on file    Active member of club or organization: Not on file    Attends meetings of clubs or organizations: Not on file    Relationship status: Not on file  . Intimate partner violence    Fear of current or ex partner: Not on file    Emotionally abused: Not on file    Physically abused: Not on file    Forced sexual activity: Not on file  Other Topics Concern  . Not on file  Social History Narrative  . Not on file    Family History  Problem Relation Age of Onset  . Diabetes Sister   . Diabetes Brother   . Diabetes Sister   . Cancer Sister        breast    Review of  Systems:  As stated in the HPI and otherwise negative.   BP 122/60   Pulse 63   Ht 5\' 10"  (1.778 m)   Wt 141 lb 12.8 oz (64.3 kg)   SpO2 98%   BMI 20.35 kg/m   Physical Examination: General: Well developed, well nourished, NAD  HEENT: OP clear, mucus membranes moist  SKIN: warm, dry. No rashes. Neuro: No focal deficits  Musculoskeletal: Muscle strength 5/5  all ext  Psychiatric: Mood and affect normal  Neck: No JVD, no carotid bruits, no thyromegaly, no lymphadenopathy.  Lungs:Clear bilaterally, no wheezes, rhonci, crackles Cardiovascular: Regular rate and rhythm. Systolic murmur noted.  Abdomen:Soft. Bowel sounds present. Non-tender.  Extremities: No lower extremity edema.. Left LE prosthesis.   EKG:  EKG is not ordered today. The ekg ordered today demonstrates   Echo 02/15/18: - Left ventricle: The cavity size was normal. Systolic function was   normal. The estimated ejection fraction was in the range of 55%   to 60%. Wall motion was normal; there were no regional wall   motion abnormalities. Doppler parameters are consistent with   abnormal left ventricular relaxation (grade 1 diastolic   dysfunction). Doppler parameters are consistent with high   ventricular filling pressure. - Aortic valve: There was mild stenosis. - Mitral valve: There was mild regurgitation. - Left atrium: The atrium was mildly dilated. - Impressions: EF much improved from previous.  Recent Labs: 10/25/2018: Hemoglobin 13.4; Platelets 277 11/08/2018: ALT 24; BUN 13; Creat 0.78; Potassium 4.3; Sodium 140   Lipid Panel    Component Value Date/Time   CHOL 105 11/08/2018 0954   CHOL 112 11/09/2017 1440   TRIG 47 11/08/2018 0954   HDL 42 11/08/2018 0954   HDL 52 11/09/2017 1440   CHOLHDL 2.5 11/08/2018 0954   VLDL 23 10/07/2017 1628   LDLCALC 50 11/08/2018 0954     Wt Readings from Last 3 Encounters:  01/28/19 141 lb 12.8 oz (64.3 kg)  11/08/18 143 lb (64.9 kg)  10/26/18 144 lb (65.3 kg)      Other studies Reviewed: Additional studies/ records that were reviewed today include:  Review of the above records demonstrates:    Assessment and Plan:   1. CAD without angina: Admitted with anterior STEMI July 2019. A drug eluting stent was placed in the LAD. No chest pain. Continue ASA, statin, beta blocker and ARB. Will stop Plavix. He is having no chest pain.  2. Ischemic cardiomyopathy: LV function normal by echo December 2019. Continue beta blocker and ARB  3. Hyperlipidemia: LDL at goal August 2020. Continue statin.   4. Aortic stenosis: Mild by echo December 2019. Will repeat echo December 2021.   Current medicines are reviewed at length with the patient today.  The patient does not have concerns regarding medicines.  The following changes have been made:  no change  Labs/ tests ordered today include:   No orders of the defined types were placed in this encounter.    Disposition:   FU with me in 12 months.    Signed, Lauree Chandler, MD 01/28/2019 9:50 AM    [facility], [place], [place]  [address] Phone: [phone]; Fax: [phone]

## 2019-01-28 ENCOUNTER — Ambulatory Visit: Payer: PPO | Admitting: Cardiovascular Disease

## 2019-01-28 ENCOUNTER — Other Ambulatory Visit: Payer: Self-pay

## 2019-01-28 ENCOUNTER — Encounter: Payer: Self-pay | Admitting: Cardiovascular Disease

## 2019-01-28 VITALS — BP 122/60 | HR 63 | Ht 70.0 in | Wt 141.8 lb

## 2019-01-28 DIAGNOSIS — I255 Ischemic cardiomyopathy: Secondary | ICD-10-CM

## 2019-01-28 DIAGNOSIS — I2510 Atherosclerotic heart disease of native coronary artery without angina pectoris: Secondary | ICD-10-CM | POA: Diagnosis not present

## 2019-01-28 DIAGNOSIS — E7800 Pure hypercholesterolemia, unspecified: Secondary | ICD-10-CM | POA: Diagnosis not present

## 2019-01-28 DIAGNOSIS — I350 Nonrheumatic aortic (valve) stenosis: Secondary | ICD-10-CM | POA: Diagnosis not present

## 2019-01-28 NOTE — Patient Instructions (Signed)
Medication Instructions:  1) STOP PLAVIX *If you need a refill on your cardiac medications before your next appointment, please call your pharmacy*  Follow-Up: At [facility], you and your health needs are our priority.  As part of our continuing mission to provide you with exceptional heart care, we have created designated Provider Care Teams.  These Care Teams include your primary Cardiologist (physician) and Advanced Practice Providers (APPs -  Physician Assistants and Nurse Practitioners) who all work together to provide you with the care you need, when you need it. Your next appointment:   12 months The format for your next appointment:   In Person Provider:   You may see Lauree Chandler, MD or one of the following Advanced Practice Providers on your designated Care Team:    Melina Copa, PA-C  Ermalinda Barrios, PA-C

## 2019-03-15 ENCOUNTER — Other Ambulatory Visit: Payer: Self-pay | Admitting: Family Medicine

## 2019-03-15 NOTE — Telephone Encounter (Signed)
Last refilled: 10/26/2018 Last office visit: 11/08/2018

## 2019-03-25 ENCOUNTER — Other Ambulatory Visit: Payer: PPO

## 2019-04-07 ENCOUNTER — Other Ambulatory Visit: Payer: Self-pay | Admitting: Family Medicine

## 2019-04-07 ENCOUNTER — Other Ambulatory Visit: Payer: Self-pay | Admitting: Physician Assistant

## 2019-04-08 ENCOUNTER — Ambulatory Visit: Payer: PPO | Attending: Internal Medicine

## 2019-04-08 DIAGNOSIS — Z23 Encounter for immunization: Secondary | ICD-10-CM | POA: Insufficient documentation

## 2019-04-08 NOTE — Progress Notes (Signed)
   Covid-19 Vaccination Clinic  Name:  Eugene [name]    MRN: [id number] DOB: 23-Nov-1931  04/08/2019  Eugene [name] was observed post Covid-19 immunization for 15 minutes without incidence. He was provided with Vaccine Information Sheet and instruction to access the V-Safe system.   Eugene [name] was instructed to call 911 with any severe reactions post vaccine: Marland Kitchen Difficulty breathing  . Swelling of your face and throat  . A fast heartbeat  . A bad rash all over your body  . Dizziness and weakness    Immunizations Administered    Name Date Dose VIS Date Route   Pfizer COVID-19 Vaccine 04/08/2019  9:30 AM 0.3 mL 02/25/2019 Intramuscular   Manufacturer: [place]   Lot: [id number]   [place]: [id number]

## 2019-04-11 DIAGNOSIS — H35412 Lattice degeneration of retina, left eye: Secondary | ICD-10-CM | POA: Diagnosis not present

## 2019-04-11 DIAGNOSIS — H43813 Vitreous degeneration, bilateral: Secondary | ICD-10-CM | POA: Diagnosis not present

## 2019-04-11 DIAGNOSIS — H35371 Puckering of macula, right eye: Secondary | ICD-10-CM | POA: Diagnosis not present

## 2019-04-11 DIAGNOSIS — H353132 Nonexudative age-related macular degeneration, bilateral, intermediate dry stage: Secondary | ICD-10-CM | POA: Diagnosis not present

## 2019-04-14 ENCOUNTER — Other Ambulatory Visit: Payer: Self-pay | Admitting: Physician Assistant

## 2019-04-14 DIAGNOSIS — D485 Neoplasm of uncertain behavior of skin: Secondary | ICD-10-CM | POA: Diagnosis not present

## 2019-04-14 DIAGNOSIS — L821 Other seborrheic keratosis: Secondary | ICD-10-CM | POA: Diagnosis not present

## 2019-04-14 DIAGNOSIS — L570 Actinic keratosis: Secondary | ICD-10-CM | POA: Diagnosis not present

## 2019-04-28 ENCOUNTER — Ambulatory Visit: Payer: PPO | Attending: Internal Medicine

## 2019-04-28 DIAGNOSIS — Z23 Encounter for immunization: Secondary | ICD-10-CM | POA: Insufficient documentation

## 2019-04-28 NOTE — Progress Notes (Signed)
   Covid-19 Vaccination Clinic  Name:  Eugene [name]    MRN: [id number] DOB: 01/20/1932  04/28/2019  Mr. Mizer was observed post Covid-19 immunization for 15 minutes without incidence. He was provided with Vaccine Information Sheet and instruction to access the V-Safe system.   Mr. Angleton was instructed to call 911 with any severe reactions post vaccine: Marland Kitchen Difficulty breathing  . Swelling of your face and throat  . A fast heartbeat  . A bad rash all over your body  . Dizziness and weakness    Immunizations Administered    Name Date Dose VIS Date Route   Pfizer COVID-19 Vaccine 04/28/2019  9:31 AM 0.3 mL 02/25/2019 Intramuscular   Manufacturer: [place]   Lot: [id number]   [place]: [id number]

## 2019-05-30 ENCOUNTER — Other Ambulatory Visit: Payer: Self-pay

## 2019-05-30 ENCOUNTER — Encounter: Payer: Self-pay | Admitting: Family Medicine

## 2019-05-30 ENCOUNTER — Ambulatory Visit (INDEPENDENT_AMBULATORY_CARE_PROVIDER_SITE_OTHER): Payer: PPO | Admitting: Family Medicine

## 2019-05-30 VITALS — BP 136/60 | HR 64 | Temp 97.8°F | Resp 16 | Ht 70.0 in | Wt 148.0 lb

## 2019-05-30 DIAGNOSIS — F5101 Primary insomnia: Secondary | ICD-10-CM

## 2019-05-30 DIAGNOSIS — R0789 Other chest pain: Secondary | ICD-10-CM | POA: Diagnosis not present

## 2019-05-30 DIAGNOSIS — I2510 Atherosclerotic heart disease of native coronary artery without angina pectoris: Secondary | ICD-10-CM

## 2019-05-30 NOTE — Progress Notes (Signed)
Subjective:    Patient ID: Eugene [name], male    DOB: 23-Feb-1932, 86 y.o.   MRN: [id number]  HPI  10/26/18 Patient has a history of coronary artery disease and was admitted to the hospital July 2019 with an acute myocardial infarction.  He was found to have a 99% stenosis in the mid LAD and underwent drug-eluting stent placement to the mid LAD.  He has been on dual antiplatelet therapy ever since.  Today he went to the emergency room with atypical chest pain.  Patient states that the pain began approximately 3 days ago.  It was located in his left upper chest near his axilla.  It was not related to exertion.  It did not improve with rest.  It was constant and mild.  He states that it is worse it was a 4 out of 10.  After persisting 3 days, he went to the emergency room due to concern that it may be his heart.  In the emergency room he had delta troponins negative x2.  He also had a normal chest x-ray.  He was discharged home with atypical chest pain.  He is here today for follow-up.  He states that the chest pain has resolved and has completely gone away.  His biggest concern today is insomnia.  He states that he is just not sleeping well.  He goes to bed every night and has a difficult time falling asleep.  When he does fall asleep, he awakens easily and then cannot get back to sleep.  He denies any depression.  He denies any anxiety.  He denies any pain keeping him awake.  AT that time, my plan was: Chest pain sounds atypical.  I do not believe his pain is cardiac in origin.  I suspect it was most likely muscular.  It is since resolved.  Therefore if his chest pain remains absent I feel no further work-up is necessary.  We discussed at length treating his insomnia.  We discussed trying trazodone or doxepin for nonaddictive options.  However I am concerned about anticholinergic side effects in the elderly.  We also discussed addictive options such as Xanax or Ativan.  Although addictive, the patient would  use this sparingly and he has taken Xanax before in the past without difficulty.  My biggest concern is the risk of falls.  I explained to the patient I want him to be very careful with this medication and use Xanax sparingly.  If it affects his balance in any way on him but discontinue the medication to avoid an unintended accident.  I will give him Xanax 0.5 mg p.o. nightly as needed insomnia.  He agrees.  05/30/19 Patient presents today with a 4 to 5-day history of chest pain.  The pain is extremely atypical.  First is located to the lower left side of his chest.  It is approximately 1 rib below his nipple and towards the axillary line.  The pain is not brought on by activity.  It also occurs at rest.  There is no relationship to exertion.  He denies any shortness of breath.  He denies any pressure in the center of his chest.  He denies any trouble breathing.  He denies any pleurisy.  He denies any hemoptysis.  He denies any nausea or vomiting.  The pain does not radiate into his left arm.  EKG today shows normal sinus rhythm with normal intervals and a normal axis with no evidence of ischemia or infarction.  The patient does have T wave inversion in lead III however this was present on EKG in August 2019.  Patient is comfortable sitting on the exam room table today.  He denies any discomfort.  In fact he is also concerned about getting the prosthetic liner for his right leg prosthesis.  He also reports that he is having a difficult time sleeping as his mind keeps him awake at night.  Blood pressure today is outstanding at 136/60.  Patient has similar chest pain last fall that second to the emergency room.  At that time the pain was mild.  It was a 4 out of 10.  It was constant and unrelated to exertion.  This is similar. Past Medical History:  Diagnosis Date  . Amputated right leg ([place])   . CAD (coronary artery disease)    a. 09/2017 - acute MI -  emergent cath showing 99% stenosis in midLAD with  thrombotic stenosis at the bifurcation of the second diagonal, which was relatively small in diameter. He underwent placement of DES to midLAD which jailed the second diagonal.   . Chronic combined systolic and diastolic heart failure ([place])   . Collagenous colitis    Dr. Festus Holts  . History of ST elevation myocardial infarction (STEMI)    09/2017: LAD  . History of stomach ulcers   . Hyperlipidemia LDL goal <70   . Ischemic cardiomyopathy    Past Surgical History:  Procedure Laterality Date  . BACK SURGERY    . CARDIAC CATHETERIZATION    . CORONARY STENT INTERVENTION N/A 10/07/2017   Procedure: CORONARY STENT INTERVENTION;  Surgeon: Wellington Hampshire, MD;  Location: [facility];  Service: Cardiovascular;  Laterality: N/A;  . CORONARY/GRAFT ACUTE MI REVASCULARIZATION N/A 10/07/2017   Procedure: Coronary/Graft Acute MI Revascularization;  Surgeon: Wellington Hampshire, MD;  Location: [facility];  Service: Cardiovascular;  Laterality: N/A;  . LEFT HEART CATH AND CORONARY ANGIOGRAPHY N/A 10/07/2017   Procedure: LEFT HEART CATH AND CORONARY ANGIOGRAPHY;  Surgeon: Wellington Hampshire, MD;  Location: [facility];  Service: Cardiovascular;  Laterality: N/A;  . LEG AMPUTATION     Right Below knee  . STOMACH SURGERY     Billroth II gastrojejunostomy  . TOTAL KNEE ARTHROPLASTY     Current Outpatient Medications on File Prior to Visit  Medication Sig Dispense Refill  . ALPRAZolam (XANAX) 0.5 MG tablet TAKE 1 TABLET BY MOUTH AT BEDTIME AS NEEDED FOR SLEEP. 30 tablet 0  . aspirin 81 MG tablet Take 81 mg by mouth at bedtime.    Marland Kitchen atorvastatin (LIPITOR) 80 MG tablet TAKE 1 TABLET (80 MG TOTAL) BY MOUTH DAILY AT 6 PM. 90 tablet 3  . carvedilol (COREG) 3.125 MG tablet TAKE 1 TABLET BY MOUTH TWICE A DAY WITH MEALS 180 tablet 1  . cholestyramine (QUESTRAN) 4 GM/DOSE powder USE 1 SCOOP 3 TIMES A DAY WITH A MEAL 756 g 6  . losartan (COZAAR) 25 MG tablet TAKE 1/2 TABLET BY MOUTH EVERY DAY  45 tablet 1  . Multiple Vitamins-Minerals (PRESERVISION AREDS PO) Take 1 tablet by mouth 2 (two) times daily.    . nitroGLYCERIN (NITROSTAT) 0.4 MG SL tablet Place 1 tablet (0.4 mg total) under the tongue every 5 (five) minutes x 3 doses as needed for chest pain. 25 tablet 3  . Omega-3 Fatty Acids (FISH OIL PO) Take 1 capsule by mouth at bedtime.     No current facility-administered medications on file prior to  visit.   Allergies  Allergen Reactions  . Aleve [Naproxen Sodium] Nausea And Vomiting    No problem with other NSAIDs  . Amoxicillin Other (See Comments)    Did it involve swelling of the face/tongue/throat, SOB, or low BP? No Did it involve sudden or severe rash/hives, skin peeling, or any reaction on the inside of your mouth or nose? No Did you need to seek medical attention at a hospital or doctor's office? unknown When did it last happen?yrs ago dizziness If all above answers are "NO", may proceed with cephalosporin use.   Marland Kitchen Morphine And Related     dizzy  . Penicillins     Diarrhea Did it involve swelling of the face/tongue/throat, SOB, or low BP? No Did it involve sudden or severe rash/hives, skin peeling, or any reaction on the inside of your mouth or nose? No Did you need to seek medical attention at a hospital or doctor's office? Unknown When did it last happen?years ago If all above answers are "NO", may proceed with cephalosporin use.    Social History   Socioeconomic History  . Marital status: Married    Spouse name: Financial risk analyst  . Number of children: Not on file  . Years of education: 66  . Highest education level: High school graduate  Occupational History  . Occupation: Retired  Tobacco Use  . Smoking status: Former Smoker    Packs/day: 1.00    Years: 8.00    Pack years: 8.00    Types: Cigarettes  . Smokeless tobacco: Never Used  Substance and Sexual Activity  . Alcohol use: Yes    Comment: occasional  . Drug use: No  . Sexual activity:  Yes    Comment: married  Other Topics Concern  . Not on file  Social History Narrative  . Not on file   Social Determinants of Health   Financial Resource Strain:   . Difficulty of Paying Living Expenses:   Food Insecurity:   . Worried About Charity fundraiser in the Last Year:   . Arboriculturist in the Last Year:   Transportation Needs:   . Film/video editor (Medical):   Marland Kitchen Lack of Transportation (Non-Medical):   Physical Activity:   . Days of Exercise per Week:   . Minutes of Exercise per Session:   Stress:   . Feeling of Stress :   Social Connections:   . Frequency of Communication with Friends and Family:   . Frequency of Social Gatherings with Friends and Family:   . Attends Religious Services:   . Active Member of Clubs or Organizations:   . Attends Archivist Meetings:   Marland Kitchen Marital Status:   Intimate Partner Violence:   . Fear of Current or Ex-Partner:   . Emotionally Abused:   Marland Kitchen Physically Abused:   . Sexually Abused:     Review of Systems  All other systems reviewed and are negative.      Objective:   Physical Exam  Constitutional: He appears well-developed and well-nourished. No distress.  Neck: No JVD present.  Cardiovascular: Normal rate and regular rhythm. Exam reveals no friction rub.  Murmur heard. Pulmonary/Chest: Effort normal and breath sounds normal. No respiratory distress. He has no wheezes. He has no rales. He exhibits no tenderness.    Abdominal: Soft. Bowel sounds are normal. He exhibits no distension. There is no abdominal tenderness. There is no rebound and no guarding.  Musculoskeletal:  General: No deformity or edema.     Cervical back: Neck supple.  Skin: He is not diaphoretic.  Vitals reviewed.         Assessment & Plan:  Atypical chest pain - Plan: EKG 12-Lead  ASCVD (arteriosclerotic cardiovascular disease)  Primary insomnia  This is the second time the patient has seen me for atypical chest pain  in the last 6 to 7 months.  Therefore I would like to schedule a follow-up appoint with his cardiologist for second opinion however I believe most likely this is musculoskeletal in nature.  I gave the patient warning signs when to go to the emergency room when to seek medical attention however at the present time I believe this is most likely muscular and will resolve on its own.  I will call his cardiologist to make a follow-up appointment for him.  He does have a history of mild aortic stenosis and his murmur sounds louder today based on my opinion and therefore I believe he is due for an echocardiogram to monitor this.  He can continue to use Xanax 0.5 mg tablets 1/2 to 1 tablet every night as needed for insomnia.  I cautioned the patient about confusion and falls related to the medication.  Both the patient and his daughter who accompanied him are comfortable with this plan.  He is to go to the emergency room immediately if the chest pain intensifies or he develops an exertional component to the chest pain suggesting unstable angina

## 2019-06-08 ENCOUNTER — Other Ambulatory Visit: Payer: Self-pay | Admitting: Family Medicine

## 2019-06-09 NOTE — Telephone Encounter (Signed)
Ok to refill??  Last office visit 05/30/2019.  Last refill 03/15/2019.

## 2019-06-19 ENCOUNTER — Other Ambulatory Visit: Payer: Self-pay | Admitting: Physician Assistant

## 2019-07-02 ENCOUNTER — Other Ambulatory Visit: Payer: Self-pay | Admitting: Cardiovascular Disease

## 2019-07-20 ENCOUNTER — Telehealth: Payer: Self-pay | Admitting: Family Medicine

## 2019-07-20 NOTE — Progress Notes (Signed)
Chief Complaint  Patient presents with  . Follow-up    CAD   History of Present Illness: 86 yo male with history of amputation of the right leg due to trauma, CAD, ischemic cardiomyopathy, hyperlipidemia and chronic combined diastolic and systolic CHF here today for cardiac follow up. He was admitted to [facility] in July 2019 with an anterior ST elevation MI. Cardiac cath showed a 99% mid LAD stenosis. A drug eluting stent was placed in the mid LAD. Echo 10/08/17 with LVEF=35-40% with akinesis of the anterior and apical walls. Echo 02/15/18 with LVEF=55-60%, mild AS, mild MR.   He is here today for follow up. The patient denies any chest pain, dyspnea, palpitations, lower extremity edema, orthopnea, PND, dizziness, near syncope or syncope.   Primary Care Physician: Susy Frizzle, MD  Past Medical History:  Diagnosis Date  . Amputated right leg ([place])   . CAD (coronary artery disease)    a. 09/2017 - acute MI -  emergent cath showing 99% stenosis in midLAD with thrombotic stenosis at the bifurcation of the second diagonal, which was relatively small in diameter. He underwent placement of DES to midLAD which jailed the second diagonal.   . Chronic combined systolic and diastolic heart failure ([place])   . Collagenous colitis    Dr. Festus Holts  . History of ST elevation myocardial infarction (STEMI)    09/2017: LAD  . History of stomach ulcers   . Hyperlipidemia LDL goal <70   . Ischemic cardiomyopathy     Past Surgical History:  Procedure Laterality Date  . BACK SURGERY    . CARDIAC CATHETERIZATION    . CORONARY STENT INTERVENTION N/A 10/07/2017   Procedure: CORONARY STENT INTERVENTION;  Surgeon: Wellington Hampshire, MD;  Location: [facility];  Service: Cardiovascular;  Laterality: N/A;  . CORONARY/GRAFT ACUTE MI REVASCULARIZATION N/A 10/07/2017   Procedure: Coronary/Graft Acute MI Revascularization;  Surgeon: Wellington Hampshire, MD;  Location: [facility];  Service:  Cardiovascular;  Laterality: N/A;  . LEFT HEART CATH AND CORONARY ANGIOGRAPHY N/A 10/07/2017   Procedure: LEFT HEART CATH AND CORONARY ANGIOGRAPHY;  Surgeon: Wellington Hampshire, MD;  Location: [facility];  Service: Cardiovascular;  Laterality: N/A;  . LEG AMPUTATION     Right Below knee  . STOMACH SURGERY     Billroth II gastrojejunostomy  . TOTAL KNEE ARTHROPLASTY      Current Outpatient Medications  Medication Sig Dispense Refill  . ALPRAZolam (XANAX) 0.5 MG tablet TAKE 1 TABLET BY MOUTH EVERY DAY AT BEDTIME AS NEEDED FOR SLEEP 30 tablet 0  . aspirin 81 MG tablet Take 81 mg by mouth at bedtime.    Marland Kitchen atorvastatin (LIPITOR) 80 MG tablet TAKE 1 TABLET (80 MG TOTAL) BY MOUTH DAILY AT 6 PM. 90 tablet 3  . carvedilol (COREG) 3.125 MG tablet TAKE 1 TABLET BY MOUTH TWICE A DAY WITH MEALS 180 tablet 1  . cholestyramine (QUESTRAN) 4 GM/DOSE powder USE 1 SCOOP 3 TIMES A DAY WITH A MEAL 756 g 6  . losartan (COZAAR) 25 MG tablet TAKE 1/2 TABLET BY MOUTH EVERY DAY 45 tablet 2  . Multiple Vitamins-Minerals (PRESERVISION AREDS PO) Take 1 tablet by mouth 2 (two) times daily.    . nitroGLYCERIN (NITROSTAT) 0.4 MG SL tablet Place 1 tablet (0.4 mg total) under the tongue every 5 (five) minutes x 3 doses as needed for chest pain. 25 tablet 3  . Omega-3 Fatty Acids (FISH OIL PO) Take 1 capsule  by mouth at bedtime.     No current facility-administered medications for this visit.    Allergies  Allergen Reactions  . Aleve [Naproxen Sodium] Nausea And Vomiting    No problem with other NSAIDs  . Amoxicillin Other (See Comments)    Did it involve swelling of the face/tongue/throat, SOB, or low BP? No Did it involve sudden or severe rash/hives, skin peeling, or any reaction on the inside of your mouth or nose? No Did you need to seek medical attention at a hospital or doctor's office? unknown When did it last happen?yrs ago dizziness If all above answers are "NO", may proceed with cephalosporin  use.   Marland Kitchen Morphine And Related     dizzy  . Penicillins     Diarrhea Did it involve swelling of the face/tongue/throat, SOB, or low BP? No Did it involve sudden or severe rash/hives, skin peeling, or any reaction on the inside of your mouth or nose? No Did you need to seek medical attention at a hospital or doctor's office? Unknown When did it last happen?years ago If all above answers are "NO", may proceed with cephalosporin use.     Social History   Socioeconomic History  . Marital status: Married    Spouse name: Financial risk analyst  . Number of children: Not on file  . Years of education: 19  . Highest education level: High school graduate  Occupational History  . Occupation: Retired  Tobacco Use  . Smoking status: Former Smoker    Packs/day: 1.00    Years: 8.00    Pack years: 8.00    Types: Cigarettes  . Smokeless tobacco: Never Used  Substance and Sexual Activity  . Alcohol use: Yes    Comment: occasional  . Drug use: No  . Sexual activity: Yes    Comment: married  Other Topics Concern  . Not on file  Social History Narrative  . Not on file   Social Determinants of Health   Financial Resource Strain:   . Difficulty of Paying Living Expenses:   Food Insecurity:   . Worried About Charity fundraiser in the Last Year:   . Arboriculturist in the Last Year:   Transportation Needs:   . Film/video editor (Medical):   Marland Kitchen Lack of Transportation (Non-Medical):   Physical Activity:   . Days of Exercise per Week:   . Minutes of Exercise per Session:   Stress:   . Feeling of Stress :   Social Connections:   . Frequency of Communication with Friends and Family:   . Frequency of Social Gatherings with Friends and Family:   . Attends Religious Services:   . Active Member of Clubs or Organizations:   . Attends Archivist Meetings:   Marland Kitchen Marital Status:   Intimate Partner Violence:   . Fear of Current or Ex-Partner:   . Emotionally Abused:   Marland Kitchen Physically  Abused:   . Sexually Abused:     Family History  Problem Relation Age of Onset  . Diabetes Sister   . Diabetes Brother   . Diabetes Sister   . Cancer Sister        breast    Review of Systems:  As stated in the HPI and otherwise negative.   BP 108/60   Pulse 65   Ht 5\' 10"  (1.778 m)   Wt 162 lb (73.5 kg)   SpO2 97%   BMI 23.24 kg/m   Physical Examination:  General: Well developed,  well nourished, NAD  HEENT: OP clear, mucus membranes moist  SKIN: warm, dry. No rashes. Neuro: No focal deficits  Musculoskeletal: Muscle strength 5/5 all ext  Psychiatric: Mood and affect normal  Neck: No JVD, no carotid bruits, no thyromegaly, no lymphadenopathy.  Lungs:Clear bilaterally, no wheezes, rhonci, crackles Cardiovascular: Regular rate and rhythm. No murmurs, gallops or rubs. Abdomen:Soft. Bowel sounds present. Non-tender.  Extremities: LLE prosthesis. No right LE edema.   EKG:  EKG is not ordered today. The ekg ordered today demonstrates   Echo 02/15/18: - Left ventricle: The cavity size was normal. Systolic function was   normal. The estimated ejection fraction was in the range of 55%   to 60%. Wall motion was normal; there were no regional wall   motion abnormalities. Doppler parameters are consistent with   abnormal left ventricular relaxation (grade 1 diastolic   dysfunction). Doppler parameters are consistent with high   ventricular filling pressure. - Aortic valve: There was mild stenosis. - Mitral valve: There was mild regurgitation. - Left atrium: The atrium was mildly dilated. - Impressions: EF much improved from previous.  Recent Labs: 10/25/2018: Hemoglobin 13.4; Platelets 277 11/08/2018: ALT 24; BUN 13; Creat 0.78; Potassium 4.3; Sodium 140   Lipid Panel    Component Value Date/Time   CHOL 105 11/08/2018 0954   CHOL 112 11/09/2017 1440   TRIG 47 11/08/2018 0954   HDL 42 11/08/2018 0954   HDL 52 11/09/2017 1440   CHOLHDL 2.5 11/08/2018 0954   VLDL 23  10/07/2017 1628   LDLCALC 50 11/08/2018 0954     Wt Readings from Last 3 Encounters:  07/21/19 162 lb (73.5 kg)  05/30/19 148 lb (67.1 kg)  01/28/19 141 lb 12.8 oz (64.3 kg)   Other studies Reviewed: Additional studies/ records that were reviewed today include:  Review of the above records demonstrates:   Assessment and Plan:   1. CAD without angina: Admitted with anterior STEMI July 2019. A drug eluting stent was placed in the LAD. He has no chest pain. Will continue ASA, beta blocker and statin.   2. Ischemic cardiomyopathy: LV function normal by echo December 2019. Continue beta blocker and ARB.   3. Hyperlipidemia: LDL at goal August 2020. Continue statin.   4. Aortic stenosis: Mild by echo December 2019. Repeat echo in December 2021.   Current medicines are reviewed at length with the patient today.  The patient does not have concerns regarding medicines.  The following changes have been made:  no change  Labs/ tests ordered today include:   Orders Placed This Encounter  Procedures  . AMB referral to Cardiac Rehabilitation Maintenance Program ([place] Only)  . ECHOCARDIOGRAM COMPLETE   Disposition:   FU with me in 12 months.    Signed, Lauree Chandler, MD 07/21/2019 12:23 PM    [facility] [place], [place],   [address] Phone: [phone]; Fax: [phone]

## 2019-07-20 NOTE — Chronic Care Management (AMB) (Signed)
  Chronic Care Management   Note  07/20/2019 Name: IRINEO DERRING MRN: [id number] DOB: 02-08-1932  KREIGHTON BEANS is a 86 y.o. year old male who is a primary care patient of Susy Frizzle, MD. I reached out to [name] by phone today in response to a referral sent by Mr. Tolulope Lucky Guin's PCP, Susy Frizzle, MD.   Mr. Gantner was given information about Chronic Care Management services today including:  1. CCM service includes personalized support from designated clinical staff supervised by his physician, including individualized plan of care and coordination with other care providers 2. 24/7 contact phone numbers for assistance for urgent and routine care needs. 3. Service will only be billed when office clinical staff spend 20 minutes or more in a month to coordinate care. 4. Only one practitioner may furnish and bill the service in a calendar month. 5. The patient may stop CCM services at any time (effective at the end of the month) by phone call to the office staff.   Patient agreed to services and verbal consent obtained.   Follow up plan:   [place]

## 2019-07-21 ENCOUNTER — Other Ambulatory Visit: Payer: Self-pay

## 2019-07-21 ENCOUNTER — Ambulatory Visit: Payer: PPO | Admitting: Cardiovascular Disease

## 2019-07-21 ENCOUNTER — Encounter: Payer: Self-pay | Admitting: Cardiovascular Disease

## 2019-07-21 VITALS — BP 108/60 | HR 65 | Ht 70.0 in | Wt 162.0 lb

## 2019-07-21 DIAGNOSIS — I350 Nonrheumatic aortic (valve) stenosis: Secondary | ICD-10-CM

## 2019-07-21 DIAGNOSIS — I2510 Atherosclerotic heart disease of native coronary artery without angina pectoris: Secondary | ICD-10-CM | POA: Diagnosis not present

## 2019-07-21 DIAGNOSIS — I255 Ischemic cardiomyopathy: Secondary | ICD-10-CM

## 2019-07-21 DIAGNOSIS — E7800 Pure hypercholesterolemia, unspecified: Secondary | ICD-10-CM | POA: Diagnosis not present

## 2019-07-21 NOTE — Patient Instructions (Addendum)
Medication Instructions:  *If you need a refill on your cardiac medications before your next appointment, please call your pharmacy*  Lab Work: If you have labs (blood work) drawn today and your tests are completely normal, you will receive your results only by: Marland Kitchen MyChart Message (if you have MyChart) OR . A paper copy in the mail If you have any lab test that is abnormal or we need to change your treatment, we will call you to review the results.  Testing/Procedures: Your physician has requested that you have an echocardiogram in December. Echocardiography is a painless test that uses sound waves to create images of your heart. It provides your doctor with information about the size and shape of your heart and how well your heart's chambers and valves are working. This procedure takes approximately one hour. There are no restrictions for this procedure.  Follow-Up: At [facility], you and your health needs are our priority.  As part of our continuing mission to provide you with exceptional heart care, we have created designated Provider Care Teams.  These Care Teams include your primary Cardiologist (physician) and Advanced Practice Providers (APPs -  Physician Assistants and Nurse Practitioners) who all work together to provide you with the care you need, when you need it.  We recommend signing up for the patient portal called "MyChart".  Sign up information is provided on this After Visit Summary.  MyChart is used to connect with patients for Virtual Visits (Telemedicine).  Patients are able to view lab/test results, encounter notes, upcoming appointments, etc.  Non-urgent messages can be sent to your provider as well.   To learn more about what you can do with MyChart, go to [email or web address].    Your next appointment:   12 month(s)  The format for your next appointment:   In Person  Provider:   You may see Lauree Chandler, MD or one of the following Advanced Practice  Providers on your designated Care Team:    Melina Copa, PA-C  Ermalinda Barrios, PA-C  You have been referred to Cardiac Rehab Maintaince Program.

## 2019-08-08 ENCOUNTER — Other Ambulatory Visit: Payer: Self-pay | Admitting: Family Medicine

## 2019-08-08 DIAGNOSIS — E785 Hyperlipidemia, unspecified: Secondary | ICD-10-CM

## 2019-08-08 DIAGNOSIS — I255 Ischemic cardiomyopathy: Secondary | ICD-10-CM

## 2019-08-08 DIAGNOSIS — I2510 Atherosclerotic heart disease of native coronary artery without angina pectoris: Secondary | ICD-10-CM

## 2019-08-08 NOTE — Chronic Care Management (AMB) (Addendum)
Chronic Care Management Pharmacy  Name: Eugene [name]  MRN: [id number] DOB: 1931-06-24  Chief Complaint/ HPI  Eugene [name],  86 y.o. , male presents for their Initial CCM visit with the clinical pharmacist In office.  PCP : Susy Frizzle, MD  Their chronic conditions include: CAD, Systolic/diastolic HF, Hyperlipidemia, anxiety.  Office Visits:  05/30/2019 ([place]) - chest pain, supposed to be musculoskeletal in nature, continued Xanax 0.5mg  for insomnia, given instruction on when to go to ER  Consult Visit:  07/21/2019 [facility], Cardiology) - follow up appt, no med changes, repeat echo due soon, 2019 showed mild aortic stenosis, had MI in July 2019, denies chest pain, dyspnea, edema, etc.  Medications: Outpatient Encounter Medications as of 08/09/2019  Medication Sig   ALPRAZolam (XANAX) 0.5 MG tablet TAKE 1 TABLET BY MOUTH EVERY DAY AT BEDTIME AS NEEDED FOR SLEEP   aspirin 81 MG tablet Take 81 mg by mouth at bedtime.   atorvastatin (LIPITOR) 80 MG tablet TAKE 1 TABLET (80 MG TOTAL) BY MOUTH DAILY AT 6 PM.   carvedilol (COREG) 3.125 MG tablet TAKE 1 TABLET BY MOUTH TWICE A DAY WITH MEALS   cholestyramine (QUESTRAN) 4 GM/DOSE powder USE 1 SCOOP 3 TIMES A DAY WITH A MEAL   losartan (COZAAR) 25 MG tablet TAKE 1/2 TABLET BY MOUTH EVERY DAY   Multiple Vitamins-Minerals (PRESERVISION AREDS PO) Take 1 tablet by mouth 2 (two) times daily.   nitroGLYCERIN (NITROSTAT) 0.4 MG SL tablet Place 1 tablet (0.4 mg total) under the tongue every 5 (five) minutes x 3 doses as needed for chest pain.   Omega-3 Fatty Acids (FISH OIL PO) Take 1 capsule by mouth at bedtime.   No facility-administered encounter medications on file as of 08/09/2019.     Current Diagnosis/Assessment:    [employer]: Low Risk    Difficulty of Paying Living Expenses: Not hard at all     Goals Addressed             This Visit's Progress    Pharmacy Care Plan:       CARE PLAN  ENTRY  Current Barriers:  Chronic Disease Management support, education, and care coordination needs related to Hyperlipidemia and Heart Failure   Hyperlipidemia Pharmacist Clinical Goal(s): Over the next 180 days, patient will work with PharmD and providers to maintain LDL goal < 70 Current regimen:  Atorvastatin 80mg  Interventions: Comprehensive medication review Counseled on importance of low LDL with cardiovascular history Patient self care activities - Over the next 180 days, patient will: Continue to take medication as prescribed Contact PharmD or PCP with any medication related health concerns  Heart Failure Pharmacist Clinical Goal(s) Over the next 180 days, patient will work with PharmD and providers to optimize medication related to HF. Current regimen:  Carvedilol 3.125 mg twice daily Losartan 25 mg daily Interventions: Comprehensive medication review Continue current therapy Patient self care activities - Over the next 180 days, patient will: Weigh themselves as directed by cardiologist Contact PharmD or PCP you gain more than 3 pounds in one day or 5 pounds in one week.  Initial goal documentation         Heart Failure   Type: Combined Systolic and Diastolic  Last ejection fraction: 55% NYHA Class: II (slight limitation of activity) AHA HF Stage: B (Heart disease present - no symptoms present)  Patient has failed these meds in past: metoprolol succinate 25mg  daily Patient is currently controlled on the following medications: carvedilol 3.125 mg  twice daily with meals, losartan 25mg   We discussed weighing daily; if you gain more than 3 pounds in one day or 5 pounds in one week call your doctor. No signs of swelling currently. Plan  Continue current medications   Hyperlipidemia   Lipid Panel     Component Value Date/Time   CHOL 105 11/08/2018 0954   CHOL 112 11/09/2017 1440   TRIG 47 11/08/2018 0954   HDL 42 11/08/2018 0954   HDL 52 11/09/2017  1440   CHOLHDL 2.5 11/08/2018 0954   VLDL 23 10/07/2017 1628   LDLCALC 50 11/08/2018 0954   LABVLDL 17 11/09/2017 1440     Patient has failed these meds in past: none noted Patient is currently controlled on the following medications: atorvastatin 80mg   We discussed:   patients cholesterol well control led.  Educated on importance of statins in keeping LDL low based on patients cardiac history.  Plan  Continue current medications  CAD    Patient has failed these meds in past: none noted Patient is currently controlled on the following medications: ASA 81mg   No chest pain, dyspnea, etc reported.  Plan  Continue current medications  Anxiety    Patient has failed these meds in past: none noted Patient is currently controlled on the following medications: alprazolam 0.5mg  tablets  We discussed:  Taking alprazolam prn for sleep, does not have to use that often Plan  Continue current medications.  Vaccines   Reviewed and discussed patient's vaccination history.    Immunization History  Administered Date(s) Administered   Fluad Quad(high Dose 65+) 11/08/2018   Influenza, High Dose Seasonal PF 01/08/2017   Influenza,inj,Quad PF,6+ Mos 12/28/2012, 01/17/2014   Influenza-Unspecified 01/15/2016, 12/15/2017   PFIZER SARS-COV-2 Vaccination 04/08/2019, 04/28/2019   Pneumococcal Conjugate-13 09/21/2014   Pneumococcal Polysaccharide-23 05/09/2002   Tdap 07/29/2012   Zoster 07/07/2006   Zoster Recombinat (Shingrix) 05/27/2017, 07/30/2017    Plan  Up to date on all vaccinations.   Medication Management   Miscellaneous medications: alprazolam 0.5mg , Nitrostat 0.4 mg SL, Questran 4gm/dose OTC's: Fish oil, preservision AREDS Patient currently uses [facility].  Phone #  [phone] Introduced to [employer] and delivery/med synchronization process, patient will discuss with wife. Patient reports using pill box method to organize medications and promote  adherence. Patient denies missed doses of medication.   Beverly Milch, PharmD Clinical Pharmacist [place] [phone]  I have collaborated with the care management provider regarding care management and care coordination activities outlined in this encounter and have reviewed this encounter including documentation in the note and care plan. I am certifying that I agree with the content of this note and encounter as supervising physician.

## 2019-08-09 ENCOUNTER — Ambulatory Visit: Payer: PPO | Admitting: Pharmacist

## 2019-08-09 ENCOUNTER — Other Ambulatory Visit: Payer: Self-pay

## 2019-08-09 DIAGNOSIS — E785 Hyperlipidemia, unspecified: Secondary | ICD-10-CM

## 2019-08-09 DIAGNOSIS — I5042 Chronic combined systolic (congestive) and diastolic (congestive) heart failure: Secondary | ICD-10-CM

## 2019-08-09 NOTE — Patient Instructions (Addendum)
Thank you for meeting with me today!  I look forward to working with you to help you meet all of your healthcare goals and answer any questions you may have.  Feel free to contact me anytime!   Visit Information  Goals Addressed            This Visit's Progress   . Pharmacy Care Plan:       CARE PLAN ENTRY  Current Barriers:  . Chronic Disease Management support, education, and care coordination needs related to Hyperlipidemia and Heart Failure   Hyperlipidemia . Pharmacist Clinical Goal(s): o Over the next 180 days, patient will work with PharmD and providers to maintain LDL goal < 70 . Current regimen:  o Atorvastatin 80mg  . Interventions: o Comprehensive medication review o Counseled on importance of low LDL with cardiovascular history . Patient self care activities - Over the next 180 days, patient will: o Continue to take medication as prescribed o Contact PharmD or PCP with any medication related health concerns  Heart Failure . Pharmacist Clinical Goal(s) o Over the next 180 days, patient will work with PharmD and providers to optimize medication related to HF. . Current regimen:  o Carvedilol 3.125 mg twice daily o Losartan 25 mg daily . Interventions: o Comprehensive medication review o Continue current therapy . Patient self care activities - Over the next 180 days, patient will: o Weigh themselves as directed by cardiologist o Contact PharmD or PCP you gain more than 3 pounds in one day or 5 pounds in one week.  Initial goal documentation        Mr. Luedeman was given information about Chronic Care Management services today including:  1. CCM service includes personalized support from designated clinical staff supervised by his physician, including individualized plan of care and coordination with other care providers 2. 24/7 contact phone numbers for assistance for urgent and routine care needs. 3. Standard insurance, coinsurance, copays and deductibles  apply for chronic care management only during months in which we provide at least 20 minutes of these services. Most insurances cover these services at 100%, however patients may be responsible for any copay, coinsurance and/or deductible if applicable. This service may help you avoid the need for more expensive face-to-face services. 4. Only one practitioner may furnish and bill the service in a calendar month. 5. The patient may stop CCM services at any time (effective at the end of the month) by phone call to the office staff.  Patient agreed to services and verbal consent obtained.   The patient verbalized understanding of instructions provided today and agreed to receive a mailed copy of patient instruction and/or educational materials. Telephone follow up appointment with pharmacy team member scheduled for: 02/15/2020 @ 8:30 AM  Beverly Milch, PharmD Clinical Pharmacist [facility] [phone]   Heart Failure Eating Plan Heart failure, also called congestive heart failure, occurs when your heart does not pump blood well enough to meet your body's needs for oxygen-rich blood. Heart failure is a long-term (chronic) condition. Living with heart failure can be challenging. However, following your health care provider's instructions about a healthy lifestyle and working with a diet and nutrition specialist (dietitian) to choose the right foods may help to improve your symptoms. What are tips for following this plan? Reading food labels  Check food labels for the amount of sodium per serving. Choose foods that have less than 140 mg (milligrams) of sodium in each serving.  Check food labels for the  number of calories per serving. This is important if you need to limit your daily calorie intake to lose weight.  Check food labels for the serving size. If you eat more than one serving, you will be eating more sodium and calories than what is listed on the label.  Look for  foods that are labeled as "sodium-free," "very low sodium," or "low sodium." ? Foods labeled as "reduced sodium" or "lightly salted" may still have more sodium than what is recommended for you. Cooking  Avoid adding salt when cooking. Ask your health care provider or dietitian before using salt substitutes.  Season food with salt-free seasonings, spices, or herbs. Check the label of seasoning mixes to make sure they do not contain salt.  Cook with heart-healthy oils, such as olive, canola, soybean, or sunflower oil.  Do not fry foods. Cook foods using low-fat methods, such as baking, boiling, grilling, and broiling.  Limit unhealthy fats when cooking by: ? Removing the skin from poultry, such as chicken. ? Removing all visible fats from meats. ? Skimming the fat off from stews, soups, and gravies before serving them. Meal planning   Limit your intake of: ? Processed, canned, or pre-packaged foods. ? Foods that are high in trans fat, such as fried foods. ? Sweets, desserts, sugary drinks, and other foods with added sugar. ? Full-fat dairy products, such as whole milk.  Eat a balanced diet that includes: ? 4-5 servings of fruit each day and 4-5 servings of vegetables each day. At each meal, try to fill half of your plate with fruits and vegetables. ? Up to 6-8 servings of whole grains each day. ? Up to 2 servings of lean meat, poultry, or fish each day. One serving of meat is equal to 3 oz. This is about the same size as a deck of cards. ? 2 servings of low-fat dairy each day. ? Heart-healthy fats. Healthy fats called omega-3 fatty acids are found in foods such as flaxseed and cold-water fish like sardines, salmon, and mackerel.  Aim to eat 25-35 g (grams) of fiber a day. Foods that are high in fiber include apples, broccoli, carrots, beans, peas, and whole grains.  Do not add salt or condiments that contain salt (such as soy sauce) to foods before eating.  When eating at a  restaurant, ask that your food be prepared with less salt or no salt, if possible.  Try to eat 2 or more vegetarian meals each week.  Eat more home-cooked food and eat less restaurant, buffet, and fast food. General information  Do not eat more than 2,300 mg of salt (sodium) a day. The amount of sodium that is recommended for you may be lower, depending on your condition.  Maintain a healthy body weight as directed. Ask your health care provider what a healthy weight is for you. ? Check your weight every day. ? Work with your health care provider and dietitian to make a plan that is right for you to lose weight or maintain your current weight.  Limit how much fluid you drink. Ask your health care provider or dietitian how much fluid you can have each day.  Limit or avoid alcohol as told by your health care provider or dietitian. Recommended foods The items listed may not be a complete list. Talk with your dietitian about what dietary choices are best for you. Fruits All fresh, frozen, and canned fruits. Dried fruits, such as raisins, prunes, and cranberries. Vegetables All fresh vegetables. Vegetables that  are frozen without sauce or added salt. Low-sodium or sodium-free canned vegetables. Grains Bread with less than 80 mg of sodium per slice. Whole-wheat pasta, quinoa, and brown rice. Oats and oatmeal. Barley. Jeff. Grits and cream of wheat. Whole-grain and whole-wheat cold cereal. Meats and other protein foods Lean cuts of meat. Skinless chicken and [place]. Fish with high omega-3 fatty acids, such as salmon, sardines, and other cold-water fishes. Eggs. Dried beans, peas, and edamame. Unsalted nuts and nut butters. Dairy Low-fat or nonfat (skim) milk and dried milk. Rice milk, soy milk, and almond milk. Low-fat or nonfat yogurt. Small amounts of reduced-sodium block cheese. Low-sodium cottage cheese. Fats and oils Olive, canola, soybean, flaxseed, or sunflower oil. Avocado. Sweets  and desserts Apple sauce. Granola bars. Sugar-free pudding and gelatin. Frozen fruit bars. Seasoning and other foods Fresh and dried herbs. Lemon or lime juice. Vinegar. Low-sodium ketchup. Salt-free marinades, salad dressings, sauces, and seasonings. The items listed above may not be a complete list of foods and beverages you can eat. Contact a dietitian for more information. Foods to avoid The items listed may not be a complete list. Talk with your dietitian about what dietary choices are best for you. Fruits Fruits that are dried with sodium-containing preservatives. Vegetables Canned vegetables. Frozen vegetables with sauce or seasonings. Creamed vegetables. [place] fries. Onion rings. Pickled vegetables and sauerkraut. Grains Bread with more than 80 mg of sodium per slice. Hot or cold cereal with more than 140 mg sodium per serving. Salted pretzels and crackers. Pre-packaged breadcrumbs. Bagels, croissants, and biscuits. Meats and other protein foods Ribs and chicken wings. Bacon, ham, pepperoni, bologna, salami, and packaged luncheon meats. Hot dogs, bratwurst, and sausage. Canned meat. Smoked meat and fish. Salted nuts and seeds. Dairy Whole milk, half-and-half, and cream. Buttermilk. Processed cheese, cheese spreads, and cheese curds. Regular cottage cheese. Feta cheese. Shredded cheese. String cheese. Fats and oils Butter, lard, shortening, ghee, and bacon fat. Canned and packaged gravies. Seasoning and other foods Onion salt, garlic salt, table salt, and sea salt. Marinades. Regular salad dressings. Relishes, pickles, and olives. Meat flavorings and tenderizers, and bouillon cubes. Horseradish, ketchup, and mustard. Worcestershire sauce. Teriyaki sauce, soy sauce (including reduced sodium). Hot sauce and Tabasco sauce. Steak sauce, fish sauce, oyster sauce, and cocktail sauce. Taco seasonings. Barbecue sauce. Tartar sauce. The items listed above may not be a complete list of foods and  beverages you should avoid. Contact a dietitian for more information. Summary  A heart failure eating plan includes changes that limit your intake of sodium and unhealthy fat, and it may help you lose weight or maintain a healthy weight. Your health care provider may also recommend limiting how much fluid you drink.  Most people with heart failure should eat no more than 2,300 mg of salt (sodium) a day. The amount of sodium that is recommended for you may be lower, depending on your condition.  Contact your health care provider or dietitian before making any major changes to your diet. This information is not intended to replace advice given to you by your health care provider. Make sure you discuss any questions you have with your health care provider. Document Revised: 04/29/2018 Document Reviewed: 07/18/2016 Elsevier Patient Education  [place].

## 2019-08-16 DIAGNOSIS — Z89511 Acquired absence of right leg below knee: Secondary | ICD-10-CM | POA: Diagnosis not present

## 2019-08-23 ENCOUNTER — Telehealth (HOSPITAL_COMMUNITY): Payer: Self-pay | Admitting: *Deleted

## 2019-09-19 ENCOUNTER — Other Ambulatory Visit: Payer: Self-pay | Admitting: Cardiovascular Disease

## 2019-09-21 ENCOUNTER — Encounter: Payer: Self-pay | Admitting: *Deleted

## 2019-09-22 ENCOUNTER — Other Ambulatory Visit: Payer: PPO

## 2019-09-22 ENCOUNTER — Other Ambulatory Visit: Payer: Self-pay

## 2019-09-22 DIAGNOSIS — Z0000 Encounter for general adult medical examination without abnormal findings: Secondary | ICD-10-CM | POA: Diagnosis not present

## 2019-09-22 DIAGNOSIS — E785 Hyperlipidemia, unspecified: Secondary | ICD-10-CM

## 2019-09-22 LAB — COMPLETE METABOLIC PANEL WITH GFR
AG Ratio: 1.7 (calc) (ref 1.0–2.5)
ALT: 72 U/L — ABNORMAL HIGH (ref 9–46)
AST: 68 U/L — ABNORMAL HIGH (ref 10–35)
Albumin: 3.6 g/dL (ref 3.6–5.1)
Alkaline phosphatase (APISO): 190 U/L — ABNORMAL HIGH (ref 35–144)
BUN: 9 mg/dL (ref 7–25)
CO2: 28 mmol/L (ref 20–32)
Calcium: 8.9 mg/dL (ref 8.6–10.3)
Chloride: 108 mmol/L (ref 98–110)
Creat: 0.81 mg/dL (ref 0.70–1.11)
GFR, Est African American: 93 mL/min/{1.73_m2} (ref >=60)
GFR, Est Non African American: 80 mL/min/{1.73_m2} (ref >=60)
Globulin: 2.1 g/dL (calc) (ref 1.9–3.7)
Glucose, Bld: 85 mg/dL (ref 65–99)
Potassium: 4 mmol/L (ref 3.5–5.3)
Sodium: 140 mmol/L (ref 135–146)
Total Bilirubin: 0.9 mg/dL (ref 0.2–1.2)
Total Protein: 5.7 g/dL — ABNORMAL LOW (ref 6.1–8.1)

## 2019-09-22 LAB — LIPID PANEL
Cholesterol: 114 mg/dL (ref <200)
HDL: 45 mg/dL (ref >=40)
LDL Cholesterol (Calc): 56 mg/dL (calc)
Non-HDL Cholesterol (Calc): 69 mg/dL (calc) (ref <130)
Total CHOL/HDL Ratio: 2.5 (calc) (ref <5.0)
Triglycerides: 52 mg/dL (ref <150)

## 2019-09-22 LAB — CBC WITH DIFFERENTIAL/PLATELET
Absolute Monocytes: 924 cells/uL (ref 200–950)
Basophils Absolute: 42 cells/uL (ref 0–200)
Basophils Relative: 0.6 %
Eosinophils Absolute: 490 cells/uL (ref 15–500)
Eosinophils Relative: 7 %
HCT: 40.6 % (ref 38.5–50.0)
Hemoglobin: 13.5 g/dL (ref 13.2–17.1)
Lymphs Abs: 2023 cells/uL (ref 850–3900)
MCH: 32.7 pg (ref 27.0–33.0)
MCHC: 33.3 g/dL (ref 32.0–36.0)
MCV: 98.3 fL (ref 80.0–100.0)
MPV: 9.9 fL (ref 7.5–12.5)
Monocytes Relative: 13.2 %
Neutro Abs: 3521 cells/uL (ref 1500–7800)
Neutrophils Relative %: 50.3 %
Platelets: 240 10*3/uL (ref 140–400)
RBC: 4.13 10*6/uL — ABNORMAL LOW (ref 4.20–5.80)
RDW: 11.8 % (ref 11.0–15.0)
Total Lymphocyte: 28.9 %
WBC: 7 10*3/uL (ref 3.8–10.8)

## 2019-09-27 ENCOUNTER — Ambulatory Visit: Payer: PPO | Admitting: Physician Assistant

## 2019-09-27 ENCOUNTER — Other Ambulatory Visit: Payer: Self-pay

## 2019-09-27 ENCOUNTER — Encounter: Payer: Self-pay | Admitting: Physician Assistant

## 2019-09-27 DIAGNOSIS — D1800 Hemangioma unspecified site: Secondary | ICD-10-CM | POA: Diagnosis not present

## 2019-09-27 DIAGNOSIS — L570 Actinic keratosis: Secondary | ICD-10-CM | POA: Diagnosis not present

## 2019-09-27 DIAGNOSIS — Z85828 Personal history of other malignant neoplasm of skin: Secondary | ICD-10-CM

## 2019-09-27 DIAGNOSIS — D229 Melanocytic nevi, unspecified: Secondary | ICD-10-CM | POA: Diagnosis not present

## 2019-09-27 DIAGNOSIS — Z1283 Encounter for screening for malignant neoplasm of skin: Secondary | ICD-10-CM

## 2019-09-27 DIAGNOSIS — L820 Inflamed seborrheic keratosis: Secondary | ICD-10-CM | POA: Diagnosis not present

## 2019-09-27 DIAGNOSIS — L821 Other seborrheic keratosis: Secondary | ICD-10-CM

## 2019-09-27 DIAGNOSIS — L578 Other skin changes due to chronic exposure to nonionizing radiation: Secondary | ICD-10-CM

## 2019-09-27 DIAGNOSIS — L814 Other melanin hyperpigmentation: Secondary | ICD-10-CM | POA: Diagnosis not present

## 2019-09-27 DIAGNOSIS — D485 Neoplasm of uncertain behavior of skin: Secondary | ICD-10-CM | POA: Diagnosis not present

## 2019-09-27 NOTE — Progress Notes (Addendum)
Follow-Up Visit   Subjective  Eugene [name] is a 86 y.o. male who presents for the following: Skin Problem (Spot on left ear, left chest and back.  Present for a few months.  No new meds, exposure.  Spot on back is rough and irritating. ).   The following portions of the chart were reviewed this encounter and updated as appropriate: Tobacco  Allergies  Meds  Problems  Med Hx  Surg Hx  Fam Hx      Objective  Well appearing patient in no apparent distress; mood and affect are within normal limits.  All skin waist up examined.  Objective  Left Breast: Thick crust  Objective  Left Lower Back: Thick crust  Objective  Left Antihelix (4), Left Breast, Left Scaphoid Fossa: Erythematous patches with gritty scale.  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  Neoplasm of uncertain behavior of skin (2) Left Breast  Epidermal / dermal shaving  Lesion diameter (cm):  1.3 Informed consent: discussed and consent obtained   Timeout: patient name, date of birth, surgical site, and procedure verified   Procedure prep:  Patient was prepped and draped in usual sterile fashion Prep type:  Chlorhexidine Anesthesia: the lesion was anesthetized in a standard fashion   Anesthetic:  1% lidocaine w/ epinephrine 1-100,000 local infiltration Instrument used: DermaBlade   Hemostasis achieved with: aluminum chloride   Outcome: patient tolerated procedure well   Post-procedure details: sterile dressing applied and wound care instructions given   Dressing type: petrolatum gauze, petrolatum and bandage    Specimen 1 - Surgical pathology Differential Diagnosis: ISK Check Margins: No  Left Lower Back  Epidermal / dermal shaving  Lesion diameter (cm):  1.5 Informed consent: discussed and consent obtained   Timeout: patient name, date of birth, surgical site, and procedure verified   Procedure prep:  Patient was prepped and draped in usual  sterile fashion Prep type:  Chlorhexidine Anesthesia: the lesion was anesthetized in a standard fashion   Anesthetic:  1% lidocaine w/ epinephrine 1-100,000 local infiltration Instrument used: DermaBlade   Hemostasis achieved with: aluminum chloride   Outcome: patient tolerated procedure well   Post-procedure details: sterile dressing applied and wound care instructions given   Dressing type: petrolatum gauze, petrolatum and bandage    Specimen 2 - Surgical pathology Differential Diagnosis: ISK Check Margins: No  AK (actinic keratosis) (6) Left Breast; Left Scaphoid Fossa; Left Antihelix (4)  Destruction of lesion - Left Antihelix, Left Breast, Left Scaphoid Fossa  Destruction method: cryotherapy   Informed consent: discussed and consent obtained   Timeout:  patient name, date of birth, surgical site, and procedure verified Lesion destroyed using liquid nitrogen: Yes   Cryotherapy cycles:  1 Outcome: patient tolerated procedure well with no complications   Post-procedure details: wound care instructions given   Lentigines - Scattered tan macules - Discussed due to sun exposure - Benign, observe - Call for any changes  Seborrheic Keratoses - Stuck-on, waxy, tan-brown papules and plaques  - Discussed benign etiology and prognosis. - Observe - Call for any changes  Melanocytic Nevi - Tan-brown and/or pink-flesh-colored symmetric macules and papules - Benign appearing on exam today - Observation - Call clinic for new or changing moles - Recommend daily use of broad spectrum spf 30+ sunscreen to sun-exposed areas.   Hemangiomas - Red papules - Discussed benign nature - Observe - Call for any changes  Actinic Damage - diffuse scaly erythematous macules  with underlying dyspigmentation - Recommend daily broad spectrum sunscreen SPF 30+ to sun-exposed areas, reapply every 2 hours as needed.  - Call for new or changing lesions.  Skin cancer screening performed  today.   I, Anthonie Lotito, PA-C, have reviewed all documentation's for this visit.  The documentation on 10/28/19 for the exam, diagnosis, procedures and orders are all accurate and complete.

## 2019-09-27 NOTE — Patient Instructions (Signed)

## 2019-09-29 ENCOUNTER — Other Ambulatory Visit: Payer: Self-pay

## 2019-09-29 ENCOUNTER — Ambulatory Visit (INDEPENDENT_AMBULATORY_CARE_PROVIDER_SITE_OTHER): Payer: PPO | Admitting: Family Medicine

## 2019-09-29 ENCOUNTER — Encounter: Payer: PPO | Admitting: Family Medicine

## 2019-09-29 VITALS — BP 128/70 | HR 72 | Temp 98.0°F | Ht 70.0 in | Wt 147.0 lb

## 2019-09-29 DIAGNOSIS — I2510 Atherosclerotic heart disease of native coronary artery without angina pectoris: Secondary | ICD-10-CM

## 2019-09-29 DIAGNOSIS — K52839 Microscopic colitis, unspecified: Secondary | ICD-10-CM

## 2019-09-29 DIAGNOSIS — Z0001 Encounter for general adult medical examination with abnormal findings: Secondary | ICD-10-CM

## 2019-09-29 DIAGNOSIS — Z0000 Encounter for general adult medical examination without abnormal findings: Secondary | ICD-10-CM

## 2019-09-29 DIAGNOSIS — I5042 Chronic combined systolic (congestive) and diastolic (congestive) heart failure: Secondary | ICD-10-CM

## 2019-09-29 DIAGNOSIS — R7989 Other specified abnormal findings of blood chemistry: Secondary | ICD-10-CM

## 2019-09-29 NOTE — Progress Notes (Signed)
Subjective:    Patient ID: Eugene [name], male    DOB: 1932/01/06, 86 y.o.   MRN: [id number]  Medication Refill    Patient is scheduled today for complete physical exam.  Patient is a very pleasant 86 year old Caucasian male.  He denies any specific concerns.  His most recent lab work is listed below and is significant for mild elevations in his liver function test.  Because of that I asked him to temporarily hold his Lipitor so that we could recheck his levels in a week or so to see if they were trending down. Lab on 09/22/2019  Component Date Value Ref Range Status  . WBC 09/22/2019 7.0  3.8 - 10.8 Thousand/uL Final  . RBC 09/22/2019 4.13* 4.20 - 5.80 Million/uL Final  . Hemoglobin 09/22/2019 13.5  13.2 - 17.1 g/dL Final  . HCT 09/22/2019 40.6  38 - 50 % Final  . MCV 09/22/2019 98.3  80.0 - 100.0 fL Final  . MCH 09/22/2019 32.7  27.0 - 33.0 pg Final  . MCHC 09/22/2019 33.3  32.0 - 36.0 g/dL Final  . RDW 09/22/2019 11.8  11.0 - 15.0 % Final  . Platelets 09/22/2019 240  140 - 400 Thousand/uL Final  . MPV 09/22/2019 9.9  7.5 - 12.5 fL Final  . Neutro Abs 09/22/2019 3,521  1,500 - 7,800 cells/uL Final  . Lymphs Abs 09/22/2019 2,023  850 - 3,900 cells/uL Final  . Absolute Monocytes 09/22/2019 924  200 - 950 cells/uL Final  . Eosinophils Absolute 09/22/2019 490  15 - 500 cells/uL Final  . Basophils Absolute 09/22/2019 42  0 - 200 cells/uL Final  . Neutrophils Relative % 09/22/2019 50.3  % Final  . Total Lymphocyte 09/22/2019 28.9  % Final  . Monocytes Relative 09/22/2019 13.2  % Final  . Eosinophils Relative 09/22/2019 7.0  % Final  . Basophils Relative 09/22/2019 0.6  % Final  . Glucose, Bld 09/22/2019 85  65 - 99 mg/dL Final   Comment: .            Fasting reference interval .   . BUN 09/22/2019 9  7 - 25 mg/dL Final  . Creat 09/22/2019 0.81  0.70 - 1.11 mg/dL Final   Comment: For patients >38 years of age, the reference limit for Creatinine is approximately 13% higher for  people identified as African-American. .   . GFR, Est Non African American 09/22/2019 80  > OR = 60 mL/min/1.77m2 Final  . GFR, Est African American 09/22/2019 93  > OR = 60 mL/min/1.68m2 Final  . BUN/Creatinine Ratio 02/77/4128 NOT APPLICABLE  6 - 22 (calc) Final  . Sodium 09/22/2019 140  135 - 146 mmol/L Final  . Potassium 09/22/2019 4.0  3.5 - 5.3 mmol/L Final  . Chloride 09/22/2019 108  98 - 110 mmol/L Final  . CO2 09/22/2019 28  20 - 32 mmol/L Final  . Calcium 09/22/2019 8.9  8.6 - 10.3 mg/dL Final  . Total Protein 09/22/2019 5.7* 6.1 - 8.1 g/dL Final  . Albumin 09/22/2019 3.6  3.6 - 5.1 g/dL Final  . Globulin 09/22/2019 2.1  1.9 - 3.7 g/dL (calc) Final  . AG Ratio 09/22/2019 1.7  1.0 - 2.5 (calc) Final  . Total Bilirubin 09/22/2019 0.9  0.2 - 1.2 mg/dL Final  . Alkaline phosphatase (APISO) 09/22/2019 190* 35 - 144 U/L Final  . AST 09/22/2019 68* 10 - 35 U/L Final  . ALT 09/22/2019 72* 9 - 46 U/L Final  . Cholesterol 09/22/2019  114  <200 mg/dL Final  . HDL 09/22/2019 45  > OR = 40 mg/dL Final  . Triglycerides 09/22/2019 52  <150 mg/dL Final  . LDL Cholesterol (Calc) 09/22/2019 56  mg/dL (calc) Final   Comment: Reference range: <100 . Desirable range <100 mg/dL for primary prevention;   <70 mg/dL for patients with CHD or diabetic patients  with > or = 2 CHD risk factors. Marland Kitchen LDL-C is now calculated using the Martin-Hopkins  calculation, which is a validated novel method providing  better accuracy than the Friedewald equation in the  estimation of LDL-C.  Cresenciano Genre et al. Annamaria Helling. 3267;124(58): 2061-2068  (http://education.[facility].com/faq/FAQ164)   . Total CHOL/HDL Ratio 09/22/2019 2.5  <5.0 (calc) Final  . Non-HDL Cholesterol (Calc) 09/22/2019 69  <130 mg/dL (calc) Final   Comment: For patients with diabetes plus 1 major ASCVD risk  factor, treating to a non-HDL-C goal of <100 mg/dL  (LDL-C of <70 mg/dL) is considered a therapeutic  option.    Patient denies any  memory loss.  He is still driving.  He denies any falls.  He denies any depression.  He states that he does not walk as well as he once did.  The patient has a history of a leg amputation and walks with a prosthetic device.  He is still able to stand up quickly from a seated position without holding onto the counter.  He is able to climb onto the exam table without difficulty.  Therefore given his age and medical comorbidities I think he is doing extremely well.  Immunization History  Administered Date(s) Administered  . Fluad Quad(high Dose 65+) 11/08/2018  . Influenza, High Dose Seasonal PF 01/08/2017, 12/15/2017  . Influenza,inj,Quad PF,6+ Mos 12/28/2012, 01/17/2014  . Influenza-Unspecified 01/15/2016, 12/15/2017  . PFIZER SARS-COV-2 Vaccination 04/08/2019, 04/28/2019  . Pneumococcal Conjugate-13 09/21/2014  . Pneumococcal Polysaccharide-23 05/09/2002  . Tdap 07/29/2012  . Zoster 07/07/2006  . Zoster Recombinat (Shingrix) 05/27/2017, 07/30/2017   Past Medical History:  Diagnosis Date  . Amputated right leg ([place])   . CAD (coronary artery disease)    a. 09/2017 - acute MI -  emergent cath showing 99% stenosis in midLAD with thrombotic stenosis at the bifurcation of the second diagonal, which was relatively small in diameter. He underwent placement of DES to midLAD which jailed the second diagonal.   . Chronic combined systolic and diastolic heart failure ([place])   . Collagenous colitis    Dr. Festus Holts  . History of ST elevation myocardial infarction (STEMI)    09/2017: LAD  . History of stomach ulcers   . Hyperlipidemia LDL goal <70   . Ischemic cardiomyopathy   . Squamous cell carcinoma of skin 03/27/2014   in situ-left temple (txpbx)  . Squamous cell carcinoma of skin 02/24/2018   in situ-left cheek ([id number])   Past Surgical History:  Procedure Laterality Date  . BACK SURGERY    . CARDIAC CATHETERIZATION    . CORONARY STENT INTERVENTION N/A 10/07/2017   Procedure: CORONARY STENT  INTERVENTION;  Surgeon: Wellington Hampshire, MD;  Location: [facility];  Service: Cardiovascular;  Laterality: N/A;  . CORONARY/GRAFT ACUTE MI REVASCULARIZATION N/A 10/07/2017   Procedure: Coronary/Graft Acute MI Revascularization;  Surgeon: Wellington Hampshire, MD;  Location: [facility];  Service: Cardiovascular;  Laterality: N/A;  . LEFT HEART CATH AND CORONARY ANGIOGRAPHY N/A 10/07/2017   Procedure: LEFT HEART CATH AND CORONARY ANGIOGRAPHY;  Surgeon: Wellington Hampshire, MD;  Location: [facility];  Service: Cardiovascular;  Laterality: N/A;  . LEG AMPUTATION     Right Below knee  . STOMACH SURGERY     Billroth II gastrojejunostomy  . TOTAL KNEE ARTHROPLASTY     Current Outpatient Medications on File Prior to Visit  Medication Sig Dispense Refill  . ALPRAZolam (XANAX) 0.5 MG tablet TAKE 1 TABLET BY MOUTH EVERY DAY AT BEDTIME AS NEEDED FOR SLEEP 30 tablet 0  . aspirin 81 MG tablet Take 81 mg by mouth at bedtime.    Marland Kitchen atorvastatin (LIPITOR) 80 MG tablet TAKE 1 TABLET (80 MG TOTAL) BY MOUTH DAILY AT 6 PM. 90 tablet 3  . carvedilol (COREG) 3.125 MG tablet TAKE 1 TABLET BY MOUTH TWICE A DAY WITH MEALS 180 tablet 1  . cholestyramine (QUESTRAN) 4 GM/DOSE powder USE 1 SCOOP 3 TIMES A DAY WITH A MEAL 756 g 6  . losartan (COZAAR) 25 MG tablet TAKE 1/2 TABLET BY MOUTH EVERY DAY 45 tablet 2  . Multiple Vitamins-Minerals (PRESERVISION AREDS PO) Take 1 tablet by mouth 2 (two) times daily.    . nitroGLYCERIN (NITROSTAT) 0.4 MG SL tablet Place 1 tablet (0.4 mg total) under the tongue every 5 (five) minutes x 3 doses as needed for chest pain. 25 tablet 3  . Omega-3 Fatty Acids (FISH OIL PO) Take 1 capsule by mouth at bedtime.     No current facility-administered medications on file prior to visit.   Allergies  Allergen Reactions  . Aleve [Naproxen Sodium] Nausea And Vomiting    No problem with other NSAIDs  . Amoxicillin Other (See Comments)    Did it involve swelling of the  face/tongue/throat, SOB, or low BP? No Did it involve sudden or severe rash/hives, skin peeling, or any reaction on the inside of your mouth or nose? No Did you need to seek medical attention at a hospital or doctor's office? unknown When did it last happen?yrs ago dizziness If all above answers are "NO", may proceed with cephalosporin use.   Marland Kitchen Morphine And Related     dizzy  . Penicillins     Diarrhea Did it involve swelling of the face/tongue/throat, SOB, or low BP? No Did it involve sudden or severe rash/hives, skin peeling, or any reaction on the inside of your mouth or nose? No Did you need to seek medical attention at a hospital or doctor's office? Unknown When did it last happen?years ago If all above answers are "NO", may proceed with cephalosporin use.    Social History   Socioeconomic History  . Marital status: Married    Spouse name: Financial risk analyst  . Number of children: Not on file  . Years of education: 65  . Highest education level: High school graduate  Occupational History  . Occupation: Retired  Tobacco Use  . Smoking status: Former Smoker    Packs/day: 1.00    Years: 8.00    Pack years: 8.00    Types: Cigarettes  . Smokeless tobacco: Never Used  Vaping Use  . Vaping Use: Never used  Substance and Sexual Activity  . Alcohol use: Yes    Comment: occasional  . Drug use: No  . Sexual activity: Yes    Comment: married  Other Topics Concern  . Not on file  Social History Narrative  . Not on file   Social Determinants of Health   Financial Resource Strain: Low Risk   . Difficulty of Paying Living Expenses: Not hard at all  Food Insecurity:   . Worried About Running  Out of Food in the Last Year:   . [place] in the Last Year:   Transportation Needs:   . Lack of Transportation (Medical):   Marland Kitchen Lack of Transportation (Non-Medical):   Physical Activity:   . Days of Exercise per Week:   . Minutes of Exercise per Session:   Stress:   .  Feeling of Stress :   Social Connections:   . Frequency of Communication with Friends and Family:   . Frequency of Social Gatherings with Friends and Family:   . Attends Religious Services:   . Active Member of Clubs or Organizations:   . Attends Archivist Meetings:   Marland Kitchen Marital Status:   Intimate Partner Violence:   . Fear of Current or Ex-Partner:   . Emotionally Abused:   Marland Kitchen Physically Abused:   . Sexually Abused:    Family History  Problem Relation Age of Onset  . Diabetes Sister   . Diabetes Brother   . Diabetes Sister   . Cancer Sister        breast      Review of Systems  All other systems reviewed and are negative.      Objective:   Physical Exam Vitals reviewed.  Constitutional:      General: He is not in acute distress.    Appearance: He is well-developed. He is not diaphoretic.  HENT:     Head: Normocephalic and atraumatic.     Right Ear: External ear normal.     Left Ear: External ear normal.     Nose: Nose normal.     Mouth/Throat:     Pharynx: No oropharyngeal exudate.  Eyes:     General: No scleral icterus.       Right eye: No discharge.        Left eye: No discharge.     Conjunctiva/sclera: Conjunctivae normal.     Pupils: Pupils are equal, round, and reactive to light.  Neck:     Thyroid: No thyromegaly.     Vascular: No JVD.     Trachea: No tracheal deviation.  Cardiovascular:     Rate and Rhythm: Normal rate and regular rhythm.     Heart sounds: Normal heart sounds. No murmur heard.  No friction rub. No gallop.   Pulmonary:     Effort: Pulmonary effort is normal. No respiratory distress.     Breath sounds: Normal breath sounds. No stridor. No wheezing or rales.  Chest:     Chest wall: No tenderness.  Abdominal:     General: Bowel sounds are normal. There is no distension.     Palpations: Abdomen is soft. There is no mass.     Tenderness: There is no abdominal tenderness. There is no guarding or rebound.  Musculoskeletal:         General: No tenderness. Normal range of motion.     Cervical back: Normal range of motion and neck supple.  Lymphadenopathy:     Cervical: No cervical adenopathy.  Skin:    General: Skin is warm.     Coloration: Skin is not pale.     Findings: No erythema or rash.  Neurological:     Mental Status: He is alert and oriented to person, place, and time.     Cranial Nerves: No cranial nerve deficit.     Motor: No abnormal muscle tone.     Coordination: Coordination normal.     Deep Tendon Reflexes: Reflexes are normal and symmetric.  Psychiatric:        Behavior: Behavior normal.        Thought Content: Thought content normal.        Judgment: Judgment normal.   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 Examination of his left foot shows normal dorsalis pedis pulses, normal posterior tibialis pulses, normal sensation to 10 g monofilament and no evidence of ulcers or sores. He does have a dystrophic toenail on his left great toe        Assessment & Plan:  Elevated LFTs - Plan: COMPLETE METABOLIC PANEL WITH GFR  Coronary artery disease involving native coronary artery of native heart without angina pectoris  ASCVD (arteriosclerotic cardiovascular disease)  Routine general medical examination at a health care facility  Microscopic colitis, unspecified microscopic colitis type  Chronic combined systolic and diastolic heart failure ([place])  Given his heart history, I want to resume the statin if at all possible and is soon as possible.  Therefore I will recheck a CMP today.  If his liver tests are improving, this would explain that the elevation in his liver function test were due to the statin.  Therefore we may need to start a lower dose and monitor this more closely.  However if the liver function test are increasing, I would recommend a right upper quadrant ultrasound to evaluate for any other potential causes for his liver  inflammation.  Regarding his coronary artery disease, this is managed by his cardiologist.  However he is on the appropriate medication including aspirin, losartan, and carvedilol.  I would like to have him resume statin as soon as possible.  Regarding his microscopic colitis, he is unable to afford budesonide.  He is managing this symptomatically with cholestyramine and occasionally Imodium.  Regarding heart failure, he is on a combination of carvedilol and losartan.  He is completely asymptomatic.  Due to his age he does not require prostate cancer colon cancer screening.  Immunizations are up-to-date.  He denies any falls, depression, or memory loss.  Await the results of his liver function test.

## 2019-09-30 ENCOUNTER — Other Ambulatory Visit: Payer: Self-pay

## 2019-09-30 ENCOUNTER — Encounter: Payer: Self-pay | Admitting: Physician Assistant

## 2019-09-30 DIAGNOSIS — R7989 Other specified abnormal findings of blood chemistry: Secondary | ICD-10-CM

## 2019-09-30 LAB — COMPLETE METABOLIC PANEL WITHOUT GFR
AG Ratio: 1.6 (calc) (ref 1.0–2.5)
ALT: 44 U/L (ref 9–46)
AST: 38 U/L — ABNORMAL HIGH (ref 10–35)
Albumin: 3.7 g/dL (ref 3.6–5.1)
Alkaline phosphatase (APISO): 172 U/L — ABNORMAL HIGH (ref 35–144)
BUN: 10 mg/dL (ref 7–25)
CO2: 24 mmol/L (ref 20–32)
Calcium: 8.8 mg/dL (ref 8.6–10.3)
Chloride: 107 mmol/L (ref 98–110)
Creat: 0.78 mg/dL (ref 0.70–1.11)
GFR, Est African American: 95 mL/min/{1.73_m2}
GFR, Est Non African American: 82 mL/min/{1.73_m2}
Globulin: 2.3 g/dL (ref 1.9–3.7)
Glucose, Bld: 150 mg/dL — ABNORMAL HIGH (ref 65–99)
Potassium: 4 mmol/L (ref 3.5–5.3)
Sodium: 138 mmol/L (ref 135–146)
Total Bilirubin: 1 mg/dL (ref 0.2–1.2)
Total Protein: 6 g/dL — ABNORMAL LOW (ref 6.1–8.1)

## 2019-10-06 ENCOUNTER — Other Ambulatory Visit: Payer: PPO

## 2019-10-06 ENCOUNTER — Other Ambulatory Visit: Payer: Self-pay

## 2019-10-06 DIAGNOSIS — R7989 Other specified abnormal findings of blood chemistry: Secondary | ICD-10-CM

## 2019-10-06 LAB — COMPLETE METABOLIC PANEL WITH GFR
AG Ratio: 1.7 (calc) (ref 1.0–2.5)
ALT: 25 U/L (ref 9–46)
AST: 22 U/L (ref 10–35)
Albumin: 3.7 g/dL (ref 3.6–5.1)
Alkaline phosphatase (APISO): 131 U/L (ref 35–144)
BUN: 11 mg/dL (ref 7–25)
CO2: 28 mmol/L (ref 20–32)
Calcium: 9.1 mg/dL (ref 8.6–10.3)
Chloride: 104 mmol/L (ref 98–110)
Creat: 0.83 mg/dL (ref 0.70–1.11)
GFR, Est African American: 92 mL/min/{1.73_m2} (ref >=60)
GFR, Est Non African American: 80 mL/min/{1.73_m2} (ref >=60)
Globulin: 2.2 g/dL (calc) (ref 1.9–3.7)
Glucose, Bld: 72 mg/dL (ref 65–99)
Potassium: 4.3 mmol/L (ref 3.5–5.3)
Sodium: 140 mmol/L (ref 135–146)
Total Bilirubin: 0.9 mg/dL (ref 0.2–1.2)
Total Protein: 5.9 g/dL — ABNORMAL LOW (ref 6.1–8.1)

## 2019-10-07 ENCOUNTER — Other Ambulatory Visit: Payer: Self-pay

## 2019-10-07 ENCOUNTER — Other Ambulatory Visit: Payer: Self-pay | Admitting: Family Medicine

## 2019-10-07 MED ORDER — ROSUVASTATIN CALCIUM 20 MG PO TABS: 20.0000 mg | ORAL_TABLET | Freq: Every day | ORAL | 5 refills | Status: DC

## 2019-10-10 DIAGNOSIS — H35412 Lattice degeneration of retina, left eye: Secondary | ICD-10-CM | POA: Diagnosis not present

## 2019-10-10 DIAGNOSIS — H353132 Nonexudative age-related macular degeneration, bilateral, intermediate dry stage: Secondary | ICD-10-CM | POA: Diagnosis not present

## 2019-10-10 DIAGNOSIS — H43813 Vitreous degeneration, bilateral: Secondary | ICD-10-CM | POA: Diagnosis not present

## 2019-10-10 DIAGNOSIS — H35371 Puckering of macula, right eye: Secondary | ICD-10-CM | POA: Diagnosis not present

## 2019-10-11 ENCOUNTER — Other Ambulatory Visit: Payer: Self-pay

## 2019-10-28 NOTE — Addendum Note (Signed)
Addended by: Robyne Askew R on: 10/28/2019 08:10 AM   Modules accepted: Level of Service

## 2019-11-03 ENCOUNTER — Telehealth: Payer: Self-pay | Admitting: Pharmacist

## 2019-11-03 NOTE — Progress Notes (Addendum)
Chronic Care Management Pharmacy Assistant   Name: Eugene [name]  MRN: [id number] DOB: 01-12-1932  Reason for Encounter: General Follow Up  Patient Questions: Have you seen any other providers since your last visit? Yes, 07/15/21[name], MD (PCP) Any changes in your medicines or health? Yes, Patient starting back up on Statin  [name], MD) Any medication side effects or issues? No medication side effects. Does patient have any requests/questions for Leata Mouse, CPP? Patient and wife inquired about the 3rd COVID booster shot due to patient's history of a myocardial infarction and his wife being his caregiver. Patient also asking which insurance is best to try if he is not pleased with [employer]. Notified Leata Mouse, CPP. He will follow-up with patient. Patient also asked me to call his pharmacy to get his Budesonide prescription ready. Notified pt it could take a few hrs for prescription to be ready for pick-up. Patient had good understanding. Called pharmacy and spoke with pharmacist. Medication will be ready in a few hours.  PCP : Susy Frizzle, MD  Allergies:   Allergies  Allergen Reactions   Aleve [Naproxen Sodium] Nausea And Vomiting    No problem with other NSAIDs   Amoxicillin Other (See Comments)    Did it involve swelling of the face/tongue/throat, SOB, or low BP? No Did it involve sudden or severe rash/hives, skin peeling, or any reaction on the inside of your mouth or nose? No Did you need to seek medical attention at a hospital or doctor's office? unknown When did it last happen?   yrs ago    dizziness If all above answers are "NO", may proceed with cephalosporin use.    Morphine And Related     dizzy   Penicillins     Diarrhea Did it involve swelling of the face/tongue/throat, SOB, or low BP? No Did it involve sudden or severe rash/hives, skin peeling, or any reaction on the inside of your mouth or nose? No Did you need to seek  medical attention at a hospital or doctor's office? Unknown When did it last happen?    years ago   If all above answers are "NO", may proceed with cephalosporin use.     Medications: Outpatient Encounter Medications as of 11/03/2019  Medication Sig   ALPRAZolam (XANAX) 0.5 MG tablet TAKE 1 TABLET BY MOUTH EVERY DAY AT BEDTIME AS NEEDED FOR SLEEP   aspirin 81 MG tablet Take 81 mg by mouth at bedtime.   carvedilol (COREG) 3.125 MG tablet TAKE 1 TABLET BY MOUTH TWICE A DAY WITH MEALS   cholestyramine (QUESTRAN) 4 GM/DOSE powder USE 1 SCOOP 3 TIMES A DAY WITH A MEAL   losartan (COZAAR) 25 MG tablet TAKE 1/2 TABLET BY MOUTH EVERY DAY   Multiple Vitamins-Minerals (PRESERVISION AREDS PO) Take 1 tablet by mouth 2 (two) times daily.   nitroGLYCERIN (NITROSTAT) 0.4 MG SL tablet Place 1 tablet (0.4 mg total) under the tongue every 5 (five) minutes x 3 doses as needed for chest pain.   Omega-3 Fatty Acids (FISH OIL PO) Take 1 capsule by mouth at bedtime.   rosuvastatin (CRESTOR) 20 MG tablet Take 1 tablet (20 mg total) by mouth daily.   No facility-administered encounter medications on file as of 11/03/2019.    Current Diagnosis: Patient Active Problem List   Diagnosis Date Noted   CAD (coronary artery disease) 10/10/2017   Ischemic cardiomyopathy    Chronic combined systolic and diastolic heart failure ([place])  Hyperlipidemia LDL goal <70    Acute ST elevation myocardial infarction (STEMI) involving left anterior descending (LAD) coronary artery ([place]) 10/07/2017   Collagenous colitis    History of right below knee amputation ([place]) 10/13/2013   Sinus tachycardia 06/04/2012   Anxiety state, unspecified 06/04/2012   Pharmacy: Patient satisfied with [facility]. Patient getting medication refills on time and satisified with medication pricing.  Diet/Exercise: Patient also eats vegetables daily from home garden. Patient eats fruits daily including a banana for breakfast every morning. Patient  eats meat (pork, hamburger, chicken, etc.) and potatoes but rarely eats fried foods. Patient gets exercise regularly through gardening, lawn care and walking. Patient used to go to the [facility] to exercise (treadmill, elliptical, bicycles, etc.), but no longer can due to the current Pandemic.   Follow-Up:  Pharmacist Review

## 2019-11-30 DIAGNOSIS — K52831 Collagenous colitis: Secondary | ICD-10-CM | POA: Diagnosis not present

## 2019-11-30 DIAGNOSIS — R197 Diarrhea, unspecified: Secondary | ICD-10-CM | POA: Diagnosis not present

## 2019-11-30 DIAGNOSIS — R7401 Elevation of levels of liver transaminase levels: Secondary | ICD-10-CM | POA: Diagnosis not present

## 2019-12-20 ENCOUNTER — Other Ambulatory Visit: Payer: Self-pay | Admitting: Cardiovascular Disease

## 2020-01-03 ENCOUNTER — Other Ambulatory Visit: Payer: PPO

## 2020-01-03 ENCOUNTER — Other Ambulatory Visit: Payer: Self-pay

## 2020-01-03 DIAGNOSIS — R7989 Other specified abnormal findings of blood chemistry: Secondary | ICD-10-CM | POA: Diagnosis not present

## 2020-01-03 DIAGNOSIS — E785 Hyperlipidemia, unspecified: Secondary | ICD-10-CM

## 2020-01-04 ENCOUNTER — Telehealth: Payer: Self-pay | Admitting: Family Medicine

## 2020-01-04 LAB — COMPLETE METABOLIC PANEL WITH GFR
AG Ratio: 1.8 (calc) (ref 1.0–2.5)
ALT: 30 U/L (ref 9–46)
AST: 25 U/L (ref 10–35)
Albumin: 3.5 g/dL — ABNORMAL LOW (ref 3.6–5.1)
Alkaline phosphatase (APISO): 73 U/L (ref 35–144)
BUN: 11 mg/dL (ref 7–25)
CO2: 28 mmol/L (ref 20–32)
Calcium: 8.5 mg/dL — ABNORMAL LOW (ref 8.6–10.3)
Chloride: 105 mmol/L (ref 98–110)
Creat: 0.84 mg/dL (ref 0.70–1.11)
GFR, Est African American: 92 mL/min/{1.73_m2} (ref >=60)
GFR, Est Non African American: 79 mL/min/{1.73_m2} (ref >=60)
Globulin: 1.9 g/dL (calc) (ref 1.9–3.7)
Glucose, Bld: 83 mg/dL (ref 65–99)
Potassium: 3.9 mmol/L (ref 3.5–5.3)
Sodium: 139 mmol/L (ref 135–146)
Total Bilirubin: 0.8 mg/dL (ref 0.2–1.2)
Total Protein: 5.4 g/dL — ABNORMAL LOW (ref 6.1–8.1)

## 2020-01-04 LAB — LIPID PANEL
Cholesterol: 130 mg/dL (ref <200)
HDL: 63 mg/dL (ref >=40)
LDL Cholesterol (Calc): 52 mg/dL (calc)
Non-HDL Cholesterol (Calc): 67 mg/dL (calc) (ref <130)
Total CHOL/HDL Ratio: 2.1 (calc) (ref <5.0)
Triglycerides: 69 mg/dL (ref <150)

## 2020-01-04 NOTE — Telephone Encounter (Signed)
CB#[id number] LAB RESULTS

## 2020-01-05 ENCOUNTER — Other Ambulatory Visit: Payer: Self-pay | Admitting: Cardiovascular Disease

## 2020-01-05 NOTE — Telephone Encounter (Signed)
Lab results was given to his Spouse Mrs. Wilborn.

## 2020-01-07 ENCOUNTER — Encounter: Payer: Self-pay | Admitting: Family Medicine

## 2020-01-11 ENCOUNTER — Telehealth: Payer: Self-pay | Admitting: Pharmacist

## 2020-01-11 NOTE — Progress Notes (Signed)
Chronic Care Management Pharmacy Assistant   Name: Eugene [name]  MRN: [id number] DOB: 05/20/1931  Reason for Encounter: Disease State  Patient Questions:  1.  Have you seen any other providers since your last visit? No  2.  Any changes in your medicines or health? No    PCP : Susy Frizzle, MD  Their chronic conditions include: CAD, Systolic/diastolic HF, Hyperlipidemia, anxiety  Office Visits: None since their last ccm phone call with the pharmacist assistant on 11-03-19.  Consults: None since their last ccm phone call with the pharmacist assistant on 11-03-19.  Allergies:   Allergies  Allergen Reactions  . Aleve [Naproxen Sodium] Nausea And Vomiting    No problem with other NSAIDs  . Amoxicillin Other (See Comments)    Did it involve swelling of the face/tongue/throat, SOB, or low BP? No Did it involve sudden or severe rash/hives, skin peeling, or any reaction on the inside of your mouth or nose? No Did you need to seek medical attention at a hospital or doctor's office? unknown When did it last happen?yrs ago dizziness If all above answers are "NO", may proceed with cephalosporin use.   Marland Kitchen Morphine And Related     dizzy  . Penicillins     Diarrhea Did it involve swelling of the face/tongue/throat, SOB, or low BP? No Did it involve sudden or severe rash/hives, skin peeling, or any reaction on the inside of your mouth or nose? No Did you need to seek medical attention at a hospital or doctor's office? Unknown When did it last happen?years ago If all above answers are "NO", may proceed with cephalosporin use.     Medications: Outpatient Encounter Medications as of 01/11/2020  Medication Sig  . ALPRAZolam (XANAX) 0.5 MG tablet TAKE 1 TABLET BY MOUTH EVERY DAY AT BEDTIME AS NEEDED FOR SLEEP  . aspirin 81 MG tablet Take 81 mg by mouth at bedtime.  . carvedilol (COREG) 3.125 MG tablet TAKE 1 TABLET BY MOUTH TWICE A DAY WITH MEALS  . cholestyramine  (QUESTRAN) 4 GM/DOSE powder USE 1 SCOOP 3 TIMES A DAY WITH A MEAL  . losartan (COZAAR) 25 MG tablet TAKE 1/2 TABLET BY MOUTH EVERY DAY  . Multiple Vitamins-Minerals (PRESERVISION AREDS PO) Take 1 tablet by mouth 2 (two) times daily.  . nitroGLYCERIN (NITROSTAT) 0.4 MG SL tablet Place 1 tablet (0.4 mg total) under the tongue every 5 (five) minutes x 3 doses as needed for chest pain.  . Omega-3 Fatty Acids (FISH OIL PO) Take 1 capsule by mouth at bedtime.  . rosuvastatin (CRESTOR) 20 MG tablet Take 1 tablet (20 mg total) by mouth daily.   No facility-administered encounter medications on file as of 01/11/2020.    Current Diagnosis: Patient Active Problem List   Diagnosis Date Noted  . CAD (coronary artery disease) 10/10/2017  . Ischemic cardiomyopathy   . Chronic combined systolic and diastolic heart failure ([place])   . Hyperlipidemia LDL goal <70   . Acute ST elevation myocardial infarction (STEMI) involving left anterior descending (LAD) coronary artery ([place]) 10/07/2017  . Collagenous colitis   . History of right below knee amputation ([place]) 10/13/2013  . Sinus tachycardia 06/04/2012  . Anxiety state, unspecified 06/04/2012    Goals Addressed   None    01/11/2020 Name: Eugene [name] MRN: [id number] DOB: 1931-12-02 Eugene [name] is a 86 y.o. year old male who is a primary care patient of Pickard, Cammie Mcgee, MD.  Comprehensive medication review performed; Spoke  to patient regarding cholesterol  Lipid Panel    Component Value Date/Time   CHOL 130 01/03/2020 1154   CHOL 112 11/09/2017 1440   TRIG 69 01/03/2020 1154   HDL 63 01/03/2020 1154   HDL 52 11/09/2017 1440   LDLCALC 52 01/03/2020 1154    10-year ASCVD risk score: The ASCVD Risk score Mikey Bussing DC Jr., et al., 2013) failed to calculate for the following reasons:   The 2013 ASCVD risk score is only valid for ages 6 to 109   The patient has a prior MI or stroke diagnosis  . Current antihyperlipidemic regimen:  o rosuvastatin 20  mg (recent change)  . Previous antihyperlipidemic medications tried: none noted  . ASCVD risk enhancing conditions: age >76, DM, HTN, CKD, CHF, current smoker  . What recent interventions/DTPs have been made by any provider to improve Cholesterol control since last CPP Visit: Patient states labs were completed and the results led Dr. Dennard Schaumann to stop his atorvastatin 80 mg on 10-07-19. He is now in rosuvastatin 20 mg.  . Any recent hospitalizations or ED visits since last visit with CPP? No   . What diet changes have been made to improve Cholesterol?  o No changes  . What exercise is being done to improve Cholesterol?  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 No CPP please confirm  Reminded the patient of his appointment with the clinical pharmacist on Wednesday December 1st at 8:30am over the telephone.  Follow-Up:  Pharmacist Review   Fanny Skates, [place] Pharmacist Assistant [phone]

## 2020-01-25 DIAGNOSIS — H353133 Nonexudative age-related macular degeneration, bilateral, advanced atrophic without subfoveal involvement: Secondary | ICD-10-CM | POA: Diagnosis not present

## 2020-01-25 DIAGNOSIS — H52203 Unspecified astigmatism, bilateral: Secondary | ICD-10-CM | POA: Diagnosis not present

## 2020-01-25 DIAGNOSIS — Z961 Presence of intraocular lens: Secondary | ICD-10-CM | POA: Diagnosis not present

## 2020-02-06 DIAGNOSIS — R197 Diarrhea, unspecified: Secondary | ICD-10-CM | POA: Diagnosis not present

## 2020-02-06 DIAGNOSIS — R7401 Elevation of levels of liver transaminase levels: Secondary | ICD-10-CM | POA: Diagnosis not present

## 2020-02-06 DIAGNOSIS — R634 Abnormal weight loss: Secondary | ICD-10-CM | POA: Diagnosis not present

## 2020-02-06 DIAGNOSIS — K52831 Collagenous colitis: Secondary | ICD-10-CM | POA: Diagnosis not present

## 2020-02-15 ENCOUNTER — Other Ambulatory Visit: Payer: Self-pay

## 2020-02-15 ENCOUNTER — Ambulatory Visit: Payer: PPO

## 2020-02-15 DIAGNOSIS — I2510 Atherosclerotic heart disease of native coronary artery without angina pectoris: Secondary | ICD-10-CM

## 2020-02-15 DIAGNOSIS — I5042 Chronic combined systolic (congestive) and diastolic (congestive) heart failure: Secondary | ICD-10-CM

## 2020-02-15 DIAGNOSIS — E785 Hyperlipidemia, unspecified: Secondary | ICD-10-CM

## 2020-02-15 MED ORDER — NITROGLYCERIN 0.4 MG SL SUBL: 0.4000 mg | SUBLINGUAL_TABLET | SUBLINGUAL | 3 refills | Status: DC | PRN

## 2020-02-15 NOTE — Chronic Care Management (AMB) (Addendum)
Chronic Care Management Pharmacy  Name: Eugene [name]  MRN: [id number] DOB: May 26, 1931  Chief Complaint/ HPI  Eugene [name],  86 y.o. , male presents for their Follow-Up CCM visit with the clinical pharmacist In office.  PCP : Eugene Frizzle, MD  Their chronic conditions include: CAD, Systolic/diastolic HF, Hyperlipidemia, anxiety.  Office Visits: 01/05/2020 ([facility]) - patient LFTs normalized when is switched from Lipitor to Crestor, continued medication, recommended a protein supplement to help with albumin  05/30/2019 ([facility]) - chest pain, supposed to be musculoskeletal in nature, continued Xanax 0.5mg  for insomnia, given instruction on when to go to ER  Consult Visit:  07/21/2019 [facility], Cardiology) - follow up appt, no med changes, repeat echo due soon, 2019 showed mild aortic stenosis, had MI in July 2019, denies chest pain, dyspnea, edema, etc.  Medications: Outpatient Encounter Medications as of 02/15/2020  Medication Sig   budesonide (ENTOCORT EC) 3 MG 24 hr capsule Take 9 mg by mouth in the morning.   aspirin 81 MG tablet Take 81 mg by mouth at bedtime.   carvedilol (COREG) 3.125 MG tablet TAKE 1 TABLET BY MOUTH TWICE A DAY WITH MEALS   cholestyramine (QUESTRAN) 4 GM/DOSE powder USE 1 SCOOP 3 TIMES A DAY WITH A MEAL   losartan (COZAAR) 25 MG tablet TAKE 1/2 TABLET BY MOUTH EVERY DAY   Multiple Vitamins-Minerals (PRESERVISION AREDS PO) Take 1 tablet by mouth 2 (two) times daily.   nitroGLYCERIN (NITROSTAT) 0.4 MG SL tablet Place 1 tablet (0.4 mg total) under the tongue every 5 (five) minutes x 3 doses as needed for chest pain.   Omega-3 Fatty Acids (FISH OIL PO) Take 1 capsule by mouth at bedtime.   rosuvastatin (CRESTOR) 20 MG tablet Take 1 tablet (20 mg total) by mouth daily.   [DISCONTINUED] ALPRAZolam (XANAX) 0.5 MG tablet TAKE 1 TABLET BY MOUTH EVERY DAY AT BEDTIME AS NEEDED FOR SLEEP   No facility-administered encounter medications on file as of 02/15/2020.      Current Diagnosis/Assessment:    [employer]: Low Risk    Difficulty of Paying Living Expenses: Not hard at all     Goals Addressed             This Visit's Progress    Pharmacy Care Plan:       CARE PLAN ENTRY  Current Barriers:  Chronic Disease Management support, education, and care coordination needs related to Hyperlipidemia and Heart Failure   Hyperlipidemia Pharmacist Clinical Goal(s): Over the next 180 days, patient will work with PharmD and providers to maintain LDL goal < 70 Current regimen:  Rosuvastatin 20mg  Interventions: Comprehensive medication review Counseled on importance of low LDL with cardiovascular history Counseled on benefits of statin medications Patient self care activities - Over the next 180 days, patient will: Continue to take medication as prescribed Contact PharmD or PCP with any medication related health concerns  Heart Failure Pharmacist Clinical Goal(s) Over the next 180 days, patient will work with PharmD and providers to optimize medication related to HF. Current regimen:  Carvedilol 3.125 mg twice daily Losartan 25 mg daily Interventions: Comprehensive medication review Continue current therapy Counseled on importance of weight monitoring Patient self care activities - Over the next 180 days, patient will: Weigh themselves as directed by cardiologist Contact PharmD or PCP you gain more than 3 pounds in one day or 5 pounds in one week.  Please see past updates related to this goal by clicking on the "Past Updates" button  in the selected goal          Heart Failure   Type: Combined Systolic and Diastolic  Last ejection fraction: 55% NYHA Class: II (slight limitation of activity) AHA HF Stage: B (Heart disease present - no symptoms present)  Patient has failed these meds in past: metoprolol succinate 25mg  daily Patient is currently controlled on the following medications: carvedilol 3.125 mg twice  daily with meals losartan 25mg   We discussed weighing daily; if you gain more than 3 pounds in one day or 5 pounds in one week call your doctor. No signs of swelling currently.  He is not monitoring weight currently but is looking for swelling.  Counseled on importance of weight monitoring.  Plan  Continue current medications   Hyperlipidemia   Lipid Panel     Component Value Date/Time   CHOL 130 01/03/2020 1154   CHOL 112 11/09/2017 1440   TRIG 69 01/03/2020 1154   HDL 63 01/03/2020 1154   HDL 52 11/09/2017 1440   CHOLHDL 2.1 01/03/2020 1154   VLDL 23 10/07/2017 1628   LDLCALC 52 01/03/2020 1154   LABVLDL 17 11/09/2017 1440     Patient has failed these meds in past: none noted Patient is currently controlled on the following medications:  Rosuvastatin 20mg    We discussed:  Stressed importance of medication adherence He denies myalgias Lipid panel is excellent  Plan  Continue current medications  CAD    Patient has failed these meds in past: none noted Patient is currently controlled on the following medications: ASA 81mg   We discussed: He denies chest pain His nitroglycerin bottle was completely empty when he took it out of his pocket today.  [facility] for patient to pick up and have on hand.  Plan  Continue current medications  Anxiety    Patient has failed these meds in past: none noted Patient is currently controlled on the following medications:  No medications at this time  We discussed:   No longer having to use Xanax at bedtime, reports sleep is good unless he has to get up to use the bathroom he has a hard time falling asleep again.  Plan  Continue current medications. Notify providers if sleep disturbances continue  Phantom Limb Pain   Patient has failed these meds in past: none noted Patient is currently uncontrolled on the following medications:  None currently   We discussed:   He discussed experiencing some  phantom limb pain this past Sunday evening.  It is not the first time this has happened and it was a pretty bad case. Counseled on available medications for this should it keep having and causing discomfort. Patient to contact us with worsening or consistent symptoms.  Plan  Notify providers if limb pain continues or worsens.   Vaccines   Immunization History  Administered Date(s) Administered   Fluad Quad(high Dose 65+) 11/08/2018   Influenza, High Dose Seasonal PF 01/08/2017, 12/15/2017   Influenza,inj,Quad PF,6+ Mos 12/28/2012, 01/17/2014   Influenza-Unspecified 01/15/2016, 12/15/2017   PFIZER SARS-COV-2 Vaccination 04/08/2019, 04/28/2019   Pneumococcal Conjugate-13 09/21/2014   Pneumococcal Polysaccharide-23 05/09/2002   Tdap 07/29/2012   Zoster 07/07/2006   Zoster Recombinat (Shingrix) 05/27/2017, 07/30/2017    Plan  Up to date on all vaccinations.   Medication Management   Miscellaneous medications: alprazolam 0.5mg , Nitrostat 0.4 mg SL OTC's: Fish oil, preservision AREDS [facility].  Phone #  [phone] Introduced to [employer] and delivery/med synchronization  process, patient will discuss with wife. Patient reports using pill box method to organize medications and promote adherence. Patient denies missed doses of medication.   Beverly Milch, PharmD Clinical Pharmacist [place] [phone]  I have collaborated with the care management provider regarding care management and care coordination activities outlined in this encounter and have reviewed this encounter including documentation in the note and care plan. I am certifying that I agree with the content of this note and encounter as supervising physician.

## 2020-02-15 NOTE — Patient Instructions (Addendum)
Visit Information  Goals Addressed            This Visit's Progress   . Pharmacy Care Plan:       CARE PLAN ENTRY  Current Barriers:  . Chronic Disease Management support, education, and care coordination needs related to Hyperlipidemia and Heart Failure   Hyperlipidemia . Pharmacist Clinical Goal(s): o Over the next 180 days, patient will work with PharmD and providers to maintain LDL goal < 70 . Current regimen:  o Rosuvastatin 20mg  . Interventions: o Comprehensive medication review o Counseled on importance of low LDL with cardiovascular history o Counseled on benefits of statin medications . Patient self care activities - Over the next 180 days, patient will: o Continue to take medication as prescribed o Contact PharmD or PCP with any medication related health concerns  Heart Failure . Pharmacist Clinical Goal(s) o Over the next 180 days, patient will work with PharmD and providers to optimize medication related to HF. . Current regimen:  o Carvedilol 3.125 mg twice daily o Losartan 25 mg daily . Interventions: o Comprehensive medication review o Continue current therapy o Counseled on importance of weight monitoring . Patient self care activities - Over the next 180 days, patient will: o Weigh themselves as directed by cardiologist o Contact PharmD or PCP you gain more than 3 pounds in one day or 5 pounds in one week.  Please see past updates related to this goal by clicking on the "Past Updates" button in the selected goal         The patient verbalized understanding of instructions, educational materials, and care plan provided today and agreed to receive a mailed copy of patient instructions, educational materials, and care plan.   Telephone follow up appointment with pharmacy team member scheduled for: 6 months  Beverly Milch, PharmD Clinical Pharmacist [facility] [phone]   Heart Failure, Self Care Heart failure is a  serious condition. This sheet explains things you need to do to take care of yourself at home. To help you stay as healthy as possible, you may be asked to change your diet, take certain medicines, and make other changes in your life. Your doctor may also give you more specific instructions. If you have problems or questions, call your doctor. What are the risks? Having heart failure makes it more likely for you to have some problems. These problems can get worse if you do not take good care of yourself. Problems may include:  Blood clotting problems. This may cause a stroke.  Damage to the kidneys, liver, or lungs.  Abnormal heart rhythms. Supplies needed:  Scale for weighing yourself.  Blood pressure monitor.  Notebook.  Medicines. How to care for yourself when you have heart failure Medicines Take over-the-counter and prescription medicines only as told by your doctor. Take your medicines every day.  Do not stop taking your medicine unless your doctor tells you to do so.  Do not skip any medicines.  Get your prescriptions refilled before you run out of medicine. This is important. Eating and drinking   Eat heart-healthy foods. Talk with a diet specialist (dietitian) to create an eating plan.  Choose foods that: ? Have no trans fat. ? Are low in saturated fat and cholesterol.  Choose healthy foods, such as: ? Fresh or frozen fruits and vegetables. ? Fish. ? Low-fat (lean) meats. ? Legumes, such as beans, peas, and lentils. ? Fat-free or low-fat dairy products. ? Whole-grain foods. ? High-fiber foods.  Limit salt (sodium) if told by your doctor. Ask your diet specialist to tell you which seasonings are healthy for your heart.  Cook in healthy ways instead of frying. Healthy ways of cooking include roasting, grilling, broiling, baking, poaching, steaming, and stir-frying.  Limit how much fluid you drink, if told by your doctor. Alcohol use  Do not drink alcohol  if: ? Your doctor tells you not to drink. ? Your heart was damaged by alcohol, or you have very bad heart failure. ? You are pregnant, may be pregnant, or are planning to become pregnant.  If you drink alcohol: ? Limit how much you use to:  0-1 drink a day for women.  0-2 drinks a day for men. ? Be aware of how much alcohol is in your drink. In the [place] one drink equals one 12 oz bottle of beer (355 mL), one 5 oz glass of wine (148 mL), or one 1 oz glass of hard liquor (44 mL). Lifestyle   Do not use any products that contain nicotine or tobacco, such as cigarettes, e-cigarettes, and chewing tobacco. If you need help quitting, ask your doctor. ? Do not use nicotine gum or patches before talking to your doctor.  Do not use illegal drugs.  Lose weight if told by your doctor.  Do physical activity if told by your doctor. Talk to your doctor before you begin an exercise if: ? You are an older adult. ? You have very bad heart failure.  Learn to manage stress. If you need help, ask your doctor.  Get rehab (rehabilitation) to help you stay independent and to help with your quality of life.  Plan time to rest when you get tired. Check weight and blood pressure   Weigh yourself every day. This will help you to know if fluid is building up in your body. ? Weigh yourself every morning after you pee (urinate) and before you eat breakfast. ? Wear the same amount of clothing each time. ? Write down your daily weight. Give your record to your doctor.  Check and write down your blood pressure as told by your doctor.  Check your pulse as told by your doctor. Dealing with very hot and very cold weather  If it is very hot: ? Avoid activities that take a lot of energy. ? Use air conditioning or fans, or find a cooler place. ? Avoid caffeine and alcohol. ? Wear clothing that is loose-fitting, lightweight, and light-colored.  If it is very cold: ? Avoid activities that take a lot of  energy. ? Layer your clothes. ? Wear mittens or gloves, a hat, and a scarf when you go outside. ? Avoid alcohol. Follow these instructions at home:  Stay up to date with shots (vaccines). Get pneumococcal and flu (influenza) shots.  Keep all follow-up visits as told by your doctor. This is important. Contact a doctor if:  You gain weight quickly.  You have increasing shortness of breath.  You cannot do your normal activities.  You get tired easily.  You cough a lot.  You don't feel like eating or feel like you may vomit (nauseous).  You become puffy (swell) in your hands, feet, ankles, or belly (abdomen).  You cannot sleep well because it is hard to breathe.  You feel like your heart is beating fast (palpitations).  You get dizzy when you stand up. Get help right away if:  You have trouble breathing.  You or someone else notices a change in your  behavior, such as having trouble staying awake.  You have chest pain or discomfort.  You pass out (faint). These symptoms may be an emergency. Do not wait to see if the symptoms will go away. Get medical help right away. Call your local emergency services (911 in the [place] Do not drive yourself to the hospital. Summary  Heart failure is a serious condition. To care for yourself, you may have to change your diet, take medicines, and make other lifestyle changes.  Take your medicines every day. Do not stop taking them unless your doctor tells you to do so.  Eat heart-healthy foods, such as fresh or frozen fruits and vegetables, fish, lean meats, legumes, fat-free or low-fat dairy products, and whole-grain or high-fiber foods.  Ask your doctor if you can drink alcohol. You may have to stop alcohol use if you have very bad heart failure.  Contact your doctor if you gain weight quickly or feel that your heart is beating too fast. Get help right away if you pass out, or have chest pain or trouble breathing. This information is  not intended to replace advice given to you by your health care provider. Make sure you discuss any questions you have with your health care provider. Document Revised: 06/15/2018 Document Reviewed: 06/16/2018 Elsevier Patient Education  [place].

## 2020-02-22 ENCOUNTER — Other Ambulatory Visit: Payer: Self-pay

## 2020-02-22 ENCOUNTER — Ambulatory Visit (HOSPITAL_COMMUNITY): Payer: PPO | Attending: Internal Medicine

## 2020-02-22 DIAGNOSIS — E7800 Pure hypercholesterolemia, unspecified: Secondary | ICD-10-CM

## 2020-02-22 DIAGNOSIS — I2510 Atherosclerotic heart disease of native coronary artery without angina pectoris: Secondary | ICD-10-CM

## 2020-02-22 DIAGNOSIS — I350 Nonrheumatic aortic (valve) stenosis: Secondary | ICD-10-CM | POA: Diagnosis not present

## 2020-02-22 DIAGNOSIS — I255 Ischemic cardiomyopathy: Secondary | ICD-10-CM

## 2020-02-22 LAB — ECHOCARDIOGRAM COMPLETE
AR max vel: 1.41 cm2
AV Area VTI: 1.46 cm2
AV Area mean vel: 1.64 cm2
AV Mean grad: 12 mmHg
AV Peak grad: 20.1 mmHg
Ao pk vel: 2.24 m/s
Area-P 1/2: 2.37 cm2
S' Lateral: 2.3 cm

## 2020-03-11 ENCOUNTER — Other Ambulatory Visit: Payer: Self-pay | Admitting: Family Medicine

## 2020-03-24 ENCOUNTER — Other Ambulatory Visit: Payer: Self-pay | Admitting: Cardiovascular Disease

## 2020-03-29 IMAGING — DX CHEST - 2 VIEW
2 series · 2 of 2 positions shown · non-contrast
Comparison: October 07, 2017

CLINICAL DATA: Chest pain

EXAM:
CHEST - 2 VIEW

[w chest pa]
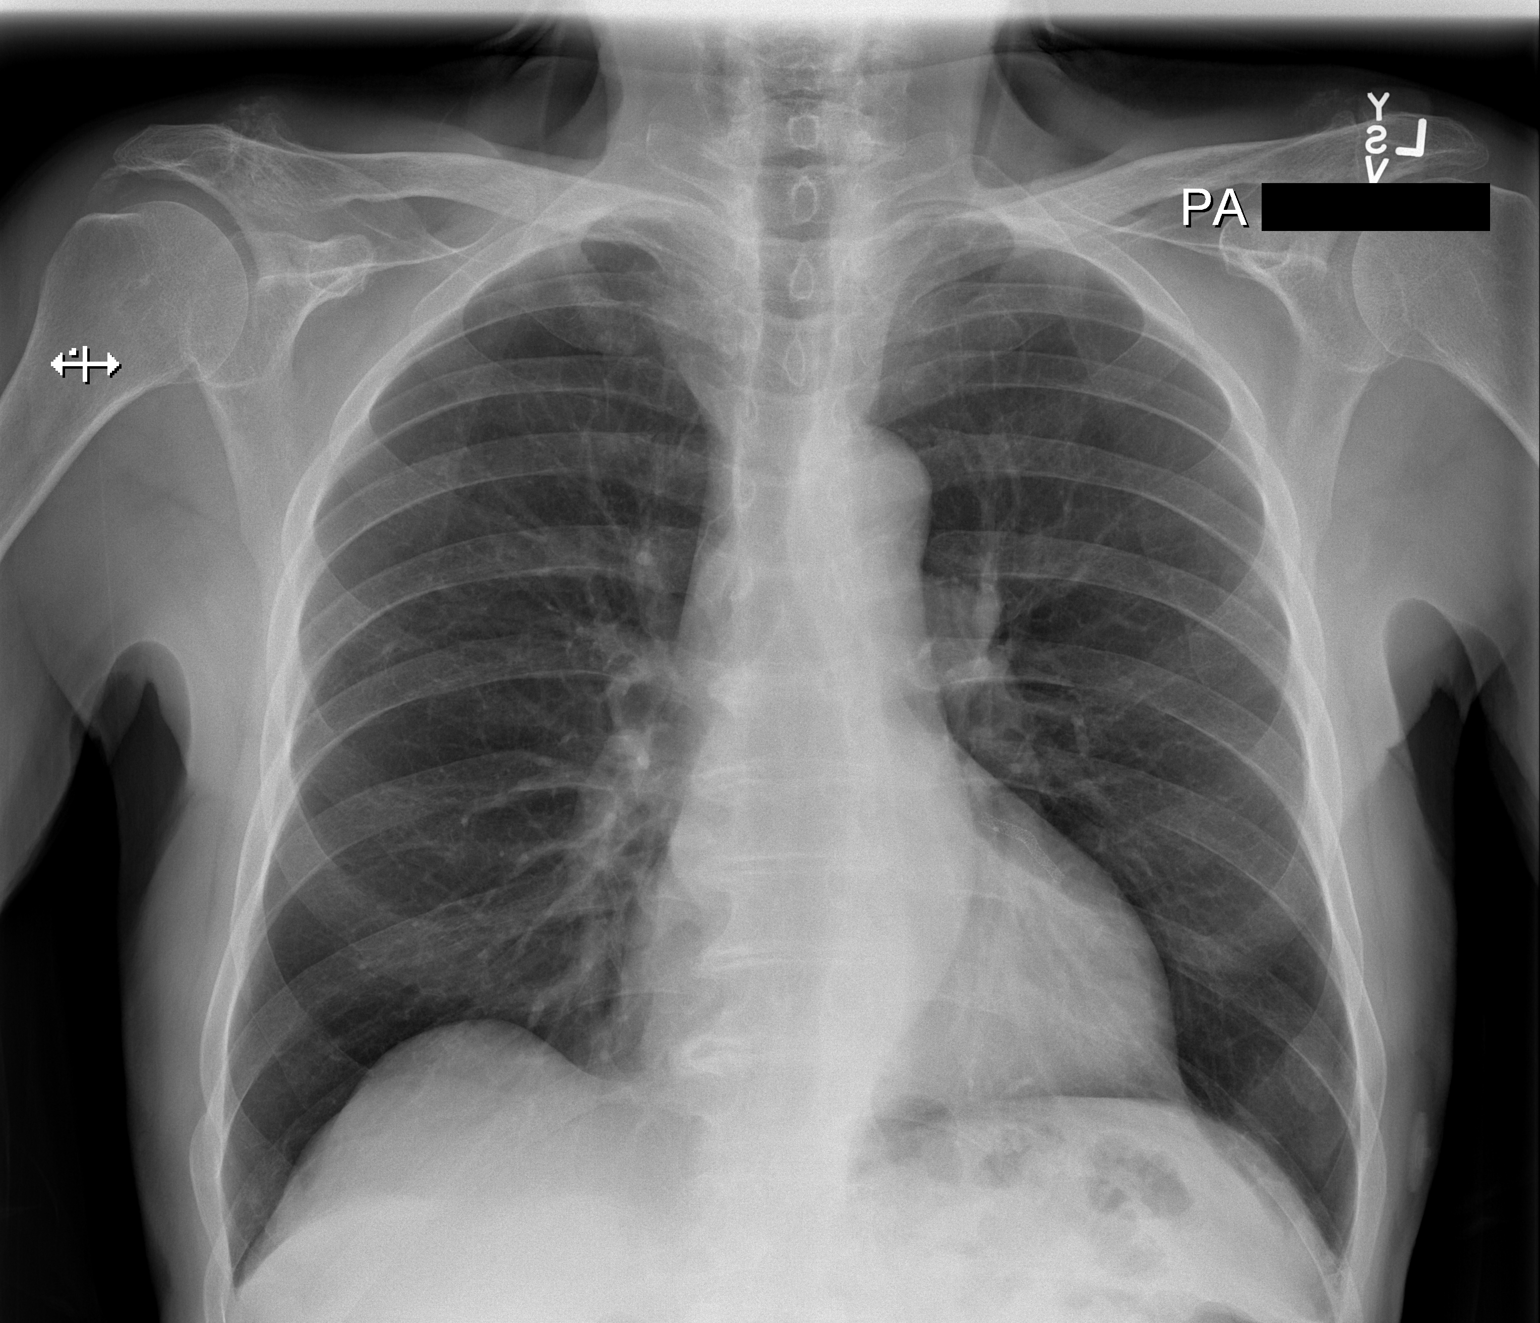

[w chest lat]
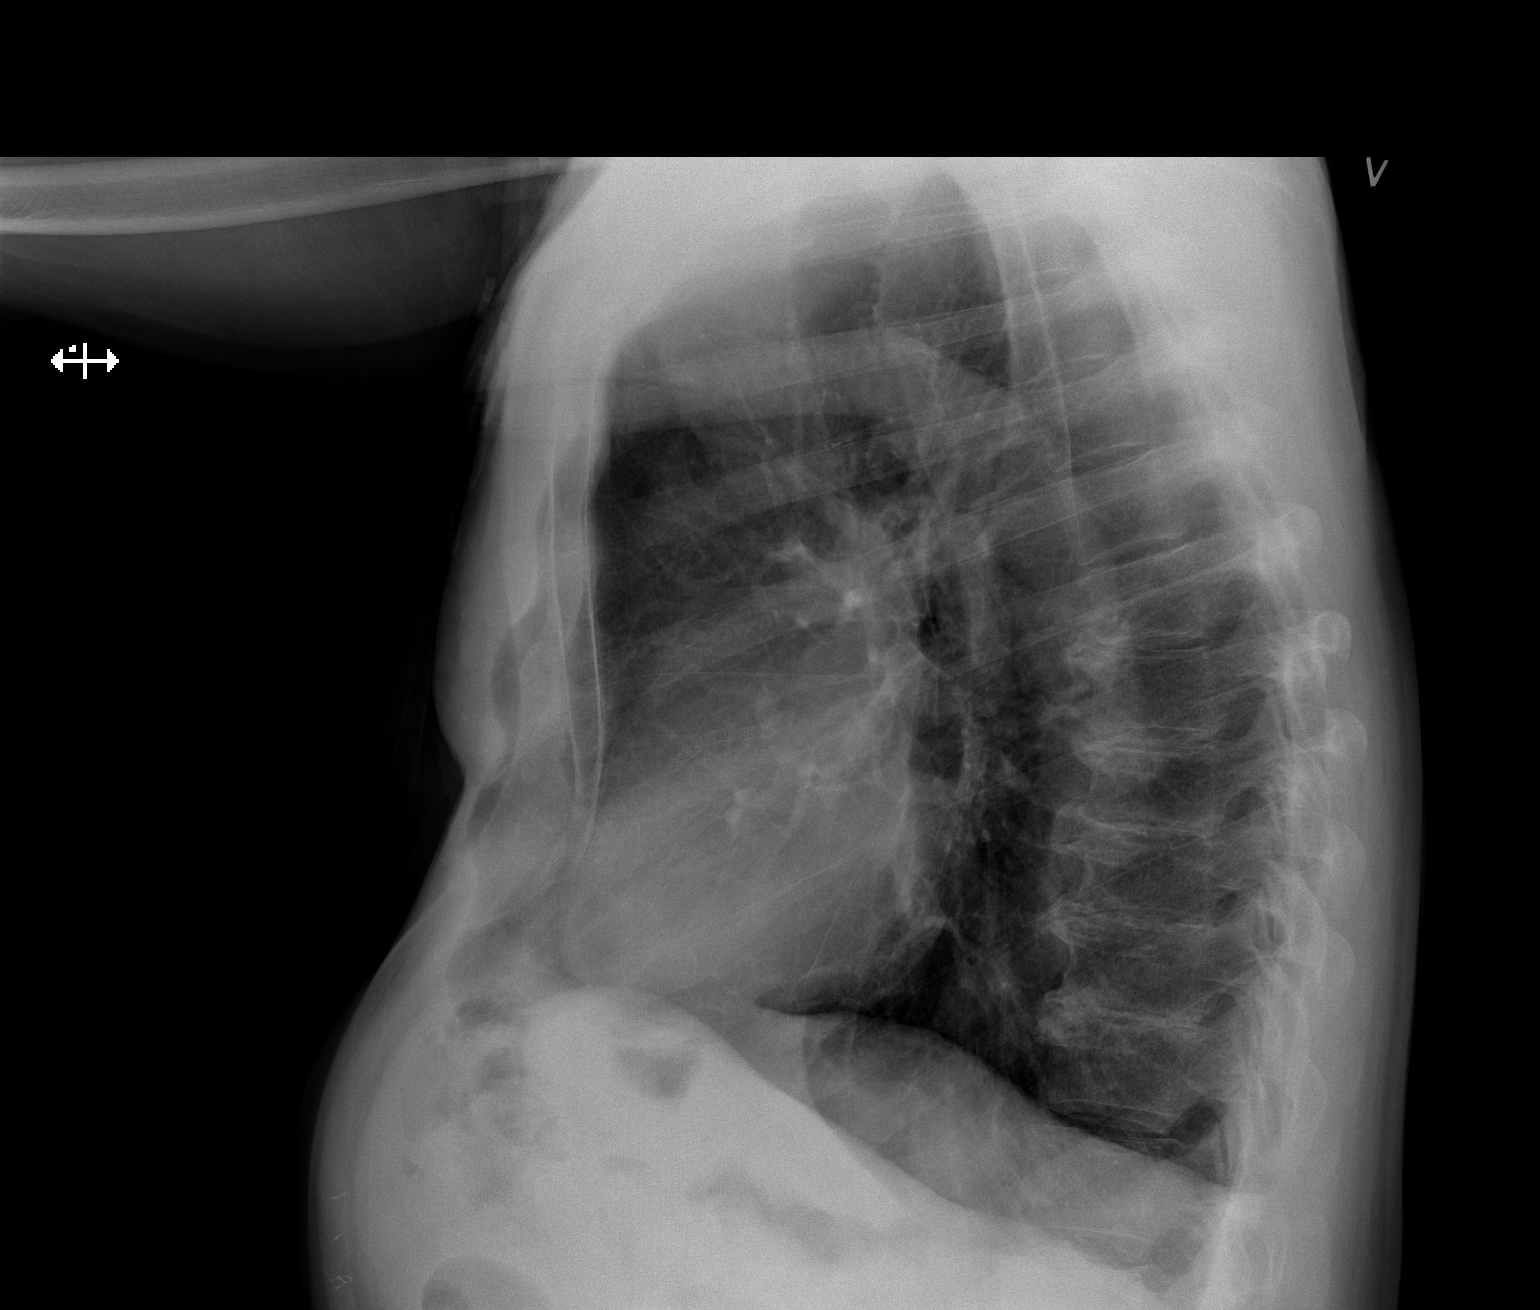

[2 of 2 positions shown; findings below may reference images not displayed]

FINDINGS: The heart size and mediastinal contours are within normal limits.
Both lungs are clear. The visualized skeletal structures are
unremarkable.
IMPRESSION: No active cardiopulmonary disease.

## 2020-04-09 DIAGNOSIS — H35412 Lattice degeneration of retina, left eye: Secondary | ICD-10-CM | POA: Diagnosis not present

## 2020-04-09 DIAGNOSIS — H353114 Nonexudative age-related macular degeneration, right eye, advanced atrophic with subfoveal involvement: Secondary | ICD-10-CM | POA: Diagnosis not present

## 2020-04-09 DIAGNOSIS — H35371 Puckering of macula, right eye: Secondary | ICD-10-CM | POA: Diagnosis not present

## 2020-04-09 DIAGNOSIS — H353122 Nonexudative age-related macular degeneration, left eye, intermediate dry stage: Secondary | ICD-10-CM | POA: Diagnosis not present

## 2020-05-16 ENCOUNTER — Ambulatory Visit: Payer: PPO | Admitting: Physician Assistant

## 2020-05-16 ENCOUNTER — Encounter: Payer: Self-pay | Admitting: Physician Assistant

## 2020-05-16 ENCOUNTER — Other Ambulatory Visit: Payer: Self-pay

## 2020-05-16 DIAGNOSIS — Z1283 Encounter for screening for malignant neoplasm of skin: Secondary | ICD-10-CM

## 2020-05-16 DIAGNOSIS — L570 Actinic keratosis: Secondary | ICD-10-CM | POA: Diagnosis not present

## 2020-05-25 ENCOUNTER — Telehealth: Payer: Self-pay | Admitting: Pharmacist

## 2020-05-25 NOTE — Progress Notes (Addendum)
Chronic Care Management Pharmacy Assistant   Name: Eugene [name]  MRN: [id number] DOB: 11-Dec-1931  Reason for Encounter: Disease State For CHL.    Conditions to be addressed/monitored: CAD, Systolic/diastolic HF, Hyperlipidemia, anxiety.  Recent office visits: None since 02/15/20  Recent consult visits:  05/16/20 Derm[facility] [place], [place]e Red, [place]. No medication changes.  Hospital visits:  None in previous 6 months  Medications: Outpatient Encounter Medications as of 05/25/2020  Medication Sig   aspirin 81 MG tablet Take 81 mg by mouth at bedtime.   budesonide (ENTOCORT EC) 3 MG 24 hr capsule Take 9 mg by mouth in the morning.   carvedilol (COREG) 3.125 MG tablet TAKE 1 TABLET BY MOUTH TWICE A DAY WITH MEALS   cholestyramine (QUESTRAN) 4 GM/DOSE powder USE 1 SCOOP 3 TIMES A DAY WITH A MEAL   losartan (COZAAR) 25 MG tablet TAKE 1/2 TABLET BY MOUTH EVERY DAY   Multiple Vitamins-Minerals (PRESERVISION AREDS PO) Take 1 tablet by mouth 2 (two) times daily.   nitroGLYCERIN (NITROSTAT) 0.4 MG SL tablet Place 1 tablet (0.4 mg total) under the tongue every 5 (five) minutes x 3 doses as needed for chest pain.   Omega-3 Fatty Acids (FISH OIL PO) Take 1 capsule by mouth at bedtime.   rosuvastatin (CRESTOR) 20 MG tablet TAKE 1 TABLET BY MOUTH EVERY DAY   No facility-administered encounter medications on file as of 05/25/2020.   05/25/2020 Name: ISON WICHMANN MRN: [id number] DOB: 1931/08/09 Eugene [name] is a 87 y.o. year old male who is a primary care patient of Susy Frizzle, MD.  Comprehensive medication review performed; Spoke to patient regarding cholesterol  Lipid Panel    Component Value Date/Time   CHOL 130 01/03/2020 1154   CHOL 112 11/09/2017 1440   TRIG 69 01/03/2020 1154   HDL 63 01/03/2020 1154   HDL 52 11/09/2017 1440   [place] 52 01/03/2020 1154    10-year ASCVD risk score: The ASCVD Risk score Mikey Bussing DC Jr., et al., 2013) failed to calculate for the following  reasons:   The 2013 ASCVD risk score is only valid for ages 64 to 86   The patient has a prior MI or stroke diagnosis  Current antihyperlipidemic regimen:  Rosuvastatin 20 mg 1 tablet daily  Previous antihyperlipidemic medications tried: None noted.  ASCVD risk enhancing conditions: age >38 and CHF   What recent interventions/DTPs have been made by any provider to improve Cholesterol control since last CPP Visit: None.  Any recent hospitalizations or ED visits since last visit with CPP? Patient stated no.  What diet changes have been made to improve Cholesterol?  Patient stated he eats fruit. He stated he eats some vegetables. He stated he tries to drink a small amount of water.   What exercise is being done to improve Cholesterol?  Patient stated he works outside and does yard work. 3-4 hours some days and some days just 1-2 hours.   Adherence Review: Does the patient have >5 day gap between last estimated fill dates? Per misc rpts, No.  Star Rating Drugs: Losartan 25 mg 1/2 tablet daily 05/19/20 90 DS , Rosuvastatin 20 mg 1 tablet daily 03/13/20 90 DS  Patient stated he does not have any questions or concerns about his medication at this time.   Follow-Up: Pharmacist Review  Charlann Lange, RMA Clinical Pharmacist Assistant [phone]  10 minutes spent in review, coordination, and documentation.  Reviewed by: Beverly Milch, PharmD Clinical Pharmacist [facility] (  [phone] ° °

## 2020-05-30 DIAGNOSIS — H53413 Scotoma involving central area, bilateral: Secondary | ICD-10-CM | POA: Diagnosis not present

## 2020-06-04 ENCOUNTER — Encounter: Payer: Self-pay | Admitting: Physician Assistant

## 2020-06-04 NOTE — Progress Notes (Signed)
   Follow-Up Visit   Subjective  Eugene [name] is a 87 y.o. male who presents for the following: Annual Exam (Scaly spots on back of left leg, face and back).   The following portions of the chart were reviewed this encounter and updated as appropriate:  Tobacco  Allergies  Meds  Problems  Med Hx  Surg Hx  Fam Hx      Objective  Well appearing patient in no apparent distress; mood and affect are within normal limits.  All skin waist up examined.  Objective  Left Frontal Scalp, Left Scaphoid Fossa, Left Upper Back, Right Lower Back, Right Zygomatic Area: Erythematous patches with gritty scale.  Objective  Left Breast: Waist up skin examination   Assessment & Plan  AK (actinic keratosis) (5) Left Upper Back; Right Lower Back; Left Scaphoid Fossa; Left Frontal Scalp; Right Zygomatic Area  Destruction of lesion - Left Frontal Scalp, Left Scaphoid Fossa, Left Upper Back, Right Lower Back, Right Zygomatic Area Complexity: simple   Destruction method: cryotherapy   Informed consent: discussed and consent obtained   Timeout:  patient name, date of birth, surgical site, and procedure verified Lesion destroyed using liquid nitrogen: Yes   Outcome: patient tolerated procedure well with no complications    Encounter for screening for malignant neoplasm of skin Left Breast  Yearly skin examination    I, Kristiana Jacko, PA-C, have reviewed all documentation's for this visit.  The documentation on 06/04/20 for the exam, diagnosis, procedures and orders are all accurate and complete.

## 2020-06-10 ENCOUNTER — Other Ambulatory Visit: Payer: Self-pay | Admitting: Family Medicine

## 2020-06-12 NOTE — Telephone Encounter (Signed)
Ok to refill??  Last office visit 09/29/2019.  Last refill 06/09/2019.  Of note, medication is not on current list.

## 2020-07-11 ENCOUNTER — Encounter: Payer: Self-pay | Admitting: Orthopedic Surgery

## 2020-07-11 ENCOUNTER — Ambulatory Visit (INDEPENDENT_AMBULATORY_CARE_PROVIDER_SITE_OTHER): Payer: PPO

## 2020-07-11 ENCOUNTER — Ambulatory Visit (INDEPENDENT_AMBULATORY_CARE_PROVIDER_SITE_OTHER): Payer: PPO | Admitting: Orthopedic Surgery

## 2020-07-11 DIAGNOSIS — M25561 Pain in right knee: Secondary | ICD-10-CM | POA: Diagnosis not present

## 2020-07-11 MED ORDER — LIDOCAINE HCL (PF) 1 % IJ SOLN
5.0000 mL | INTRAMUSCULAR | Status: AC | PRN
  Administered 2020-07-11: 5 mL

## 2020-07-11 MED ORDER — METHYLPREDNISOLONE ACETATE 40 MG/ML IJ SUSP
40.0000 mg | INTRAMUSCULAR | Status: AC | PRN
  Administered 2020-07-11: 40 mg via INTRA_ARTICULAR

## 2020-07-11 NOTE — Progress Notes (Signed)
Office Visit Note   Patient: Eugene [name]           Date of Birth: 1932/03/13           MRN: [id number] Visit Date: 07/11/2020              Requested by: Susy Frizzle, MD [address] [place],  [place] [address] PCP: Susy Frizzle, MD  Chief Complaint  Patient presents with  . Right Knee - Pain      HPI: Patient is an 87 year old gentleman who presents after acute pain right knee.  Patient states he was cutting trees down yesterday and began after this.  Patient complains of pain with weightbearing.  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  Assessment & Plan: Visit Diagnoses:  1. Acute pain of right knee     Plan: The right knee was injected he tolerated this well increase his activities as tolerated use ice or topical Voltaren gel.  Follow-Up Instructions: Return if symptoms worsen or fail to improve.   Ortho Exam  Patient is alert, oriented, no adenopathy, well-dressed, normal affect, normal respiratory effort. Examination patient has full range of motion of the right knee there is a mild effusion the residual limb is well consolidated with no ulcers or abrasions.  Patient is tender to palpation over the medial and lateral joint line collaterals and cruciates are stable.  Imaging: XR Knee 1-2 Views Right  Result Date: 07/11/2020 2 view radiographs of the right knee shows advanced osteoarthritis with essentially bone-on-bone contact and calcification of the meniscus.  No acute fractures.  No images are attached to the encounter.  Labs: Lab Results  Component Value Date   ESRSEDRATE 1 10/25/2013     Lab Results  Component Value Date   ALBUMIN 4.1 11/09/2017   ALBUMIN 3.9 10/07/2017   ALBUMIN 3.7 10/21/2016    No results found for: MG No results found for: VD25OH  No results found for: PREALBUMIN CBC EXTENDED Latest Ref Rng & Units 09/22/2019 10/25/2018 10/10/2017  WBC 3.8 - 10.8 Thousand/uL  7.0 7.0 9.6  RBC 4.20 - 5.80 Million/uL 4.13(L) 4.20(L) 4.14(L)  HGB 13.2 - 17.1 g/dL 13.5 13.4 13.4  HCT 38.5 - 50.0 % 40.6 40.8 38.8(L)  PLT 140 - 400 Thousand/uL 240 277 192  NEUTROABS 1,500 - 7,800 cells/uL 3,521 - -  LYMPHSABS 850 - 3,900 cells/uL 2,023 - -     There is no height or weight on file to calculate BMI.  Orders:  Orders Placed This Encounter  Procedures  . XR Knee 1-2 Views Right   No orders of the defined types were placed in this encounter.    Procedures: Large Joint Inj: R knee on 07/11/2020 4:32 PM Indications: pain and diagnostic evaluation Details: 22 G 1.5 in needle, anteromedial approach  Arthrogram: No  Medications: 5 mL lidocaine (PF) 1 %; 40 mg methylPREDNISolone acetate 40 MG/ML Outcome: tolerated well, no immediate complications Procedure, treatment alternatives, risks and benefits explained, specific risks discussed. Consent was given by the patient. Immediately prior to procedure a time out was called to verify the correct patient, procedure, equipment, support staff and site/side marked as required. Patient was prepped and draped in the usual sterile fashion.      Clinical Data: No additional findings.  ROS:  All other systems negative, except as noted in the HPI. Review of Systems  Objective: Vital Signs: There were  no vitals taken for this visit.  Specialty Comments:  No specialty comments available.  PMFS History: Patient Active Problem List   Diagnosis Date Noted  . CAD (coronary artery disease) 10/10/2017  . Ischemic cardiomyopathy   . Chronic combined systolic and diastolic heart failure ([place] [facility])   . Hyperlipidemia LDL goal <70   . Acute ST elevation myocardial infarction (STEMI) involving left anterior descending (LAD) coronary artery ([place]) 10/07/2017  . Collagenous colitis   . History of right below knee amputation ([facility]) 10/13/2013  . Sinus tachycardia 06/04/2012  . Anxiety state, unspecified 06/04/2012   Past Medical  History:  Diagnosis Date  . Amputated right leg ([place])   . CAD (coronary artery disease)    a. 09/2017 - acute MI -  emergent cath showing 99% stenosis in midLAD with thrombotic stenosis at the bifurcation of the second diagonal, which was relatively small in diameter. He underwent placement of DES to midLAD which jailed the second diagonal.   . Chronic combined systolic and diastolic heart failure ([facility])   . Collagenous colitis    Dr. Festus Holts  . History of ST elevation myocardial infarction (STEMI)    09/2017: LAD  . History of stomach ulcers   . Hyperlipidemia LDL goal <70   . Ischemic cardiomyopathy   . Squamous cell carcinoma of skin 03/27/2014   in situ-left temple (txpbx)  . Squamous cell carcinoma of skin 02/24/2018   in situ-left cheek ([id number])    Family History  Problem Relation Age of Onset  . Diabetes Sister   . Diabetes Brother   . Diabetes Sister   . Cancer Sister        breast    Past Surgical History:  Procedure Laterality Date  . BACK SURGERY    . CARDIAC CATHETERIZATION    . CORONARY STENT INTERVENTION N/A 10/07/2017   Procedure: CORONARY STENT INTERVENTION;  Surgeon: Wellington Hampshire, MD;  Location: [facility];  Service: Cardiovascular;  Laterality: N/A;  . CORONARY/GRAFT ACUTE MI REVASCULARIZATION N/A 10/07/2017   Procedure: Coronary/Graft Acute MI Revascularization;  Surgeon: Wellington Hampshire, MD;  Location: [facility];  Service: Cardiovascular;  Laterality: N/A;  . LEFT HEART CATH AND CORONARY ANGIOGRAPHY N/A 10/07/2017   Procedure: LEFT HEART CATH AND CORONARY ANGIOGRAPHY;  Surgeon: Wellington Hampshire, MD;  Location: [facility];  Service: Cardiovascular;  Laterality: N/A;  . LEG AMPUTATION     Right Below knee  . STOMACH SURGERY     Billroth II gastrojejunostomy  . TOTAL KNEE ARTHROPLASTY     Social History   Occupational History  . Occupation: Retired  Tobacco Use  . Smoking status: Former Smoker    Packs/day: 1.00     Years: 8.00    Pack years: 8.00    Types: Cigarettes  . Smokeless tobacco: Never Used  Vaping Use  . Vaping Use: Never used  Substance and Sexual Activity  . Alcohol use: Yes    Comment: occasional  . Drug use: No  . Sexual activity: Yes    Comment: married

## 2020-07-19 DIAGNOSIS — L821 Other seborrheic keratosis: Secondary | ICD-10-CM | POA: Diagnosis not present

## 2020-07-19 DIAGNOSIS — D225 Melanocytic nevi of trunk: Secondary | ICD-10-CM | POA: Diagnosis not present

## 2020-07-19 DIAGNOSIS — B079 Viral wart, unspecified: Secondary | ICD-10-CM | POA: Diagnosis not present

## 2020-07-19 DIAGNOSIS — L570 Actinic keratosis: Secondary | ICD-10-CM | POA: Diagnosis not present

## 2020-07-19 DIAGNOSIS — L820 Inflamed seborrheic keratosis: Secondary | ICD-10-CM | POA: Diagnosis not present

## 2020-07-19 DIAGNOSIS — D485 Neoplasm of uncertain behavior of skin: Secondary | ICD-10-CM | POA: Diagnosis not present

## 2020-07-26 DIAGNOSIS — H53413 Scotoma involving central area, bilateral: Secondary | ICD-10-CM | POA: Diagnosis not present

## 2020-08-15 NOTE — Progress Notes (Deleted)
Chronic Care Management Pharmacy Note  08/15/2020 Name:  Eugene [name] MRN:  [id number] DOB:  12/29/1931  Summary: ***  Recommendations/Changes made from today's visit: ***  Plan: ***  Subjective: Eugene [name] is an 87 y.o. year old male who is a primary patient of Pickard, Cammie Mcgee, MD.  The CCM team was consulted for assistance with disease management and care coordination needs.    Engaged with patient by telephone for follow up visit in response to provider referral for pharmacy case management and/or care coordination services.   Consent to Services:  The patient was given the following information about Chronic Care Management services today, agreed to services, and gave verbal consent: 1. CCM service includes personalized support from designated clinical staff supervised by the primary care provider, including individualized plan of care and coordination with other care providers 2. 24/7 contact phone numbers for assistance for urgent and routine care needs. 3. Service will only be billed when office clinical staff spend 20 minutes or more in a month to coordinate care. 4. Only one practitioner may furnish and bill the service in a calendar month. 5.The patient may stop CCM services at any time (effective at the end of the month) by phone call to the office staff. 6. The patient will be responsible for cost sharing (co-pay) of up to 20% of the service fee (after annual deductible is met). Patient agreed to services and consent obtained.  Patient Care Team: Susy Frizzle, MD as PCP - General (Family Medicine) Burnell Blanks, MD as PCP - Cardiology (Cardiology) Edythe Clarity, [facility] [place] as Pharmacist (Pharmacist) Warren Danes, PA-C as Physician Assistant (Dermatology)  Recent office visits: None since last CCM call  Recent consult visits: 07/11/20 Sharol Given, Ortho) - acute R knee pain. X-ray ordered, shows bone on bone osteoarthritis but no fractures  present.  Hospital visits: {Hospital DC Yes/No:25215}   Objective:  Lab Results  Component Value Date   CREATININE 0.84 01/03/2020   BUN 11 01/03/2020   GFRNONAA 79 01/03/2020   GFRAA 92 01/03/2020   NA 139 01/03/2020   K 3.9 01/03/2020   CALCIUM 8.5 (L) 01/03/2020   CO2 28 01/03/2020   GLUCOSE 83 01/03/2020    No results found for: HGBA1C, FRUCTOSAMINE, GFR, MICROALBUR  Last diabetic Eye exam: No results found for: HMDIABEYEEXA  Last diabetic Foot exam: No results found for: HMDIABFOOTEX   Lab Results  Component Value Date   CHOL 130 01/03/2020   HDL 63 01/03/2020   LDLCALC 52 01/03/2020   TRIG 69 01/03/2020   CHOLHDL 2.1 01/03/2020    Hepatic Function Latest Ref Rng & Units 01/03/2020 10/06/2019 09/29/2019  Total Protein 6.1 - 8.1 g/dL 5.4(L) 5.9(L) 6.0(L)  Albumin 3.5 - 4.7 g/dL - - -  AST 10 - 35 U/L 25 22 38(H)  ALT 9 - 46 U/L 30 25 44  Alk Phosphatase 39 - 117 IU/L - - -  Total Bilirubin 0.2 - 1.2 mg/dL 0.8 0.9 1.0  Bilirubin, Direct 0.00 - 0.40 mg/dL - - -    Lab Results  Component Value Date/Time   TSH 1.60 10/23/2015 08:23 AM   TSH 1.127 09/13/2014 08:46 AM    CBC Latest Ref Rng & Units 09/22/2019 10/25/2018 10/10/2017  WBC 3.8 - 10.8 Thousand/uL 7.0 7.0 9.6  Hemoglobin 13.2 - 17.1 g/dL 13.5 13.4 13.4  Hematocrit 38.5 - 50.0 % 40.6 40.8 38.8(L)  Platelets 140 - 400 Thousand/uL 240 277 192    No  results found for: VD25OH  Clinical ASCVD: {YES/NO:21197} The ASCVD Risk score Mikey Bussing DC Jr., et al., 2013) failed to calculate for the following reasons:   The 2013 ASCVD risk score is only valid for ages 27 to 78   The patient has a prior MI or stroke diagnosis    Depression screen [facility] 2/9 09/29/2019 11/08/2018 03/29/2018  Decreased Interest 0 0 0  Down, Depressed, Hopeless 0 0 0  PHQ - 2 Score 0 0 0  Altered sleeping - - -  Tired, decreased energy - - -  Change in appetite - - -  Feeling bad or failure about yourself  - - -  Trouble concentrating - - -   Moving slowly or fidgety/restless - - -  Suicidal thoughts - - -  PHQ-9 Score - - -  Difficult doing work/chores - - -     ***Other: (CHADS2VASc if Afib, MMRC or CAT for COPD, ACT, DEXA)  Social History   Tobacco Use  Smoking Status Former Smoker  . Packs/day: 1.00  . Years: 8.00  . Pack years: 8.00  . Types: Cigarettes  Smokeless Tobacco Never Used   BP Readings from Last 3 Encounters:  09/29/19 128/70  07/21/19 108/60  05/30/19 136/60   Pulse Readings from Last 3 Encounters:  09/29/19 72  07/21/19 65  05/30/19 64   Wt Readings from Last 3 Encounters:  09/29/19 147 lb (66.7 kg)  07/21/19 162 lb (73.5 kg)  05/30/19 148 lb (67.1 kg)   BMI Readings from Last 3 Encounters:  09/29/19 21.09 kg/m  07/21/19 23.24 kg/m  05/30/19 21.24 kg/m    Assessment/Interventions: Review of patient past medical history, allergies, medications, health status, including review of consultants reports, laboratory and other test data, was performed as part of comprehensive evaluation and provision of chronic care management services.   SDOH:  (Social Determinants of Health) assessments and interventions performed: {yes/no:20286}  SDOH Screenings   Alcohol Screen: Not on file  Depression (PHQ2-9): Low Risk   . PHQ-2 Score: 0  Financial Resource Strain: Not on file  Food Insecurity: Not on file  Housing: Not on file  Physical Activity: Not on file  Social Connections: Not on file  Stress: Not on file  Tobacco Use: Medium Risk  . Smoking Tobacco Use: Former Smoker  . Smokeless Tobacco Use: Never Used  Transportation Needs: Not on file    CCM Care Plan  Allergies  Allergen Reactions  . Aleve [Naproxen Sodium] Nausea And Vomiting    No problem with other NSAIDs  . Amoxicillin Other (See Comments)    Did it involve swelling of the face/tongue/throat, SOB, or low BP? No Did it involve sudden or severe rash/hives, skin peeling, or any reaction on the inside of your mouth or  nose? No Did you need to seek medical attention at a hospital or doctor's office? unknown When did it last happen?yrs ago dizziness If all above answers are "NO", may proceed with cephalosporin use.   Marland Kitchen Morphine And Related     dizzy  . Penicillins     Diarrhea Did it involve swelling of the face/tongue/throat, SOB, or low BP? No Did it involve sudden or severe rash/hives, skin peeling, or any reaction on the inside of your mouth or nose? No Did you need to seek medical attention at a hospital or doctor's office? Unknown When did it last happen?years ago If all above answers are "NO", may proceed with cephalosporin use.     Medications Reviewed Today  Reviewed by Newt Minion, MD (Physician) on 07/11/20 at 1630  Med List Status: <None>  Medication Order Taking? Sig Documenting Provider Last Dose Status Informant  ALPRAZolam (XANAX) 0.5 MG tablet [id number]  TAKE 1 TABLET BY MOUTH AT BEDTIME AS NEEDED FOR SLEEP Susy Frizzle, MD  Active   aspirin 81 MG tablet [id number] No Take 81 mg by mouth at bedtime. [provider] Taking Active Multiple Informants           Med Note Landry Mellow, Roxana Hires Feb 04, 2018 10:19 AM)    budesonide (ENTOCORT EC) 3 MG 24 hr capsule [id number] No Take 9 mg by mouth in the morning. Carol Ada, MD Taking Active   carvedilol (COREG) 3.125 MG tablet [id number] No TAKE 1 TABLET BY MOUTH TWICE A DAY WITH MEALS Burnell Blanks, MD Taking Active   cholestyramine Lucrezia Starch) 4 GM/DOSE powder [id number] No USE 1 SCOOP 3 TIMES A DAY WITH A MEAL Pickard, Cammie Mcgee, MD Taking Active   losartan (COZAAR) 25 MG tablet [id number] No TAKE 1/2 TABLET BY MOUTH EVERY DAY Burnell Blanks, MD Taking Active   Multiple Vitamins-Minerals (PRESERVISION AREDS PO) [id number] No Take 1 tablet by mouth 2 (two) times daily. [provider] Taking Active Multiple Informants  nitroGLYCERIN (NITROSTAT) 0.4 MG SL tablet [id number] No Place 1  tablet (0.4 mg total) under the tongue every 5 (five) minutes x 3 doses as needed for chest pain. Susy Frizzle, MD Taking Active   Omega-3 Fatty Acids (FISH OIL PO) [id number] No Take 1 capsule by mouth at bedtime. [provider] Taking Active Multiple Informants  rosuvastatin (CRESTOR) 20 MG tablet [id number] No TAKE 1 TABLET BY MOUTH EVERY DAY Susy Frizzle, MD Taking Active           Patient Active Problem List   Diagnosis Date Noted  . CAD (coronary artery disease) 10/10/2017  . Ischemic cardiomyopathy   . Chronic combined systolic and diastolic heart failure ([place])   . Hyperlipidemia LDL goal <70   . Acute ST elevation myocardial infarction (STEMI) involving left anterior descending (LAD) coronary artery ([place]) 10/07/2017  . Collagenous colitis   . History of right below knee amputation ([facility]) 10/13/2013  . Sinus tachycardia 06/04/2012  . Anxiety state, unspecified 06/04/2012    Immunization History  Administered Date(s) Administered  . Fluad Quad(high Dose 65+) 11/08/2018  . Influenza, High Dose Seasonal PF 01/08/2017, 12/15/2017  . Influenza,inj,Quad PF,6+ Mos 12/28/2012, 01/17/2014  . Influenza-Unspecified 01/15/2016, 12/15/2017  . PFIZER(Purple Top)SARS-COV-2 Vaccination 04/08/2019, 04/28/2019  . Pneumococcal Conjugate-13 09/21/2014  . Pneumococcal Polysaccharide-23 05/09/2002  . Tdap 07/29/2012  . Zoster Recombinat (Shingrix) 05/27/2017, 07/30/2017  . Zoster, Live 07/07/2006    Conditions to be addressed/monitored:  CAD, Systolic/diastolic HF, Hyperlipidemia, anxiety  There are no care plans that you recently modified to display for this patient.    Medication Assistance: {MEDASSISTANCEINFO:25044}  Compliance/Adherence/Medication fill history: Care Gaps: ***  Star-Rating Drugs: ***  Patient's preferred pharmacy is:  [id number] [place] [address] [address]Phone: [phone]: [phone] Uses pill box? {Yes or If no, why not?:20788} Pt endorses ***% compliance  We discussed: {Pharmacy options:24294} Patient decided to: {US Pharmacy Plan:23885}  Care Plan and Follow Up Patient Decision:  {FOLLOWUP:24991}  Plan: {CM FOLLOW UP PLPFX:90240} *** Current Barriers:  . {pharmacybarriers:24917}  Pharmacist Clinical Goal(s):  .Marland KitchenPatient will {PHARMACYGOALCHOICES:24921} through  collaboration with PharmD and provider.   Interventions: . 1:1 collaboration with Susy Frizzle, MD regarding development and update of comprehensive plan of care as evidenced by provider attestation and co-signature . Inter-disciplinary care team collaboration (see longitudinal plan of care) . Comprehensive medication review performed; medication list updated in electronic medical record  Hyperlipidemia/CAD: (LDL goal < ***) -{US controlled/uncontrolled:25276} -Current treatment: . *** -Medications previously tried: ***  -Current dietary patterns: *** -Current exercise habits: *** -Educated on {CCM HLD Counseling:25126} -{CCMPHARMDINTERVENTION:25122}  Heart Failure (Goal: manage symptoms and prevent exacerbations) -{US controlled/uncontrolled:25276} -Last ejection fraction: *** (Date: ***) -HF type: {type of heart failure:30421350} -NYHA Class: {CHL HP Upstream Pharm NYHA Class:[phone]} -AHA HF Stage: {CHL HP Upstream Pharm AHA HF Stage:[phone]} -Current treatment: . *** -Medications previously tried: ***  -Current home BP/HR readings: *** -Current dietary habits: *** -Current exercise habits: *** -Educated on {CCM HF Counseling:25125} -{CCMPHARMDINTERVENTION:25122}  Anxiety (Goal: ***) -{US controlled/uncontrolled:25276} -Current treatment: . *** -Medications previously tried/failed: *** -PHQ9: *** -GAD7: *** -Connected with *** for mental health support -Educated on {CCM mental health  counseling:25127} -{CCMPHARMDINTERVENTION:25122}   Patient Goals/Self-Care Activities . Patient will:  - {pharmacypatientgoals:24919}  Follow Up Plan: {CM FOLLOW UP ZHYQ:65784}

## 2020-08-15 NOTE — Progress Notes (Signed)
Subjective:   Eugene [name] is a 87 y.o. male who presents for Medicare Annual/Subsequent preventive examination.  Review of Systems    N/A  Cardiac Risk Factors include: advanced age (>90men, >57 women);hypertension;male gender;dyslipidemia     Objective:    Today's Vitals   08/16/20 1546 08/16/20 1547  BP: 104/60   Temp: 98.1 F (36.7 C)   TempSrc: Oral   Weight: 149 lb 2 oz (67.6 kg)   Height: 5\' 10"  (1.778 m)   PainSc:  5    Body mass index is 21.4 kg/m.  Advanced Directives 08/16/2020 09/29/2019 10/08/2017  Does Patient Have a Medical Advance Directive? Yes Yes Yes  Type of Paramedic of [place];Living will Parole;Living will Living will  Does patient want to make changes to medical advance directive? No - Patient declined - No - Patient declined  Copy of Deal in Chart? No - copy requested - -    Current Medications (verified) Outpatient Encounter Medications as of 08/16/2020  Medication Sig  . ALPRAZolam (XANAX) 0.5 MG tablet TAKE 1 TABLET BY MOUTH AT BEDTIME AS NEEDED FOR SLEEP  . aspirin 81 MG tablet Take 81 mg by mouth at bedtime.  . budesonide (ENTOCORT EC) 3 MG 24 hr capsule Take 9 mg by mouth in the morning.  . carvedilol (COREG) 3.125 MG tablet TAKE 1 TABLET BY MOUTH TWICE A DAY WITH MEALS  . cholestyramine (QUESTRAN) 4 GM/DOSE powder USE 1 SCOOP 3 TIMES A DAY WITH A MEAL  . fluorouracil (EFUDEX) 5 % cream SMARTSIG:Sparingly Topical Twice Daily  . losartan (COZAAR) 25 MG tablet TAKE 1/2 TABLET BY MOUTH EVERY DAY  . Multiple Vitamins-Minerals (PRESERVISION AREDS PO) Take 1 tablet by mouth 2 (two) times daily.  . Omega-3 Fatty Acids (FISH OIL PO) Take 1 capsule by mouth at bedtime.  . rosuvastatin (CRESTOR) 20 MG tablet TAKE 1 TABLET BY MOUTH EVERY DAY  . nitroGLYCERIN (NITROSTAT) 0.4 MG SL tablet Place 1 tablet (0.4 mg total) under the tongue every 5 (five) minutes x 3 doses as needed for chest  pain. (Patient not taking: Reported on 08/16/2020)   No facility-administered encounter medications on file as of 08/16/2020.    Allergies (verified) Aleve [naproxen sodium], Amoxicillin, Morphine and related, and Penicillins   History: Past Medical History:  Diagnosis Date  . Amputated right leg ([place])   . CAD (coronary artery disease)    a. 09/2017 - acute MI -  emergent cath showing 99% stenosis in midLAD with thrombotic stenosis at the bifurcation of the second diagonal, which was relatively small in diameter. He underwent placement of DES to midLAD which jailed the second diagonal.   . Chronic combined systolic and diastolic heart failure ([place])   . Collagenous colitis    Dr. Festus Holts  . History of ST elevation myocardial infarction (STEMI)    09/2017: LAD  . History of stomach ulcers   . Hyperlipidemia LDL goal <70   . Ischemic cardiomyopathy   . Squamous cell carcinoma of skin 03/27/2014   in situ-left temple (txpbx)  . Squamous cell carcinoma of skin 02/24/2018   in situ-left cheek ([id number])   Past Surgical History:  Procedure Laterality Date  . BACK SURGERY    . CARDIAC CATHETERIZATION    . CORONARY STENT INTERVENTION N/A 10/07/2017   Procedure: CORONARY STENT INTERVENTION;  Surgeon: Wellington Hampshire, MD;  Location: [facility];  Service: Cardiovascular;  Laterality: N/A;  . CORONARY/GRAFT  ACUTE MI REVASCULARIZATION N/A 10/07/2017   Procedure: Coronary/Graft Acute MI Revascularization;  Surgeon: Wellington Hampshire, MD;  Location: [place] [facility];  Service: Cardiovascular;  Laterality: N/A;  . LEFT HEART CATH AND CORONARY ANGIOGRAPHY N/A 10/07/2017   Procedure: LEFT HEART CATH AND CORONARY ANGIOGRAPHY;  Surgeon: Wellington Hampshire, MD;  Location: [facility];  Service: Cardiovascular;  Laterality: N/A;  . LEG AMPUTATION     Right Below knee  . STOMACH SURGERY     Billroth II gastrojejunostomy  . TOTAL KNEE ARTHROPLASTY     Family History  Problem Relation  Age of Onset  . Diabetes Sister   . Diabetes Brother   . Diabetes Sister   . Cancer Sister        breast   Social History   Socioeconomic History  . Marital status: Married    Spouse name: Financial risk analyst  . Number of children: Not on file  . Years of education: 20  . Highest education level: High school graduate  Occupational History  . Occupation: Retired  Tobacco Use  . Smoking status: Former Smoker    Packs/day: 1.00    Years: 8.00    Pack years: 8.00    Types: Cigarettes  . Smokeless tobacco: Never Used  Vaping Use  . Vaping Use: Never used  Substance and Sexual Activity  . Alcohol use: Yes    Comment: occasional  . Drug use: No  . Sexual activity: Yes    Comment: married  Other Topics Concern  . Not on file  Social History Narrative  . Not on file   Social Determinants of Health   Financial Resource Strain: Low Risk   . Difficulty of Paying Living Expenses: Not hard at all  Food Insecurity: No Food Insecurity  . Worried About Charity fundraiser in the Last Year: Never true  . Ran Out of Food in the Last Year: Never true  Transportation Needs: No Transportation Needs  . Lack of Transportation (Medical): No  . Lack of Transportation (Non-Medical): No  Physical Activity: Inactive  . Days of Exercise per Week: 0 days  . Minutes of Exercise per Session: 0 min  Stress: No Stress Concern Present  . Feeling of Stress : Not at all  Social Connections: Moderately Integrated  . Frequency of Communication with Friends and Family: More than three times a week  . Frequency of Social Gatherings with Friends and Family: More than three times a week  . Attends Religious Services: More than 4 times per year  . Active Member of Clubs or Organizations: No  . Attends Archivist Meetings: Never  . Marital Status: Married    Tobacco Counseling Counseling given: Not Answered   Clinical Intake:  Pre-visit preparation completed: Yes  Pain : 0-10 Pain Score: 5  Pain  Type: Chronic pain Pain Location: Back (knee) Pain Orientation: Lower,Right Pain Descriptors / Indicators: Aching Pain Onset: More than a month ago Pain Frequency: Intermittent Pain Relieving Factors: back brace  Pain Relieving Factors: back brace  Diabetes: No  How often do you need to have someone help you when you read instructions, pamphlets, or other written materials from your doctor or pharmacy?: 1 - Never  Diabetic?No  Interpreter Needed?: No  Information entered by :: [place] of Daily Living In your present state of health, do you have any difficulty performing the following activities: 08/16/2020  Hearing? N  Vision? N  Difficulty concentrating or making decisions? N  Walking or climbing stairs? N  Dressing or bathing? N  Doing errands, shopping? N  Preparing Food and eating ? N  Using the Toilet? N  In the past six months, have you accidently leaked urine? N  Do you have problems with loss of bowel control? N  Managing your Medications? Y  Managing your Finances? N  Housekeeping or managing your Housekeeping? N  Some recent data might be hidden    Patient Care Team: Susy Frizzle, MD as PCP - General (Family Medicine) Burnell Blanks, MD as PCP - Cardiology (Cardiology) Edythe Clarity, [facility] as Pharmacist (Pharmacist) Warren Danes, PA-C as Physician Assistant (Dermatology)  Indicate any recent Medical Services you may have received from other than [facility] providers in the past year (date may be approximate).     Assessment:   This is a routine wellness examination for Eugene [name].  Hearing/Vision screen  Hearing Screening   125Hz  250Hz  500Hz  1000Hz  2000Hz  3000Hz  4000Hz  6000Hz  8000Hz   Right ear:           Left ear:           Vision Screening Comments: Patient states gets eyes examined once year. Currently wears reading glasses   Dietary issues and exercise activities discussed: Current Exercise Habits: The patient does not  participate in regular exercise at present, Exercise limited by: orthopedic condition(s)  Goals Addressed            This Visit's Progress   . Patient Stated       I would like to continue to maintain my current state of health    . Prevent falls        Depression Screen PHQ 2/9 Scores 08/16/2020 09/29/2019 11/08/2018 03/29/2018 12/28/2017 11/03/2017 11/03/2017  PHQ - 2 Score 0 0 0 0 0 0 0  PHQ- 9 Score - - - - - - -    Fall Risk Fall Risk  08/16/2020 10/11/2019 09/29/2019 12/22/2017 11/03/2017  Falls in the past year? 1 1 0 No No  Comment - Emmi Telephone Survey: data to providers prior to load - - -  Number falls in past yr: 1 1 0 - -  Comment - Emmi Telephone Survey Actual Response = 1 - - -  Injury with Fall? 0 0 0 - -  Risk for fall due to : Orthopedic patient - - - -  Follow up Falls evaluation completed;Falls prevention discussed - - - -    FALL RISK PREVENTION PERTAINING TO THE HOME:  Any stairs in or around the home? Yes  If so, are there any without handrails? No  Home free of loose throw rugs in walkways, pet beds, electrical cords, etc? Yes  Adequate lighting in your home to reduce risk of falls? Yes   ASSISTIVE DEVICES UTILIZED TO PREVENT FALLS:  Life alert? No  Use of a cane, walker or w/c? No  Grab bars in the bathroom? Yes  Shower chair or bench in shower? Yes  Elevated toilet seat or a handicapped toilet? Yes   TIMED UP AND GO:  Was the test performed? Yes .  Length of time to ambulate 10 feet: 4 sec.   Gait steady and fast without use of assistive device  Cognitive Function:        Immunizations Immunization History  Administered Date(s) Administered  . Fluad Quad(high Dose 65+) 11/08/2018  . Influenza, High Dose Seasonal PF 01/08/2017, 12/15/2017  . Influenza,inj,Quad PF,6+ Mos 12/28/2012, 01/17/2014  . Influenza-Unspecified 01/15/2016, 12/15/2017  .  PFIZER(Purple Top)SARS-COV-2 Vaccination 04/08/2019, 04/28/2019, 12/27/2019, 01/23/2020  .  Pneumococcal Conjugate-13 09/21/2014  . Pneumococcal Polysaccharide-23 05/09/2002  . Tdap 07/29/2012  . Zoster Recombinat (Shingrix) 05/27/2017, 07/30/2017  . Zoster, Live 07/07/2006    TDAP status: Up to date  Flu Vaccine status: Due, Education has been provided regarding the importance of this vaccine. Advised may receive this vaccine at local pharmacy or Health Dept. Aware to provide a copy of the vaccination record if obtained from local pharmacy or Health Dept. Verbalized acceptance and understanding.  Pneumococcal vaccine status: Up to date  Covid-19 vaccine status: Completed vaccines  Qualifies for Shingles Vaccine? Yes   Zostavax completed Yes   Shingrix Completed?: Yes  Screening Tests Health Maintenance  Topic Date Due  . INFLUENZA VACCINE  10/15/2020  . TETANUS/TDAP  07/30/2022  . COVID-19 Vaccine  Completed  . PNA vac Low Risk Adult  Completed  . Zoster Vaccines- Shingrix  Completed  . HPV VACCINES  Aged Out    Health Maintenance  There are no preventive care reminders to display for this patient.  Colorectal cancer screening: No longer required.   Lung Cancer Screening: (Low Dose CT Chest recommended if Age 3-80 years, 30 pack-year currently smoking OR have quit w/in 15years.) does not qualify.   Lung Cancer Screening Referral: N/A  Additional Screening:  Hepatitis C Screening: does not qualify;   Vision Screening: Recommended annual ophthalmology exams for early detection of glaucoma and other disorders of the eye. Is the patient up to date with their annual eye exam?  Yes  Who is the provider or what is the name of the office in which the patient attends annual eye exams? Dr.Mccuen If pt is not established with a provider, would they like to be referred to a provider to establish care? No .   Dental Screening: Recommended annual dental exams for proper oral hygiene  Community Resource Referral / Chronic Care Management: CRR required this visit?  No    CCM required this visit?  No      Plan:     I have personally reviewed and noted the following in the patient's chart:   . Medical and social history . Use of alcohol, tobacco or illicit drugs  . Current medications and supplements including opioid prescriptions. Patient is not currently taking opioid prescriptions. . Functional ability and status . Nutritional status . Physical activity . Advanced directives . List of other physicians . Hospitalizations, surgeries, and ER visits in previous 12 months . Vitals . Screenings to include cognitive, depression, and falls . Referrals and appointments  In addition, I have reviewed and discussed with patient certain preventive protocols, quality metrics, and best practice recommendations. A written personalized care plan for preventive services as well as general preventive health recommendations were provided to patient.     Ofilia Neas, LPN   04/25/7679   Nurse Notes: None

## 2020-08-16 ENCOUNTER — Telehealth: Payer: Self-pay

## 2020-08-16 ENCOUNTER — Other Ambulatory Visit: Payer: Self-pay | Admitting: Family Medicine

## 2020-08-16 ENCOUNTER — Ambulatory Visit (INDEPENDENT_AMBULATORY_CARE_PROVIDER_SITE_OTHER): Payer: PPO

## 2020-08-16 ENCOUNTER — Other Ambulatory Visit: Payer: Self-pay

## 2020-08-16 VITALS — BP 104/60 | Temp 98.1°F | Ht 70.0 in | Wt 149.1 lb

## 2020-08-16 DIAGNOSIS — Z0000 Encounter for general adult medical examination without abnormal findings: Secondary | ICD-10-CM | POA: Diagnosis not present

## 2020-08-16 MED ORDER — ALPRAZOLAM 0.5 MG PO TABS: 0.5000 mg | ORAL_TABLET | Freq: Every evening | ORAL | 0 refills | Status: DC | PRN

## 2020-08-16 NOTE — Patient Instructions (Signed)
Eugene [name] , Thank you for taking time to come for your Medicare Wellness Visit. I appreciate your ongoing commitment to your health goals. Please review the following plan we discussed and let me know if I can assist you in the future.   Screening recommendations/referrals: Colonoscopy: No longer required Recommended yearly ophthalmology/optometry visit for glaucoma screening and checkup Recommended yearly dental visit for hygiene and checkup  Vaccinations: Influenza vaccine: Up to date, next due fall 2022 Pneumococcal vaccine: Completed series Tdap vaccine: Up to date, next due 07/29/2022 Shingles vaccine: Completed series    Advanced directives: please bring in a copy so that we may scan into your chart  Conditions/risks identified: none  Next appointment: None  Preventive Care 39 Years and Older, Male Preventive care refers to lifestyle choices and visits with your health care provider that can promote health and wellness. What does preventive care include?  A yearly physical exam. This is also called an annual well check.  Dental exams once or twice a year.  Routine eye exams. Ask your health care provider how often you should have your eyes checked.  Personal lifestyle choices, including:  Daily care of your teeth and gums.  Regular physical activity.  Eating a healthy diet.  Avoiding tobacco and drug use.  Limiting alcohol use.  Practicing safe sex.  Taking low doses of aspirin every day.  Taking vitamin and mineral supplements as recommended by your health care provider. What happens during an annual well check? The services and screenings done by your health care provider during your annual well check will depend on your age, overall health, lifestyle risk factors, and family history of disease. Counseling  Your health care provider may ask you questions about your:  Alcohol use.  Tobacco use.  Drug use.  Emotional well-being.  Home and relationship  well-being.  Sexual activity.  Eating habits.  History of falls.  Memory and ability to understand (cognition).  Work and work Statistician. Screening  You may have the following tests or measurements:  Height, weight, and BMI.  Blood pressure.  Lipid and cholesterol levels. These may be checked every 5 years, or more frequently if you are over 95 years old.  Skin check.  Lung cancer screening. You may have this screening every year starting at age 27 if you have a 30-pack-year history of smoking and currently smoke or have quit within the past 15 years.  Fecal occult blood test (FOBT) of the stool. You may have this test every year starting at age 40.  Flexible sigmoidoscopy or colonoscopy. You may have a sigmoidoscopy every 5 years or a colonoscopy every 10 years starting at age 57.  Prostate cancer screening. Recommendations will vary depending on your family history and other risks.  Hepatitis C blood test.  Hepatitis B blood test.  Sexually transmitted disease (STD) testing.  Diabetes screening. This is done by checking your blood sugar (glucose) after you have not eaten for a while (fasting). You may have this done every 1-3 years.  Abdominal aortic aneurysm (AAA) screening. You may need this if you are a current or former smoker.  Osteoporosis. You may be screened starting at age 74 if you are at high risk. Talk with your health care provider about your test results, treatment options, and if necessary, the need for more tests. Vaccines  Your health care provider may recommend certain vaccines, such as:  Influenza vaccine. This is recommended every year.  Tetanus, diphtheria, and acellular pertussis (Tdap, Td) vaccine.  You may need a Td booster every 10 years.  Zoster vaccine. You may need this after age 85.  Pneumococcal 13-valent conjugate (PCV13) vaccine. One dose is recommended after age 75.  Pneumococcal polysaccharide (PPSV23) vaccine. One dose is  recommended after age 44. Talk to your health care provider about which screenings and vaccines you need and how often you need them. This information is not intended to replace advice given to you by your health care provider. Make sure you discuss any questions you have with your health care provider. Document Released: 03/30/2015 Document Revised: 11/21/2015 Document Reviewed: 01/02/2015 Elsevier Interactive Patient Education  2017 [place] Prevention in the Home Falls can cause injuries. They can happen to people of all ages. There are many things you can do to make your home safe and to help prevent falls. What can I do on the outside of my home?  Regularly fix the edges of walkways and driveways and fix any cracks.  Remove anything that might make you trip as you walk through a door, such as a raised step or threshold.  Trim any bushes or trees on the path to your home.  Use bright outdoor lighting.  Clear any walking paths of anything that might make someone trip, such as rocks or tools.  Regularly check to see if handrails are loose or broken. Make sure that both sides of any steps have handrails.  Any raised decks and porches should have guardrails on the edges.  Have any leaves, snow, or ice cleared regularly.  Use sand or salt on walking paths during winter.  Clean up any spills in your garage right away. This includes oil or grease spills. What can I do in the bathroom?  Use night lights.  Install grab bars by the toilet and in the tub and shower. Do not use towel bars as grab bars.  Use non-skid mats or decals in the tub or shower.  If you need to sit down in the shower, use a plastic, non-slip stool.  Keep the floor dry. Clean up any water that spills on the floor as soon as it happens.  Remove soap buildup in the tub or shower regularly.  Attach bath mats securely with double-sided non-slip rug tape.  Do not have throw rugs and other things on  the floor that can make you trip. What can I do in the bedroom?  Use night lights.  Make sure that you have a light by your bed that is easy to reach.  Do not use any sheets or blankets that are too big for your bed. They should not hang down onto the floor.  Have a firm chair that has side arms. You can use this for support while you get dressed.  Do not have throw rugs and other things on the floor that can make you trip. What can I do in the kitchen?  Clean up any spills right away.  Avoid walking on wet floors.  Keep items that you use a lot in easy-to-reach places.  If you need to reach something above you, use a strong step stool that has a grab bar.  Keep electrical cords out of the way.  Do not use floor polish or wax that makes floors slippery. If you must use wax, use non-skid floor wax.  Do not have throw rugs and other things on the floor that can make you trip. What can I do with my stairs?  Do not leave any  items on the stairs.  Make sure that there are handrails on both sides of the stairs and use them. Fix handrails that are broken or loose. Make sure that handrails are as long as the stairways.  Check any carpeting to make sure that it is firmly attached to the stairs. Fix any carpet that is loose or worn.  Avoid having throw rugs at the top or bottom of the stairs. If you do have throw rugs, attach them to the floor with carpet tape.  Make sure that you have a light switch at the top of the stairs and the bottom of the stairs. If you do not have them, ask someone to add them for you. What else can I do to help prevent falls?  Wear shoes that:  Do not have high heels.  Have rubber bottoms.  Are comfortable and fit you well.  Are closed at the toe. Do not wear sandals.  If you use a stepladder:  Make sure that it is fully opened. Do not climb a closed stepladder.  Make sure that both sides of the stepladder are locked into place.  Ask someone to  hold it for you, if possible.  Clearly mark and make sure that you can see:  Any grab bars or handrails.  First and last steps.  Where the edge of each step is.  Use tools that help you move around (mobility aids) if they are needed. These include:  Canes.  Walkers.  Scooters.  Crutches.  Turn on the lights when you go into a dark area. Replace any light bulbs as soon as they burn out.  Set up your furniture so you have a clear path. Avoid moving your furniture around.  If any of your floors are uneven, fix them.  If there are any pets around you, be aware of where they are.  Review your medicines with your doctor. Some medicines can make you feel dizzy. This can increase your chance of falling. Ask your doctor what other things that you can do to help prevent falls. This information is not intended to replace advice given to you by your health care provider. Make sure you discuss any questions you have with your health care provider. Document Released: 12/28/2008 Document Revised: 08/09/2015 Document Reviewed: 04/07/2014 Elsevier Interactive Patient Education  2017 [employer].

## 2020-08-16 NOTE — Telephone Encounter (Signed)
Seen patient for a medicare wellness visit. Patient would like to know if we could send in a refill on Xanax. Please advise?

## 2020-09-02 ENCOUNTER — Other Ambulatory Visit: Payer: Self-pay | Admitting: Cardiovascular Disease

## 2020-09-03 NOTE — Progress Notes (Signed)
Chronic Care Management Pharmacy Note  09/13/2020 Name:  Eugene [name] MRN:  [id number] DOB:  Jun 08, 1931  Summary: PharmD follow up for general check in.  Medications updated and reviewed,  Recommendations/Changes made from today's visit: No changes/recs at this visit  Plan: FU In 6 months OTP   Subjective: Eugene [name] is an 87 y.o. year old male who is a primary patient of Pickard, Cammie Mcgee, MD.  The CCM team was consulted for assistance with disease management and care coordination needs.    Engaged with patient by telephone for follow up visit in response to provider referral for pharmacy case management and/or care coordination services.   Consent to Services:  The patient was given the following information about Chronic Care Management services today, agreed to services, and gave verbal consent: 1. CCM service includes personalized support from designated clinical staff supervised by the primary care provider, including individualized plan of care and coordination with other care providers 2. 24/7 contact phone numbers for assistance for urgent and routine care needs. 3. Service will only be billed when office clinical staff spend 20 minutes or more in a month to coordinate care. 4. Only one practitioner may furnish and bill the service in a calendar month. 5.The patient may stop CCM services at any time (effective at the end of the month) by phone call to the office staff. 6. The patient will be responsible for cost sharing (co-pay) of up to 20% of the service fee (after annual deductible is met). Patient agreed to services and consent obtained.  Patient Care Team: Susy Frizzle, MD as PCP - General (Family Medicine) Burnell Blanks, MD as PCP - Cardiology (Cardiology) Edythe Clarity, [facility] as Pharmacist (Pharmacist) Warren Danes, PA-C as Physician Assistant (Dermatology)  Recent office visits: None since last CCM call  Recent consult visits: 07/11/20  Sharol Given, Ortho) - R knee pain when weight bearing.  Hospital visits: None in previous 6 months   Objective:  Lab Results  Component Value Date   CREATININE 0.84 01/03/2020   BUN 11 01/03/2020   GFRNONAA 79 01/03/2020   GFRAA 92 01/03/2020   NA 139 01/03/2020   K 3.9 01/03/2020   CALCIUM 8.5 (L) 01/03/2020   CO2 28 01/03/2020   GLUCOSE 83 01/03/2020    No results found for: HGBA1C, FRUCTOSAMINE, GFR, MICROALBUR  Last diabetic Eye exam: No results found for: HMDIABEYEEXA  Last diabetic Foot exam: No results found for: HMDIABFOOTEX   Lab Results  Component Value Date   CHOL 130 01/03/2020   HDL 63 01/03/2020   LDLCALC 52 01/03/2020   TRIG 69 01/03/2020   CHOLHDL 2.1 01/03/2020    Hepatic Function Latest Ref Rng & Units 01/03/2020 10/06/2019 09/29/2019  Total Protein 6.1 - 8.1 g/dL 5.4(L) 5.9(L) 6.0(L)  Albumin 3.5 - 4.7 g/dL - - -  AST 10 - 35 U/L 25 22 38(H)  ALT 9 - 46 U/L 30 25 44  Alk Phosphatase 39 - 117 IU/L - - -  Total Bilirubin 0.2 - 1.2 mg/dL 0.8 0.9 1.0  Bilirubin, Direct 0.00 - 0.40 mg/dL - - -    Lab Results  Component Value Date/Time   TSH 1.60 10/23/2015 08:23 AM   TSH 1.127 09/13/2014 08:46 AM    CBC Latest Ref Rng & Units 09/22/2019 10/25/2018 10/10/2017  WBC 3.8 - 10.8 Thousand/uL 7.0 7.0 9.6  Hemoglobin 13.2 - 17.1 g/dL 13.5 13.4 13.4  Hematocrit 38.5 - 50.0 % 40.6 40.8 38.8(L)  Platelets 140 - 400 Thousand/uL 240 277 192    No results found for: VD25OH  Clinical ASCVD: Yes  The ASCVD Risk score Mikey Bussing DC Jr., et al., 2013) failed to calculate for the following reasons:   The 2013 ASCVD risk score is only valid for ages 5 to 20   The patient has a prior MI or stroke diagnosis    Depression screen [facility] 2/9 08/16/2020 09/29/2019 11/08/2018  Decreased Interest 0 0 0  Down, Depressed, Hopeless 0 0 0  PHQ - 2 Score 0 0 0  Altered sleeping - - -  Tired, decreased energy - - -  Change in appetite - - -  Feeling bad or failure about yourself  - - -   Trouble concentrating - - -  Moving slowly or fidgety/restless - - -  Suicidal thoughts - - -  PHQ-9 Score - - -  Difficult doing work/chores - - -     Social History   Tobacco Use  Smoking Status Former   Packs/day: 1.00   Years: 8.00   Pack years: 8.00   Types: Cigarettes  Smokeless Tobacco Never   BP Readings from Last 3 Encounters:  08/16/20 104/60  09/29/19 128/70  07/21/19 108/60   Pulse Readings from Last 3 Encounters:  09/29/19 72  07/21/19 65  05/30/19 64   Wt Readings from Last 3 Encounters:  08/16/20 149 lb 2 oz (67.6 kg)  09/29/19 147 lb (66.7 kg)  07/21/19 162 lb (73.5 kg)   BMI Readings from Last 3 Encounters:  08/16/20 21.40 kg/m  09/29/19 21.09 kg/m  07/21/19 23.24 kg/m    Assessment/Interventions: Review of patient past medical history, allergies, medications, health status, including review of consultants reports, laboratory and other test data, was performed as part of comprehensive evaluation and provision of chronic care management services.   SDOH:  (Social Determinants of Health) assessments and interventions performed: Yes  Financial Resource Strain: Low Risk    Difficulty of Paying Living Expenses: Not hard at all    SDOH Screenings   Alcohol Screen: Low Risk    Last Alcohol Screening Score (AUDIT): 4  Depression (PHQ2-9): Low Risk    PHQ-2 Score: 0  Financial Resource Strain: Low Risk    Difficulty of Paying Living Expenses: Not hard at all  Food Insecurity: No Food Insecurity   Worried About Charity fundraiser in the Last Year: Never true   Ran Out of Food in the Last Year: Never true  Housing: Low Risk    Last Housing Risk Score: 0  Physical Activity: Inactive   Days of Exercise per Week: 0 days   Minutes of Exercise per Session: 0 min  Social Connections: Moderately Integrated   Frequency of Communication with Friends and Family: More than three times a week   Frequency of Social Gatherings with Friends and Family:  More than three times a week   Attends Religious Services: More than 4 times per year   Active Member of [employer] or Organizations: No   Attends Archivist Meetings: Never   Marital Status: Married  Stress: No Stress Concern Present   Feeling of Stress : Not at all  Tobacco Use: Medium Risk   Smoking Tobacco Use: Former   Smokeless Tobacco Use: Never  Transportation Needs: No Data processing manager (Medical): No   Lack of Transportation (Non-Medical): No    CCM Care Plan  Allergies  Allergen Reactions   Aleve [Naproxen Sodium] Nausea  And Vomiting    No problem with other NSAIDs   Amoxicillin Other (See Comments)    Did it involve swelling of the face/tongue/throat, SOB, or low BP? No Did it involve sudden or severe rash/hives, skin peeling, or any reaction on the inside of your mouth or nose? No Did you need to seek medical attention at a hospital or doctor's office? unknown When did it last happen?   yrs ago    dizziness If all above answers are "NO", may proceed with cephalosporin use.    Morphine And Related     dizzy   Penicillins     Diarrhea Did it involve swelling of the face/tongue/throat, SOB, or low BP? No Did it involve sudden or severe rash/hives, skin peeling, or any reaction on the inside of your mouth or nose? No Did you need to seek medical attention at a hospital or doctor's office? Unknown When did it last happen?    years ago   If all above answers are "NO", may proceed with cephalosporin use.     Medications Reviewed Today     Reviewed by Edythe Clarity, [facility] (Pharmacist) on 09/13/20 at 1211  Med List Status: <None>   Medication Order Taking? Sig Documenting Provider Last Dose Status Informant  ALPRAZolam (XANAX) 0.5 MG tablet [id number] Yes Take 1 tablet (0.5 mg total) by mouth at bedtime as needed. for sleep Susy Frizzle, MD Taking Active   aspirin 81 MG tablet [id number] Yes Take 81 mg by mouth at bedtime.  [provider] Taking Active Multiple Informants           Med Note Landry Mellow, Roxana Hires Feb 04, 2018 10:19 AM)    budesonide (ENTOCORT EC) 3 MG 24 hr capsule [id number] Yes Take 9 mg by mouth in the morning. Carol Ada, MD Taking Active   carvedilol (COREG) 3.125 MG tablet [id number] Yes TAKE 1 TABLET BY MOUTH TWICE A DAY WITH MEALS Burnell Blanks, MD Taking Active   cholestyramine Lucrezia Starch) 4 GM/DOSE powder [id number] Yes USE 1 SCOOP 3 TIMES A DAY WITH A MEAL Pickard, Cammie Mcgee, MD Taking Active   fluorouracil (EFUDEX) 5 % cream [id number] Yes SMARTSIG:Sparingly Topical Twice Daily [provider] Taking Active   losartan (COZAAR) 25 MG tablet [id number] Yes Take 0.5 tablets (12.5 mg total) by mouth daily. NEEDS TO KEEP APPOINTMENT FOR FURTHER REFILLS Burnell Blanks, MD Taking Active   Multiple Vitamins-Minerals (PRESERVISION AREDS PO) [id number] Yes Take 1 tablet by mouth 2 (two) times daily. [provider] Taking Active Multiple Informants  nitroGLYCERIN (NITROSTAT) 0.4 MG SL tablet [id number] Yes Place 1 tablet (0.4 mg total) under the tongue every 5 (five) minutes x 3 doses as needed for chest pain. Susy Frizzle, MD Taking Active   Omega-3 Fatty Acids (FISH OIL PO) [id number] Yes Take 1 capsule by mouth at bedtime. [provider] Taking Active Multiple Informants  rosuvastatin (CRESTOR) 20 MG tablet [id number] Yes TAKE 1 TABLET BY MOUTH EVERY DAY Susy Frizzle, MD Taking Active             Patient Active Problem List   Diagnosis Date Noted   CAD (coronary artery disease) 10/10/2017   Ischemic cardiomyopathy    Chronic combined systolic and diastolic heart failure (HCC)    Hyperlipidemia LDL goal <70    Acute ST elevation myocardial infarction (STEMI) involving left anterior descending (LAD) coronary artery ([place]) 10/07/2017   Collagenous colitis    History of  right below knee amputation ([place]) 10/13/2013   Sinus tachycardia  06/04/2012   Anxiety state, unspecified 06/04/2012    Immunization History  Administered Date(s) Administered   Fluad Quad(high Dose 65+) 11/08/2018   Influenza, High Dose Seasonal PF 01/08/2017, 12/15/2017   Influenza,inj,Quad PF,6+ Mos 12/28/2012, 01/17/2014   Influenza-Unspecified 01/15/2016, 12/15/2017   PFIZER(Purple Top)SARS-COV-2 Vaccination 04/08/2019, 04/28/2019, 12/27/2019, 01/23/2020   Pneumococcal Conjugate-13 09/21/2014   Pneumococcal Polysaccharide-23 05/09/2002   Tdap 07/29/2012   Zoster Recombinat (Shingrix) 05/27/2017, 07/30/2017   Zoster, Live 07/07/2006    Conditions to be addressed/monitored:  Hyperlipidemia, Heart Failure, and Anxiety  Care Plan : General Pharmacy (Adult)  Updates made by Edythe Clarity, RPH since 09/13/2020 12:00 AM     Problem: Hyperlipidemia, Heart Failure, and Anxiety   Priority: High  Onset Date: 09/13/2020     Long-Range Goal: Patient-Specific Goal   Start Date: 09/13/2020  Expected End Date: 03/15/2021  This Visit's Progress: On track  Priority: High  Note:   Current Barriers:  None identified at this time  Pharmacist Clinical Goal(s):  Patient will achieve adherence to monitoring guidelines and medication adherence to achieve therapeutic efficacy maintain control of cholesterol and BP as evidenced by labs  adhere to prescribed medication regimen as evidenced by pill box usage contact provider office for questions/concerns as evidenced notation of same in electronic health record through collaboration with PharmD and provider.   Interventions: 1:1 collaboration with Susy Frizzle, MD regarding development and update of comprehensive plan of care as evidenced by provider attestation and co-signature Inter-disciplinary care team collaboration (see longitudinal plan of care) Comprehensive medication review performed; medication list updated in electronic medical record  Hyperlipidemia: (LDL goal <  100) -Controlled -Current treatment: Rosuvastatin 62m daily Cholestyramine 4gm/dose tid -Medications previously tried: none noted   -Educated on Cholesterol goals;  Benefits of statin for ASCVD risk reduction; Importance of limiting foods high in cholesterol; -Most recent LDL well controlled -Counseled on diet and exercise extensively Recommended to continue current medication  Heart Failure (Goal: manage symptoms and prevent exacerbations) -Controlled -Last ejection fraction: 55%  -HF type: Combined Systolic and Diastolic -NYHA Class: II (slight limitation of activity)  -Current treatment: Carvedilol 3.1230mtwice daily -Medications previously tried: none noted   -Educated on Benefits of medications for managing symptoms and prolonging life Importance of weighing daily; if you gain more than 3 pounds in one day or 5 pounds in one week, contact providers Importance of blood pressure control -Does report some L leg swelling occasionally, however it resolves overnight when he lays down. -Counseled on diet and exercise extensively Recommended to continue current medication  Anxiety (Goal: Control symptoms) -Controlled -Current treatment: Alprazolam 0.23m47mrn for sleep -Medications previously tried/failed: none  -GAD7: No flowsheet data found. -Educated on Benefits of medication for symptom control -Still takes prn -Recommended to continue current medication  Patient Goals/Self-Care Activities Patient will:  - take medications as prescribed focus on medication adherence by pill box check blood pressure if symptomatic, document, and provide at future appointments  Follow Up Plan: The care management team will reach out to the patient again over the next 180 days.         Medication Assistance: None required.  Patient affirms current coverage meets needs.  Compliance/Adherence/Medication fill history: Care Gaps: Patient needs AWV  [facility]erred pharmacy  is:  [id number] [place] [id number]: [phone]: [phone]  Uses pill box? Yes Pt endorses 100% compliance  We discussed: Benefits of medication synchronization, packaging and delivery as well as enhanced pharmacist oversight with [employer]. Patient decided to: Continue current medication management strategy  Care Plan and Follow Up Patient Decision:  Patient agrees to Care Plan and Follow-up.  Plan: The care management team will reach out to the patient again over the next 180 days.  Beverly Milch, PharmD Clinical Pharmacist [place] [phone]

## 2020-09-13 ENCOUNTER — Ambulatory Visit (INDEPENDENT_AMBULATORY_CARE_PROVIDER_SITE_OTHER): Payer: PPO | Admitting: Pharmacist

## 2020-09-13 DIAGNOSIS — E785 Hyperlipidemia, unspecified: Secondary | ICD-10-CM | POA: Diagnosis not present

## 2020-09-13 DIAGNOSIS — I5042 Chronic combined systolic (congestive) and diastolic (congestive) heart failure: Secondary | ICD-10-CM

## 2020-09-13 DIAGNOSIS — I2510 Atherosclerotic heart disease of native coronary artery without angina pectoris: Secondary | ICD-10-CM

## 2020-09-13 NOTE — Patient Instructions (Signed)
Visit Information   Goals Addressed             This Visit's Progress    Track and Manage Fluids and Swelling-Heart Failure       Timeframe:  Long-Range Goal Priority:  High Start Date:    09/13/20                         Expected End Date:  03/15/21                      Follow Up Date 12/14/20    - call office if I gain more than 2 pounds in one day or 5 pounds in one week - keep legs up while sitting - watch for swelling in feet, ankles and legs every day - weigh myself daily    Why is this important?   It is important to check your weight daily and watch how much salt and liquids you have.  It will help you to manage your heart failure.    Notes:         Patient Care Plan: General Pharmacy (Adult)     Problem Identified: Hyperlipidemia, Heart Failure, and Anxiety   Priority: High  Onset Date: 09/13/2020     Long-Range Goal: Patient-Specific Goal   Start Date: 09/13/2020  Expected End Date: 03/15/2021  This Visit's Progress: On track  Priority: High  Note:   Current Barriers:  None identified at this time  Pharmacist Clinical Goal(s):  Patient will achieve adherence to monitoring guidelines and medication adherence to achieve therapeutic efficacy maintain control of cholesterol and BP as evidenced by labs  adhere to prescribed medication regimen as evidenced by pill box usage contact provider office for questions/concerns as evidenced notation of same in electronic health record through collaboration with PharmD and provider.   Interventions: 1:1 collaboration with Susy Frizzle, MD regarding development and update of comprehensive plan of care as evidenced by provider attestation and co-signature Inter-disciplinary care team collaboration (see longitudinal plan of care) Comprehensive medication review performed; medication list updated in electronic medical record  Hyperlipidemia: (LDL goal < 100) -Controlled -Current treatment: Rosuvastatin 20mg   daily Cholestyramine 4gm/dose tid -Medications previously tried: none noted   -Educated on Cholesterol goals;  Benefits of statin for ASCVD risk reduction; Importance of limiting foods high in cholesterol; -Most recent LDL well controlled -Counseled on diet and exercise extensively Recommended to continue current medication  Heart Failure (Goal: manage symptoms and prevent exacerbations) -Controlled -Last ejection fraction: 55%  -HF type: Combined Systolic and Diastolic -NYHA Class: II (slight limitation of activity)  -Current treatment: Carvedilol 3.125mg  twice daily -Medications previously tried: none noted   -Educated on Benefits of medications for managing symptoms and prolonging life Importance of weighing daily; if you gain more than 3 pounds in one day or 5 pounds in one week, contact providers Importance of blood pressure control -Does report some L leg swelling occasionally, however it resolves overnight when he lays down. -Counseled on diet and exercise extensively Recommended to continue current medication  Anxiety (Goal: Control symptoms) -Controlled -Current treatment: Alprazolam 0.5mg  prn for sleep -Medications previously tried/failed: none  -GAD7: No flowsheet data found. -Educated on Benefits of medication for symptom control -Still takes prn -Recommended to continue current medication  Patient Goals/Self-Care Activities Patient will:  - take medications as prescribed focus on medication adherence by pill box check blood pressure if symptomatic, document, and provide at future appointments  Follow Up Plan: The care management team will reach out to the patient again over the next 180 days.        The patient verbalized understanding of instructions, educational materials, and care plan provided today and agreed to receive a mailed copy of patient instructions, educational materials, and care plan.  Telephone follow up appointment with pharmacy team  member scheduled for: 6 months  Edythe Clarity, [place]

## 2020-09-19 ENCOUNTER — Other Ambulatory Visit: Payer: Self-pay

## 2020-09-19 ENCOUNTER — Telehealth (INDEPENDENT_AMBULATORY_CARE_PROVIDER_SITE_OTHER): Payer: PPO | Admitting: Nurse Practitioner

## 2020-09-19 DIAGNOSIS — J069 Acute upper respiratory infection, unspecified: Secondary | ICD-10-CM | POA: Diagnosis not present

## 2020-09-19 DIAGNOSIS — R059 Cough, unspecified: Secondary | ICD-10-CM | POA: Diagnosis not present

## 2020-09-19 MED ORDER — BENZONATATE 100 MG PO CAPS: 100.0000 mg | ORAL_CAPSULE | Freq: Two times a day (BID) | ORAL | 0 refills | Status: DC | PRN

## 2020-09-19 NOTE — Progress Notes (Signed)
Subjective:    Patient ID: Eugene [name], male    DOB: Oct 12, 1931, 87 y.o.   MRN: [id number]  HPI: [name] is a 87 y.o. male presenting virtually for cough.  Chief Complaint  Patient presents with   Cough    Cough began last night, has to sit up to cough or stand to get it out. Cough is not productive. Taking tussin for the cough, no relief yet. No other sx to report. Wants to be test for covid. Has been vaccinated and boostered   UPPER RESPIRATORY TRACT INFECTION Onset: last night COVID-19 testing history: not tested this occurrence COVID-19 vaccination status:  fully vaccinated with 2 vaccines and booster Fever: no Myalgias: no Chills: no Cough: yes; dry Shortness of breath:  yes with coughing only Wheezing: no Chest pain: yes, with cough Chest tightness: no Chest congestion: yes Nasal congestion: no Runny nose: no Post nasal drip: no Sneezing: no Sore throat: no Swollen glands: no Sinus pressure: no Headache: no Face pain: no Toothache: no Ear pain: no  Ear pressure: no  Eyes red/itching:no Eye drainage/crusting: no  Nausea: no  Vomiting: no Diarrhea: no  Change in appetite: no  Loss of taste/smell: no  Rash: no Fatigue: yes Sick contacts: yes; daughter had cough 1 week Strep contacts: no  Context: stable Recurrent sinusitis: no Treatments attempted: Robitussin Relief with OTC medications: no  Allergies  Allergen Reactions   Aleve [Naproxen Sodium] Nausea And Vomiting    No problem with other NSAIDs   Amoxicillin Other (See Comments)    Did it involve swelling of the face/tongue/throat, SOB, or low BP? No Did it involve sudden or severe rash/hives, skin peeling, or any reaction on the inside of your mouth or nose? No Did you need to seek medical attention at a hospital or doctor's office? unknown When did it last happen?   yrs ago    dizziness If all above answers are "NO", may proceed with cephalosporin use.    Morphine And Related      dizzy   Penicillins     Diarrhea Did it involve swelling of the face/tongue/throat, SOB, or low BP? No Did it involve sudden or severe rash/hives, skin peeling, or any reaction on the inside of your mouth or nose? No Did you need to seek medical attention at a hospital or doctor's office? Unknown When did it last happen?    years ago   If all above answers are "NO", may proceed with cephalosporin use.     Outpatient Encounter Medications as of 09/19/2020  Medication Sig   ALPRAZolam (XANAX) 0.5 MG tablet Take 1 tablet (0.5 mg total) by mouth at bedtime as needed. for sleep   aspirin 81 MG tablet Take 81 mg by mouth at bedtime.   benzonatate (TESSALON) 100 MG capsule Take 1 capsule (100 mg total) by mouth 2 (two) times daily as needed for cough. Take first dose at night time and monitor for drowsiness.  If this medication makes you drowsy, do not take while driving or operating heavy machinery.   budesonide (ENTOCORT EC) 3 MG 24 hr capsule Take 9 mg by mouth in the morning.   carvedilol (COREG) 3.125 MG tablet TAKE 1 TABLET BY MOUTH TWICE A DAY WITH MEALS   cholestyramine (QUESTRAN) 4 GM/DOSE powder USE 1 SCOOP 3 TIMES A DAY WITH A MEAL   fluorouracil (EFUDEX) 5 % cream SMARTSIG:Sparingly Topical Twice Daily   losartan (COZAAR) 25 MG tablet Take 0.5 tablets (  12.5 mg total) by mouth daily. NEEDS TO KEEP APPOINTMENT FOR FURTHER REFILLS   Multiple Vitamins-Minerals (PRESERVISION AREDS PO) Take 1 tablet by mouth 2 (two) times daily.   nitroGLYCERIN (NITROSTAT) 0.4 MG SL tablet Place 1 tablet (0.4 mg total) under the tongue every 5 (five) minutes x 3 doses as needed for chest pain.   Omega-3 Fatty Acids (FISH OIL PO) Take 1 capsule by mouth at bedtime.   rosuvastatin (CRESTOR) 20 MG tablet TAKE 1 TABLET BY MOUTH EVERY DAY   No facility-administered encounter medications on file as of 09/19/2020.    Patient Active Problem List   Diagnosis Date Noted   CAD (coronary artery disease) 10/10/2017    Ischemic cardiomyopathy    Chronic combined systolic and diastolic heart failure (HCC)    Hyperlipidemia LDL goal <70    Acute ST elevation myocardial infarction (STEMI) involving left anterior descending (LAD) coronary artery ([place]) 10/07/2017   Collagenous colitis    History of right below knee amputation ([place]) 10/13/2013   Sinus tachycardia 06/04/2012   Anxiety state, unspecified 06/04/2012    Past Medical History:  Diagnosis Date   Amputated right leg (HCC)    CAD (coronary artery disease)    a. 09/2017 - acute MI -  emergent cath showing 99% stenosis in midLAD with thrombotic stenosis at the bifurcation of the second diagonal, which was relatively small in diameter. He underwent placement of DES to midLAD which jailed the second diagonal.    Chronic combined systolic and diastolic heart failure ([place])    Collagenous colitis    Dr. Festus Holts   History of ST elevation myocardial infarction (STEMI)    09/2017: LAD   History of stomach ulcers    Hyperlipidemia LDL goal <70    Ischemic cardiomyopathy    Squamous cell carcinoma of skin 03/27/2014   in situ-left temple (txpbx)   Squamous cell carcinoma of skin 02/24/2018   in situ-left cheek (CX35FU)    Relevant past medical, surgical, family and social history reviewed and updated as indicated. Interim medical history since our last visit reviewed.  Review of Systems Per HPI unless specifically indicated above     Objective:    There were no vitals taken for this visit.  Wt Readings from Last 3 Encounters:  08/16/20 149 lb 2 oz (67.6 kg)  09/29/19 147 lb (66.7 kg)  07/21/19 162 lb (73.5 kg)    Physical Exam Physical examination unable to be performed due to lack of equipment.  Patient talking in complete sentences during telemedicine visit.  Results for orders placed or performed in visit on 02/22/20  ECHOCARDIOGRAM COMPLETE  Result Value Ref Range   Area-P 1/2 2.37 cm2   S' Lateral 2.30 cm   AV Area mean vel 1.64 cm2    AR max vel 1.41 cm2   AV Area VTI 1.46 cm2   Ao pk vel 2.24 m/s   AV Mean grad 12.0 mmHg   AV Peak grad 20.1 mmHg      Assessment & Plan:  1. Upper respiratory tract infection, unspecified type Acute.  Encouraged COVID testing.  Reassured patient that symptoms are most consistent with a viral upper respiratory infection and explained lack of efficacy of antibiotics against viruses.  Discussed expected course and features suggestive of secondary bacterial infection.  Continue supportive care. Increase fluid intake with water or electrolyte solution like pedialyte. Encouraged acetaminophen as needed for fever/pain. Encouraged salt water gargling, chloraseptic spray and throat lozenges. Encouraged OTC guaifenesin. Encouraged saline sinus  flushes and/or neti with humidified air. Start Tessalon to suppress cough, do not take if cough becomes productive.  - SARS-CoV-2 RNA (COVID-19) and Respiratory Viral Panel, Qualitative NAAT; Future - benzonatate (TESSALON) 100 MG capsule; Take 1 capsule (100 mg total) by mouth 2 (two) times daily as needed for cough. Take first dose at night time and monitor for drowsiness.  If this medication makes you drowsy, do not take while driving or operating heavy machinery.  Dispense: 20 capsule; Refill: 0  2. Cough Acute.  Likely related to acute URI.  Start Tessalon for dry cough.  If symptoms persist >1 week, return to clinic.    - SARS-CoV-2 RNA (COVID-19) and Respiratory Viral Panel, Qualitative NAAT; Future - benzonatate (TESSALON) 100 MG capsule; Take 1 capsule (100 mg total) by mouth 2 (two) times daily as needed for cough. Take first dose at night time and monitor for drowsiness.  If this medication makes you drowsy, do not take while driving or operating heavy machinery.  Dispense: 20 capsule; Refill: 0    Follow up plan: Return if symptoms worsen or fail to improve.   This visit was completed via telephone due to the restrictions of the COVID-19  pandemic. All issues as above were discussed and addressed but no physical exam was performed. If it was felt that the patient should be evaluated in the office, they were directed there. The patient verbally consented to this visit. Patient was unable to complete an audio/visual visit due to Lack of internet. Location of the patient: home Location of the provider: work Those involved with this call:  Provider: Noemi Chapel, DNP, FNP-C CMA: Annabelle Harman, CMA Front Desk/Registration: Santina Evans  Time spent on call:  7 minutes on the phone discussing health concerns. 15 minutes total spent in review of patient's record and preparation of their chart. I verified patient identity using two factors (patient name and date of birth). Patient consents verbally to being seen via telemedicine visit today.

## 2020-09-19 NOTE — Addendum Note (Signed)
Addended by: Amalia Hailey on: 09/19/2020 10:09 AM   Modules accepted: Orders

## 2020-09-20 ENCOUNTER — Telehealth: Payer: Self-pay

## 2020-09-20 ENCOUNTER — Ambulatory Visit (HOSPITAL_COMMUNITY)
Admission: RE | Admit: 2020-09-20 | Discharge: 2020-09-20 | Disposition: A | Discharge status: Home / Self Care | Payer: PPO | Source: Ambulatory Visit | Attending: Nurse Practitioner | Admitting: Nurse Practitioner

## 2020-09-20 DIAGNOSIS — I517 Cardiomegaly: Secondary | ICD-10-CM | POA: Diagnosis not present

## 2020-09-20 DIAGNOSIS — R079 Chest pain, unspecified: Secondary | ICD-10-CM | POA: Diagnosis not present

## 2020-09-20 DIAGNOSIS — J069 Acute upper respiratory infection, unspecified: Secondary | ICD-10-CM

## 2020-09-20 NOTE — Telephone Encounter (Signed)
Pt aware, going to [facility] for imaging

## 2020-09-21 LAB — SARS-COV-2 RNA (COVID-19) RESP VIRAL PNL QL NAAT
Adenovirus B: NOT DETECTED
HUMAN PARAINFLU VIRUS 1: NOT DETECTED
HUMAN PARAINFLU VIRUS 2: NOT DETECTED
HUMAN PARAINFLU VIRUS 3: NOT DETECTED
INFLUENZA A SUBTYPE H1: NOT DETECTED
INFLUENZA A SUBTYPE H3: NOT DETECTED
Influenza A: NOT DETECTED
Influenza B: NOT DETECTED
Metapneumovirus: NOT DETECTED
Respiratory Syncytial Virus A: DETECTED — AB
Respiratory Syncytial Virus B: NOT DETECTED
Rhinovirus: NOT DETECTED
SARS CoV2 RNA: NOT DETECTED

## 2020-09-27 ENCOUNTER — Other Ambulatory Visit: Payer: Self-pay | Admitting: Cardiovascular Disease

## 2020-09-27 ENCOUNTER — Other Ambulatory Visit: Payer: Self-pay | Admitting: Family Medicine

## 2020-10-08 ENCOUNTER — Ambulatory Visit (INDEPENDENT_AMBULATORY_CARE_PROVIDER_SITE_OTHER): Payer: PPO | Admitting: Family Medicine

## 2020-10-08 ENCOUNTER — Other Ambulatory Visit: Payer: Self-pay

## 2020-10-08 ENCOUNTER — Other Ambulatory Visit: Payer: PPO

## 2020-10-08 ENCOUNTER — Encounter: Payer: Self-pay | Admitting: Family Medicine

## 2020-10-08 VITALS — BP 110/64 | HR 78 | Temp 98.1°F | Resp 16 | Ht 70.0 in | Wt 145.0 lb

## 2020-10-08 DIAGNOSIS — Z0000 Encounter for general adult medical examination without abnormal findings: Secondary | ICD-10-CM

## 2020-10-08 DIAGNOSIS — I5042 Chronic combined systolic (congestive) and diastolic (congestive) heart failure: Secondary | ICD-10-CM | POA: Diagnosis not present

## 2020-10-08 DIAGNOSIS — Z0001 Encounter for general adult medical examination with abnormal findings: Secondary | ICD-10-CM | POA: Diagnosis not present

## 2020-10-08 DIAGNOSIS — K52839 Microscopic colitis, unspecified: Secondary | ICD-10-CM

## 2020-10-08 DIAGNOSIS — E785 Hyperlipidemia, unspecified: Secondary | ICD-10-CM

## 2020-10-08 DIAGNOSIS — I2510 Atherosclerotic heart disease of native coronary artery without angina pectoris: Secondary | ICD-10-CM | POA: Diagnosis not present

## 2020-10-08 NOTE — Addendum Note (Signed)
Addended by: Sheral Flow on: 10/08/2020 11:32 AM   Modules accepted: Orders

## 2020-10-08 NOTE — Progress Notes (Signed)
Subjective:    Patient ID: Eugene [name], male    DOB: 1931/12/10, 87 y.o.   MRN: [id number]    Patient is scheduled today for complete physical exam.  Patient is a very pleasant 87 year old Caucasian male.  He denies any medical concerns today.    IMPRESSIONS     1. Left ventricular ejection fraction, by estimation, is 55 to 60%. The  left ventricle has normal function. The left ventricle has no regional  wall motion abnormalities. There is mild left ventricular hypertrophy.  Left ventricular diastolic parameters  are consistent with Grade I diastolic dysfunction (impaired relaxation).   2. Right ventricular systolic function is normal. The right ventricular  size is normal. There is normal pulmonary artery systolic pressure. The  estimated right ventricular systolic pressure is [id number] mmHg.   3. Left atrial size was mildly dilated.   4. The mitral valve is degenerative. Trivial mitral valve regurgitation.  No evidence of mitral stenosis.   5. The aortic valve is abnormal. There is moderate calcification of the  aortic valve. Aortic valve regurgitation is trivial. Mild aortic valve  stenosis. Aortic valve mean gradient measures 12.0 mmHg.   6. The inferior vena cava is normal in size with greater than 50%  respiratory variability, suggesting right atrial pressure of 3 mmHg.   Patient does have some age-related cognitive decline.  I performed a Mini-Mental status exam however today on the patient and he did well scoring 30 out of 30.  He had a little bit of trouble with serial sevens.  His first attempt was an air.  However after he corrected himself he was then able to slowly perform 7 serial sevens.  The remainder of his Mini-Mental status exam was normal.  He denies losing money.  He denies getting lost.  He denies forgetting conversations.  However he does have occasional short-term memory issues.  He denies any falls.  He is still working in his garden and farming despite the  heat. Immunization History  Administered Date(s) Administered   Fluad Quad(high Dose 65+) 11/08/2018   Influenza, High Dose Seasonal PF 01/08/2017, 12/15/2017   Influenza,inj,Quad PF,6+ Mos 12/28/2012, 01/17/2014   Influenza-Unspecified 01/15/2016, 12/15/2017   PFIZER(Purple Top)SARS-COV-2 Vaccination 04/08/2019, 04/28/2019, 12/27/2019, 01/23/2020   Pneumococcal Conjugate-13 09/21/2014   Pneumococcal Polysaccharide-23 05/09/2002   Tdap 07/29/2012   Zoster Recombinat (Shingrix) 05/27/2017, 07/30/2017   Zoster, Live 07/07/2006   Past Medical History:  Diagnosis Date   Amputated right leg ([place])    CAD (coronary artery disease)    a. 09/2017 - acute MI -  emergent cath showing 99% stenosis in midLAD with thrombotic stenosis at the bifurcation of the second diagonal, which was relatively small in diameter. He underwent placement of DES to midLAD which jailed the second diagonal.    Chronic combined systolic and diastolic heart failure ([place])    Collagenous colitis    Dr. Festus Holts   History of ST elevation myocardial infarction (STEMI)    09/2017: LAD   History of stomach ulcers    Hyperlipidemia LDL goal <70    Ischemic cardiomyopathy    Squamous cell carcinoma of skin 03/27/2014   in situ-left temple (txpbx)   Squamous cell carcinoma of skin 02/24/2018   in situ-left cheek (CX35FU)   Past Surgical History:  Procedure Laterality Date   BACK SURGERY     CARDIAC CATHETERIZATION     CORONARY STENT INTERVENTION N/A 10/07/2017   Procedure: CORONARY STENT INTERVENTION;  Surgeon: Wellington Hampshire, MD;  Location: [facility];  Service: Cardiovascular;  Laterality: N/A;   CORONARY/GRAFT ACUTE MI REVASCULARIZATION N/A 10/07/2017   Procedure: Coronary/Graft Acute MI Revascularization;  Surgeon: Wellington Hampshire, MD;  Location: [facility];  Service: Cardiovascular;  Laterality: N/A;   LEFT HEART CATH AND CORONARY ANGIOGRAPHY N/A 10/07/2017   Procedure: LEFT HEART CATH AND  CORONARY ANGIOGRAPHY;  Surgeon: Wellington Hampshire, MD;  Location: [facility];  Service: Cardiovascular;  Laterality: N/A;   LEG AMPUTATION     Right Below knee   STOMACH SURGERY     Billroth II gastrojejunostomy   TOTAL KNEE ARTHROPLASTY     Current Outpatient Medications on File Prior to Visit  Medication Sig Dispense Refill   ALPRAZolam (XANAX) 0.5 MG tablet Take 1 tablet (0.5 mg total) by mouth at bedtime as needed. for sleep 30 tablet 0   aspirin 81 MG tablet Take 81 mg by mouth at bedtime.     carvedilol (COREG) 3.125 MG tablet TAKE 1 TABLET BY MOUTH TWICE A DAY WITH MEALS 180 tablet 1   fluorouracil (EFUDEX) 5 % cream SMARTSIG:Sparingly Topical Twice Daily     losartan (COZAAR) 25 MG tablet Take 0.5 tablets (12.5 mg total) by mouth daily. NEEDS TO KEEP APPOINTMENT FOR FURTHER REFILLS 45 tablet 0   Multiple Vitamins-Minerals (PRESERVISION AREDS PO) Take 1 tablet by mouth 2 (two) times daily.     nitroGLYCERIN (NITROSTAT) 0.4 MG SL tablet Place 1 tablet (0.4 mg total) under the tongue every 5 (five) minutes x 3 doses as needed for chest pain. 25 tablet 3   Omega-3 Fatty Acids (FISH OIL PO) Take 1 capsule by mouth at bedtime.     rosuvastatin (CRESTOR) 20 MG tablet TAKE 1 TABLET BY MOUTH EVERY DAY 90 tablet 0   cholestyramine (QUESTRAN) 4 GM/DOSE powder USE 1 SCOOP 3 TIMES A DAY WITH A MEAL (Patient not taking: Reported on 10/08/2020) 756 g 6   No current facility-administered medications on file prior to visit.   Allergies  Allergen Reactions   Aleve [Naproxen Sodium] Nausea And Vomiting    No problem with other NSAIDs   Amoxicillin Other (See Comments)    Did it involve swelling of the face/tongue/throat, SOB, or low BP? No Did it involve sudden or severe rash/hives, skin peeling, or any reaction on the inside of your mouth or nose? No Did you need to seek medical attention at a hospital or doctor's office? unknown When did it last happen?   yrs ago    dizziness If all  above answers are "NO", may proceed with cephalosporin use.    Morphine And Related     dizzy   Penicillins     Diarrhea Did it involve swelling of the face/tongue/throat, SOB, or low BP? No Did it involve sudden or severe rash/hives, skin peeling, or any reaction on the inside of your mouth or nose? No Did you need to seek medical attention at a hospital or doctor's office? Unknown When did it last happen?    years ago   If all above answers are "NO", may proceed with cephalosporin use.    Social History   Socioeconomic History   Marital status: Married    Spouse name: Financial risk analyst   Number of children: Not on file   Years of education: 12   Highest education level: High school graduate  Occupational History   Occupation: Retired  Tobacco Use   Smoking status: Former    Packs/day: 1.00  Years: 8.00    Pack years: 8.00    Types: Cigarettes   Smokeless tobacco: Never  Vaping Use   Vaping Use: Never used  Substance and Sexual Activity   Alcohol use: Yes    Comment: occasional   Drug use: No   Sexual activity: Yes    Comment: married  Other Topics Concern   Not on file  Social History Narrative   Not on file   Social Determinants of Health   Financial Resource Strain: Low Risk    Difficulty of Paying Living Expenses: Not hard at all  Food Insecurity: No Food Insecurity   Worried About Charity fundraiser in the Last Year: Never true   [place] in the Last Year: Never true  Transportation Needs: No Transportation Needs   Lack of Transportation (Medical): No   Lack of Transportation (Non-Medical): No  Physical Activity: Inactive   Days of Exercise per Week: 0 days   Minutes of Exercise per Session: 0 min  Stress: No Stress Concern Present   Feeling of Stress : Not at all  Social Connections: Moderately Integrated   Frequency of Communication with Friends and Family: More than three times a week   Frequency of Social Gatherings with Friends and Family: More  than three times a week   Attends Religious Services: More than 4 times per year   Active Member of [employer] or Organizations: No   Attends Archivist Meetings: Never   Marital Status: Married  Human resources officer Violence: Not At Risk   Fear of Current or Ex-Partner: No   Emotionally Abused: No   Physically Abused: No   Sexually Abused: No   Family History  Problem Relation Age of Onset   Diabetes Sister    Diabetes Brother    Diabetes Sister    Cancer Sister        breast      Review of Systems  All other systems reviewed and are negative.     Objective:   Physical Exam Vitals reviewed.  Constitutional:      General: He is not in acute distress.    Appearance: He is well-developed. He is not diaphoretic.  HENT:     Head: Normocephalic and atraumatic.     Right Ear: External ear normal.     Left Ear: External ear normal.     Nose: Nose normal.     Mouth/Throat:     Pharynx: No oropharyngeal exudate.  Eyes:     General: No scleral icterus.       Right eye: No discharge.        Left eye: No discharge.     Conjunctiva/sclera: Conjunctivae normal.     Pupils: Pupils are equal, round, and reactive to light.  Neck:     Thyroid: No thyromegaly.     Vascular: No JVD.     Trachea: No tracheal deviation.  Cardiovascular:     Rate and Rhythm: Normal rate and regular rhythm.     Heart sounds: Normal heart sounds. No murmur heard.   No friction rub. No gallop.  Pulmonary:     Effort: Pulmonary effort is normal. No respiratory distress.     Breath sounds: Normal breath sounds. No stridor. No wheezing or rales.  Chest:     Chest wall: No tenderness.  Abdominal:     General: Bowel sounds are normal. There is no distension.     Palpations: Abdomen is soft. There is no mass.  Tenderness: There is no abdominal tenderness. There is no guarding or rebound.  Musculoskeletal:        General: No tenderness. Normal range of motion.     Cervical back: Normal range of  motion and neck supple.  Lymphadenopathy:     Cervical: No cervical adenopathy.  Skin:    General: Skin is warm.     Coloration: Skin is not pale.     Findings: No erythema or rash.  Neurological:     Mental Status: He is alert and oriented to person, place, and time.     Cranial Nerves: No cranial nerve deficit.     Motor: No abnormal muscle tone.     Coordination: Coordination normal.     Deep Tendon Reflexes: Reflexes are normal and symmetric.  Psychiatric:        Behavior: Behavior normal.        Thought Content: Thought content normal.        Judgment: Judgment normal.  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 Examination of his left foot shows normal dorsalis pedis pulses, normal posterior tibialis pulses, normal sensation to 10 g monofilament and no evidence of ulcers or sores. He does have a dystrophic toenail on his left great toe        Assessment & Plan:  General medical exam  Coronary artery disease involving native coronary artery of native heart without angina pectoris  Hyperlipidemia LDL goal <70  Chronic combined systolic and diastolic heart failure (HCC)  Microscopic colitis, unspecified microscopic colitis type Patient's physical exam is normal today.  The patient seems to be doing exceptionally well given his age.  He does have some mild age-related cognitive decline but otherwise is doing well.  I do not feel that this is Alzheimer's disease.  He denies any chest pain or shortness of breath or dyspnea on exertion.  I did encourage the patient to try to avoid the heat of the day and cautioned him about drinking plenty of water to avoid heat exhaustion.  He demonstrates no evidence of congestive heart failure and his most recent echocardiogram showed a preserved ejection fraction.  Blood pressure today is excellent.  Check CBC CMP and fasting lipid panel.  Goal LDL cholesterol is less than 70.  His microscopic  colitis is currently in remission as he denies any diarrhea

## 2020-10-10 LAB — COMPLETE METABOLIC PANEL WITH GFR
AG Ratio: 1.7 (calc) (ref 1.0–2.5)
ALT: 22 U/L (ref 9–46)
AST: 19 U/L (ref 10–35)
Albumin: 3.6 g/dL (ref 3.6–5.1)
Alkaline phosphatase (APISO): 57 U/L (ref 35–144)
BUN: 11 mg/dL (ref 7–25)
CO2: 30 mmol/L (ref 20–32)
Calcium: 9.1 mg/dL (ref 8.6–10.3)
Chloride: 105 mmol/L (ref 98–110)
Creat: 0.83 mg/dL (ref 0.70–1.22)
Globulin: 2.1 g/dL (calc) (ref 1.9–3.7)
Glucose, Bld: 79 mg/dL (ref 65–99)
Potassium: 4 mmol/L (ref 3.5–5.3)
Sodium: 141 mmol/L (ref 135–146)
Total Bilirubin: 1 mg/dL (ref 0.2–1.2)
Total Protein: 5.7 g/dL — ABNORMAL LOW (ref 6.1–8.1)
eGFR: 85 mL/min/{1.73_m2} (ref >=60)

## 2020-10-10 LAB — LIPID PANEL
Cholesterol: 140 mg/dL (ref <200)
HDL: 69 mg/dL (ref >=40)
LDL Cholesterol (Calc): 57 mg/dL (calc)
Non-HDL Cholesterol (Calc): 71 mg/dL (calc) (ref <130)
Total CHOL/HDL Ratio: 2 (calc) (ref <5.0)
Triglycerides: 59 mg/dL (ref <150)

## 2020-10-10 LAB — CBC WITH DIFFERENTIAL/PLATELET

## 2020-10-12 ENCOUNTER — Encounter: Payer: Self-pay | Admitting: *Deleted

## 2020-10-18 ENCOUNTER — Ambulatory Visit: Payer: PPO | Admitting: Family Medicine

## 2020-11-05 ENCOUNTER — Other Ambulatory Visit: Payer: Self-pay

## 2020-11-05 ENCOUNTER — Ambulatory Visit (INDEPENDENT_AMBULATORY_CARE_PROVIDER_SITE_OTHER): Payer: PPO | Admitting: Family Medicine

## 2020-11-05 VITALS — BP 110/65 | HR 58 | Temp 97.0°F | Resp 18 | Wt 143.0 lb

## 2020-11-05 DIAGNOSIS — L820 Inflamed seborrheic keratosis: Secondary | ICD-10-CM

## 2020-11-05 NOTE — Progress Notes (Addendum)
Subjective:    Patient ID: Eugene [name], male    DOB: April 22, 1931, 87 y.o.   MRN: [id number]  HPI  Patient is a very sweet 87 year old gentleman who presents today requesting I removed 2 lesions from his back.  1 is located on the posterior aspect of his neck medial to the right shoulder blade.  Is a 5 mm seborrheic keratosis that is irritated and itches and gets hung on his T-shirt.  The other is a 1.3 cm wartlike papilloma on his lower posterior flank just above his belt line.  Neither of these lesions look cancerous however they do irritate him and itch.  He would like them removed. Past Medical History:  Diagnosis Date   Amputated right leg ([place])    CAD (coronary artery disease)    a. 09/2017 - acute MI -  emergent cath showing 99% stenosis in midLAD with thrombotic stenosis at the bifurcation of the second diagonal, which was relatively small in diameter. He underwent placement of DES to midLAD which jailed the second diagonal.    Chronic combined systolic and diastolic heart failure ([place])    Collagenous colitis    Dr. Festus Holts   History of ST elevation myocardial infarction (STEMI)    09/2017: LAD   History of stomach ulcers    Hyperlipidemia LDL goal <70    Ischemic cardiomyopathy    Squamous cell carcinoma of skin 03/27/2014   in situ-left temple (txpbx)   Squamous cell carcinoma of skin 02/24/2018   in situ-left cheek (CX35FU)   Past Surgical History:  Procedure Laterality Date   BACK SURGERY     CARDIAC CATHETERIZATION     CORONARY STENT INTERVENTION N/A 10/07/2017   Procedure: CORONARY STENT INTERVENTION;  Surgeon: Wellington Hampshire, MD;  Location: [facility];  Service: Cardiovascular;  Laterality: N/A;   CORONARY/GRAFT ACUTE MI REVASCULARIZATION N/A 10/07/2017   Procedure: Coronary/Graft Acute MI Revascularization;  Surgeon: Wellington Hampshire, MD;  Location: [facility];  Service: Cardiovascular;  Laterality: N/A;   LEFT HEART CATH AND CORONARY ANGIOGRAPHY  N/A 10/07/2017   Procedure: LEFT HEART CATH AND CORONARY ANGIOGRAPHY;  Surgeon: Wellington Hampshire, MD;  Location: [facility];  Service: Cardiovascular;  Laterality: N/A;   LEG AMPUTATION     Right Below knee   STOMACH SURGERY     Billroth II gastrojejunostomy   TOTAL KNEE ARTHROPLASTY     Current Outpatient Medications on File Prior to Visit  Medication Sig Dispense Refill   ALPRAZolam (XANAX) 0.5 MG tablet Take 1 tablet (0.5 mg total) by mouth at bedtime as needed. for sleep 30 tablet 0   aspirin 81 MG tablet Take 81 mg by mouth at bedtime.     carvedilol (COREG) 3.125 MG tablet TAKE 1 TABLET BY MOUTH TWICE A DAY WITH MEALS 180 tablet 1   cholestyramine (QUESTRAN) 4 GM/DOSE powder USE 1 SCOOP 3 TIMES A DAY WITH A MEAL (Patient not taking: Reported on 10/08/2020) 756 g 6   fluorouracil (EFUDEX) 5 % cream SMARTSIG:Sparingly Topical Twice Daily     losartan (COZAAR) 25 MG tablet Take 0.5 tablets (12.5 mg total) by mouth daily. NEEDS TO KEEP APPOINTMENT FOR FURTHER REFILLS 45 tablet 0   Multiple Vitamins-Minerals (PRESERVISION AREDS PO) Take 1 tablet by mouth 2 (two) times daily.     nitroGLYCERIN (NITROSTAT) 0.4 MG SL tablet Place 1 tablet (0.4 mg total) under the tongue every 5 (five) minutes x 3 doses as needed for chest  pain. 25 tablet 3   Omega-3 Fatty Acids (FISH OIL PO) Take 1 capsule by mouth at bedtime.     rosuvastatin (CRESTOR) 20 MG tablet TAKE 1 TABLET BY MOUTH EVERY DAY 90 tablet 0   No current facility-administered medications on file prior to visit.   Marland Kitchenall Social History   Socioeconomic History   Marital status: Married    Spouse name: Financial risk analyst   Number of children: Not on file   Years of education: 12   Highest education level: High school graduate  Occupational History   Occupation: Retired  Tobacco Use   Smoking status: Former    Packs/day: 1.00    Years: 8.00    Pack years: 8.00    Types: Cigarettes   Smokeless tobacco: Never  Vaping Use   Vaping Use:  Never used  Substance and Sexual Activity   Alcohol use: Yes    Comment: occasional   Drug use: No   Sexual activity: Yes    Comment: married  Other Topics Concern   Not on file  Social History Narrative   Not on file   Social Determinants of Health   Financial Resource Strain: Low Risk    Difficulty of Paying Living Expenses: Not hard at all  Food Insecurity: No Food Insecurity   Worried About Charity fundraiser in the Last Year: Never true   Arboriculturist in the Last Year: Never true  Transportation Needs: No Transportation Needs   Lack of Transportation (Medical): No   Lack of Transportation (Non-Medical): No  Physical Activity: Inactive   Days of Exercise per Week: 0 days   Minutes of Exercise per Session: 0 min  Stress: No Stress Concern Present   Feeling of Stress : Not at all  Social Connections: Moderately Integrated   Frequency of Communication with Friends and Family: More than three times a week   Frequency of Social Gatherings with Friends and Family: More than three times a week   Attends Religious Services: More than 4 times per year   Active Member of [employer] or Organizations: No   Attends Archivist Meetings: Never   Marital Status: Married  Human resources officer Violence: Not At Risk   Fear of Current or Ex-Partner: No   Emotionally Abused: No   Physically Abused: No   Sexually Abused: No     Review of Systems  All other systems reviewed and are negative.     Objective:   Physical Exam Vitals reviewed.  Constitutional:      Appearance: Normal appearance. He is normal weight.  Cardiovascular:     Rate and Rhythm: Normal rate and regular rhythm.     Heart sounds: Normal heart sounds.  Pulmonary:     Effort: Pulmonary effort is normal.     Breath sounds: Normal breath sounds.  Musculoskeletal:       Back:  Neurological:     Mental Status: He is alert.          Assessment & Plan:  Seborrheic keratoses, inflamed Each lesion was  anesthetized with 0.1% lidocaine with epinephrine.  Both lesions were removed using a shave biopsy technique.  Hemostasis was achieved with both lesions using Drysol, Neosporin, and a Band-Aid.  There was no blood loss.  The lesions were discarded and pathology was not sent as they appeared benign

## 2020-11-21 ENCOUNTER — Telehealth: Payer: Self-pay | Admitting: Pharmacist

## 2020-11-21 NOTE — Progress Notes (Addendum)
    Chronic Care Management Pharmacy Assistant   Name: KHAM MIMMS  MRN: [id number] DOB: September 01, 1931  Reason for Encounter: General Disease State Call   Conditions to be addressed/monitored: Hyperlipidemia, Heart Failure, and Anxiety  Recent office visits:  11/05/20 Dr. Dennard Schaumann For seborrheic keratoses inflamed. No medication changes.  10/08/20 Dr. Dennard Schaumann For medicare wellness. No medication changes.  09/19/20 (Telemedicine) Eulogio Bear, NP. For cough. STARTED Benzonatate 100 mg 2 times daily.   Recent consult visits:  None since 09/13/20  Hospital visits:  None since 09/13/20  Medications: Outpatient Encounter Medications as of 11/21/2020  Medication Sig   ALPRAZolam (XANAX) 0.5 MG tablet Take 1 tablet (0.5 mg total) by mouth at bedtime as needed. for sleep   aspirin 81 MG tablet Take 81 mg by mouth at bedtime.   carvedilol (COREG) 3.125 MG tablet TAKE 1 TABLET BY MOUTH TWICE A DAY WITH MEALS   cholestyramine (QUESTRAN) 4 GM/DOSE powder USE 1 SCOOP 3 TIMES A DAY WITH A MEAL (Patient not taking: Reported on 10/08/2020)   fluorouracil (EFUDEX) 5 % cream SMARTSIG:Sparingly Topical Twice Daily   losartan (COZAAR) 25 MG tablet Take 0.5 tablets (12.5 mg total) by mouth daily. NEEDS TO KEEP APPOINTMENT FOR FURTHER REFILLS   Multiple Vitamins-Minerals (PRESERVISION AREDS PO) Take 1 tablet by mouth 2 (two) times daily.   nitroGLYCERIN (NITROSTAT) 0.4 MG SL tablet Place 1 tablet (0.4 mg total) under the tongue every 5 (five) minutes x 3 doses as needed for chest pain.   Omega-3 Fatty Acids (FISH OIL PO) Take 1 capsule by mouth at bedtime.   rosuvastatin (CRESTOR) 20 MG tablet TAKE 1 TABLET BY MOUTH EVERY DAY   No facility-administered encounter medications on file as of 11/21/2020.   GEN CALL: Patient stated he has swelling in his left ankle. He stated its been going on for awhile but it has not gotten worse since it started. He stated when he lays down nightly it helps reduce the  swelling. He stated he eats very well and drinks plenty of water. He stated he did have a garden but since the weather is changing its about gone per the patient. He stated he does not have any questions or concerns about his medications at this time. He stated he believes his wife needs some assistance with her medication I informed him I would call her today to see if I could help her.   Care Gaps: ED visit per thousand.  Star Rating Drugs: Rosuvastatin 20 mg 09/27/20 90 DS, Losartan 25 mg 10/21/20 90 DS.  Follow-Up:Pharmacist Review  Charlann Lange, RMA Clinical Pharmacist Assistant [phone]  10 minutes spent in review, coordination, and documentation.  Reviewed by: Beverly Milch, PharmD Clinical Pharmacist [phone]

## 2020-12-12 ENCOUNTER — Other Ambulatory Visit: Payer: Self-pay | Admitting: Family Medicine

## 2020-12-12 ENCOUNTER — Other Ambulatory Visit: Payer: Self-pay | Admitting: Cardiovascular Disease

## 2020-12-25 ENCOUNTER — Telehealth: Payer: Self-pay | Admitting: Cardiovascular Disease

## 2020-12-25 NOTE — Telephone Encounter (Signed)
I did not need this encounter. °

## 2021-01-15 ENCOUNTER — Ambulatory Visit (INDEPENDENT_AMBULATORY_CARE_PROVIDER_SITE_OTHER): Payer: PPO | Admitting: Family Medicine

## 2021-01-15 ENCOUNTER — Encounter: Payer: Self-pay | Admitting: Family Medicine

## 2021-01-15 ENCOUNTER — Other Ambulatory Visit: Payer: Self-pay

## 2021-01-15 VITALS — BP 106/68 | HR 80 | Temp 98.0°F | Resp 14 | Ht 70.0 in | Wt 147.0 lb

## 2021-01-15 DIAGNOSIS — Z23 Encounter for immunization: Secondary | ICD-10-CM | POA: Diagnosis not present

## 2021-01-15 DIAGNOSIS — Z89511 Acquired absence of right leg below knee: Secondary | ICD-10-CM | POA: Diagnosis not present

## 2021-01-15 NOTE — Progress Notes (Signed)
Subjective:    Patient ID: Eugene [name], male    DOB: 08-Jul-1931, 87 y.o.   MRN: [id number]  HPI  Patient has a history of a right below the knee amputation. He wears a prosthesis on this leg for ambulation. Under the prosthesis, he wears a prosthetic liner along with thin prosthetic socks. His last prosthetic liner was replaced approximately 2019. Therefore he needs a new prosthetic liner as his current prosthetic liner is completely worn out. He also requires prosthetic socks, the thin variety, for proper functioning and fit of the prosthetic liner into the prosthesis. He is here today because his insurance will not pay for these without documentation in an office visit.   Past Medical History:  Diagnosis Date   Amputated right leg ([place])    CAD (coronary artery disease)    a. 09/2017 - acute MI -  emergent cath showing 99% stenosis in midLAD with thrombotic stenosis at the bifurcation of the second diagonal, which was relatively small in diameter. He underwent placement of DES to midLAD which jailed the second diagonal.    Chronic combined systolic and diastolic heart failure ([place])    Collagenous colitis    Dr. Festus Holts   History of ST elevation myocardial infarction (STEMI)    09/2017: LAD   History of stomach ulcers    Hyperlipidemia LDL goal <70    Ischemic cardiomyopathy    Squamous cell carcinoma of skin 03/27/2014   in situ-left temple (txpbx)   Squamous cell carcinoma of skin 02/24/2018   in situ-left cheek (CX35FU)   Past Surgical History:  Procedure Laterality Date   BACK SURGERY     CARDIAC CATHETERIZATION     CORONARY STENT INTERVENTION N/A 10/07/2017   Procedure: CORONARY STENT INTERVENTION;  Surgeon: Wellington Hampshire, MD;  Location: [facility];  Service: Cardiovascular;  Laterality: N/A;   CORONARY/GRAFT ACUTE MI REVASCULARIZATION N/A 10/07/2017   Procedure: Coronary/Graft Acute MI Revascularization;  Surgeon: Wellington Hampshire, MD;  Location: [facility];  Service: Cardiovascular;  Laterality: N/A;   LEFT HEART CATH AND CORONARY ANGIOGRAPHY N/A 10/07/2017   Procedure: LEFT HEART CATH AND CORONARY ANGIOGRAPHY;  Surgeon: Wellington Hampshire, MD;  Location: [facility];  Service: Cardiovascular;  Laterality: N/A;   LEG AMPUTATION     Right Below knee   STOMACH SURGERY     Billroth II gastrojejunostomy   TOTAL KNEE ARTHROPLASTY     Current Outpatient Medications on File Prior to Visit  Medication Sig Dispense Refill   ALPRAZolam (XANAX) 0.5 MG tablet Take 1 tablet (0.5 mg total) by mouth at bedtime as needed. for sleep 30 tablet 0   aspirin 81 MG tablet Take 81 mg by mouth at bedtime.     carvedilol (COREG) 3.125 MG tablet TAKE 1 TABLET BY MOUTH TWICE A DAY WITH MEALS 180 tablet 1   cholestyramine (QUESTRAN) 4 GM/DOSE powder USE 1 SCOOP 3 TIMES A DAY WITH A MEAL (Patient not taking: Reported on 10/08/2020) 756 g 6   fluorouracil (EFUDEX) 5 % cream SMARTSIG:Sparingly Topical Twice Daily     losartan (COZAAR) 25 MG tablet Take 1 tablet (25 mg total) by mouth daily. Please keep upcoming appt for future refills Thank you 3rd and final attempt 30 tablet 0   Multiple Vitamins-Minerals (PRESERVISION AREDS PO) Take 1 tablet by mouth 2 (two) times daily.     nitroGLYCERIN (NITROSTAT) 0.4 MG SL tablet Place 1 tablet (0.4 mg total) under the tongue  every 5 (five) minutes x 3 doses as needed for chest pain. 25 tablet 3   Omega-3 Fatty Acids (FISH OIL PO) Take 1 capsule by mouth at bedtime.     rosuvastatin (CRESTOR) 20 MG tablet TAKE 1 TABLET BY MOUTH EVERY DAY 90 tablet 0   No current facility-administered medications on file prior to visit.   Allergies  Allergen Reactions   Aleve [Naproxen Sodium] Nausea And Vomiting    No problem with other NSAIDs   Amoxicillin Other (See Comments)    Did it involve swelling of the face/tongue/throat, SOB, or low BP? No Did it involve sudden or severe rash/hives, skin peeling, or any reaction on the inside of  your mouth or nose? No Did you need to seek medical attention at a hospital or doctor's office? unknown When did it last happen?   yrs ago    dizziness If all above answers are "NO", may proceed with cephalosporin use.    Morphine And Related     dizzy   Penicillins     Diarrhea Did it involve swelling of the face/tongue/throat, SOB, or low BP? No Did it involve sudden or severe rash/hives, skin peeling, or any reaction on the inside of your mouth or nose? No Did you need to seek medical attention at a hospital or doctor's office? Unknown When did it last happen?    years ago   If all above answers are "NO", may proceed with cephalosporin use.    Social History   Socioeconomic History   Marital status: Married    Spouse name: Financial risk analyst   Number of children: Not on file   Years of education: 12   Highest education level: High school graduate  Occupational History   Occupation: Retired  Tobacco Use   Smoking status: Former    Packs/day: 1.00    Years: 8.00    Pack years: 8.00    Types: Cigarettes   Smokeless tobacco: Never  Vaping Use   Vaping Use: Never used  Substance and Sexual Activity   Alcohol use: Yes    Comment: occasional   Drug use: No   Sexual activity: Yes    Comment: married  Other Topics Concern   Not on file  Social History Narrative   Not on file   Social Determinants of Health   Financial Resource Strain: Low Risk    Difficulty of Paying Living Expenses: Not hard at all  Food Insecurity: No Food Insecurity   Worried About Charity fundraiser in the Last Year: Never true   Arboriculturist in the Last Year: Never true  Transportation Needs: No Transportation Needs   Lack of Transportation (Medical): No   Lack of Transportation (Non-Medical): No  Physical Activity: Inactive   Days of Exercise per Week: 0 days   Minutes of Exercise per Session: 0 min  Stress: No Stress Concern Present   Feeling of Stress : Not at all  Social Connections: Moderately  Integrated   Frequency of Communication with Friends and Family: More than three times a week   Frequency of Social Gatherings with Friends and Family: More than three times a week   Attends Religious Services: More than 4 times per year   Active Member of [employer] or Organizations: No   Attends Archivist Meetings: Never   Marital Status: Married  Human resources officer Violence: Not At Risk   Fear of Current or Ex-Partner: No   Emotionally Abused: No   Physically Abused:  No   Sexually Abused: No   Review of Systems  All other systems reviewed and are negative.     Objective:   Physical Exam Vitals reviewed.  Cardiovascular:     Rate and Rhythm: Normal rate and regular rhythm.     Heart sounds: Normal heart sounds.  Pulmonary:     Effort: Pulmonary effort is normal. No respiratory distress.     Breath sounds: Normal breath sounds. No wheezing or rales.   Right below the knee amputation. Please see history of present illness for further information       Assessment & Plan:  History of right below knee amputation ([place])  I will fax this office note directly to his prosthetic company. Hopefully this will be sufficient. His prosthetic liners are completely worn out. He needs silicone locking liners for his right below the knee prosthesis. He also requires new thin prosthetic socks which fit over the liner and allow proper fit into the prosthesis itself for proper functioning. His previous socks are also worn out.

## 2021-01-22 ENCOUNTER — Other Ambulatory Visit: Payer: Self-pay | Admitting: Cardiovascular Disease

## 2021-01-22 ENCOUNTER — Other Ambulatory Visit: Payer: Self-pay | Admitting: Family Medicine

## 2021-01-29 NOTE — Progress Notes (Signed)
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 He was admitted to [facility] in July 2019 with an anterior ST elevation MI. Cardiac cath showed a 99% mid LAD stenosis. A drug eluting stent was placed in the mid LAD. Echo 10/08/17 with LVEF=35-40% with akinesis of the anterior and apical walls. Echo December 2021 with LVEF=55-60%, mild AS, trivial MR.   He is here today for follow up. The patient denies any chest pain, dyspnea, palpitations, lower extremity edema, orthopnea, PND, dizziness, near syncope or syncope. He is very active.   Primary Care Physician: Susy Frizzle, MD  Past Medical History:  Diagnosis Date   Amputated right leg ([place])    CAD (coronary artery disease)    a. 09/2017 - acute MI -  emergent cath showing 99% stenosis in midLAD with thrombotic stenosis at the bifurcation of the second diagonal, which was relatively small in diameter. He underwent placement of DES to midLAD which jailed the second diagonal.    Chronic combined systolic and diastolic heart failure ([place])    Collagenous colitis    Dr. Festus Holts   History of ST elevation myocardial infarction (STEMI)    09/2017: LAD   History of stomach ulcers    Hyperlipidemia LDL goal <70    Ischemic cardiomyopathy    Squamous cell carcinoma of skin 03/27/2014   in situ-left temple (txpbx)   Squamous cell carcinoma of skin 02/24/2018   in situ-left cheek (CX35FU)    Past Surgical History:  Procedure Laterality Date   BACK SURGERY     CARDIAC CATHETERIZATION     CORONARY STENT INTERVENTION N/A 10/07/2017   Procedure: CORONARY STENT INTERVENTION;  Surgeon: Wellington Hampshire, MD;  Location: [facility];  Service: Cardiovascular;  Laterality: N/A;   CORONARY/GRAFT ACUTE MI  REVASCULARIZATION N/A 10/07/2017   Procedure: Coronary/Graft Acute MI Revascularization;  Surgeon: Wellington Hampshire, MD;  Location: [facility];  Service: Cardiovascular;  Laterality: N/A;   LEFT HEART CATH AND CORONARY ANGIOGRAPHY N/A 10/07/2017   Procedure: LEFT HEART CATH AND CORONARY ANGIOGRAPHY;  Surgeon: Wellington Hampshire, MD;  Location: [facility];  Service: Cardiovascular;  Laterality: N/A;   LEG AMPUTATION     Right Below knee   STOMACH SURGERY     Billroth II gastrojejunostomy   TOTAL KNEE ARTHROPLASTY      Current Outpatient Medications  Medication Sig Dispense Refill   ALPRAZolam (XANAX) 0.5 MG tablet Take 1 tablet (0.5 mg total) by mouth at bedtime as needed. for sleep 30 tablet 0   aspirin 81 MG tablet Take 81 mg by mouth at bedtime.     carvedilol (COREG) 3.125 MG tablet TAKE 1 TABLET BY MOUTH TWICE A DAY WITH MEALS 180 tablet 1   cholestyramine (QUESTRAN) 4 GM/DOSE powder USE 1 SCOOP 3 TIMES A DAY WITH A MEAL 756 g 6   fluorouracil (EFUDEX) 5 % cream SMARTSIG:Sparingly Topical Twice Daily     losartan (COZAAR) 25 MG tablet TAKE 1 TABLET BY MOUTH DAILY. PLEASE KEEP UPCOMING APPT FOR FUTURE REFILLS THANK YOU 30 tablet 11   Multiple Vitamins-Minerals (PRESERVISION AREDS PO) Take 1 tablet by mouth 2 (two) times daily.     nitroGLYCERIN (NITROSTAT) 0.4 MG SL tablet Place  1 tablet (0.4 mg total) under the tongue every 5 (five) minutes x 3 doses as needed for chest pain. 25 tablet 3   Omega-3 Fatty Acids (FISH OIL PO) Take 1 capsule by mouth at bedtime.     rosuvastatin (CRESTOR) 20 MG tablet TAKE 1 TABLET BY MOUTH EVERY DAY 90 tablet 0   No current facility-administered medications for this visit.    Allergies  Allergen Reactions   Aleve [Naproxen Sodium] Nausea And Vomiting    No problem with other NSAIDs   Amoxicillin Other (See Comments)    Did it involve swelling of the face/tongue/throat, SOB, or low BP? No Did it involve sudden or severe rash/hives, skin  peeling, or any reaction on the inside of your mouth or nose? No Did you need to seek medical attention at a hospital or doctor's office? unknown When did it last happen?   yrs ago    dizziness If all above answers are "NO", may proceed with cephalosporin use.    Morphine And Related     dizzy   Penicillins     Diarrhea Did it involve swelling of the face/tongue/throat, SOB, or low BP? No Did it involve sudden or severe rash/hives, skin peeling, or any reaction on the inside of your mouth or nose? No Did you need to seek medical attention at a hospital or doctor's office? Unknown When did it last happen?    years ago   If all above answers are "NO", may proceed with cephalosporin use.     Social History   Socioeconomic History   Marital status: Married    Spouse name: Financial risk analyst   Number of children: Not on file   Years of education: 12   Highest education level: High school graduate  Occupational History   Occupation: Retired  Tobacco Use   Smoking status: Former    Packs/day: 1.00    Years: 8.00    Pack years: 8.00    Types: Cigarettes   Smokeless tobacco: Never  Vaping Use   Vaping Use: Never used  Substance and Sexual Activity   Alcohol use: Yes    Comment: occasional   Drug use: No   Sexual activity: Yes    Comment: married  Other Topics Concern   Not on file  Social History Narrative   Not on file   Social Determinants of Health   Financial Resource Strain: Low Risk    Difficulty of Paying Living Expenses: Not hard at all  Food Insecurity: No Food Insecurity   Worried About Charity fundraiser in the Last Year: Never true   Arboriculturist in the Last Year: Never true  Transportation Needs: No Transportation Needs   Lack of Transportation (Medical): No   Lack of Transportation (Non-Medical): No  Physical Activity: Inactive   Days of Exercise per Week: 0 days   Minutes of Exercise per Session: 0 min  Stress: No Stress Concern Present   Feeling of Stress :  Not at all  Social Connections: Moderately Integrated   Frequency of Communication with Friends and Family: More than three times a week   Frequency of Social Gatherings with Friends and Family: More than three times a week   Attends Religious Services: More than 4 times per year   Active Member of [employer] or Organizations: No   Attends Archivist Meetings: Never   Marital Status: Married  Human resources officer Violence: Not At Risk   Fear of Current or Ex-Partner: No  Emotionally Abused: No   Physically Abused: No   Sexually Abused: No    Family History  Problem Relation Age of Onset   Diabetes Sister    Diabetes Brother    Diabetes Sister    Cancer Sister        breast    Review of Systems:  As stated in the HPI and otherwise negative.   BP 126/72   Pulse 64   Ht 5\' 10"  (1.778 m)   Wt 146 lb 9.6 oz (66.5 kg)   SpO2 98%   BMI 21.03 kg/m   Physical Examination:  General: Well developed, well nourished, NAD  HEENT: OP clear, mucus membranes moist  SKIN: warm, dry. No rashes. Neuro: No focal deficits  Musculoskeletal: Muscle strength 5/5 all ext  Psychiatric: Mood and affect normal  Neck: No JVD, no carotid bruits, no thyromegaly, no lymphadenopathy.  Lungs:Clear bilaterally, no wheezes, rhonci, crackles Cardiovascular: Regular rate and rhythm. No murmurs, gallops or rubs. Abdomen:Soft. Bowel sounds present. Non-tender.  Extremities: No lower extremity edema. Pulses are 2 + in the bilateral DP/PT.  EKG:  EKG is ordered today. The ekg ordered today demonstrates sinus, rate 64 bpm  Echo December 2021:  1. Left ventricular ejection fraction, by estimation, is 55 to 60%. The  left ventricle has normal function. The left ventricle has no regional  wall motion abnormalities. There is mild left ventricular hypertrophy.  Left ventricular diastolic parameters  are consistent with Grade I diastolic dysfunction (impaired relaxation).   2. Right ventricular systolic  function is normal. The right ventricular  size is normal. There is normal pulmonary artery systolic pressure. The  estimated right ventricular systolic pressure is 29.5 mmHg.   3. Left atrial size was mildly dilated.   4. The mitral valve is degenerative. Trivial mitral valve regurgitation.  No evidence of mitral stenosis.   5. The aortic valve is abnormal. There is moderate calcification of the  aortic valve. Aortic valve regurgitation is trivial. Mild aortic valve  stenosis. Aortic valve mean gradient measures 12.0 mmHg.   6. The inferior vena cava is normal in size with greater than 50%  respiratory variability, suggesting right atrial pressure of 3 mmHg.   Recent Labs: 10/08/2020: ALT 22; BUN 11; Creat 0.83; Potassium 4.0; Sodium 141   Lipid Panel    Component Value Date/Time   CHOL 140 10/08/2020 1138   CHOL 112 11/09/2017 1440   TRIG 59 10/08/2020 1138   HDL 69 10/08/2020 1138   HDL 52 11/09/2017 1440   CHOLHDL 2.0 10/08/2020 1138   VLDL 23 10/07/2017 1628   LDLCALC 57 10/08/2020 1138     Wt Readings from Last 3 Encounters:  01/30/21 146 lb 9.6 oz (66.5 kg)  01/15/21 147 lb (66.7 kg)  11/05/20 143 lb (64.9 kg)   Other studies Reviewed: Additional studies/ records that were reviewed today include:  Review of the above records demonstrates:   Assessment and Plan:   1. CAD without angina: Admitted with anterior STEMI July 2019. A drug eluting stent was placed in the LAD. No chest pain. Continue ASA, statin and beta blocker.    2. Ischemic cardiomyopathy: LV function normal by echo December 2021. Continue beta blocker and ARB.   3. Hyperlipidemia: LDL at goal July 2022. Will continue statin.    4. Aortic stenosis: Mild by echo December 2021. Repeat echo in December 2023.  Current medicines are reviewed at length with the patient today.  The patient does not have concerns regarding  medicines.  The following changes have been made:  no change  Labs/ tests ordered  today include:   Orders Placed This Encounter  Procedures   EKG 12-Lead    Disposition:   F/U with me in 12 months.    Signed, Lauree Chandler, MD 01/30/2021 11:34 AM    [facility] [place], [place], [place]  [address] Phone: [phone]; Fax: [phone]

## 2021-01-30 ENCOUNTER — Other Ambulatory Visit: Payer: Self-pay

## 2021-01-30 ENCOUNTER — Encounter: Payer: Self-pay | Admitting: Cardiovascular Disease

## 2021-01-30 ENCOUNTER — Ambulatory Visit: Payer: PPO | Admitting: Cardiovascular Disease

## 2021-01-30 VITALS — BP 126/72 | HR 64 | Ht 70.0 in | Wt 146.6 lb

## 2021-01-30 DIAGNOSIS — H353133 Nonexudative age-related macular degeneration, bilateral, advanced atrophic without subfoveal involvement: Secondary | ICD-10-CM | POA: Diagnosis not present

## 2021-01-30 DIAGNOSIS — I2510 Atherosclerotic heart disease of native coronary artery without angina pectoris: Secondary | ICD-10-CM | POA: Diagnosis not present

## 2021-01-30 DIAGNOSIS — E7800 Pure hypercholesterolemia, unspecified: Secondary | ICD-10-CM | POA: Diagnosis not present

## 2021-01-30 DIAGNOSIS — I350 Nonrheumatic aortic (valve) stenosis: Secondary | ICD-10-CM

## 2021-01-30 DIAGNOSIS — H52203 Unspecified astigmatism, bilateral: Secondary | ICD-10-CM | POA: Diagnosis not present

## 2021-01-30 DIAGNOSIS — I255 Ischemic cardiomyopathy: Secondary | ICD-10-CM

## 2021-01-30 DIAGNOSIS — Z961 Presence of intraocular lens: Secondary | ICD-10-CM | POA: Diagnosis not present

## 2021-01-30 NOTE — Patient Instructions (Signed)

## 2021-02-12 ENCOUNTER — Telehealth: Payer: Self-pay | Admitting: Cardiovascular Disease

## 2021-02-12 NOTE — Telephone Encounter (Signed)
Current medication list has losartan 25 mg daily.

## 2021-02-12 NOTE — Telephone Encounter (Signed)
Pt c/o medication issue:  1. Name of Medication: losartan (COZAAR) 25 MG tablet  2. How are you currently taking this medication (dosage and times per day)? Wife is not sure   3. Are you having a reaction (difficulty breathing--STAT)?  4. What is your medication issue? Wife wants to know if the patient should start taking a whole tablet or a half a tablet.  He was taking half a tablet, but the new rx the pharmacy has is for a whole tablet. She wants to make sure the patient is taking the correct dose.  Please advise

## 2021-02-12 NOTE — Telephone Encounter (Signed)
Returned call to patient's wife.  She said at one point the patient was only taking 1/2 losartan tablet and she wanted to know if he should take a whole tab like his bottle says.  She thought losartan was potassium.  I explained that it is for his heart and will lower BP a little bit.  Since his last ov with Dr. Angelena Form he has been taking a whole tablet.  She monitors bp at home occasionally.  Adv if she gets systolic readings 932T or less or if pt feels lightheaded or dizzy that they should call and let Dr. Angelena Form know.    Pt was appreciative for information provided.

## 2021-02-26 ENCOUNTER — Ambulatory Visit (INDEPENDENT_AMBULATORY_CARE_PROVIDER_SITE_OTHER): Payer: PPO | Admitting: Family Medicine

## 2021-02-26 ENCOUNTER — Other Ambulatory Visit: Payer: Self-pay

## 2021-02-26 ENCOUNTER — Encounter: Payer: Self-pay | Admitting: Family Medicine

## 2021-02-26 VITALS — BP 138/72 | HR 76 | Ht 70.0 in | Wt 149.0 lb

## 2021-02-26 DIAGNOSIS — S81802A Unspecified open wound, left lower leg, initial encounter: Secondary | ICD-10-CM

## 2021-02-26 DIAGNOSIS — T148XXA Other injury of unspecified body region, initial encounter: Secondary | ICD-10-CM

## 2021-02-26 NOTE — Progress Notes (Signed)
Subjective:    Patient ID: Eugene [name], male    DOB: 1931/07/05, 87 y.o.   MRN: [id number]  HPI Patient is a very pleasant 87 year old Caucasian gentleman who presents today with a wound on his lateral left shin.  He states its been there for about a week.  He does not remember how he hurt it.  There is a T-shaped abrasion that is very superficial.  It is roughly 3 cm x 3 cm.  There is a hard scab forming in the center.  Around the scab, the patient is draining serous fluid.  He has been leaving the wound open to the air to try to let it "dry out".  He denies any fevers or chills.  There is no surrounding erythema warmth fluctuance. Past Medical History:  Diagnosis Date   Amputated right leg ([place])    CAD (coronary artery disease)    a. 09/2017 - acute MI -  emergent cath showing 99% stenosis in midLAD with thrombotic stenosis at the bifurcation of the second diagonal, which was relatively small in diameter. He underwent placement of DES to midLAD which jailed the second diagonal.    Chronic combined systolic and diastolic heart failure ([place])    Collagenous colitis    Dr. Festus Holts   History of ST elevation myocardial infarction (STEMI)    09/2017: LAD   History of stomach ulcers    Hyperlipidemia LDL goal <70    Ischemic cardiomyopathy    Squamous cell carcinoma of skin 03/27/2014   in situ-left temple (txpbx)   Squamous cell carcinoma of skin 02/24/2018   in situ-left cheek (CX35FU)   Past Surgical History:  Procedure Laterality Date   BACK SURGERY     CARDIAC CATHETERIZATION     CORONARY STENT INTERVENTION N/A 10/07/2017   Procedure: CORONARY STENT INTERVENTION;  Surgeon: Wellington Hampshire, MD;  Location: [facility];  Service: Cardiovascular;  Laterality: N/A;   CORONARY/GRAFT ACUTE MI REVASCULARIZATION N/A 10/07/2017   Procedure: Coronary/Graft Acute MI Revascularization;  Surgeon: Wellington Hampshire, MD;  Location: [facility];  Service: Cardiovascular;  Laterality:  N/A;   LEFT HEART CATH AND CORONARY ANGIOGRAPHY N/A 10/07/2017   Procedure: LEFT HEART CATH AND CORONARY ANGIOGRAPHY;  Surgeon: Wellington Hampshire, MD;  Location: [facility];  Service: Cardiovascular;  Laterality: N/A;   LEG AMPUTATION     Right Below knee   STOMACH SURGERY     Billroth II gastrojejunostomy   TOTAL KNEE ARTHROPLASTY     Current Outpatient Medications on File Prior to Visit  Medication Sig Dispense Refill   ALPRAZolam (XANAX) 0.5 MG tablet Take 1 tablet (0.5 mg total) by mouth at bedtime as needed. for sleep 30 tablet 0   aspirin 81 MG tablet Take 81 mg by mouth at bedtime.     carvedilol (COREG) 3.125 MG tablet TAKE 1 TABLET BY MOUTH TWICE A DAY WITH MEALS 180 tablet 1   cholestyramine (QUESTRAN) 4 GM/DOSE powder USE 1 SCOOP 3 TIMES A DAY WITH A MEAL 756 g 6   fluorouracil (EFUDEX) 5 % cream SMARTSIG:Sparingly Topical Twice Daily     losartan (COZAAR) 25 MG tablet TAKE 1 TABLET BY MOUTH DAILY. PLEASE KEEP UPCOMING APPT FOR FUTURE REFILLS THANK YOU 30 tablet 11   Multiple Vitamins-Minerals (PRESERVISION AREDS PO) Take 1 tablet by mouth 2 (two) times daily.     nitroGLYCERIN (NITROSTAT) 0.4 MG SL tablet Place 1 tablet (0.4 mg total) under the tongue every  5 (five) minutes x 3 doses as needed for chest pain. 25 tablet 3   Omega-3 Fatty Acids (FISH OIL PO) Take 1 capsule by mouth at bedtime.     rosuvastatin (CRESTOR) 20 MG tablet TAKE 1 TABLET BY MOUTH EVERY DAY 90 tablet 0   No current facility-administered medications on file prior to visit.   Marland Kitchenall Social History   Socioeconomic History   Marital status: Married    Spouse name: Financial risk analyst   Number of children: Not on file   Years of education: 12   Highest education level: High school graduate  Occupational History   Occupation: Retired  Tobacco Use   Smoking status: Former    Packs/day: 1.00    Years: 8.00    Pack years: 8.00    Types: Cigarettes   Smokeless tobacco: Never  Vaping Use   Vaping Use: Never  used  Substance and Sexual Activity   Alcohol use: Yes    Comment: occasional   Drug use: No   Sexual activity: Yes    Comment: married  Other Topics Concern   Not on file  Social History Narrative   Not on file   Social Determinants of Health   Financial Resource Strain: Low Risk    Difficulty of Paying Living Expenses: Not hard at all  Food Insecurity: No Food Insecurity   Worried About Charity fundraiser in the Last Year: Never true   Arboriculturist in the Last Year: Never true  Transportation Needs: No Transportation Needs   Lack of Transportation (Medical): No   Lack of Transportation (Non-Medical): No  Physical Activity: Inactive   Days of Exercise per Week: 0 days   Minutes of Exercise per Session: 0 min  Stress: No Stress Concern Present   Feeling of Stress : Not at all  Social Connections: Moderately Integrated   Frequency of Communication with Friends and Family: More than three times a week   Frequency of Social Gatherings with Friends and Family: More than three times a week   Attends Religious Services: More than 4 times per year   Active Member of [employer] or Organizations: No   Attends Archivist Meetings: Never   Marital Status: Married  Human resources officer Violence: Not At Risk   Fear of Current or Ex-Partner: No   Emotionally Abused: No   Physically Abused: No   Sexually Abused: No     Review of Systems  All other systems reviewed and are negative.     Objective:   Physical Exam Vitals reviewed.  Constitutional:      Appearance: Normal appearance. He is normal weight.  Cardiovascular:     Rate and Rhythm: Normal rate and regular rhythm.     Heart sounds: Normal heart sounds.  Pulmonary:     Effort: Pulmonary effort is normal.     Breath sounds: Normal breath sounds.  Musculoskeletal:     Left lower leg: Deformity, laceration and tenderness present.       Legs:  Neurological:     Mental Status: He is alert.           Assessment & Plan:  Open wound of skin I recommended the patient keep the wound covered to try to facilitate healing by keeping the wound moist.  I applied Neosporin to the wound bed and then covered it with nonadherent gauze.  I then wrapped the gauze with Coban.  Recommended making daily dressing changes in a similar fashion until healed.  Anticipate gradual healing over the next 7 days.  Recheck in 1 week if no better or sooner if worsening.  Monitor for secondary cellulitis.

## 2021-03-04 NOTE — Progress Notes (Signed)
Chronic Care Management Pharmacy Note  03/15/2021 Name:  Eugene [name] MRN:  [id number] DOB:  1931/08/14  Summary: PharmD follow up for general check in.  Medications updated and reviewed,  Recommendations/Changes made from today's visit: No changes/recs at this visit  Plan: FU In 6 months IN PERSON   Subjective: Eugene [name] is an 87 y.o. year old male who is a primary patient of Pickard, Cammie Mcgee, MD.  The CCM team was consulted for assistance with disease management and care coordination needs.    Engaged with patient by telephone for follow up visit in response to provider referral for pharmacy case management and/or care coordination services.   Consent to Services:  The patient was given the following information about Chronic Care Management services today, agreed to services, and gave verbal consent: 1. CCM service includes personalized support from designated clinical staff supervised by the primary care provider, including individualized plan of care and coordination with other care providers 2. 24/7 contact phone numbers for assistance for urgent and routine care needs. 3. Service will only be billed when office clinical staff spend 20 minutes or more in a month to coordinate care. 4. Only one practitioner may furnish and bill the service in a calendar month. 5.The patient may stop CCM services at any time (effective at the end of the month) by phone call to the office staff. 6. The patient will be responsible for cost sharing (co-pay) of up to 20% of the service fee (after annual deductible is met). Patient agreed to services and consent obtained.  Patient Care Team: Susy Frizzle, MD as PCP - General (Family Medicine) Burnell Blanks, MD as PCP - Cardiology (Cardiology) Edythe Clarity, [facility] as Pharmacist (Pharmacist) Starlyn Skeans as Physician Assistant (Dermatology)  Recent office visits:  02/26/21 Dennard Schaumann) - open wound, treated and put  neosporin on  11/05/20 Dr. Dennard Schaumann For seborrheic keratoses inflamed. No medication changes.  10/08/20 Dr. Dennard Schaumann For medicare wellness. No medication changes.  09/19/20 (Telemedicine) Eulogio Bear, NP. For cough. STARTED Benzonatate 100 mg 2 times daily.    Recent consult visits:  None since 09/13/20   Hospital visits:  None since 09/13/20   Objective:  Lab Results  Component Value Date   CREATININE 0.83 10/08/2020   BUN 11 10/08/2020   GFRNONAA 79 01/03/2020   GFRAA 92 01/03/2020   NA 141 10/08/2020   K 4.0 10/08/2020   CALCIUM 9.1 10/08/2020   CO2 30 10/08/2020   GLUCOSE 79 10/08/2020    No results found for: HGBA1C, FRUCTOSAMINE, GFR, MICROALBUR  Last diabetic Eye exam: No results found for: HMDIABEYEEXA  Last diabetic Foot exam: No results found for: HMDIABFOOTEX   Lab Results  Component Value Date   CHOL 140 10/08/2020   HDL 69 10/08/2020   LDLCALC 57 10/08/2020   TRIG 59 10/08/2020   CHOLHDL 2.0 10/08/2020    Hepatic Function Latest Ref Rng & Units 10/08/2020 01/03/2020 10/06/2019  Total Protein 6.1 - 8.1 g/dL 5.7(L) 5.4(L) 5.9(L)  Albumin 3.5 - 4.7 g/dL - - -  AST 10 - 35 U/L 19 25 22   ALT 9 - 46 U/L 22 30 25   Alk Phosphatase 39 - 117 IU/L - - -  Total Bilirubin 0.2 - 1.2 mg/dL 1.0 0.8 0.9  Bilirubin, Direct 0.00 - 0.40 mg/dL - - -    Lab Results  Component Value Date/Time   TSH 1.60 10/23/2015 08:23 AM   TSH 1.127 09/13/2014 08:46 AM  CBC Latest Ref Rng & Units 10/08/2020 09/22/2019 10/25/2018  WBC - CANCELED 7.0 7.0  Hemoglobin 13.2 - 17.1 g/dL - 13.5 13.4  Hematocrit 38.5 - 50.0 % - 40.6 40.8  Platelets 140 - 400 Thousand/uL - 240 277    No results found for: VD25OH  Clinical ASCVD: Yes  The ASCVD Risk score (Arnett DK, et al., 2019) failed to calculate for the following reasons:   The 2019 ASCVD risk score is only valid for ages 75 to 63   The patient has a prior MI or stroke diagnosis    Depression screen [facility] 2/9 10/08/2020 08/16/2020  09/29/2019  Decreased Interest 0 0 0  Down, Depressed, Hopeless 0 0 0  PHQ - 2 Score 0 0 0  Altered sleeping - - -  Tired, decreased energy - - -  Change in appetite - - -  Feeling bad or failure about yourself  - - -  Trouble concentrating - - -  Moving slowly or fidgety/restless - - -  Suicidal thoughts - - -  PHQ-9 Score - - -  Difficult doing work/chores - - -     Social History   Tobacco Use  Smoking Status Former   Packs/day: 1.00   Years: 8.00   Pack years: 8.00   Types: Cigarettes  Smokeless Tobacco Never   BP Readings from Last 3 Encounters:  02/26/21 138/72  01/30/21 126/72  01/15/21 106/68   Pulse Readings from Last 3 Encounters:  02/26/21 76  01/30/21 64  01/15/21 80   Wt Readings from Last 3 Encounters:  02/26/21 149 lb (67.6 kg)  01/30/21 146 lb 9.6 oz (66.5 kg)  01/15/21 147 lb (66.7 kg)   BMI Readings from Last 3 Encounters:  02/26/21 21.38 kg/m  01/30/21 21.03 kg/m  01/15/21 21.09 kg/m    Assessment/Interventions: Review of patient past medical history, allergies, medications, health status, including review of consultants reports, laboratory and other test data, was performed as part of comprehensive evaluation and provision of chronic care management services.   SDOH:  (Social Determinants of Health) assessments and interventions performed: Yes  Financial Resource Strain: Low Risk    Difficulty of Paying Living Expenses: Not hard at all    SDOH Screenings   Alcohol Screen: Low Risk    Last Alcohol Screening Score (AUDIT): 4  Depression (PHQ2-9): Low Risk    PHQ-2 Score: 0  Financial Resource Strain: Low Risk    Difficulty of Paying Living Expenses: Not hard at all  Food Insecurity: No Food Insecurity   Worried About Charity fundraiser in the Last Year: Never true   Ran Out of Food in the Last Year: Never true  Housing: Low Risk    Last Housing Risk Score: 0  Physical Activity: Inactive   Days of Exercise per Week: 0 days    Minutes of Exercise per Session: 0 min  Social Connections: Moderately Integrated   Frequency of Communication with Friends and Family: More than three times a week   Frequency of Social Gatherings with Friends and Family: More than three times a week   Attends Religious Services: More than 4 times per year   Active Member of [employer] or Organizations: No   Attends Archivist Meetings: Never   Marital Status: Married  Stress: No Stress Concern Present   Feeling of Stress : Not at all  Tobacco Use: Medium Risk   Smoking Tobacco Use: Former   Smokeless Tobacco Use: Never   Passive Exposure:  Not on file  Transportation Needs: No Transportation Needs   Lack of Transportation (Medical): No   Lack of Transportation (Non-Medical): No    CCM Care Plan  Allergies  Allergen Reactions   Aleve [Naproxen Sodium] Nausea And Vomiting    No problem with other NSAIDs   Amoxicillin Other (See Comments)    Did it involve swelling of the face/tongue/throat, SOB, or low BP? No Did it involve sudden or severe rash/hives, skin peeling, or any reaction on the inside of your mouth or nose? No Did you need to seek medical attention at a hospital or doctor's office? unknown When did it last happen?   yrs ago    dizziness If all above answers are NO, may proceed with cephalosporin use.    Morphine And Related     dizzy   Penicillins     Diarrhea Did it involve swelling of the face/tongue/throat, SOB, or low BP? No Did it involve sudden or severe rash/hives, skin peeling, or any reaction on the inside of your mouth or nose? No Did you need to seek medical attention at a hospital or doctor's office? Unknown When did it last happen?    years ago   If all above answers are NO, may proceed with cephalosporin use.     Medications Reviewed Today     Reviewed by Edythe Clarity, [facility] (Pharmacist) on 03/15/21 at 1411  Med List Status: <None>   Medication Order Taking? Sig Documenting  Provider Last Dose Status Informant  ALPRAZolam (XANAX) 0.5 MG tablet [id number] Yes Take 1 tablet (0.5 mg total) by mouth at bedtime as needed. for sleep Susy Frizzle, MD Taking Active   aspirin 81 MG tablet [id number] Yes Take 81 mg by mouth at bedtime. [provider] Taking Active Multiple Informants           Med Note Joan Mayans Feb 04, 2018 10:19 AM)    carvedilol (COREG) 3.125 MG tablet [id number] Yes TAKE 1 TABLET BY MOUTH TWICE A DAY WITH MEALS Burnell Blanks, MD Taking Active   cholestyramine Lucrezia Starch) 4 GM/DOSE powder [id number] No USE 1 SCOOP 3 TIMES A DAY WITH A MEAL  Patient not taking: Reported on 03/15/2021   Susy Frizzle, MD Not Taking Active   fluorouracil (EFUDEX) 5 % cream [id number] Yes SMARTSIG:Sparingly Topical Twice Daily [provider] Taking Active   losartan (COZAAR) 25 MG tablet [id number] Yes TAKE 1 TABLET BY MOUTH DAILY. PLEASE KEEP UPCOMING APPT FOR FUTURE REFILLS THANK YOU Burnell Blanks, MD Taking Active   Multiple Vitamins-Minerals (PRESERVISION AREDS PO) [id number] Yes Take 1 tablet by mouth 2 (two) times daily. [provider] Taking Active Multiple Informants  nitroGLYCERIN (NITROSTAT) 0.4 MG SL tablet [id number] Yes Place 1 tablet (0.4 mg total) under the tongue every 5 (five) minutes x 3 doses as needed for chest pain. Susy Frizzle, MD Taking Active   Omega-3 Fatty Acids (FISH OIL PO) [id number] Yes Take 1 capsule by mouth at bedtime. [provider] Taking Active Multiple Informants  rosuvastatin (CRESTOR) 20 MG tablet [id number] Yes Take 1 tablet (20 mg total) by mouth daily. Susy Frizzle, MD Taking Active             Patient Active Problem List   Diagnosis Date Noted   CAD (coronary artery disease) 10/10/2017   Ischemic cardiomyopathy    Chronic combined systolic and diastolic heart failure ([place])    Hyperlipidemia LDL goal <70  Acute ST elevation myocardial infarction  (STEMI) involving left anterior descending (LAD) coronary artery ([place]) 10/07/2017   Collagenous colitis    History of right below knee amputation ([place]) 10/13/2013   Sinus tachycardia 06/04/2012   Anxiety state, unspecified 06/04/2012    Immunization History  Administered Date(s) Administered   Fluad Quad(high Dose 65+) 11/08/2018, 01/15/2021   Influenza, High Dose Seasonal PF 01/08/2017, 12/15/2017   Influenza,inj,Quad PF,6+ Mos 12/28/2012, 01/17/2014   Influenza-Unspecified 01/15/2016, 12/15/2017   PFIZER(Purple Top)SARS-COV-2 Vaccination 04/08/2019, 04/28/2019, 12/27/2019, 01/23/2020   Pneumococcal Conjugate-13 09/21/2014   Pneumococcal Polysaccharide-23 05/09/2002   Tdap 07/29/2012   Zoster Recombinat (Shingrix) 05/27/2017, 07/30/2017   Zoster, Live 07/07/2006    Conditions to be addressed/monitored:  Hyperlipidemia, Heart Failure, and Anxiety  Care Plan : General Pharmacy (Adult)  Updates made by Edythe Clarity, RPH since 03/15/2021 12:00 AM     Problem: Hyperlipidemia, Heart Failure, and Anxiety   Priority: High  Onset Date: 09/13/2020     Long-Range Goal: Patient-Specific Goal   Start Date: 09/13/2020  Expected End Date: 03/15/2021  Recent Progress: On track  Priority: High  Note:   Current Barriers:  None identified at this time  Pharmacist Clinical Goal(s):  Patient will achieve adherence to monitoring guidelines and medication adherence to achieve therapeutic efficacy maintain control of cholesterol and BP as evidenced by labs  adhere to prescribed medication regimen as evidenced by pill box usage contact provider office for questions/concerns as evidenced notation of same in electronic health record through collaboration with PharmD and provider.   Interventions: 1:1 collaboration with Susy Frizzle, MD regarding development and update of comprehensive plan of care as evidenced by provider attestation and co-signature Inter-disciplinary care team  collaboration (see longitudinal plan of care) Comprehensive medication review performed; medication list updated in electronic medical record  Hyperlipidemia: (LDL goal < 70) -Controlled -Current treatment: Rosuvastatin 74m daily -Medications previously tried: none noted  -Educated on Cholesterol goals;  Benefits of statin for ASCVD risk reduction; Importance of limiting foods high in cholesterol; -Most recent LDL well controlled -Counseled on diet and exercise extensively Recommended to continue current medication  Update 03/15/21 Continues to take statin as directed, no concerns with myalgias, Most recent LDL is controlled. Patient doing well overall, they have asked for an in person visit for him and his wife in 6 months for a general medication review. No changes to meds at this time.  Heart Failure (Goal: manage symptoms and prevent exacerbations) -Controlled -Last ejection fraction: 55%  -HF type: Combined Systolic and Diastolic -NYHA Class: II (slight limitation of activity) -Current treatment: Carvedilol 3.1283mtwice daily Losartan 2517mMedications previously tried: none noted  -Educated on Benefits of medications for managing symptoms and prolonging life Importance of weighing daily; if you gain more than 3 pounds in one day or 5 pounds in one week, contact providers Importance of blood pressure control -Does report some L leg swelling occasionally, however it resolves overnight when he lays down. -Counseled on diet and exercise extensively Recommended to continue current medication  Update 03/15/21 Patient denies any swelling.  He does not weight himself to monitor, however just just visually monitors to see if he is retaining fluids.  BP has been controlled at home, denies any SOB recently. No changes needed at this time continue current meds.  Anxiety (Goal: Control symptoms) -Controlled -Current treatment: Alprazolam 0.5mg26mn for sleep -Medications  previously tried/failed: none -GAD7: No flowsheet data found. -Educated on Benefits of medication for symptom control -Still takes  prn -Recommended to continue current medication  Patient Goals/Self-Care Activities Patient will:  - take medications as prescribed focus on medication adherence by pill box check blood pressure if symptomatic, document, and provide at future appointments  Follow Up Plan: The care management team will reach out to the patient again over the next 180 days.             Medication Assistance: None required.  Patient affirms current coverage meets needs.  Compliance/Adherence/Medication fill history: Care Gaps: Patient needs AWV  Preferred pharmacy is:  [id number] [place] [id number]: [phone]: [phone]  Uses pill box? Yes Pt endorses 100% compliance  We discussed: Benefits of medication synchronization, packaging and delivery as well as enhanced pharmacist oversight with [employer]. Patient decided to: Continue current medication management strategy  Care Plan and Follow Up Patient Decision:  Patient agrees to Care Plan and Follow-up.  Plan: The care management team will reach out to the patient again over the next 180 days.  CBeverly Milch PharmD Clinical Pharmacist [phone]

## 2021-03-06 ENCOUNTER — Telehealth: Payer: Self-pay

## 2021-03-06 NOTE — Telephone Encounter (Signed)
error 

## 2021-03-14 ENCOUNTER — Telehealth: Payer: Self-pay

## 2021-03-14 DIAGNOSIS — Z89512 Acquired absence of left leg below knee: Secondary | ICD-10-CM

## 2021-03-14 NOTE — Telephone Encounter (Signed)
Patient needs order for below knee prosthesis sleeve.  Order placed and sent to [facility].  Patient aware.

## 2021-03-15 ENCOUNTER — Other Ambulatory Visit: Payer: Self-pay

## 2021-03-15 ENCOUNTER — Ambulatory Visit (INDEPENDENT_AMBULATORY_CARE_PROVIDER_SITE_OTHER): Payer: PPO | Admitting: Pharmacist

## 2021-03-15 DIAGNOSIS — E785 Hyperlipidemia, unspecified: Secondary | ICD-10-CM

## 2021-03-15 DIAGNOSIS — I5042 Chronic combined systolic (congestive) and diastolic (congestive) heart failure: Secondary | ICD-10-CM

## 2021-03-15 MED ORDER — ROSUVASTATIN CALCIUM 20 MG PO TABS: 20.0000 mg | ORAL_TABLET | Freq: Every day | ORAL | 3 refills | Status: DC

## 2021-03-15 NOTE — Patient Instructions (Addendum)
Visit Information   Goals Addressed             This Visit's Progress    Track and Manage Fluids and Swelling-Heart Failure   On track    Timeframe:  Long-Range Goal Priority:  High Start Date:    09/13/20                         Expected End Date:  03/15/21                      Follow Up Date 12/14/20    - call office if I gain more than 2 pounds in one day or 5 pounds in one week - keep legs up while sitting - watch for swelling in feet, ankles and legs every day - weigh myself daily    Why is this important?   It is important to check your weight daily and watch how much salt and liquids you have.  It will help you to manage your heart failure.    Notes:        Patient Care Plan: General Pharmacy (Adult)     Problem Identified: Hyperlipidemia, Heart Failure, and Anxiety   Priority: High  Onset Date: 09/13/2020     Long-Range Goal: Patient-Specific Goal   Start Date: 09/13/2020  Expected End Date: 03/15/2021  Recent Progress: On track  Priority: High  Note:   Current Barriers:  None identified at this time  Pharmacist Clinical Goal(s):  Patient will achieve adherence to monitoring guidelines and medication adherence to achieve therapeutic efficacy maintain control of cholesterol and BP as evidenced by labs  adhere to prescribed medication regimen as evidenced by pill box usage contact provider office for questions/concerns as evidenced notation of same in electronic health record through collaboration with PharmD and provider.   Interventions: 1:1 collaboration with Susy Frizzle, MD regarding development and update of comprehensive plan of care as evidenced by provider attestation and co-signature Inter-disciplinary care team collaboration (see longitudinal plan of care) Comprehensive medication review performed; medication list updated in electronic medical record  Hyperlipidemia: (LDL goal < 70) -Controlled -Current treatment: Rosuvastatin 20mg   daily -Medications previously tried: none noted  -Educated on Cholesterol goals;  Benefits of statin for ASCVD risk reduction; Importance of limiting foods high in cholesterol; -Most recent LDL well controlled -Counseled on diet and exercise extensively Recommended to continue current medication  Update 03/15/21 Continues to take statin as directed, no concerns with myalgias, Most recent LDL is controlled. Patient doing well overall, they have asked for an in person visit for him and his wife in 6 months for a general medication review. No changes to meds at this time.  Heart Failure (Goal: manage symptoms and prevent exacerbations) -Controlled -Last ejection fraction: 55%  -HF type: Combined Systolic and Diastolic -NYHA Class: II (slight limitation of activity) -Current treatment: Carvedilol 3.125mg  twice daily Losartan 25mg  -Medications previously tried: none noted  -Educated on Benefits of medications for managing symptoms and prolonging life Importance of weighing daily; if you gain more than 3 pounds in one day or 5 pounds in one week, contact providers Importance of blood pressure control -Does report some L leg swelling occasionally, however it resolves overnight when he lays down. -Counseled on diet and exercise extensively Recommended to continue current medication  Update 03/15/21 Patient denies any swelling.  He does not weight himself to monitor, however just just visually monitors to see if he is  retaining fluids.  BP has been controlled at home, denies any SOB recently. No changes needed at this time continue current meds.  Anxiety (Goal: Control symptoms) -Controlled -Current treatment: Alprazolam 0.5mg  prn for sleep -Medications previously tried/failed: none -GAD7: No flowsheet data found. -Educated on Benefits of medication for symptom control -Still takes prn -Recommended to continue current medication  Patient Goals/Self-Care Activities Patient will:   - take medications as prescribed focus on medication adherence by pill box check blood pressure if symptomatic, document, and provide at future appointments  Follow Up Plan: The care management team will reach out to the patient again over the next 180 days.           Patient verbalizes understanding of instructions provided today and agrees to view in [place].  Telephone follow up appointment with pharmacy team member scheduled for: 6 months in person  Edythe Clarity, [place], PharmD, [place] Clinical Pharmacist Practitioner [place] [phone]

## 2021-03-16 DIAGNOSIS — I5042 Chronic combined systolic (congestive) and diastolic (congestive) heart failure: Secondary | ICD-10-CM

## 2021-03-16 DIAGNOSIS — F411 Generalized anxiety disorder: Secondary | ICD-10-CM

## 2021-03-16 DIAGNOSIS — E785 Hyperlipidemia, unspecified: Secondary | ICD-10-CM | POA: Diagnosis not present

## 2021-03-26 DIAGNOSIS — Z89511 Acquired absence of right leg below knee: Secondary | ICD-10-CM | POA: Diagnosis not present

## 2021-04-05 ENCOUNTER — Emergency Department (HOSPITAL_COMMUNITY): Payer: PPO

## 2021-04-05 ENCOUNTER — Ambulatory Visit (INDEPENDENT_AMBULATORY_CARE_PROVIDER_SITE_OTHER): Payer: PPO | Admitting: Family Medicine

## 2021-04-05 ENCOUNTER — Observation Stay (HOSPITAL_COMMUNITY)
Admission: EM | Admit: 2021-04-05 | Discharge: 2021-04-06 | Disposition: A | Discharge status: Home / Self Care | Payer: PPO | Attending: Internal Medicine | Admitting: Internal Medicine

## 2021-04-05 ENCOUNTER — Other Ambulatory Visit: Payer: Self-pay

## 2021-04-05 ENCOUNTER — Encounter: Payer: Self-pay | Admitting: Family Medicine

## 2021-04-05 VITALS — BP 124/62 | HR 71 | Temp 98.2°F | Resp 18 | Ht 70.0 in | Wt 153.0 lb

## 2021-04-05 DIAGNOSIS — R4182 Altered mental status, unspecified: Secondary | ICD-10-CM | POA: Diagnosis not present

## 2021-04-05 DIAGNOSIS — I5042 Chronic combined systolic (congestive) and diastolic (congestive) heart failure: Secondary | ICD-10-CM | POA: Diagnosis present

## 2021-04-05 DIAGNOSIS — G546 Phantom limb syndrome with pain: Secondary | ICD-10-CM

## 2021-04-05 DIAGNOSIS — G459 Transient cerebral ischemic attack, unspecified: Secondary | ICD-10-CM | POA: Diagnosis not present

## 2021-04-05 DIAGNOSIS — I2510 Atherosclerotic heart disease of native coronary artery without angina pectoris: Secondary | ICD-10-CM | POA: Diagnosis not present

## 2021-04-05 DIAGNOSIS — I6503 Occlusion and stenosis of bilateral vertebral arteries: Secondary | ICD-10-CM | POA: Diagnosis not present

## 2021-04-05 DIAGNOSIS — R55 Syncope and collapse: Secondary | ICD-10-CM | POA: Diagnosis not present

## 2021-04-05 DIAGNOSIS — M6281 Muscle weakness (generalized): Secondary | ICD-10-CM | POA: Diagnosis not present

## 2021-04-05 DIAGNOSIS — U071 COVID-19: Secondary | ICD-10-CM | POA: Diagnosis not present

## 2021-04-05 DIAGNOSIS — I10 Essential (primary) hypertension: Secondary | ICD-10-CM | POA: Diagnosis not present

## 2021-04-05 DIAGNOSIS — Z87891 Personal history of nicotine dependence: Secondary | ICD-10-CM | POA: Diagnosis not present

## 2021-04-05 DIAGNOSIS — Z96659 Presence of unspecified artificial knee joint: Secondary | ICD-10-CM | POA: Diagnosis not present

## 2021-04-05 DIAGNOSIS — Z79899 Other long term (current) drug therapy: Secondary | ICD-10-CM | POA: Diagnosis not present

## 2021-04-05 DIAGNOSIS — Z7982 Long term (current) use of aspirin: Secondary | ICD-10-CM | POA: Diagnosis not present

## 2021-04-05 DIAGNOSIS — Z85828 Personal history of other malignant neoplasm of skin: Secondary | ICD-10-CM | POA: Diagnosis not present

## 2021-04-05 DIAGNOSIS — I255 Ischemic cardiomyopathy: Secondary | ICD-10-CM | POA: Diagnosis present

## 2021-04-05 DIAGNOSIS — J9811 Atelectasis: Secondary | ICD-10-CM | POA: Diagnosis not present

## 2021-04-05 DIAGNOSIS — R4020 Unspecified coma: Secondary | ICD-10-CM | POA: Diagnosis not present

## 2021-04-05 DIAGNOSIS — E785 Hyperlipidemia, unspecified: Secondary | ICD-10-CM | POA: Diagnosis present

## 2021-04-05 DIAGNOSIS — R531 Weakness: Secondary | ICD-10-CM | POA: Diagnosis not present

## 2021-04-05 DIAGNOSIS — R29818 Other symptoms and signs involving the nervous system: Secondary | ICD-10-CM | POA: Diagnosis not present

## 2021-04-05 DIAGNOSIS — I6523 Occlusion and stenosis of bilateral carotid arteries: Secondary | ICD-10-CM | POA: Diagnosis not present

## 2021-04-05 LAB — DIFFERENTIAL
Abs Immature Granulocytes: 0.05 10*3/uL (ref 0.00–0.07)
Basophils Absolute: 0 10*3/uL (ref 0.0–0.1)
Basophils Relative: 0 %
Eosinophils Absolute: 0 10*3/uL (ref 0.0–0.5)
Eosinophils Relative: 0 %
Immature Granulocytes: 1 %
Lymphocytes Relative: 23 %
Lymphs Abs: 1.6 10*3/uL (ref 0.7–4.0)
Monocytes Absolute: 0.9 10*3/uL (ref 0.1–1.0)
Monocytes Relative: 13 %
Neutro Abs: 4.2 10*3/uL (ref 1.7–7.7)
Neutrophils Relative %: 63 %

## 2021-04-05 LAB — COMPREHENSIVE METABOLIC PANEL
ALT: 25 U/L (ref 0–44)
AST: 33 U/L (ref 15–41)
Albumin: 3.2 g/dL — ABNORMAL LOW (ref 3.5–5.0)
Alkaline Phosphatase: 62 U/L (ref 38–126)
Anion gap: 11 (ref 5–15)
BUN: 12 mg/dL (ref 8–23)
CO2: 22 mmol/L (ref 22–32)
Calcium: 8.5 mg/dL — ABNORMAL LOW (ref 8.9–10.3)
Chloride: 100 mmol/L (ref 98–111)
Creatinine, Ser: 0.95 mg/dL (ref 0.61–1.24)
GFR, Estimated: >60 mL/min (ref >60)
Glucose, Bld: 92 mg/dL (ref 70–99)
Potassium: 3.6 mmol/L (ref 3.5–5.1)
Sodium: 133 mmol/L — ABNORMAL LOW (ref 135–145)
Total Bilirubin: 0.6 mg/dL (ref 0.3–1.2)
Total Protein: 5.6 g/dL — ABNORMAL LOW (ref 6.5–8.1)

## 2021-04-05 LAB — URINALYSIS, ROUTINE W REFLEX MICROSCOPIC
Bilirubin Urine: NEGATIVE
Glucose, UA: NEGATIVE mg/dL
Hgb urine dipstick: NEGATIVE
Ketones, ur: NEGATIVE mg/dL
Leukocytes,Ua: NEGATIVE
Nitrite: NEGATIVE
Protein, ur: NEGATIVE mg/dL
Specific Gravity, Urine: 1.018 (ref 1.005–1.030)
pH: 5 (ref 5.0–8.0)

## 2021-04-05 LAB — CBC
HCT: 44 % (ref 39.0–52.0)
Hemoglobin: 14.4 g/dL (ref 13.0–17.0)
MCH: 33.5 pg (ref 26.0–34.0)
MCHC: 32.7 g/dL (ref 30.0–36.0)
MCV: 102.3 fL — ABNORMAL HIGH (ref 80.0–100.0)
Platelets: 142 10*3/uL — ABNORMAL LOW (ref 150–400)
RBC: 4.3 MIL/uL (ref 4.22–5.81)
RDW: 12.4 % (ref 11.5–15.5)
WBC: 6.8 10*3/uL (ref 4.0–10.5)
nRBC: 0 % (ref 0.0–0.2)

## 2021-04-05 LAB — CBG MONITORING, ED: Glucose-Capillary: 94 mg/dL (ref 70–99)

## 2021-04-05 LAB — I-STAT CHEM 8, ED
BUN: 12 mg/dL (ref 8–23)
Calcium, Ion: 0.94 mmol/L — ABNORMAL LOW (ref 1.15–1.40)
Chloride: 101 mmol/L (ref 98–111)
Creatinine, Ser: 0.9 mg/dL (ref 0.61–1.24)
Glucose, Bld: 91 mg/dL (ref 70–99)
HCT: 42 % (ref 39.0–52.0)
Hemoglobin: 14.3 g/dL (ref 13.0–17.0)
Potassium: 3.6 mmol/L (ref 3.5–5.1)
Sodium: 133 mmol/L — ABNORMAL LOW (ref 135–145)
TCO2: 23 mmol/L (ref 22–32)

## 2021-04-05 LAB — RESP PANEL BY RT-PCR (FLU A&B, COVID) ARPGX2
Influenza A by PCR: NEGATIVE
Influenza B by PCR: NEGATIVE
SARS Coronavirus 2 by RT PCR: POSITIVE — AB

## 2021-04-05 LAB — PROTIME-INR
INR: 1 (ref 0.8–1.2)
Prothrombin Time: 13.4 seconds (ref 11.4–15.2)

## 2021-04-05 LAB — APTT: aPTT: 32 seconds (ref 24–36)

## 2021-04-05 MED ORDER — ACETAMINOPHEN 650 MG RE SUPP: 650.0000 mg | RECTAL | Status: DC | PRN

## 2021-04-05 MED ORDER — ASPIRIN 81 MG PO CHEW: 81.0000 mg | CHEWABLE_TABLET | Freq: Every day | ORAL | Status: DC

## 2021-04-05 MED ORDER — STROKE: EARLY STAGES OF RECOVERY BOOK
Freq: Once | Status: AC
  Filled 2021-04-05: qty 1

## 2021-04-05 MED ORDER — ACETAMINOPHEN 325 MG PO TABS
650.0000 mg | ORAL_TABLET | Freq: Once | ORAL | Status: AC
  Administered 2021-04-06: 650 mg via ORAL
  Filled 2021-04-05: qty 2

## 2021-04-05 MED ORDER — CHOLESTYRAMINE 4 G PO PACK: 4.0000 g | PACK | Freq: Every day | ORAL | Status: DC | PRN

## 2021-04-05 MED ORDER — ACETAMINOPHEN 325 MG PO TABS: 650.0000 mg | ORAL_TABLET | ORAL | Status: DC | PRN

## 2021-04-05 MED ORDER — GABAPENTIN 100 MG PO CAPS: 100.0000 mg | ORAL_CAPSULE | Freq: Three times a day (TID) | ORAL | Status: DC | PRN

## 2021-04-05 MED ORDER — IOHEXOL 350 MG/ML SOLN
75.0000 mL | Freq: Once | INTRAVENOUS | Status: AC | PRN
Start: 2021-04-05 — End: 2021-04-05
  Administered 2021-04-05: 75 mL via INTRAVENOUS

## 2021-04-05 MED ORDER — MOLNUPIRAVIR EUA 200MG CAPSULE
4.0000 | ORAL_CAPSULE | Freq: Two times a day (BID) | ORAL | Status: DC
Start: 2021-04-06 — End: 2021-04-06
  Filled 2021-04-05: qty 4

## 2021-04-05 MED ORDER — SODIUM CHLORIDE 0.9% FLUSH: 3.0000 mL | Freq: Once | INTRAVENOUS | Status: DC

## 2021-04-05 MED ORDER — ACETAMINOPHEN 160 MG/5ML PO SOLN: 650.0000 mg | ORAL | Status: DC | PRN

## 2021-04-05 MED ORDER — ALPRAZOLAM 0.5 MG PO TABS: 0.5000 mg | ORAL_TABLET | Freq: Every evening | ORAL | Status: DC | PRN

## 2021-04-05 MED ORDER — BUDESONIDE 3 MG PO CPEP
6.0000 mg | ORAL_CAPSULE | Freq: Every morning | ORAL | Status: DC
  Administered 2021-04-06: 6 mg via ORAL
  Filled 2021-04-05: qty 2

## 2021-04-05 MED ORDER — GABAPENTIN 100 MG PO CAPS: 100.0000 mg | ORAL_CAPSULE | Freq: Three times a day (TID) | ORAL | 3 refills | Status: DC | PRN

## 2021-04-05 MED ORDER — ROSUVASTATIN CALCIUM 20 MG PO TABS: 20.0000 mg | ORAL_TABLET | Freq: Every morning | ORAL | Status: DC

## 2021-04-05 MED ORDER — CARVEDILOL 3.125 MG PO TABS
3.1250 mg | ORAL_TABLET | Freq: Two times a day (BID) | ORAL | Status: DC
  Administered 2021-04-06: 3.125 mg via ORAL
  Filled 2021-04-05: qty 1

## 2021-04-05 MED ORDER — SODIUM CHLORIDE 0.9 % IV SOLN: INTRAVENOUS | Status: DC

## 2021-04-05 MED ORDER — ENOXAPARIN SODIUM 40 MG/0.4ML IJ SOSY
40.0000 mg | PREFILLED_SYRINGE | INTRAMUSCULAR | Status: DC
  Administered 2021-04-06: 40 mg via SUBCUTANEOUS
  Filled 2021-04-05: qty 0.4

## 2021-04-05 NOTE — Consult Note (Signed)
Neurology Consultation Reason for Consult: Left-sided weakness Referring Physician: Tegeler, C  CC: Left-sided weakness  History is obtained from: Patient, wife, EMS  HPI: Eugene [name] is a 88 y.o. male with a history of BKA on the right, hyperlipidemia, ischemic cardiomyopathy who presents with transient episode of left-sided weakness.  The patient denies any loss of consciousness, states that he just suddenly could not get up and then he slid out of his chair and ended up on the ground.  He states that he is not certain why he was unable to get up, denies any focal weakness.  His wife initially reported that she was unable to wake him up for about 30 to 40 seconds, but when I asked her about this she is uncertain.  The patient does not feel that he ever lost consciousness.  His niece arrived about 5 minutes after the event, and found him on the ground, trying to get up.  Once EMS arrived and they stood him up, it was noted that he had significant left-sided weakness.  Prior to transfer him to the stretcher, however, his deficits rapidly improved.   LKW: 9390 tpa given?: no, outside window   ROS: A 14 point ROS was performed and is negative except as noted in the HPI.   Past Medical History:  Diagnosis Date   Amputated right leg ([place])    CAD (coronary artery disease)    a. 09/2017 - acute MI -  emergent cath showing 99% stenosis in midLAD with thrombotic stenosis at the bifurcation of the second diagonal, which was relatively small in diameter. He underwent placement of DES to midLAD which jailed the second diagonal.    Chronic combined systolic and diastolic heart failure (HCC)    Collagenous colitis    Dr. Festus Holts   History of ST elevation myocardial infarction (STEMI)    09/2017: LAD   History of stomach ulcers    Hyperlipidemia LDL goal <70    Ischemic cardiomyopathy    Squamous cell carcinoma of skin 03/27/2014   in situ-left temple (txpbx)   Squamous cell carcinoma of skin  02/24/2018   in situ-left cheek (CX35FU)     Family History  Problem Relation Age of Onset   Diabetes Sister    Diabetes Brother    Diabetes Sister    Cancer Sister        breast     Social History:  reports that he has quit smoking. His smoking use included cigarettes. He has a 8.00 pack-year smoking history. He has never used smokeless tobacco. He reports current alcohol use. He reports that he does not use drugs.   Exam: Current vital signs: BP (!) 161/82    Pulse 87    Temp (!) 101.4 F (38.6 C) (Oral)    Resp 18    SpO2 94%  Vital signs in last 24 hours: Temp:  [98.2 F (36.8 C)-101.4 F (38.6 C)] 101.4 F (38.6 C) (01/20 1947) Pulse Rate:  [71-87] 87 (01/20 1945) Resp:  [18] 18 (01/20 1945) BP: (124-161)/(62-82) 161/82 (01/20 1945) SpO2:  [94 %-96 %] 94 % (01/20 1945) Weight:  [69.4 kg] 69.4 kg (01/20 1057)   Physical Exam  Constitutional: Appears well-developed and well-nourished.  Psych: Affect appropriate to situation Eyes: No scleral injection HENT: No OP obstruction MSK: no joint deformities.  Cardiovascular: Normal rate and regular rhythm.  Respiratory: Effort normal, non-labored breathing GI: Soft.  No distension. There is no tenderness.  Skin: WDI  Neuro: Mental Status:  Patient is awake, alert, oriented to person, place, month, year, and situation. Patient is able to give a clear and coherent history. No signs of aphasia or neglect Cranial Nerves: II: Visual Fields are full. Pupils are equal, round, and reactive to light.   III,IV, VI: EOMI without ptosis or diploplia.  V: Facial sensation is symmetric to temperature VII: Facial movement is symmetric.  VIII: hearing is intact to voice X: Uvula elevates symmetrically XI: Shoulder shrug is symmetric. XII: tongue is midline without atrophy or fasciculations.  Motor: Tone is normal. Bulk is normal. 5/5 strength was present in all four extremities.  Sensory: Sensation is symmetric to light touch  and temperature in the arms and legs. Cerebellar: No ataxia   I have reviewed labs in epic and the results pertinent to this consultation are: NA 133 Cr 0.9  I have reviewed the images obtained: CT head - negative  Impression: 88 year old male with transient left-sided weakness.  He was not aware of his deficits, making me suspect that there was a component of neglect.  He currently feels back to his normal self except for his gait, which is still more unsteady than typical.  Of note, he was also found to be febrile, but no headache or other signs of CNS infection.  My suspicion is represents transient ischemic attack, given there was a question of loss of consciousness I think an EEG would also be reasonable but I think that seizure would be much less likely.     Recommendations: - HgbA1c, fasting lipid panel - MRI of the brain without contrast - Frequent neuro checks - Echocardiogram - CTA head and neck - Prophylactic therapy-Antiplatelet med: Aspirin - dose 325 mg daily - Risk factor modification - Telemetry monitoring - PT consult, OT consult, Speech consult -EEG - Stroke team to follow    Roland Rack, MD [facility] [phone]  If 7pm- 7am, please page neurology on call as listed in [place].

## 2021-04-05 NOTE — Progress Notes (Signed)
Subjective:    Patient ID: Eugene [name], male    DOB: Nov 27, 1931, 88 y.o.   MRN: [id number]  HPI Patient has a longstanding history of a right below the knee amputation.  He suffered this from a traumatic injury while cutting tree years ago.  He reports having phantom limb pain.  We have for having pain in his lower leg that is nonexistent.  Pain has become more frequent with sleeping and weight at night.  There is no rash on his right leg.  There is no pain in his right lower back.  Pain is isolated to his calf and his shin on his lower leg area which again was removed in the past. Past Medical History:  Diagnosis Date   Amputated right leg (HCC)    CAD (coronary artery disease)    a. 09/2017 - acute MI -  emergent cath showing 99% stenosis in midLAD with thrombotic stenosis at the bifurcation of the second diagonal, which was relatively small in diameter. He underwent placement of DES to midLAD which jailed the second diagonal.    Chronic combined systolic and diastolic heart failure ([place])    Collagenous colitis    Dr. Festus Holts   History of ST elevation myocardial infarction (STEMI)    09/2017: LAD   History of stomach ulcers    Hyperlipidemia LDL goal <70    Ischemic cardiomyopathy    Squamous cell carcinoma of skin 03/27/2014   in situ-left temple (txpbx)   Squamous cell carcinoma of skin 02/24/2018   in situ-left cheek (CX35FU)   Past Surgical History:  Procedure Laterality Date   BACK SURGERY     CARDIAC CATHETERIZATION     CORONARY STENT INTERVENTION N/A 10/07/2017   Procedure: CORONARY STENT INTERVENTION;  Surgeon: Wellington Hampshire, MD;  Location: [facility];  Service: Cardiovascular;  Laterality: N/A;   CORONARY/GRAFT ACUTE MI REVASCULARIZATION N/A 10/07/2017   Procedure: Coronary/Graft Acute MI Revascularization;  Surgeon: Wellington Hampshire, MD;  Location: [facility];  Service: Cardiovascular;  Laterality: N/A;   LEFT HEART CATH AND CORONARY ANGIOGRAPHY N/A  10/07/2017   Procedure: LEFT HEART CATH AND CORONARY ANGIOGRAPHY;  Surgeon: Wellington Hampshire, MD;  Location: [facility];  Service: Cardiovascular;  Laterality: N/A;   LEG AMPUTATION     Right Below knee   STOMACH SURGERY     [place] II gastrojejunostomy   TOTAL KNEE ARTHROPLASTY     Current Outpatient Medications on File Prior to Visit  Medication Sig Dispense Refill   ALPRAZolam (XANAX) 0.5 MG tablet Take 1 tablet (0.5 mg total) by mouth at bedtime as needed. for sleep 30 tablet 0   aspirin 81 MG tablet Take 81 mg by mouth at bedtime.     budesonide (ENTOCORT EC) 3 MG 24 hr capsule Take 9 mg by mouth every morning.     carvedilol (COREG) 3.125 MG tablet TAKE 1 TABLET BY MOUTH TWICE A DAY WITH MEALS 180 tablet 1   cholestyramine (QUESTRAN) 4 GM/DOSE powder USE 1 SCOOP 3 TIMES A DAY WITH A MEAL 756 g 6   fluorouracil (EFUDEX) 5 % cream SMARTSIG:Sparingly Topical Twice Daily     losartan (COZAAR) 25 MG tablet TAKE 1 TABLET BY MOUTH DAILY. PLEASE KEEP UPCOMING APPT FOR FUTURE REFILLS THANK YOU 30 tablet 11   Multiple Vitamins-Minerals (PRESERVISION AREDS PO) Take 1 tablet by mouth 2 (two) times daily.     nitroGLYCERIN (NITROSTAT) 0.4 MG SL tablet Place 1 tablet (  0.4 mg total) under the tongue every 5 (five) minutes x 3 doses as needed for chest pain. 25 tablet 3   Omega-3 Fatty Acids (FISH OIL PO) Take 1 capsule by mouth at bedtime.     rosuvastatin (CRESTOR) 20 MG tablet Take 1 tablet (20 mg total) by mouth daily. 90 tablet 3   No current facility-administered medications on file prior to visit.   Allergies  Allergen Reactions   Aleve [Naproxen Sodium] Nausea And Vomiting    No problem with other NSAIDs   Amoxicillin Other (See Comments)    Did it involve swelling of the face/tongue/throat, SOB, or low BP? No Did it involve sudden or severe rash/hives, skin peeling, or any reaction on the inside of your mouth or nose? No Did you need to seek medical attention at a hospital or  doctor's office? unknown When did it last happen?   yrs ago    dizziness If all above answers are NO, may proceed with cephalosporin use.    Morphine And Related     dizzy   Penicillins     Diarrhea Did it involve swelling of the face/tongue/throat, SOB, or low BP? No Did it involve sudden or severe rash/hives, skin peeling, or any reaction on the inside of your mouth or nose? No Did you need to seek medical attention at a hospital or doctor's office? Unknown When did it last happen?    years ago   If all above answers are NO, may proceed with cephalosporin use.    Social History   Socioeconomic History   Marital status: Married    Spouse name: Financial risk analyst   Number of children: Not on file   Years of education: 12   Highest education level: High school graduate  Occupational History   Occupation: Retired  Tobacco Use   Smoking status: Former    Packs/day: 1.00    Years: 8.00    Pack years: 8.00    Types: Cigarettes   Smokeless tobacco: Never  Vaping Use   Vaping Use: Never used  Substance and Sexual Activity   Alcohol use: Yes    Comment: occasional   Drug use: No   Sexual activity: Yes    Comment: married  Other Topics Concern   Not on file  Social History Narrative   Not on file   Social Determinants of Health   Financial Resource Strain: Low Risk    Difficulty of Paying Living Expenses: Not hard at all  Food Insecurity: No Food Insecurity   Worried About Charity fundraiser in the Last Year: Never true   Arboriculturist in the Last Year: Never true  Transportation Needs: No Transportation Needs   Lack of Transportation (Medical): No   Lack of Transportation (Non-Medical): No  Physical Activity: Inactive   Days of Exercise per Week: 0 days   Minutes of Exercise per Session: 0 min  Stress: No Stress Concern Present   Feeling of Stress : Not at all  Social Connections: Moderately Integrated   Frequency of Communication with Friends and Family: More than  three times a week   Frequency of Social Gatherings with Friends and Family: More than three times a week   Attends Religious Services: More than 4 times per year   Active Member of [employer] or Organizations: No   Attends Archivist Meetings: Never   Marital Status: Married  Human resources officer Violence: Not At Risk   Fear of Current or Ex-Partner: No  Emotionally Abused: No   Physically Abused: No   Sexually Abused: No   Review of Systems  All other systems reviewed and are negative.     Objective:   Physical Exam Vitals reviewed.  Cardiovascular:     Rate and Rhythm: Normal rate and regular rhythm.     Heart sounds: Normal heart sounds.  Pulmonary:     Effort: Pulmonary effort is normal. No respiratory distress.     Breath sounds: Normal breath sounds. No wheezing or rales.   Right below the knee amputation. Please see history of present illness for further information       Assessment & Plan:  Phantom limb pain ([place]) We will try gabapentin 100 mg p.o. 3 times daily as needed leg pain.  Gradually increase slowly as tolerated if necessary.

## 2021-04-05 NOTE — ED Notes (Signed)
Pt ongoing EEG at this time - will resume [facility] when finished

## 2021-04-05 NOTE — ED Notes (Signed)
EEG at bedside.

## 2021-04-05 NOTE — H&P (Addendum)
History and Physical    Eugene [name] ZOX:[id number] DOB: February 06, 1932 DOA: 04/05/2021  PCP: Susy Frizzle, MD  Patient coming from: Home  I have personally briefly reviewed patient's old medical records in [place]  Chief Complaint: L sided weakness  HPI: Eugene [name] is a 88 y.o. 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  Pt presents to ED with transient episode of L sided weakness.  No LOC.  States that he suddenly couldn't get up and slid out of chair and ended up on ground.  The patient does not feel that he ever lost consciousness.  His niece arrived about 5 minutes after the event, and found him on the ground, trying to get up.  Once EMS arrived and they stood him up, it was noted that he had significant left-sided weakness.  Prior to transfer him to the stretcher, however, his deficits rapidly improved.   ED Course: Tm 101.4; COVID+   Past Medical History:  Diagnosis Date   Amputated right leg ([place])    CAD (coronary artery disease)    a. 09/2017 - acute MI -  emergent cath showing 99% stenosis in midLAD with thrombotic stenosis at the bifurcation of the second diagonal, which was relatively small in diameter. He underwent placement of DES to midLAD which jailed the second diagonal.    Chronic combined systolic and diastolic heart failure ([place])    Collagenous colitis    Dr. Festus Holts   History of ST elevation myocardial infarction (STEMI)    09/2017: LAD   History of stomach ulcers    Hyperlipidemia LDL goal <70    Ischemic cardiomyopathy    Squamous cell carcinoma of skin 03/27/2014   in situ-left temple (txpbx)   Squamous cell carcinoma of skin 02/24/2018   in situ-left cheek ([id number])    Past Surgical History:  Procedure Laterality Date   BACK SURGERY     CARDIAC CATHETERIZATION     CORONARY STENT INTERVENTION N/A 10/07/2017   Procedure: CORONARY STENT INTERVENTION;  Surgeon:  Wellington Hampshire, MD;  Location: [facility];  Service: Cardiovascular;  Laterality: N/A;   CORONARY/GRAFT ACUTE MI REVASCULARIZATION N/A 10/07/2017   Procedure: Coronary/Graft Acute MI Revascularization;  Surgeon: Wellington Hampshire, MD;  Location: [facility];  Service: Cardiovascular;  Laterality: N/A;   LEFT HEART CATH AND CORONARY ANGIOGRAPHY N/A 10/07/2017   Procedure: LEFT HEART CATH AND CORONARY ANGIOGRAPHY;  Surgeon: Wellington Hampshire, MD;  Location: [facility];  Service: Cardiovascular;  Laterality: N/A;   LEG AMPUTATION     Right Below knee   STOMACH SURGERY     Billroth II gastrojejunostomy   TOTAL KNEE ARTHROPLASTY       reports that he has quit smoking. His smoking use included cigarettes. He has a 8.00 pack-year smoking history. He has never used smokeless tobacco. He reports current alcohol use. He reports that he does not use drugs.  Allergies  Allergen Reactions   Aleve [Naproxen Sodium] Nausea And Vomiting and Other (See Comments)    No problem with other NSAIDs   Amoxicillin Other (See Comments)    Vertigo Did it involve swelling of the face/tongue/throat, SOB, or low BP? No Did it involve sudden or severe rash/hives, skin peeling, or any reaction on the inside of your mouth or nose? No Did you need to seek medical attention at a hospital or doctor's office? unknown When did it  last happen?   yrs ago    dizziness If all above answers are "NO", may proceed with cephalosporin use.    Gabapentin Other (See Comments)    Might be sensitive to this- took 100 mg twice within a couple of hours = Ended up falling from a standing position and had to be taken to the E.D.   Morphine And Related Other (See Comments)    Dizziness    Penicillins Diarrhea and Other (See Comments)    Diarrhea Did it involve swelling of the face/tongue/throat, SOB, or low BP? No Did it involve sudden or severe rash/hives, skin peeling, or any reaction on the inside of your  mouth or nose? No Did you need to seek medical attention at a hospital or doctor's office? Unknown When did it last happen?    years ago   If all above answers are "NO", may proceed with cephalosporin use.     Family History  Problem Relation Age of Onset   Diabetes Sister    Diabetes Brother    Diabetes Sister    Cancer Sister        breast    Prior to Admission medications   Medication Sig Start Date End Date Taking? Authorizing Provider  ALPRAZolam Duanne Moron) 0.5 MG tablet Take 1 tablet (0.5 mg total) by mouth at bedtime as needed. for sleep Patient taking differently: Take 0.5 mg by mouth at bedtime as needed for sleep. 08/16/20  Yes Susy Frizzle, MD  aspirin 81 MG tablet Take 81 mg by mouth daily.   Yes [provider]  budesonide (ENTOCORT EC) 3 MG 24 hr capsule Take 6 mg by mouth every morning. 02/25/21  Yes [provider]  carvedilol (COREG) 3.125 MG tablet TAKE 1 TABLET BY MOUTH TWICE A DAY WITH MEALS Patient taking differently: Take 3.125 mg by mouth in the morning and at bedtime. 09/27/20  Yes Burnell Blanks, MD  cholestyramine Lucrezia Starch) 4 GM/DOSE powder USE 1 SCOOP 3 TIMES A DAY WITH A MEAL Patient taking differently: Take 4 g by mouth daily as needed (for colitis flares (mix and drink)). 04/07/19  Yes Susy Frizzle, MD  fluorouracil (EFUDEX) 5 % cream Apply 1 application topically daily as needed (as directed- affected sites). 07/19/20  Yes [provider]  gabapentin (NEURONTIN) 100 MG capsule Take 1 capsule (100 mg total) by mouth 3 (three) times daily as needed (nerve pain). 04/05/21  Yes Susy Frizzle, MD  losartan (COZAAR) 25 MG tablet TAKE 1 TABLET BY MOUTH DAILY. PLEASE KEEP UPCOMING APPT FOR FUTURE REFILLS [place] YOU Patient taking differently: Take 25 mg by mouth in the morning. 01/22/21  Yes Burnell Blanks, MD  Multiple Vitamins-Minerals (PRESERVISION AREDS PO) Take 1 capsule by mouth 2 (two) times daily.   Yes  [provider]  nitroGLYCERIN (NITROSTAT) 0.4 MG SL tablet Place 1 tablet (0.4 mg total) under the tongue every 5 (five) minutes x 3 doses as needed for chest pain. 02/15/20  Yes Susy Frizzle, MD  Omega-3 Fatty Acids ([employer] FISH OIL) 1200 MG CAPS Take 1,200 mg by mouth daily with breakfast.   Yes [provider]  rosuvastatin (CRESTOR) 20 MG tablet Take 1 tablet (20 mg total) by mouth daily. Patient taking differently: Take 20 mg by mouth in the morning. 03/15/21  Yes Susy Frizzle, MD    Physical Exam: Vitals:   04/05/21 2100 04/05/21 2200 04/05/21 2230 04/05/21 2307  BP: (!) 149/73 (!) 143/56  Pulse: 81 84 90   Resp: 18 (!) 28 (!) 23   Temp:    (!) 101.4 F (38.6 C)  TempSrc:    Oral  SpO2: 94% 92% 95%     Constitutional: NAD, calm, comfortable Eyes: PERRL, lids and conjunctivae normal ENMT: Mucous membranes are moist. Posterior pharynx clear of any exudate or lesions.Normal dentition.  Neck: normal, supple, no masses, no thyromegaly Respiratory: clear to auscultation bilaterally, no wheezing, no crackles. Normal respiratory effort. No accessory muscle use.  Cardiovascular: Regular rate and rhythm, no murmurs / rubs / gallops. No extremity edema. 2+ pedal pulses. No carotid bruits.  Abdomen: no tenderness, no masses palpated. No hepatosplenomegaly. Bowel sounds positive.  Musculoskeletal: no clubbing / cyanosis. No joint deformity upper and lower extremities. Good ROM, no contractures. Normal muscle tone.  Skin: no rashes, lesions, ulcers. No induration Neurologic: CN 2-12 grossly intact. Sensation intact, DTR normal. Strength 5/5 in all 4.  Psychiatric: Normal judgment and insight. Alert and oriented x 3. Normal mood.    Labs on Admission: I have personally reviewed following labs and imaging studies  CBC: Recent Labs  Lab 04/05/21 1921 04/05/21 1926  WBC 6.8  --   NEUTROABS 4.2  --   HGB 14.4 14.3  HCT 44.0 42.0  MCV 102.3*  --   PLT 142*  --     Basic Metabolic Panel: Recent Labs  Lab 04/05/21 1921 04/05/21 1926  NA 133* 133*  K 3.6 3.6  CL 100 101  CO2 22  --   GLUCOSE 92 91  BUN 12 12  CREATININE 0.95 0.90  CALCIUM 8.5*  --    GFR: Estimated Creatinine Clearance: 55.7 mL/min (by C-G formula based on SCr of 0.9 mg/dL). Liver Function Tests: Recent Labs  Lab 04/05/21 1921  AST 33  ALT 25  ALKPHOS 62  BILITOT 0.6  PROT 5.6*  ALBUMIN 3.2*   No results for input(s): LIPASE, AMYLASE in the last 168 hours. No results for input(s): AMMONIA in the last 168 hours. Coagulation Profile: Recent Labs  Lab 04/05/21 1921  INR 1.0   Cardiac Enzymes: No results for input(s): CKTOTAL, CKMB, CKMBINDEX, TROPONINI in the last 168 hours. BNP (last 3 results) No results for input(s): PROBNP in the last 8760 hours. HbA1C: No results for input(s): HGBA1C in the last 72 hours. CBG: Recent Labs  Lab 04/05/21 1921  GLUCAP 94   Lipid Profile: No results for input(s): CHOL, HDL, LDLCALC, TRIG, CHOLHDL, LDLDIRECT in the last 72 hours. Thyroid Function Tests: No results for input(s): TSH, T4TOTAL, FREET4, T3FREE, THYROIDAB in the last 72 hours. Anemia Panel: No results for input(s): VITAMINB12, FOLATE, FERRITIN, TIBC, IRON, RETICCTPCT in the last 72 hours. Urine analysis:    Component Value Date/Time   COLORURINE STRAW (A) 04/05/2021 2105   APPEARANCEUR CLEAR 04/05/2021 2105   LABSPEC 1.018 04/05/2021 2105   PHURINE 5.0 04/05/2021 2105   GLUCOSEU NEGATIVE 04/05/2021 2105   HGBUR NEGATIVE 04/05/2021 2105   BILIRUBINUR NEGATIVE 04/05/2021 2105   KETONESUR NEGATIVE 04/05/2021 2105   PROTEINUR NEGATIVE 04/05/2021 2105   UROBILINOGEN 0.2 01/17/2010 1140   NITRITE NEGATIVE 04/05/2021 2105   LEUKOCYTESUR NEGATIVE 04/05/2021 2105    Radiological Exams on Admission: DG Chest Portable 1 View  Result Date: 04/05/2021 CLINICAL DATA:  Altered mental status, status post fall. EXAM: PORTABLE CHEST 1 VIEW COMPARISON:  August 21, 2020 FINDINGS: The heart size and mediastinal contours are within normal limits. A coronary artery stent is seen. Low lung  volumes are noted with mild left basilar atelectasis. There is no evidence of a pleural effusion or pneumothorax. The visualized skeletal structures are unremarkable. IMPRESSION: Low lung volumes with mild left basilar atelectasis. Electronically Signed   By: Virgina Norfolk M.D.   On: 04/05/2021 21:42   CT HEAD CODE STROKE WO CONTRAST  Result Date: 04/05/2021 CLINICAL DATA:  Code stroke.  Left weakness EXAM: CT HEAD WITHOUT CONTRAST TECHNIQUE: Contiguous axial images were obtained from the base of the skull through the vertex without intravenous contrast. RADIATION DOSE REDUCTION: This exam was performed according to the departmental dose-optimization program which includes automated exposure control, adjustment of the mA and/or kV according to patient size and/or use of iterative reconstruction technique. COMPARISON:  None. FINDINGS: Brain: No evidence of acute infarction, hemorrhage, cerebral edema, mass, mass effect, or midline shift. Ventricles and sulci are normal for age. No extra-axial fluid collection. Periventricular white matter changes, likely the sequela of chronic small vessel ischemic disease. Degree of cerebral volume loss is within normal limits for age. Vascular: No hyperdense vessel or unexpected calcification. Skull: Normal. Negative for fracture or focal lesion. Sinuses/Orbits: Mucosal thickening in the left maxillary sinus and ethmoid air cells. Status post bilateral lens replacements. Other: The mastoid air cells are well aerated. ASPECTS [facility] Stroke Program Early CT Score) - Ganglionic level infarction (caudate, lentiform nuclei, internal capsule, insula, M1-M3 cortex): 7 - Supraganglionic infarction (M4-M6 cortex): 3 Total score (0-10 with 10 being normal): 10 IMPRESSION: 1. No acute intracranial process. 2. ASPECTS is 10 Code stroke imaging results were  communicated on 04/05/2021 at 7:30 pm to provider Dr. Leonel Ramsay via secure text paging. Electronically Signed   By: Merilyn Baba M.D.   On: 04/05/2021 19:30   CT ANGIO HEAD NECK W WO CM (CODE STROKE)  Result Date: 04/05/2021 CLINICAL DATA:  Initial evaluation for acute stroke. EXAM: CT ANGIOGRAPHY HEAD AND NECK TECHNIQUE: Multidetector CT imaging of the head and neck was performed using the standard protocol during bolus administration of intravenous contrast. Multiplanar CT image reconstructions and MIPs were obtained to evaluate the vascular anatomy. Carotid stenosis measurements (when applicable) are obtained utilizing NASCET criteria, using the distal internal carotid diameter as the denominator. RADIATION DOSE REDUCTION: This exam was performed according to the departmental dose-optimization program which includes automated exposure control, adjustment of the mA and/or kV according to patient size and/or use of iterative reconstruction technique. CONTRAST:  76mL OMNIPAQUE IOHEXOL 350 MG/ML SOLN COMPARISON:  Prior head CT from earlier the same day. FINDINGS: CTA NECK FINDINGS Aortic arch: Visualized aortic arch normal caliber with normal branch pattern. Mild-to-moderate atheromatous change about the arch itself. No hemodynamically significant stenosis about the origin the great vessels. Right carotid system: Right common and internal carotid arteries patent without stenosis or dissection. Mild eccentric plaque about the right carotid bulb without stenosis. Left carotid system: Left common and internal carotid arteries patent without stenosis or dissection. Mild atheromatous plaque about the left carotid bulb without stenosis. Vertebral arteries: Both vertebral arteries arise from subclavian arteries. Focal 65% atheromatous stenosis noted at the origin of the right subclavian artery (series 6, image 149). Left vertebral artery strongly dominant and widely patent within the neck. Focal severe ostial stenosis  noted at the origin of the hypoplastic right vertebral artery. Right vertebral artery patent proximally, but becomes increasingly attenuated as it courses cephalad within the neck, and occludes by the skull base. Skeleton: No discrete or worrisome osseous lesions. Moderate multilevel cervical spondylosis at C3-4 through C6-7. Other  neck: No other acute soft tissue abnormality within the neck. Upper chest: Visualized upper chest demonstrates no acute finding. Review of the MIP images confirms the above findings CTA HEAD FINDINGS Anterior circulation: Petrous segments patent bilaterally. Scattered atheromatous change within the carotid siphons with associated mild to moderate multifocal narrowing. A1 segments patent bilaterally. Normal anterior communicating artery complex. Anterior cerebral arteries patent without stenosis. No M1 stenosis or occlusion. Normal MCA bifurcations. Distal MCA branches well perfused and symmetric. Posterior circulation: Dominant left vertebral artery widely patent to the vertebrobasilar junction. Right vertebral artery occluded. Neither PICA well visualized. Basilar patent to its distal aspect without stenosis. Superior cerebellar arteries patent bilaterally. Both PCAs primarily supplied via the basilar well perfused or distal aspects. Venous sinuses: Patent allowing for timing the contrast bolus. Anatomic variants: Strongly dominant left vertebral artery. No aneurysm. Review of the MIP images confirms the above findings IMPRESSION: 1. Negative CTA for emergent large vessel occlusion. 2. 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 3. 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 Right vertebral artery becomes increasingly attenuated as it courses cephalad within the neck, and occludes by the skull base. Dominant left vertebral artery widely patent. 4. Atheromatous change about the carotid siphons with associated mild to  moderate multifocal narrowing. These results were communicated to Dr. Leonel Ramsay at 7:49 pm on 04/05/2021 by text page via the [facility] messaging system Electronically Signed   By: Jeannine Boga M.D.   On: 04/05/2021 19:53    EKG: Independently reviewed.  Assessment/Plan Principal Problem:   TIA (transient ischemic attack) Active Problems:   CAD (coronary artery disease)   Ischemic cardiomyopathy   Chronic combined systolic and diastolic heart failure (HCC)   Hyperlipidemia LDL goal <70   COVID-19 virus infection    TIA - TIA pathway and work up MRI brain Tele monitor ASA 325 daily Frequent neuro checks PT/OT/SLP 2d echo See neuro consult note COVID-19 - Mild at this time with no O2 requirement Tylenol PRN fever Pt is vaccinated (at least 4 doses, PFIZER) Start Molnupiravir CHF - Cont coreg Holding losartan (at least until MRI confirmed negative) No evidence for decompensation at this time HLD - cont crestor  DVT prophylaxis: Lovenox Code Status: Full Family Communication: No family in room Disposition Plan: Home after TIA work up [employer] called: Dr. Leonel Ramsay Admission status: Place in obs    Albi Rappaport, [place] [facility]  How to contact the [facility] Attending or Consulting provider [place] or covering provider during after hours 7P -7A, for this patient?  Check the care team in [facility] and look for a) attending/consulting [facility] provider listed and b) the [facility] team listed Log into [email or web address]  Amion Physician Scheduling and messaging for groups and whole hospitals  On call and physician scheduling software for group practices, residents, hospitalists and other medical providers for call, clinic, rotation and shift schedules. On[employer] is a hospital-wide system for scheduling doctors and paging doctors on call. EasyPlot is for scientific plotting and data analysis.  [email or web address]  and use [place]'s universal password to access. If you do not  have the password, please contact the hospital operator.  Locate the [facility] provider you are looking for under [facility] and page to a number that you can be directly reached. If you still have difficulty reaching the provider, please page the [facility] (Director on Call) for the Hospitalists listed on amion for assistance.  04/05/2021, 11:58 PM

## 2021-04-05 NOTE — Progress Notes (Signed)
EEG complete - results pending 

## 2021-04-05 NOTE — Code Documentation (Signed)
Eugene [name] is an 88 yr old male with PMH of CAD and Rt BKA. He is on no blood thinners. Eugene [name] was in his normal state of health today when he had a witnessed "sliding out of chair" at 1815. Pt states he did not lose consciousness, family states that he did. He had difficulty getting up off of the floor, so EMS was called. Upon EMS arrival, Pt was weak on the left side and had some slurred speech. By the time pt arrived to [facility] at 1919, weakness and speech had resolved. Airway cleared, labs and CBG obtained at bridge. Pt taken to CT. CTNC neg for acute hemorrhage per Dr Leonel Ramsay. CTA obtained. Per Dr Leonel Ramsay, CTA neg for  LVO. NIHSS 0. Pt walked for CT bed to stretcher, and gait was ataxic. For that reason, Pt will be deemed too mild to treat and will be monitored q 30 min until OOW at 2245. Bedside handoff with ED RN complete.

## 2021-04-05 NOTE — ED Triage Notes (Signed)
Pt BIB EMS from home- Code Stroke. LKW [id number] - per pt family pt slid out of chair and was unconscious. On EMS Arrival pt had left sided arm drift, slurred speech, and r sided facial droop. On arrival to ED pt symptoms resolved.  Pt has right BKA.  Pt with no hx of seizures, strokes - hx of HTN and neuropathy. Started gabapentin today.

## 2021-04-05 NOTE — ED Notes (Signed)
ED Provider and Neuro informed of pt temp

## 2021-04-05 NOTE — ED Provider Notes (Signed)
[facility]ERGENCY DEPARTMENT Provider Note   CSN: [id number] Arrival date & time: 04/05/21  1918  An emergency department physician performed an initial assessment on this suspected stroke patient at 88.  History  Chief Complaint  Patient presents with   Code Stroke    Eugene [name] is a 88 y.o. male.  The history is provided by the patient, medical records and the EMS personnel. No language interpreter was used.  Neurologic Problem This is a new problem. The current episode started 1 to 2 hours ago. The problem occurs rarely. The problem has been resolved. Pertinent negatives include no chest pain, no abdominal pain, no headaches and no shortness of breath. Nothing aggravates the symptoms. Nothing relieves the symptoms. He has tried nothing for the symptoms. The treatment provided no relief.      Home Medications Prior to Admission medications   Medication Sig Start Date End Date Taking? Authorizing Provider  ALPRAZolam Duanne Moron) 0.5 MG tablet Take 1 tablet (0.5 mg total) by mouth at bedtime as needed. for sleep 08/16/20   Susy Frizzle, MD  aspirin 81 MG tablet Take 81 mg by mouth at bedtime.    [provider]  budesonide (ENTOCORT EC) 3 MG 24 hr capsule Take 9 mg by mouth every morning. 02/25/21   [provider]  carvedilol (COREG) 3.125 MG tablet TAKE 1 TABLET BY MOUTH TWICE A DAY WITH MEALS 09/27/20   Burnell Blanks, MD  cholestyramine Lucrezia Starch) 4 GM/DOSE powder USE 1 SCOOP 3 TIMES A DAY WITH A MEAL 04/07/19   Susy Frizzle, MD  fluorouracil (EFUDEX) 5 % cream SMARTSIG:Sparingly Topical Twice Daily 07/19/20   [provider]  gabapentin (NEURONTIN) 100 MG capsule Take 1 capsule (100 mg total) by mouth 3 (three) times daily as needed (nerve pain). 04/05/21   Susy Frizzle, MD  losartan (COZAAR) 25 MG tablet TAKE 1 TABLET BY MOUTH DAILY. PLEASE KEEP UPCOMING APPT FOR FUTURE REFILLS THANK YOU 01/22/21   Burnell Blanks, MD  Multiple Vitamins-Minerals (PRESERVISION AREDS PO) Take 1 tablet by mouth 2 (two) times daily.    [provider]  nitroGLYCERIN (NITROSTAT) 0.4 MG SL tablet Place 1 tablet (0.4 mg total) under the tongue every 5 (five) minutes x 3 doses as needed for chest pain. 02/15/20   Susy Frizzle, MD  Omega-3 Fatty Acids (FISH OIL PO) Take 1 capsule by mouth at bedtime.    [provider]  rosuvastatin (CRESTOR) 20 MG tablet Take 1 tablet (20 mg total) by mouth daily. 03/15/21   Susy Frizzle, MD      Allergies    Aleve [naproxen sodium], Amoxicillin, Gabapentin, Morphine and related, and Penicillins    Review of Systems   Review of Systems  Constitutional:  Positive for fever (on arrival). Negative for appetite change, chills and fatigue.  HENT:  Negative for congestion.   Eyes:  Negative for visual disturbance.  Respiratory:  Negative for cough, chest tightness, shortness of breath and wheezing.   Cardiovascular:  Negative for chest pain.  Gastrointestinal:  Negative for abdominal pain, constipation, diarrhea, nausea and vomiting.  Genitourinary:  Negative for flank pain.  Musculoskeletal:  Negative for back pain and neck pain.  Skin:  Negative for rash and wound.  Neurological:  Positive for facial asymmetry (by report), speech difficulty (by report) and weakness (by report). Negative for seizures, light-headedness, numbness and headaches.  Psychiatric/Behavioral:  Negative for agitation.   All other systems reviewed  and are negative.  Physical Exam Updated Vital Signs BP (!) 161/82    Pulse 87    Temp (!) 101.4 F (38.6 C) (Oral)    Resp 18    SpO2 94%  Physical Exam Vitals and nursing note reviewed.  Constitutional:      General: He is not in acute distress.    Appearance: He is well-developed. He is not ill-appearing, toxic-appearing or diaphoretic.  HENT:     Head: Normocephalic and atraumatic.     Nose: Nose normal.     Mouth/Throat:      Mouth: Mucous membranes are moist.  Eyes:     Extraocular Movements: Extraocular movements intact.     Conjunctiva/sclera: Conjunctivae normal.     Pupils: Pupils are equal, round, and reactive to light.  Cardiovascular:     Rate and Rhythm: Normal rate and regular rhythm.     Heart sounds: No murmur heard. Pulmonary:     Effort: Pulmonary effort is normal. No respiratory distress.     Breath sounds: Normal breath sounds. No wheezing, rhonchi or rales.  Chest:     Chest wall: No tenderness.  Abdominal:     Palpations: Abdomen is soft.     Tenderness: There is no abdominal tenderness. There is no guarding or rebound.  Musculoskeletal:        General: No swelling or tenderness.     Cervical back: Neck supple.  Skin:    General: Skin is warm and dry.     Capillary Refill: Capillary refill takes less than 2 seconds.     Findings: No erythema.  Neurological:     General: No focal deficit present.     Mental Status: He is alert. Mental status is at baseline.  Psychiatric:        Mood and Affect: Mood normal.    ED Results / Procedures / Treatments   Labs (all labs ordered are listed, but only abnormal results are displayed) Labs Reviewed  RESP PANEL BY RT-PCR (FLU A&B, COVID) ARPGX2 - Abnormal; Notable for the following components:      Result Value   SARS Coronavirus 2 by RT PCR POSITIVE (*)    All other components within normal limits  CBC - Abnormal; Notable for the following components:   MCV 102.3 (*)    Platelets 142 (*)    All other components within normal limits  COMPREHENSIVE METABOLIC PANEL - Abnormal; Notable for the following components:   Sodium 133 (*)    Calcium 8.5 (*)    Total Protein 5.6 (*)    Albumin 3.2 (*)    All other components within normal limits  URINALYSIS, ROUTINE W REFLEX MICROSCOPIC - Abnormal; Notable for the following components:   Color, Urine STRAW (*)    All other components within normal limits  I-STAT CHEM 8, ED - Abnormal; Notable  for the following components:   Sodium 133 (*)    Calcium, Ion 0.94 (*)    All other components within normal limits  URINE CULTURE  PROTIME-INR  APTT  DIFFERENTIAL  CBG MONITORING, ED    EKG EKG Interpretation  Date/Time:  Friday April 05 2021 19:46:46 EST Ventricular Rate:  89 PR Interval:  143 QRS Duration: 89 QT Interval:  341 QTC Calculation: 415 R Axis:   -44 Text Interpretation: Sinus rhythm Left anterior fascicular block when compared to prior, more faster and more regular rate. NO STEMI Confirmed by Antony Blackbird [phone]) on 04/05/2021 11:16:29 PM  Radiology DG Chest  Portable 1 View  Result Date: 04/05/2021 CLINICAL DATA:  Altered mental status, status post fall. EXAM: PORTABLE CHEST 1 VIEW COMPARISON:  August 21, 2020 FINDINGS: The heart size and mediastinal contours are within normal limits. A coronary artery stent is seen. Low lung volumes are noted with mild left basilar atelectasis. There is no evidence of a pleural effusion or pneumothorax. The visualized skeletal structures are unremarkable. IMPRESSION: Low lung volumes with mild left basilar atelectasis. Electronically Signed   By: Virgina Norfolk M.D.   On: 04/05/2021 21:42   CT HEAD CODE STROKE WO CONTRAST  Result Date: 04/05/2021 CLINICAL DATA:  Code stroke.  Left weakness EXAM: CT HEAD WITHOUT CONTRAST TECHNIQUE: Contiguous axial images were obtained from the base of the skull through the vertex without intravenous contrast. RADIATION DOSE REDUCTION: This exam was performed according to the departmental dose-optimization program which includes automated exposure control, adjustment of the mA and/or kV according to patient size and/or use of iterative reconstruction technique. COMPARISON:  None. FINDINGS: Brain: No evidence of acute infarction, hemorrhage, cerebral edema, mass, mass effect, or midline shift. Ventricles and sulci are normal for age. No extra-axial fluid collection. Periventricular white matter  changes, likely the sequela of chronic small vessel ischemic disease. Degree of cerebral volume loss is within normal limits for age. Vascular: No hyperdense vessel or unexpected calcification. Skull: Normal. Negative for fracture or focal lesion. Sinuses/Orbits: Mucosal thickening in the left maxillary sinus and ethmoid air cells. Status post bilateral lens replacements. Other: The mastoid air cells are well aerated. ASPECTS [facility] Stroke Program Early CT Score) - Ganglionic level infarction (caudate, lentiform nuclei, internal capsule, insula, M1-M3 cortex): 7 - Supraganglionic infarction (M4-M6 cortex): 3 Total score (0-10 with 10 being normal): 10 IMPRESSION: 1. No acute intracranial process. 2. ASPECTS is 10 Code stroke imaging results were communicated on 04/05/2021 at 7:30 pm to provider Dr. Leonel Ramsay via secure text paging. Electronically Signed   By: Merilyn Baba M.D.   On: 04/05/2021 19:30   CT ANGIO HEAD NECK W WO CM (CODE STROKE)  Result Date: 04/05/2021 CLINICAL DATA:  Initial evaluation for acute stroke. EXAM: CT ANGIOGRAPHY HEAD AND NECK TECHNIQUE: Multidetector CT imaging of the head and neck was performed using the standard protocol during bolus administration of intravenous contrast. Multiplanar CT image reconstructions and MIPs were obtained to evaluate the vascular anatomy. Carotid stenosis measurements (when applicable) are obtained utilizing NASCET criteria, using the distal internal carotid diameter as the denominator. RADIATION DOSE REDUCTION: This exam was performed according to the departmental dose-optimization program which includes automated exposure control, adjustment of the mA and/or kV according to patient size and/or use of iterative reconstruction technique. CONTRAST:  14mL OMNIPAQUE IOHEXOL 350 MG/ML SOLN COMPARISON:  Prior head CT from earlier the same day. FINDINGS: CTA NECK FINDINGS Aortic arch: Visualized aortic arch normal caliber with normal branch pattern.  Mild-to-moderate atheromatous change about the arch itself. No hemodynamically significant stenosis about the origin the great vessels. Right carotid system: Right common and internal carotid arteries patent without stenosis or dissection. Mild eccentric plaque about the right carotid bulb without stenosis. Left carotid system: Left common and internal carotid arteries patent without stenosis or dissection. Mild atheromatous plaque about the left carotid bulb without stenosis. Vertebral arteries: Both vertebral arteries arise from subclavian arteries. Focal 65% atheromatous stenosis noted at the origin of the right subclavian artery (series 6, image 149). Left vertebral artery strongly dominant and widely patent within the neck. Focal severe ostial stenosis noted at the  origin of the hypoplastic right vertebral artery. Right vertebral artery patent proximally, but becomes increasingly attenuated as it courses cephalad within the neck, and occludes by the skull base. Skeleton: No discrete or worrisome osseous lesions. Moderate multilevel cervical spondylosis at C3-4 through C6-7. Other neck: No other acute soft tissue abnormality within the neck. Upper chest: Visualized upper chest demonstrates no acute finding. Review of the MIP images confirms the above findings CTA HEAD FINDINGS Anterior circulation: Petrous segments patent bilaterally. Scattered atheromatous change within the carotid siphons with associated mild to moderate multifocal narrowing. A1 segments patent bilaterally. Normal anterior communicating artery complex. Anterior cerebral arteries patent without stenosis. No M1 stenosis or occlusion. Normal MCA bifurcations. Distal MCA branches well perfused and symmetric. Posterior circulation: Dominant left vertebral artery widely patent to the vertebrobasilar junction. Right vertebral artery occluded. Neither PICA well visualized. Basilar patent to its distal aspect without stenosis. Superior cerebellar  arteries patent bilaterally. Both PCAs primarily supplied via the basilar well perfused or distal aspects. Venous sinuses: Patent allowing for timing the contrast bolus. Anatomic variants: Strongly dominant left vertebral artery. No aneurysm. Review of the MIP images confirms the above findings IMPRESSION: 1. Negative CTA for emergent large vessel occlusion. 2. 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 3. 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 Right vertebral artery becomes increasingly attenuated as it courses cephalad within the neck, and occludes by the skull base. Dominant left vertebral artery widely patent. 4. Atheromatous change about the carotid siphons with associated mild to moderate multifocal narrowing. These results were communicated to Dr. Leonel Ramsay at 7:49 pm on 04/05/2021 by text page via the [facility] messaging system Electronically Signed   By: Jeannine Boga M.D.   On: 04/05/2021 19:53    Procedures Procedures    Medications Ordered in ED Medications  sodium chloride flush (NS) 0.9 % injection 3 mL (has no administration in time range)  acetaminophen (TYLENOL) tablet 650 mg (has no administration in time range)  iohexol (OMNIPAQUE) 350 MG/ML injection 75 mL (75 mLs Intravenous Contrast Given 04/05/21 1936)    ED Course/ Medical Decision Making/ A&P                           Medical Decision Making Amount and/or Complexity of Data Reviewed Labs: ordered. Radiology: ordered.  Risk OTC drugs. Decision regarding hospitalization.   Eugene [name] is a 88 y.o. male with a past medical history significant for CAD with prior STEMI, hyperlipidemia, CHF, and right leg amputation who presents as a code stroke.  According to EMS report, patient was at home last normal at 6:15 PM when he slid out of his chair with altered mental status and unconsciousness.  Per EMS, patient had a left-sided arm drift, foot  drag, some slurred speech, and right facial droop.  Patient was activated as a code stroke in route.  By the time patient arrived to the emergency department for evaluation his symptoms had resolved.  Otherwise, he denies other complaints.  He denies fevers, chills congestion, cough, nausea, vomiting, constipation, diarrhea, or urinary symptoms.  Of note, he is febrile and slightly tachypneic but otherwise denies complaints.  Denies any sick contacts.  On my exam, lungs clear and chest nontender.  Abdomen nontender.  Patient moving extremities symmetrically.  Pupils are symmetric and reactive with normal extraocular Movements.  No seizure-like activity on my exam.  Neurology saw patient and  feels patient did have a TIA and will need admission.  He agreed with further work-up for fevers.  Patient will have chest x-ray, urine, labs, and COVID/flu swab.    Urinalysis returned did not show infection.  No leukocytosis or anemia.  LFTs not elevated.  Creatinine not elevated.   10:25 PM Patient's COVID test is positive.  Suspect this is the cause of his fever.  Per neurology recommendations, patient will be admitted for further management of TIA in the setting of COVID.  Medicine will be called for admission for TIA with COVID.        Final Clinical Impression(s) / ED Diagnoses Final diagnoses:  TIA (transient ischemic attack)  COVID-19    Clinical Impression: 1. TIA (transient ischemic attack)   2. COVID-19     Disposition: Admit  This note was prepared with assistance of Dragon voice recognition software. Occasional wrong-word or sound-a-like substitutions may have occurred due to the inherent limitations of voice recognition software.     Nazaiah Navarrete, Gwenyth Allegra, MD 04/05/21 [phone]

## 2021-04-06 ENCOUNTER — Observation Stay (HOSPITAL_COMMUNITY): Payer: PPO

## 2021-04-06 ENCOUNTER — Observation Stay (HOSPITAL_BASED_OUTPATIENT_CLINIC_OR_DEPARTMENT_OTHER): Payer: PPO

## 2021-04-06 ENCOUNTER — Encounter (HOSPITAL_COMMUNITY): Payer: Self-pay | Admitting: Internal Medicine

## 2021-04-06 DIAGNOSIS — G459 Transient cerebral ischemic attack, unspecified: Secondary | ICD-10-CM

## 2021-04-06 DIAGNOSIS — U071 COVID-19: Secondary | ICD-10-CM | POA: Diagnosis not present

## 2021-04-06 DIAGNOSIS — E785 Hyperlipidemia, unspecified: Secondary | ICD-10-CM | POA: Diagnosis not present

## 2021-04-06 DIAGNOSIS — I255 Ischemic cardiomyopathy: Secondary | ICD-10-CM | POA: Diagnosis not present

## 2021-04-06 DIAGNOSIS — G9341 Metabolic encephalopathy: Secondary | ICD-10-CM | POA: Diagnosis not present

## 2021-04-06 DIAGNOSIS — I5042 Chronic combined systolic (congestive) and diastolic (congestive) heart failure: Secondary | ICD-10-CM | POA: Diagnosis not present

## 2021-04-06 DIAGNOSIS — I639 Cerebral infarction, unspecified: Secondary | ICD-10-CM | POA: Diagnosis not present

## 2021-04-06 LAB — C-REACTIVE PROTEIN: CRP: 3.6 mg/dL — ABNORMAL HIGH (ref <1.0)

## 2021-04-06 LAB — CBC WITH DIFFERENTIAL/PLATELET
Abs Immature Granulocytes: 0.04 10*3/uL (ref 0.00–0.07)
Basophils Absolute: 0 10*3/uL (ref 0.0–0.1)
Basophils Relative: 0 %
Eosinophils Absolute: 0 10*3/uL (ref 0.0–0.5)
Eosinophils Relative: 0 %
HCT: 40.9 % (ref 39.0–52.0)
Hemoglobin: 13.8 g/dL (ref 13.0–17.0)
Immature Granulocytes: 1 %
Lymphocytes Relative: 30 %
Lymphs Abs: 1.8 10*3/uL (ref 0.7–4.0)
MCH: 33.2 pg (ref 26.0–34.0)
MCHC: 33.7 g/dL (ref 30.0–36.0)
MCV: 98.3 fL (ref 80.0–100.0)
Monocytes Absolute: 1.1 10*3/uL — ABNORMAL HIGH (ref 0.1–1.0)
Monocytes Relative: 19 %
Neutro Abs: 2.9 10*3/uL (ref 1.7–7.7)
Neutrophils Relative %: 50 %
Platelets: 159 10*3/uL (ref 150–400)
RBC: 4.16 MIL/uL — ABNORMAL LOW (ref 4.22–5.81)
RDW: 12.5 % (ref 11.5–15.5)
WBC: 5.8 10*3/uL (ref 4.0–10.5)
nRBC: 0 % (ref 0.0–0.2)

## 2021-04-06 LAB — COMPREHENSIVE METABOLIC PANEL
ALT: 23 U/L (ref 0–44)
AST: 27 U/L (ref 15–41)
Albumin: 2.8 g/dL — ABNORMAL LOW (ref 3.5–5.0)
Alkaline Phosphatase: 54 U/L (ref 38–126)
Anion gap: 6 (ref 5–15)
BUN: 9 mg/dL (ref 8–23)
CO2: 23 mmol/L (ref 22–32)
Calcium: 8.1 mg/dL — ABNORMAL LOW (ref 8.9–10.3)
Chloride: 104 mmol/L (ref 98–111)
Creatinine, Ser: 0.96 mg/dL (ref 0.61–1.24)
GFR, Estimated: >60 mL/min (ref >60)
Glucose, Bld: 87 mg/dL (ref 70–99)
Potassium: 3.4 mmol/L — ABNORMAL LOW (ref 3.5–5.1)
Sodium: 133 mmol/L — ABNORMAL LOW (ref 135–145)
Total Bilirubin: 0.7 mg/dL (ref 0.3–1.2)
Total Protein: 5.2 g/dL — ABNORMAL LOW (ref 6.5–8.1)

## 2021-04-06 LAB — D-DIMER, QUANTITATIVE: D-Dimer, Quant: 0.62 ug/mL-FEU — ABNORMAL HIGH (ref 0.00–0.50)

## 2021-04-06 LAB — ECHOCARDIOGRAM COMPLETE
AR max vel: 1.55 cm2
AV Area VTI: 1.95 cm2
AV Area mean vel: 1.55 cm2
AV Mean grad: 9 mmHg
AV Peak grad: 15.8 mmHg
Ao pk vel: 1.99 m/s
Area-P 1/2: 2.77 cm2
Calc EF: 63.3 %
Height: 70 in
P 1/2 time: 519 msec
S' Lateral: 2.2 cm
Single Plane A2C EF: 52.3 %
Single Plane A4C EF: 67.5 %
Weight: 2447.99 oz

## 2021-04-06 LAB — LIPID PANEL
Cholesterol: 117 mg/dL (ref 0–200)
HDL: 58 mg/dL (ref >40)
LDL Cholesterol: 51 mg/dL (ref 0–99)
Total CHOL/HDL Ratio: 2 RATIO
Triglycerides: 40 mg/dL (ref <150)
VLDL: 8 mg/dL (ref 0–40)

## 2021-04-06 LAB — VITAMIN B12: Vitamin B-12: 115 pg/mL — ABNORMAL LOW (ref 180–914)

## 2021-04-06 LAB — TSH: TSH: 1.778 u[IU]/mL (ref 0.350–4.500)

## 2021-04-06 LAB — PROCALCITONIN: Procalcitonin: <0.1 ng/mL

## 2021-04-06 MED ORDER — NIRMATRELVIR/RITONAVIR (PAXLOVID)TABLET
3.0000 | ORAL_TABLET | Freq: Two times a day (BID) | ORAL | Status: DC
  Administered 2021-04-06: 3 via ORAL
  Filled 2021-04-06: qty 30

## 2021-04-06 MED ORDER — PAXLOVID (300/100) 20 X 150 MG & 10 X 100MG PO TBPK: 1.0000 | ORAL_TABLET | Freq: Two times a day (BID) | ORAL | 0 refills | Status: AC

## 2021-04-06 MED ORDER — ASPIRIN 325 MG PO TABS
325.0000 mg | ORAL_TABLET | Freq: Every day | ORAL | Status: DC
  Administered 2021-04-06: 325 mg via ORAL
  Filled 2021-04-06: qty 1

## 2021-04-06 NOTE — Discharge Summary (Signed)
[facility]  Physician Discharge Summary   Patient ID: Eugene [name] MRN: [id number] DOB/AGE: 1931-03-27 88 y.o.  Admit date: 04/05/2021 Discharge date: 04/06/2021    PCP: Susy Frizzle, MD  DISCHARGE DIAGNOSES:  Generalized weakness likely secondary to COVID-19 COVID-19 infection Hyperlipidemia Chronic diastolic CHF, grade 2 diastolic dysfunction noted on echocardiogram   RECOMMENDATIONS FOR OUTPATIENT FOLLOW UP: Follow-up with primary care provider in 1 week   Home Health: None Equipment/Devices: None  CODE STATUS: Full code  DISCHARGE CONDITION: fair  Diet recommendation: As before  INITIAL HISTORY: As per H&P: "Eugene [name] is a 88 y.o. 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 Pt presents to ED with transient episode of L sided weakness. No LOC.  States that he suddenly couldn't get up and slid out of chair and ended up on ground. The patient does not feel that he ever lost consciousness.  His niece arrived about 5 minutes after the event, and found him on the ground, trying to get up.  Once EMS arrived and they stood him up, it was noted that he had significant left-sided weakness.  Prior to transfer him to the stretcher, however, his deficits rapidly improved."  Consultations: Neurology  Procedures: Transthoracic echocardiogram  HOSPITAL COURSE:   Patient admitted after he had a fall at home.  There was initially some concern for left-sided weakness.  But this history was never truly corroborated by family.  Patient was found to be positive for COVID-19 and was noted to be febrile.  It was felt that his weakness was likely due to acute infection.  Work-up in the hospital has been unremarkable.  MRI brain did not show any acute findings.  CT angiogram head and neck showed no emergent large vessel occlusion.  65% stenosis at the origin of the right subclavian artery.  Focal  severe ostial stenosis at the origin of the right vertebral artery was noted.  Left vertebral artery was widely patent.  2D echocardiogram showed normal systolic function with grade 2 diastolic dysfunction.  Noted to be euvolemic.  Plan is to continue with aspirin and statin.  Patient was found to be incidentally positive for COVID-19.  Apparently his wife also has acute infection.  He was afebrile during this hospital stay.  However he did not have any dyspnea.  Chest x-ray did not show any opacities.  Patient was started on Paxlovid and will be discharged on the same.  No indication for steroids.  He was saturating normal on room air.  Potassium to be repleted prior to discharge.  Patient was seen by physical therapy and no needs were identified.  All of the above was discussed with patient and his son-in-law.  Discussed also with neurology.  Patient is stable for discharge today.   PERTINENT LABS:  The results of significant diagnostics from this hospitalization (including imaging, microbiology, ancillary and laboratory) are listed below for reference.    Microbiology: Recent Results (from the past 240 hour(s))  Urine Culture     Status: Abnormal   Collection Time: 04/05/21  8:39 PM   Specimen: Urine, Clean Catch  Result Value Ref Range Status   Specimen Description URINE, CLEAN CATCH  Final   Special Requests NONE  Final   Culture (A)  Final    <10,000 COLONIES/mL INSIGNIFICANT GROWTH Performed at [facility], [address] [place],  [address]    Report Status 04/07/2021 FINAL  Final  Resp Panel by RT-PCR (Flu A&B, Covid)     Status: Abnormal   Collection Time: 04/05/21  9:15 PM   Specimen: Nasopharyngeal(NP) swabs in vial transport medium  Result Value Ref Range Status   SARS Coronavirus 2 by RT PCR POSITIVE (A) NEGATIVE Final    Comment: (NOTE) SARS-CoV-2 target nucleic acids are DETECTED.  The SARS-CoV-2 RNA is generally detectable in upper  respiratory specimens during the acute phase of infection. Positive results are indicative of the presence of the identified virus, but do not rule out bacterial infection or co-infection with other pathogens not detected by the test. Clinical correlation with patient history and other diagnostic information is necessary to determine patient infection status. The expected result is Negative.  Fact Sheet for Patients: EntrepreneurPulse.com.au  Fact Sheet for Healthcare Providers: IncredibleEmployment.be  This test is not yet approved or cleared by the [place] FDA and  has been authorized for detection and/or diagnosis of SARS-CoV-2 by FDA under an Emergency Use Authorization (EUA).  This EUA will remain in effect (meaning this test can be used) for the duration of  the COVID-19 declaration under Section 564(b)(1) of the A ct, 21 U.S.C. section 360bbb-3(b)(1), unless the authorization is terminated or revoked sooner.     Influenza A by PCR NEGATIVE NEGATIVE Final   Influenza B by PCR NEGATIVE NEGATIVE Final    Comment: (NOTE) The Xpert Xpress SARS-CoV-2/FLU/RSV plus assay is intended as an aid in the diagnosis of influenza from Nasopharyngeal swab specimens and should not be used as a sole basis for treatment. Nasal washings and aspirates are unacceptable for Xpert Xpress SARS-CoV-2/FLU/RSV testing.  Fact Sheet for Patients: EntrepreneurPulse.com.au  Fact Sheet for Healthcare Providers: IncredibleEmployment.be  This test is not yet approved or cleared by the [place] FDA and has been authorized for detection and/or diagnosis of SARS-CoV-2 by FDA under an Emergency Use Authorization (EUA). This EUA will remain in effect (meaning this test can be used) for the duration of the COVID-19 declaration under Section 564(b)(1) of the Act, 21 U.S.C. section 360bbb-3(b)(1), unless the authorization is  terminated or revoked.  Performed at [facility], [place] [address] [place], [place] [address]   Culture, blood (Routine X 2) w Reflex to ID Panel     Status: None (Preliminary result)   Collection Time: 04/06/21  3:19 AM   Specimen: BLOOD RIGHT HAND  Result Value Ref Range Status   Specimen Description BLOOD RIGHT HAND  Final   Special Requests   Final    BOTTLES DRAWN AEROBIC AND ANAEROBIC Blood Culture adequate volume   Culture   Final    NO GROWTH 1 DAY Performed at [facility], [place] [address] [place], [place] [address]    Report Status PENDING  Incomplete  Culture, blood (Routine X 2) w Reflex to ID Panel     Status: None (Preliminary result)   Collection Time: 04/06/21  3:40 AM   Specimen: BLOOD LEFT HAND  Result Value Ref Range Status   Specimen Description BLOOD LEFT HAND  Final   Special Requests   Final    BOTTLES DRAWN AEROBIC AND ANAEROBIC Blood Culture adequate volume   Culture   Final    NO GROWTH 1 DAY Performed at [facility], [place] [address], [place], [place] [address]    Report Status PENDING  Incomplete     Labs:  COVID-19 Labs  Recent Labs    04/06/21 0340  DDIMER 0.62*  CRP 3.6*  Lab Results  Component Value Date   SARSCOV2NAA POSITIVE (A) 04/05/2021   [place] Not Detected 09/19/2020   [place] Not Detected 10/11/2018      Basic Metabolic Panel: Recent Labs  Lab 04/05/21 1921 04/05/21 1926 04/06/21 0340  NA 133* 133* 133*  K 3.6 3.6 3.4*  CL 100 101 104  CO2 22  --  23  GLUCOSE 92 91 87  BUN 12 12 9   CREATININE 0.95 0.90 0.96  CALCIUM 8.5*  --  8.1*   Liver Function Tests: Recent Labs  Lab 04/05/21 1921 04/06/21 0340  AST 33 27  ALT 25 23  ALKPHOS 62 54  BILITOT 0.6 0.7  PROT 5.6* 5.2*  ALBUMIN 3.2* 2.8*    CBC: Recent Labs  Lab 04/05/21 1921 04/05/21 1926 04/06/21 0340  WBC 6.8  --  5.8  NEUTROABS 4.2  --  2.9  HGB 14.4 14.3 13.8  HCT 44.0 42.0 40.9  MCV 102.3*  --  98.3  PLT  142*  --  159     CBG: Recent Labs  Lab 04/05/21 1921  GLUCAP 94     IMAGING STUDIES MR BRAIN WO CONTRAST  Result Date: 04/06/2021 CLINICAL DATA:  Initial evaluation for acute stroke. EXAM: MRI HEAD WITHOUT CONTRAST TECHNIQUE: Multiplanar, multiecho pulse sequences of the brain and surrounding structures were obtained without intravenous contrast. COMPARISON:  Prior CTs from 04/05/2021. FINDINGS: Brain: Cerebral volume within normal limits. Mild chronic microvascular ischemic disease for age. Small remote right cerebellar infarct noted. No evidence for acute or subacute ischemia. Gray-white matter differentiation maintained. No other areas of remote cortical infarction. No acute intracranial hemorrhage. Single chronic microhemorrhage noted within the right frontal corona radiata, of doubtful significance in isolation. No mass lesion, midline shift or mass effect. No hydrocephalus or extra-axial fluid collection. Pituitary gland suprasellar region normal. Midline structures intact. Vascular: Absent flow void within the right vertebral artery, consistent with previously identified occlusion. Major intracranial vascular flow voids are otherwise maintained. Skull and upper cervical spine: Craniocervical junction within normal limits. Bone marrow signal intensity normal. No scalp soft tissue abnormality. Sinuses/Orbits: Prior bilateral ocular lens replacement. Moderate mucosal thickening present throughout the ethmoidal air cells and maxillary sinuses. No mastoid effusion. Other: None. IMPRESSION: 1. No acute intracranial abnormality. 2. Underlying mild chronic microvascular ischemic disease for age with small remote right cerebellar infarct. 3. Absent flow void within the right vertebral artery, consistent with occlusion as seen on prior CTA. Electronically Signed   By: Jeannine Boga M.D.   On: 04/06/2021 03:09   DG Chest Portable 1 View  Result Date: 04/05/2021 CLINICAL DATA:  Altered mental  status, status post fall. EXAM: PORTABLE CHEST 1 VIEW COMPARISON:  August 21, 2020 FINDINGS: The heart size and mediastinal contours are within normal limits. A coronary artery stent is seen. Low lung volumes are noted with mild left basilar atelectasis. There is no evidence of a pleural effusion or pneumothorax. The visualized skeletal structures are unremarkable. IMPRESSION: Low lung volumes with mild left basilar atelectasis. Electronically Signed   By: Virgina Norfolk M.D.   On: 04/05/2021 21:42   EEG adult  Result Date: 04/06/2021 [name], [name], MD     04/06/2021  7:38 AM [facility] Routine EEG Report Patient Name:   Edris, Friedt Date of Birth:   1931/11/07 Identification Number:   MRN - [id number] Date of Study:   04/05/2021 21:43:47 Indication: Encephalopathy, Technical Summary: A routine 20 channel electroencephalogram using the international 10-20 system of  electrode placement was performed. Background: 7-8 Hz, Posterior dominant rhythm that attenuated with eye Opening States      Awake      Drowsy: were seen during drowsiness Abnormalities Generalized Slowing: Diffuse generalized slowing Background Slowing: The background consists of 20-50 uV, 7-8 Hz diffuse activity with superimposed diffuse polymorphic delta activity that is non reactive to external stimulation. Activation Procedures Hyperventilation: Not performed Photic Stimulation: Classification: Abnormal : Diagnosis: This is abnormal EEG, the Presence of Generalized slowing is consistent with Encephalopathy Dr Hinda Lenis [name] [facility] [phone] Case [id number]  ECHOCARDIOGRAM COMPLETE  Result Date: 04/06/2021    ECHOCARDIOGRAM REPORT   Patient Name:   ZAVIYAR RAHAL Date of Exam: 04/06/2021 Medical Rec #:  [id number]     Height:       70.0 in Accession #:    [id number]    Weight:       153.0 lb Date of Birth:  05-05-1931      BSA:          1.863 m Patient Age:    34 years      BP:            125/77 mmHg Patient Gender: M             HR:           54 bpm. Exam Location:  Inpatient Procedure: 2D Echo, Cardiac Doppler and Color Doppler Indications:    STROKE  History:        Patient has prior history of Echocardiogram examinations, most                 recent 02/22/2020. Cardiomyopathy; CAD and Previous Myocardial                 Infarction. STEMI/ HLD/CHRONIC SYSTOLIC/ DIASTOLIC HEART                 FAILURE.  Sonographer:    Beryle Beams Referring Phys: [id number] [place] XU IMPRESSIONS  1. Left ventricular ejection fraction, by estimation, is 55%. The left ventricle has normal function. The left ventricle has no regional wall motion abnormalities. Left ventricular diastolic parameters are consistent with Grade II diastolic dysfunction (pseudonormalization). Elevated left ventricular end-diastolic pressure.  2. Right ventricular systolic function is normal. The right ventricular size is normal.  3. Left atrial size was mildly dilated.  4. The mitral valve is degenerative. Trivial mitral valve regurgitation. No evidence of mitral stenosis.  5. Gradients actually less than echo done 02/22/20 AS still mild . The aortic valve is tricuspid. There is moderate calcification of the aortic valve. There is moderate thickening of the aortic valve. Aortic valve regurgitation is mild. Mild aortic valve  stenosis.  6. The inferior vena cava is normal in size with greater than 50% respiratory variability, suggesting right atrial pressure of 3 mmHg. FINDINGS  Left Ventricle: Left ventricular ejection fraction, by estimation, is 55%. The left ventricle has normal function. The left ventricle has no regional wall motion abnormalities. The left ventricular internal cavity size was normal in size. There is no left ventricular hypertrophy. Left ventricular diastolic parameters are consistent with Grade II diastolic dysfunction (pseudonormalization). Elevated left ventricular end-diastolic pressure. Right Ventricle: The right  ventricular size is normal. No increase in right ventricular wall thickness. Right ventricular systolic function is normal. Left Atrium: Left atrial size was mildly dilated. Right Atrium: Right atrial size was normal in size. Pericardium: There is no evidence of pericardial effusion. Mitral Valve: The mitral valve  is degenerative in appearance. There is mild thickening of the mitral valve leaflet(s). There is mild calcification of the mitral valve leaflet(s). Mild mitral annular calcification. Trivial mitral valve regurgitation. No evidence of mitral valve stenosis. Tricuspid Valve: The tricuspid valve is normal in structure. Tricuspid valve regurgitation is mild . No evidence of tricuspid stenosis. Aortic Valve: Gradients actually less than echo done 02/22/20 AS still mild. The aortic valve is tricuspid. There is moderate calcification of the aortic valve. There is moderate thickening of the aortic valve. Aortic valve regurgitation is mild. Aortic regurgitation PHT measures 519 msec. Mild aortic stenosis is present. Aortic valve mean gradient measures 9.0 mmHg. Aortic valve peak gradient measures 15.8 mmHg. Aortic valve area, by VTI measures 1.95 cm. Pulmonic Valve: The pulmonic valve was normal in structure. Pulmonic valve regurgitation is not visualized. No evidence of pulmonic stenosis. Aorta: The aortic root is normal in size and structure. Venous: The inferior vena cava is normal in size with greater than 50% respiratory variability, suggesting right atrial pressure of 3 mmHg. IAS/Shunts: No atrial level shunt detected by color flow Doppler.  LEFT VENTRICLE PLAX 2D LVIDd:         4.00 cm     Diastology LVIDs:         2.20 cm     LV e' medial:    6.09 cm/s LV PW:         1.10 cm     LV E/e' medial:  14.3 LV IVS:        1.10 cm     LV e' lateral:   4.68 cm/s LVOT diam:     2.00 cm     LV E/e' lateral: 18.7 LV SV:         88 LV SV Index:   47 LVOT Area:     3.14 cm  LV Volumes (MOD) LV vol d, MOD A2C: 48.4 ml LV  vol d, MOD A4C: 57.0 ml LV vol s, MOD A2C: 23.1 ml LV vol s, MOD A4C: 18.5 ml LV SV MOD A2C:     25.3 ml LV SV MOD A4C:     57.0 ml LV SV MOD BP:      35.2 ml RIGHT VENTRICLE RV S prime:     15.00 cm/s TAPSE (M-mode): 2.4 cm LEFT ATRIUM             Index        RIGHT ATRIUM           Index LA diam:        3.90 cm 2.09 cm/m   RA Area:     13.60 cm LA Vol (A2C):   54.3 ml 29.15 ml/m  RA Volume:   34.80 ml  18.68 ml/m LA Vol (A4C):   34.2 ml 18.36 ml/m LA Biplane Vol: 45.4 ml 24.37 ml/m  AORTIC VALVE                     PULMONIC VALVE AV Area (Vmax):    1.55 cm      PV Vmax:       0.37 m/s AV Area (Vmean):   1.55 cm      PV Vmean:      26.100 cm/s AV Area (VTI):     1.95 cm      PV VTI:        0.087 m AV Vmax:           199.00 cm/s   PV  Peak grad:  0.6 mmHg AV Vmean:          141.000 cm/s  PV Mean grad:  0.0 mmHg AV VTI:            0.450 m AV Peak Grad:      15.8 mmHg AV Mean Grad:      9.0 mmHg LVOT Vmax:         98.30 cm/s LVOT Vmean:        69.700 cm/s LVOT VTI:          0.279 m LVOT/AV VTI ratio: 0.62 AI PHT:            519 msec  AORTA Ao Root diam: 2.50 cm MITRAL VALVE                TRICUSPID VALVE MV Area (PHT): 2.77 cm     TV Peak grad:   22.5 mmHg MV Decel Time: 274 msec     TV Mean grad:   15.0 mmHg MV E velocity: 87.33 cm/s   TV Vmax:        2.37 m/s MV A velocity: 141.00 cm/s  TV Vmean:       181.0 cm/s MV E/A ratio:  0.62         TV VTI:         0.71 msec                              SHUNTS                             Systemic VTI:  0.28 m                             Systemic Diam: 2.00 cm Jenkins Rouge MD Electronically signed by Jenkins Rouge MD Signature Date/Time: 04/06/2021/1:07:00 PM    Final    CT HEAD CODE STROKE WO CONTRAST  Result Date: 04/05/2021 CLINICAL DATA:  Code stroke.  Left weakness EXAM: CT HEAD WITHOUT CONTRAST TECHNIQUE: Contiguous axial images were obtained from the base of the skull through the vertex without intravenous contrast. RADIATION DOSE REDUCTION: This exam was  performed according to the departmental dose-optimization program which includes automated exposure control, adjustment of the mA and/or kV according to patient size and/or use of iterative reconstruction technique. COMPARISON:  None. FINDINGS: Brain: No evidence of acute infarction, hemorrhage, cerebral edema, mass, mass effect, or midline shift. Ventricles and sulci are normal for age. No extra-axial fluid collection. Periventricular white matter changes, likely the sequela of chronic small vessel ischemic disease. Degree of cerebral volume loss is within normal limits for age. Vascular: No hyperdense vessel or unexpected calcification. Skull: Normal. Negative for fracture or focal lesion. Sinuses/Orbits: Mucosal thickening in the left maxillary sinus and ethmoid air cells. Status post bilateral lens replacements. Other: The mastoid air cells are well aerated. ASPECTS [facility] Stroke Program Early CT Score) - Ganglionic level infarction (caudate, lentiform nuclei, internal capsule, insula, M1-M3 cortex): 7 - Supraganglionic infarction (M4-M6 cortex): 3 Total score (0-10 with 10 being normal): 10 IMPRESSION: 1. No acute intracranial process. 2. ASPECTS is 10 Code stroke imaging results were communicated on 04/05/2021 at 7:30 pm to provider Dr. Leonel Ramsay via secure text paging. Electronically Signed   By: Merilyn Baba M.D.   On: 04/05/2021 19:30   CT ANGIO HEAD NECK  W WO CM (CODE STROKE)  Result Date: 04/05/2021 CLINICAL DATA:  Initial evaluation for acute stroke. EXAM: CT ANGIOGRAPHY HEAD AND NECK TECHNIQUE: Multidetector CT imaging of the head and neck was performed using the standard protocol during bolus administration of intravenous contrast. Multiplanar CT image reconstructions and MIPs were obtained to evaluate the vascular anatomy. Carotid stenosis measurements (when applicable) are obtained utilizing NASCET criteria, using the distal internal carotid diameter as the denominator. RADIATION DOSE  REDUCTION: This exam was performed according to the departmental dose-optimization program which includes automated exposure control, adjustment of the mA and/or kV according to patient size and/or use of iterative reconstruction technique. CONTRAST:  22mL OMNIPAQUE IOHEXOL 350 MG/ML SOLN COMPARISON:  Prior head CT from earlier the same day. FINDINGS: CTA NECK FINDINGS Aortic arch: Visualized aortic arch normal caliber with normal branch pattern. Mild-to-moderate atheromatous change about the arch itself. No hemodynamically significant stenosis about the origin the great vessels. Right carotid system: Right common and internal carotid arteries patent without stenosis or dissection. Mild eccentric plaque about the right carotid bulb without stenosis. Left carotid system: Left common and internal carotid arteries patent without stenosis or dissection. Mild atheromatous plaque about the left carotid bulb without stenosis. Vertebral arteries: Both vertebral arteries arise from subclavian arteries. Focal 65% atheromatous stenosis noted at the origin of the right subclavian artery (series 6, image 149). Left vertebral artery strongly dominant and widely patent within the neck. Focal severe ostial stenosis noted at the origin of the hypoplastic right vertebral artery. Right vertebral artery patent proximally, but becomes increasingly attenuated as it courses cephalad within the neck, and occludes by the skull base. Skeleton: No discrete or worrisome osseous lesions. Moderate multilevel cervical spondylosis at C3-4 through C6-7. Other neck: No other acute soft tissue abnormality within the neck. Upper chest: Visualized upper chest demonstrates no acute finding. Review of the MIP images confirms the above findings CTA HEAD FINDINGS Anterior circulation: Petrous segments patent bilaterally. Scattered atheromatous change within the carotid siphons with associated mild to moderate multifocal narrowing. A1 segments patent  bilaterally. Normal anterior communicating artery complex. Anterior cerebral arteries patent without stenosis. No M1 stenosis or occlusion. Normal MCA bifurcations. Distal MCA branches well perfused and symmetric. Posterior circulation: Dominant left vertebral artery widely patent to the vertebrobasilar junction. Right vertebral artery occluded. Neither PICA well visualized. Basilar patent to its distal aspect without stenosis. Superior cerebellar arteries patent bilaterally. Both PCAs primarily supplied via the basilar well perfused or distal aspects. Venous sinuses: Patent allowing for timing the contrast bolus. Anatomic variants: Strongly dominant left vertebral artery. No aneurysm. Review of the MIP images confirms the above findings IMPRESSION: 1. Negative CTA for emergent large vessel occlusion. 2. 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 3. 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 Right vertebral artery becomes increasingly attenuated as it courses cephalad within the neck, and occludes by the skull base. Dominant left vertebral artery widely patent. 4. Atheromatous change about the carotid siphons with associated mild to moderate multifocal narrowing. These results were communicated to Dr. Leonel Ramsay at 7:49 pm on 04/05/2021 by text page via the [facility] messaging system Electronically Signed   By: Jeannine Boga M.D.   On: 04/05/2021 19:53    DISCHARGE EXAMINATION: Vitals:   04/06/21 0500 04/06/21 0510 04/06/21 0541 04/06/21 1247  BP: 117/74  125/77 130/73  Pulse: 71  75 (!) 58  Resp: 19  20 18   Temp:  98.2 F (  36.8 C) 99.1 F (37.3 C) 98.7 F (37.1 C)  TempSrc:  Oral Oral   SpO2: 92%  95% 98%  Weight:   69.4 kg   Height:   5\' 10"  (1.778 m)    General appearance: Awake alert.  In no distress Resp: Clear to auscultation bilaterally.  Normal effort Cardio: S1-S2 is normal regular.  No S3-S4.  No rubs murmurs or  bruit GI: Abdomen is soft.  Nontender nondistended.  Bowel sounds are present normal.  No masses organomegaly    DISPOSITION: Home  Discharge Instructions     Call MD for:  difficulty breathing, headache or visual disturbances   Complete by: As directed    Call MD for:  extreme fatigue   Complete by: As directed    Call MD for:  persistant dizziness or light-headedness   Complete by: As directed    Call MD for:  persistant nausea and vomiting   Complete by: As directed    Call MD for:  severe uncontrolled pain   Complete by: As directed    Call MD for:  temperature >100.4   Complete by: As directed    Diet - low sodium heart healthy   Complete by: As directed    Discharge instructions   Complete by: As directed    Please take your medications as prescribed.  Your symptoms were thought to be due to COVID-19 rather than a mini stroke or actual stroke although it is impossible to rule out a mini stroke for sure.  Seek attention if your symptoms recur.    You were cared for by a hospitalist during your hospital stay. If you have any questions about your discharge medications or the care you received while you were in the hospital after you are discharged, you can call the unit and asked to speak with the hospitalist on call if the hospitalist that took care of you is not available. Once you are discharged, your primary care physician will handle any further medical issues. Please note that NO REFILLS for any discharge medications will be authorized once you are discharged, as it is imperative that you return to your primary care physician (or establish a relationship with a primary care physician if you do not have one) for your aftercare needs so that they can reassess your need for medications and monitor your lab values. If you do not have a primary care physician, you can call [phone] for a physician referral.   Increase activity slowly   Complete by: As directed            Allergies as of 04/06/2021       Reactions   Aleve [naproxen Sodium] Nausea And Vomiting, Other (See Comments)   No problem with other NSAIDs   Amoxicillin Other (See Comments)   Vertigo Did it involve swelling of the face/tongue/throat, SOB, or low BP? No Did it involve sudden or severe rash/hives, skin peeling, or any reaction on the inside of your mouth or nose? No Did you need to seek medical attention at a hospital or doctor's office? unknown When did it last happen?   yrs ago    dizziness If all above answers are "NO", may proceed with cephalosporin use.   Gabapentin Other (See Comments)   Might be sensitive to this- took 100 mg twice within a couple of hours = Ended up falling from a standing position and had to be taken to the E.D.   Morphine And Related Other (See Comments)  Dizziness   Penicillins Diarrhea, Other (See Comments)   Diarrhea Did it involve swelling of the face/tongue/throat, SOB, or low BP? No Did it involve sudden or severe rash/hives, skin peeling, or any reaction on the inside of your mouth or nose? No Did you need to seek medical attention at a hospital or doctor's office? Unknown When did it last happen?    years ago   If all above answers are "NO", may proceed with cephalosporin use.        Medication List     TAKE these medications    ALPRAZolam 0.5 MG tablet Commonly known as: XANAX Take 1 tablet (0.5 mg total) by mouth at bedtime as needed. for sleep What changed:  reasons to take this additional instructions   aspirin 81 MG tablet Take 81 mg by mouth daily.   budesonide 3 MG 24 hr capsule Commonly known as: ENTOCORT EC Take 6 mg by mouth every morning.   carvedilol 3.125 MG tablet Commonly known as: COREG TAKE 1 TABLET BY MOUTH TWICE A DAY WITH MEALS What changed: when to take this   cholestyramine 4 GM/DOSE powder Commonly known as: QUESTRAN USE 1 SCOOP 3 TIMES A DAY WITH A MEAL What changed: See the new  instructions.   [employer] Fish Oil 1200 MG Caps Take 1,200 mg by mouth daily with breakfast.   fluorouracil 5 % cream Commonly known as: EFUDEX Apply 1 application topically daily as needed (as directed- affected sites).   gabapentin 100 MG capsule Commonly known as: NEURONTIN Take 1 capsule (100 mg total) by mouth 3 (three) times daily as needed (nerve pain).   losartan 25 MG tablet Commonly known as: COZAAR TAKE 1 TABLET BY MOUTH DAILY. PLEASE KEEP UPCOMING APPT FOR FUTURE REFILLS THANK YOU What changed: See the new instructions.   nitroGLYCERIN 0.4 MG SL tablet Commonly known as: NITROSTAT Place 1 tablet (0.4 mg total) under the tongue every 5 (five) minutes x 3 doses as needed for chest pain.   Paxlovid (300/100) 20 x 150 MG & 10 x 100MG  Tbpk Generic drug: nirmatrelvir & ritonavir Take 1 tablet by mouth 2 (two) times daily for 5 days.   PRESERVISION AREDS PO Take 1 capsule by mouth 2 (two) times daily.   rosuvastatin 20 MG tablet Commonly known as: CRESTOR Take 1 tablet (20 mg total) by mouth daily. What changed: when to take this          Follow-up Information     Susy Frizzle, MD. Schedule an appointment as soon as possible for a visit in 1 week(s).   Specialty: Family Medicine Contact information: [address] [place] [address] [phone]                 TOTAL DISCHARGE TIME: 69 minutes  [place]  [facility] Pager on [email or web address]  04/07/2021, 11:53 AM

## 2021-04-06 NOTE — Evaluation (Signed)
Physical Therapy Evaluation & Discharge Patient Details Name: Eugene [name] MRN: [id number] DOB: 10-23-1931 Today's Date: 04/06/2021  History of Present Illness  88 y/o male presented to ED on 04/05/21 with episode of L sided weakness and slurred speech after sliding out of chair. CTH negaitve. EEG suggestive of encephalopathy. 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 COVID+. PMH: R BKA, CAD, ischemic cardiomyopathy wiht chronic combined CHF.  Clinical Impression  Patient admitted with above diagnosis. Patient functioning at modI level with prosthetic on R and no AD. Patient and son-in-law stating he is at his baseline for mobility and cognition. No further skilled PT needs identified acutely. No PT follow up recommended at this time. PT will sign off.      Recommendations for follow up therapy are one component of a multi-disciplinary discharge planning process, led by the attending physician.  Recommendations may be updated based on patient status, additional functional criteria and insurance authorization.  Follow Up Recommendations No PT follow up    Assistance Recommended at Discharge None  Patient can return home with the following       Equipment Recommendations None recommended by PT  Recommendations for Other Services       Functional Status Assessment Patient has not had a recent decline in their functional status     Precautions / Restrictions Precautions Precautions: None Precaution Comments: hx of R BKA with prosthetic, COVID+ Restrictions Weight Bearing Restrictions: No      Mobility  Bed Mobility               General bed mobility comments: in bathroom with NT on arrival    Transfers Overall transfer level: Modified independent Equipment used: None               General transfer comment: use of momentum    Ambulation/Gait Ambulation/Gait assistance: Modified independent  (Device/Increase time) Gait Distance (Feet): 60 Feet Assistive device: None Gait Pattern/deviations: WFL(Within Functional Limits)   Gait velocity interpretation: >4.37 ft/sec, indicative of normal walking speed   General Gait Details: ambulating at baseline without AD  Stairs            Wheelchair Mobility    Modified Rankin (Stroke Patients Only)       Balance Overall balance assessment: No apparent balance deficits (not formally assessed)                                           Pertinent Vitals/Pain Pain Assessment Pain Assessment: No/denies pain    Home Living Family/patient expects to be discharged to:: Private residence Living Arrangements: Spouse/significant other Available Help at Discharge: Family Type of Home: House Home Access: Stairs to enter   Technical brewer of Steps: 3   Home Layout: One level Home Equipment: Conservation officer, nature (2 wheels)      Prior Function Prior Level of Function : Independent/Modified Independent;Driving             Mobility Comments: uses RW at night when he doesn't have prosthetic on       Hand Dominance        Extremity/Trunk Assessment   Upper Extremity Assessment Upper Extremity Assessment: Overall WFL for tasks assessed    Lower Extremity Assessment Lower Extremity Assessment: Overall WFL for tasks assessed    Cervical / Trunk Assessment Cervical / Trunk  Assessment: Normal  Communication   Communication: No difficulties  Cognition Arousal/Alertness: Awake/alert Behavior During Therapy: WFL for tasks assessed/performed Overall Cognitive Status: Within Functional Limits for tasks assessed                                          General Comments      Exercises     Assessment/Plan    PT Assessment Patient does not need any further PT services  PT Problem List         PT Treatment Interventions      PT Goals (Current goals can be found in the Care  Plan section)  Acute Rehab PT Goals Patient Stated Goal: to get out of here PT Goal Formulation: All assessment and education complete, DC therapy    Frequency       Co-evaluation               AM-PAC PT "6 Clicks" Mobility  Outcome Measure Help needed turning from your back to your side while in a flat bed without using bedrails?: None Help needed moving from lying on your back to sitting on the side of a flat bed without using bedrails?: None Help needed moving to and from a bed to a chair (including a wheelchair)?: None Help needed standing up from a chair using your arms (e.g., wheelchair or bedside chair)?: None Help needed to walk in hospital room?: None Help needed climbing 3-5 steps with a railing? : None 6 Click Score: 24    End of Session   Activity Tolerance: Patient tolerated treatment well Patient left: in chair;with call bell/phone within reach;with family/visitor present Nurse Communication: Mobility status PT Visit Diagnosis: Muscle weakness (generalized) (M62.81)    Time: 2025-4270 PT Time Calculation (min) (ACUTE ONLY): 20 min   Charges:   PT Evaluation $PT Eval Low Complexity: 1 Low          Jasleen Riepe A. Gilford Rile PT, DPT Acute Rehabilitation Services Pager [phone] Office [phone]   Linna Hoff 04/06/2021, 8:47 AM

## 2021-04-06 NOTE — Progress Notes (Signed)
SLP Cancellation Note  Patient Details Name: Eugene [name] MRN: [id number] DOB: Jul 17, 1931   Cancelled treatment:       Reason Eval/Treat Not Completed: SLP screened, no needs identified, will sign off. Per PT pt appears to be at baseline - family also confirmed, and pt with negative MRI.  Jerrian Mells MA, CCC-SLP  Mallary Kreger Meryl 04/06/2021, 10:05 AM

## 2021-04-06 NOTE — Progress Notes (Signed)
° °  Echocardiogram 2D Echocardiogram has been performed.  Eugene [name] 04/06/2021, 12:04 PM

## 2021-04-06 NOTE — Progress Notes (Signed)
OT Cancellation Note  Patient Details Name: Eugene [name] MRN: [id number] DOB: March 27, 1931   Cancelled Treatment:    Reason Eval/Treat Not Completed: OT screened, no needs identified, will sign off.  Per PT pt appears to be at baseline - family also confirmed, and pt with negative MRI.  Nilsa Nutting., OTR/L Acute Rehabilitation Services Pager [phone] Office [phone]   Lucille Passy M 04/06/2021, 9:25 AM

## 2021-04-06 NOTE — ED Notes (Signed)
Pt to MRI

## 2021-04-06 NOTE — Progress Notes (Addendum)
STROKE TEAM PROGRESS NOTE   ATTENDING NOTE: I reviewed above note and agree with the assessment and plan. Pt was seen and examined.   88 year old male with history of hyperlipidemia, CAD/MI, PVD status post right BKA, CHF/cardiomyopathy admitted for fall with questionable left-sided weakness.  For detailed questioning, daughter stated that patient had phantom right leg pain and he took his wife's gabapentin 20 mg for the first time and then he fell from his chair and will get back up by himself.  He was sent to ED and later found to have COVID positive with fever T 101.4.  Work-up all negative, no focal deficit at this time.  Not sure if he does have focal deficit at the time onset but certainly COVID infection in combination of first time gabapentin use causing drowsy sleepy which likely explaining his symptoms of falling and not up to get back up.   CT no acute abnormality.  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  Bilateral siphon mild to moderate stenosis.  EEG no seizure.  MRI no acute infarct.  EF 55%.  LDL 51, A1c pending.  Creatinine 0.90.  Currently afebrile.  Blood culture pending.  On exam, patient neurologically intact, no focal deficit.  Etiology for patient symptoms concerning for acute encephalopathy in the setting of symptomatic COVID infection and first-time use of the gabapentin, which caused falling and not able to get back up by himself.  TIA considered less likely.  Recommend to continue aspirin 81 and Crestor 20 home medication.  COVID management per primary team.  PT/OT no recommendation.  For detailed assessment and plan, please refer to above as I have made changes wherever appropriate.   Neurology will sign off. Please call with questions. Thanks for the consult.   Rosalin Hawking, MD PhD Stroke Neurology 04/06/2021 2:05 PM  INTERVAL HISTORY Patient is seen in his room with his son in law at the bedside.  Yesterday, he presented  with a transient episode of left sided weakness which caused him to slide out of his chair at home.  He was unaware of these deficits during the event.  MRI shows no acute abnormalities, and patient will undergo workup for a TIA.  Vitals:   04/06/21 0400 04/06/21 0500 04/06/21 0510 04/06/21 0541  BP: 128/60 117/74  125/77  Pulse: 72 71  75  Resp: (!) 22 19  20   Temp:   98.2 F (36.8 C) 99.1 F (37.3 C)  TempSrc:   Oral Oral  SpO2: 93% 92%  95%  Weight:    69.4 kg  Height:    5\' 10"  (1.778 m)   CBC:  Recent Labs  Lab 04/05/21 1921 04/05/21 1926 04/06/21 0340  WBC 6.8  --  5.8  NEUTROABS 4.2  --  2.9  HGB 14.4 14.3 13.8  HCT 44.0 42.0 40.9  MCV 102.3*  --  98.3  PLT 142*  --  026   Basic Metabolic Panel:  Recent Labs  Lab 04/05/21 1921 04/05/21 1926 04/06/21 0340  NA 133* 133* 133*  K 3.6 3.6 3.4*  CL 100 101 104  CO2 22  --  23  GLUCOSE 92 91 87  BUN 12 12 9   CREATININE 0.95 0.90 0.96  CALCIUM 8.5*  --  8.1*   Lipid Panel:  Recent Labs  Lab 04/06/21 0340  CHOL 117  TRIG 40  HDL 58  CHOLHDL 2.0  VLDL 8  LDLCALC 51   HgbA1c: No results  for input(s): HGBA1C in the last 168 hours. Urine Drug Screen: No results for input(s): LABOPIA, COCAINSCRNUR, LABBENZ, AMPHETMU, THCU, LABBARB in the last 168 hours.  Alcohol Level No results for input(s): ETH in the last 168 hours.  IMAGING past 24 hours MR BRAIN WO CONTRAST  Result Date: 04/06/2021 CLINICAL DATA:  Initial evaluation for acute stroke. EXAM: MRI HEAD WITHOUT CONTRAST TECHNIQUE: Multiplanar, multiecho pulse sequences of the brain and surrounding structures were obtained without intravenous contrast. COMPARISON:  Prior CTs from 04/05/2021. FINDINGS: Brain: Cerebral volume within normal limits. Mild chronic microvascular ischemic disease for age. Small remote right cerebellar infarct noted. No evidence for acute or subacute ischemia. Gray-white matter differentiation maintained. No other areas of remote cortical  infarction. No acute intracranial hemorrhage. Single chronic microhemorrhage noted within the right frontal corona radiata, of doubtful significance in isolation. No mass lesion, midline shift or mass effect. No hydrocephalus or extra-axial fluid collection. Pituitary gland suprasellar region normal. Midline structures intact. Vascular: Absent flow void within the right vertebral artery, consistent with previously identified occlusion. Major intracranial vascular flow voids are otherwise maintained. Skull and upper cervical spine: Craniocervical junction within normal limits. Bone marrow signal intensity normal. No scalp soft tissue abnormality. Sinuses/Orbits: Prior bilateral ocular lens replacement. Moderate mucosal thickening present throughout the ethmoidal air cells and maxillary sinuses. No mastoid effusion. Other: None. IMPRESSION: 1. No acute intracranial abnormality. 2. Underlying mild chronic microvascular ischemic disease for age with small remote right cerebellar infarct. 3. Absent flow void within the right vertebral artery, consistent with occlusion as seen on prior CTA. Electronically Signed   By: Jeannine Boga M.D.   On: 04/06/2021 03:09   DG Chest Portable 1 View  Result Date: 04/05/2021 CLINICAL DATA:  Altered mental status, status post fall. EXAM: PORTABLE CHEST 1 VIEW COMPARISON:  August 21, 2020 FINDINGS: The heart size and mediastinal contours are within normal limits. A coronary artery stent is seen. Low lung volumes are noted with mild left basilar atelectasis. There is no evidence of a pleural effusion or pneumothorax. The visualized skeletal structures are unremarkable. IMPRESSION: Low lung volumes with mild left basilar atelectasis. Electronically Signed   By: Virgina Norfolk M.D.   On: 04/05/2021 21:42   EEG adult  Result Date: 04/06/2021 [name], [name], MD     04/06/2021  7:38 AM [facility] Routine EEG Report Patient Name:   Eugene [name], Eugene [name]  Date of Birth:   15-Sep-1931 Identification Number:   MRN - [id number] Date of Study:   04/05/2021 21:43:47 Indication: Encephalopathy, Technical Summary: A routine 20 channel electroencephalogram using the international 10-20 system of electrode placement was performed. Background: 7-8 Hz, Posterior dominant rhythm that attenuated with eye Opening States      Awake      Drowsy: were seen during drowsiness Abnormalities Generalized Slowing: Diffuse generalized slowing Background Slowing: The background consists of 20-50 uV, 7-8 Hz diffuse activity with superimposed diffuse polymorphic delta activity that is non reactive to external stimulation. Activation Procedures Hyperventilation: Not performed Photic Stimulation: Classification: Abnormal : Diagnosis: This is abnormal EEG, the Presence of Generalized slowing is consistent with Encephalopathy Dr Hinda Lenis [name] [facility] [phone] Case [id number]  CT HEAD CODE STROKE WO CONTRAST  Result Date: 04/05/2021 CLINICAL DATA:  Code stroke.  Left weakness EXAM: CT HEAD WITHOUT CONTRAST TECHNIQUE: Contiguous axial images were obtained from the base of the skull through the vertex without intravenous contrast. RADIATION DOSE REDUCTION: This exam was performed according to the departmental dose-optimization program which  includes automated exposure control, adjustment of the mA and/or kV according to patient size and/or use of iterative reconstruction technique. COMPARISON:  None. FINDINGS: Brain: No evidence of acute infarction, hemorrhage, cerebral edema, mass, mass effect, or midline shift. Ventricles and sulci are normal for age. No extra-axial fluid collection. Periventricular white matter changes, likely the sequela of chronic small vessel ischemic disease. Degree of cerebral volume loss is within normal limits for age. Vascular: No hyperdense vessel or unexpected calcification. Skull: Normal. Negative for fracture or focal lesion. Sinuses/Orbits:  Mucosal thickening in the left maxillary sinus and ethmoid air cells. Status post bilateral lens replacements. Other: The mastoid air cells are well aerated. ASPECTS [facility] Stroke Program Early CT Score) - Ganglionic level infarction (caudate, lentiform nuclei, internal capsule, insula, M1-M3 cortex): 7 - Supraganglionic infarction (M4-M6 cortex): 3 Total score (0-10 with 10 being normal): 10 IMPRESSION: 1. No acute intracranial process. 2. ASPECTS is 10 Code stroke imaging results were communicated on 04/05/2021 at 7:30 pm to provider Dr. Leonel Ramsay via secure text paging. Electronically Signed   By: Merilyn Baba M.D.   On: 04/05/2021 19:30   CT ANGIO HEAD NECK W WO CM (CODE STROKE)  Result Date: 04/05/2021 CLINICAL DATA:  Initial evaluation for acute stroke. EXAM: CT ANGIOGRAPHY HEAD AND NECK TECHNIQUE: Multidetector CT imaging of the head and neck was performed using the standard protocol during bolus administration of intravenous contrast. Multiplanar CT image reconstructions and MIPs were obtained to evaluate the vascular anatomy. Carotid stenosis measurements (when applicable) are obtained utilizing NASCET criteria, using the distal internal carotid diameter as the denominator. RADIATION DOSE REDUCTION: This exam was performed according to the departmental dose-optimization program which includes automated exposure control, adjustment of the mA and/or kV according to patient size and/or use of iterative reconstruction technique. CONTRAST:  28mL OMNIPAQUE IOHEXOL 350 MG/ML SOLN COMPARISON:  Prior head CT from earlier the same day. FINDINGS: CTA NECK FINDINGS Aortic arch: Visualized aortic arch normal caliber with normal branch pattern. Mild-to-moderate atheromatous change about the arch itself. No hemodynamically significant stenosis about the origin the great vessels. Right carotid system: Right common and internal carotid arteries patent without stenosis or dissection. Mild eccentric plaque about the  right carotid bulb without stenosis. Left carotid system: Left common and internal carotid arteries patent without stenosis or dissection. Mild atheromatous plaque about the left carotid bulb without stenosis. Vertebral arteries: Both vertebral arteries arise from subclavian arteries. Focal 65% atheromatous stenosis noted at the origin of the right subclavian artery (series 6, image 149). Left vertebral artery strongly dominant and widely patent within the neck. Focal severe ostial stenosis noted at the origin of the hypoplastic right vertebral artery. Right vertebral artery patent proximally, but becomes increasingly attenuated as it courses cephalad within the neck, and occludes by the skull base. Skeleton: No discrete or worrisome osseous lesions. Moderate multilevel cervical spondylosis at C3-4 through C6-7. Other neck: No other acute soft tissue abnormality within the neck. Upper chest: Visualized upper chest demonstrates no acute finding. Review of the MIP images confirms the above findings CTA HEAD FINDINGS Anterior circulation: Petrous segments patent bilaterally. Scattered atheromatous change within the carotid siphons with associated mild to moderate multifocal narrowing. A1 segments patent bilaterally. Normal anterior communicating artery complex. Anterior cerebral arteries patent without stenosis. No M1 stenosis or occlusion. Normal MCA bifurcations. Distal MCA branches well perfused and symmetric. Posterior circulation: Dominant left vertebral artery widely patent to the vertebrobasilar junction. Right vertebral artery occluded. Neither PICA well visualized. Basilar patent to  its distal aspect without stenosis. Superior cerebellar arteries patent bilaterally. Both PCAs primarily supplied via the basilar well perfused or distal aspects. Venous sinuses: Patent allowing for timing the contrast bolus. Anatomic variants: Strongly dominant left vertebral artery. No aneurysm. Review of the MIP images confirms  the above findings IMPRESSION: 1. Negative CTA for emergent large vessel occlusion. 2. 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 3. 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 Right vertebral artery becomes increasingly attenuated as it courses cephalad within the neck, and occludes by the skull base. Dominant left vertebral artery widely patent. 4. Atheromatous change about the carotid siphons with associated mild to moderate multifocal narrowing. These results were communicated to Dr. Leonel Ramsay at 7:49 pm on 04/05/2021 by text page via the [facility] messaging system Electronically Signed   By: Jeannine Boga M.D.   On: 04/05/2021 19:53    PHYSICAL EXAM General:  Alert, well-developed, well nourished patient in no acute distress  Respiratory:  Normal rate and effort of breathing   NEURO:  Mental Status: AA&Ox3  Speech/Language: speech is without dysarthria or aphasia.    Cranial Nerves:  II: PERRL. Visual fields full.  III, IV, VI: EOMI. Eyelids elevate symmetrically.  V: Sensation is intact to light touch and symmetrical to face.  VII: Very slight right facial droop without flattening of nasolabial fold (patient and family believe this may be baseline) VIII: hearing intact to voice. IX, X: Phonation is normal.  XII: tongue is midline without fasciculations. Motor: 5/5 strength to all muscle groups tested.  Tone: is normal and bulk is normal Sensation- Intact to light touch bilaterally.   Coordination: FTN intact bilaterally.No drift.  Gait- deferred   ASSESSMENT/PLAN Eugene [name] is a 88 y.o. male with history of HLD, right BKA and ischemic cardiomyopathy presenting with a episode of falling out of chair at home, not able to get up by himself.  He was unaware of these deficits during the event.  He was found to have COVID and took his wife's gabapentin 200mg  for the first time for his right foot phantom  pain. MRI shows no acute abnormalities, and patient will undergo workup for a TIA.  Likely encephalopathy causing fall in combination of COVID and first time gabapentin use. TIA less likely Pt had fever with COVID positive test He took his wife's gabapentin 200mg  for the first time before this happened  Seems to be drowsy sleepy after gabapentin use with fall Code Stroke CT head No acute abnormality. ASPECTS 10.    CTA head & neck Negative for LVO, occluded right vertebral artery with left widely patent, atheromatous change at carotid siphons MRI  No acute abnormality, mild chrnic microvascular ischemic disease 2D Echo EF 55% LDL 51 HgbA1c pending. VTE prophylaxis - lovenox aspirin 81 mg daily prior to admission, now on aspirin 81 mg daily. Continue on discharge. Therapy recommendations:  none Disposition:  home today  COVID Positive COVID PCR Fever resolved On paxlovid Management per primary team  Hypertension Home meds:  losartan 25 mg daily Stable Long-term BP goal normotensive  Hyperlipidemia Home meds:  rosuvastatin 20 mg daily, resumed in hospital LDL 51, goal < 70 Continue statin at discharge  Other Stroke Risk Factors Advanced Age >/= 46  Former cigarette smoker Ischemic cardiomyopathy  Other Active Problems Chronic right foot phantom pain  Side effect from gabapentin   Hospital day # [place] ,  MSN, AGACNP-BC [facility] See Amion for schedule and pager information 04/06/2021 11:38 AM        To contact Stroke Continuity provider, please refer to [email or web address] After hours, contact General Neurology

## 2021-04-06 NOTE — Progress Notes (Signed)
PT avs reviewed and pt verbalized understanding of all DC teaching. Pt has all pt belongings in his possession and will be going home with family as transporation.

## 2021-04-06 NOTE — Procedures (Signed)
° °  [facility]  Routine EEG Report  Patient Name:   Eugene [name], Eugene [name] Date of Birth:   [birth year] Identification Number:   MRN - [id number]  Date of Study:   04/05/2021 21:43:47  Indication: Encephalopathy,  Technical Summary: A routine 20 channel electroencephalogram using the international 10-20 system of electrode placement was performed.  Background: 7-8 Hz, Posterior dominant rhythm that attenuated with eye Opening  States       Awake      Drowsy: were seen during drowsiness  Abnormalities Generalized Slowing: Diffuse generalized slowing Background Slowing: The background consists of 20-50 uV, 7-8 Hz diffuse activity with superimposed diffuse polymorphic delta activity that is non reactive to external stimulation.   Activation Procedures  Hyperventilation: Not performed  Photic Stimulation:  Classification: Abnormal :  Diagnosis: This is abnormal EEG, the Presence of Generalized slowing is consistent with Encephalopathy      Dr Hinda Lenis Terrill Alperin   [facility] [phone]  Case [id number]

## 2021-04-07 LAB — URINE CULTURE: Culture: <10000 — AB

## 2021-04-08 ENCOUNTER — Telehealth: Payer: Self-pay

## 2021-04-08 LAB — HEMOGLOBIN A1C
Hgb A1c MFr Bld: 5.9 % — ABNORMAL HIGH (ref 4.8–5.6)
Mean Plasma Glucose: 123 mg/dL

## 2021-04-08 NOTE — Telephone Encounter (Signed)
Transition Care Management Unsuccessful Follow-up Telephone Call  Date of discharge and from where:  04/06/21 - [place] - TIA/ Covid  Attempts:  1st Attempt  Reason for unsuccessful TCM follow-up call:  Left voice message

## 2021-04-09 ENCOUNTER — Telehealth: Payer: Self-pay

## 2021-04-09 NOTE — Telephone Encounter (Signed)
Transition Care Management Follow-up Telephone Call Date of discharge and from where: 04/06/21 Eugene [name] Diagnosis: TIA How have you been since you were released from the hospital? Pt states he is doing much better.  Any questions or concerns? No  Items Reviewed: Did the pt receive and understand the discharge instructions provided? Yes  Medications obtained and verified? Yes  Other? No  Any new allergies since your discharge? No  Dietary orders reviewed? Yes Do you have support at home? Yes   Home Care and Equipment/Supplies: Were home health services ordered? not applicable If so, what is the name of the agency? N/A  Has the agency set up a time to come to the patient's home? not applicable Were any new equipment or medical supplies ordered?  No What is the name of the medical supply agency? N/A Were you able to get the supplies/equipment? not applicable Do you have any questions related to the use of the equipment or supplies? No  Functional Questionnaire: (I = Independent and D = Dependent) ADLs: I  Bathing/Dressing- I  Meal Prep- I  Eating- I  Maintaining continence- I  Transferring/Ambulation- I  Managing Meds- I  Follow up appointments reviewed:  PCP Hospital f/u appt confirmed? Yes  Scheduled to see Dr. Dennard Schaumann on 04/12/21 @ 12:00. [facility] f/u appt confirmed?  N/A   Are transportation arrangements needed? No  If their condition worsens, is the pt aware to call PCP or go to the Emergency Dept.? Yes Was the patient provided with contact information for the PCP's office or ED? Yes Was to pt encouraged to call back with questions or concerns? Yes

## 2021-04-11 ENCOUNTER — Other Ambulatory Visit: Payer: Self-pay | Admitting: Cardiovascular Disease

## 2021-04-11 LAB — CULTURE, BLOOD (ROUTINE X 2)
Culture: NO GROWTH
Culture: NO GROWTH
Special Requests: ADEQUATE
Special Requests: ADEQUATE

## 2021-04-12 ENCOUNTER — Encounter: Payer: Self-pay | Admitting: Family Medicine

## 2021-04-12 ENCOUNTER — Ambulatory Visit (INDEPENDENT_AMBULATORY_CARE_PROVIDER_SITE_OTHER): Payer: PPO | Admitting: Family Medicine

## 2021-04-12 ENCOUNTER — Other Ambulatory Visit: Payer: Self-pay

## 2021-04-12 VITALS — BP 118/68 | HR 64 | Temp 96.9°F | Resp 18 | Ht 70.0 in | Wt 149.0 lb

## 2021-04-12 DIAGNOSIS — U071 COVID-19: Secondary | ICD-10-CM

## 2021-04-12 DIAGNOSIS — G546 Phantom limb syndrome with pain: Secondary | ICD-10-CM

## 2021-04-12 DIAGNOSIS — T50905D Adverse effect of unspecified drugs, medicaments and biological substances, subsequent encounter: Secondary | ICD-10-CM

## 2021-04-12 NOTE — Progress Notes (Signed)
Subjective:    Patient ID: Eugene [name], male    DOB: December 28, 1931, 88 y.o.   MRN: [id number]  HPI Admit date: 04/05/2021 Discharge date: 04/06/2021     PCP: Eugene Frizzle, MD   DISCHARGE DIAGNOSES:  Generalized weakness likely secondary to COVID-19 COVID-19 infection Hyperlipidemia Chronic diastolic CHF, grade 2 diastolic dysfunction noted on echocardiogram     RECOMMENDATIONS FOR OUTPATIENT FOLLOW UP: Follow-up with primary care provider in 1 week     Home Health: None Equipment/Devices: None   CODE STATUS: Full code   DISCHARGE CONDITION: fair   Diet recommendation: As before   INITIAL HISTORY: As per H&P: "Eugene [name] is a 88 y.o. 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 Pt presents to ED with transient episode of L sided weakness. No LOC.  States that he suddenly couldn't get up and slid out of chair and ended up on ground. The patient does not feel that he ever lost consciousness.  His niece arrived about 5 minutes after the event, and found him on the ground, trying to get up.  Once EMS arrived and they stood him up, it was noted that he had significant left-sided weakness.  Prior to transfer him to the stretcher, however, his deficits rapidly improved."   Consultations: Neurology   Procedures: Transthoracic echocardiogram   HOSPITAL COURSE:    Patient admitted after he had a fall at home.  There was initially some concern for left-sided weakness.  But this history was never truly corroborated by family.  Patient was found to be positive for COVID-19 and was noted to be febrile.  It was felt that his weakness was likely due to acute infection.  Work-up in the hospital has been unremarkable.  MRI brain did not show any acute findings.  CT angiogram head and neck showed no emergent large vessel occlusion.  65% stenosis at the origin of the right subclavian artery.  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  Left vertebral artery was widely patent.  2D echocardiogram showed normal systolic function with grade 2 diastolic dysfunction.  Noted to be euvolemic.  Plan is to continue with aspirin and statin.   Patient was found to be incidentally positive for COVID-19.  Apparently his wife also has acute infection.  He was afebrile during this hospital stay.  However he did not have any dyspnea.  Chest x-ray did not show any opacities.  Patient was started on Paxlovid and will be discharged on the same.  No indication for steroids.  He was saturating normal on room air.   Potassium to be repleted prior to discharge.  Patient was seen by physical therapy and no needs were identified.   All of the above was discussed with patient and his son-in-law.  Discussed also with neurology.    04/12/21 Patient is here today for follow-up.  He looks much better.  He states that he feels weak.  Attributes this to the [place].  He has just finished Paxlovid.  He denies any chest pain shortness of breath or dyspnea on exertion.  He denies any pleurisy.  He denies any dizziness or syncope or falls or near syncope.  I had seen the patient the day he went to the emergency room and prescribed gabapentin 100 mg tablets he taken up to 3 times a day for nerve pain/phantom limb pain.  The patient took 200  mg of gabapentin when he got home.  At the same time he was developing a fever from [place].  He then fell and lost his balance and was too weak to get up.  This prompted EMS to be contacted.  I believe that the patient likely had a fever and weakness due to COVID compounded by taking 200 mg of gabapentin.  We discussed TIA.  The patient denies any other neurologic deficits since he left the hospital.  Work-up in the hospital included a normal MRI, occlusion of the right vertebral artery but collateral flow compensating from the left vertebral artery.  Past Medical History:   Diagnosis Date   Amputated right leg ([place])    CAD (coronary artery disease)    a. 09/2017 - acute MI -  emergent cath showing 99% stenosis in midLAD with thrombotic stenosis at the bifurcation of the second diagonal, which was relatively small in diameter. He underwent placement of DES to midLAD which jailed the second diagonal.    Chronic combined systolic and diastolic heart failure ([place])    Collagenous colitis    Dr. Festus Holts   History of ST elevation myocardial infarction (STEMI)    09/2017: LAD   History of stomach ulcers    Hyperlipidemia LDL goal <70    Ischemic cardiomyopathy    Squamous cell carcinoma of skin 03/27/2014   in situ-left temple (txpbx)   Squamous cell carcinoma of skin 02/24/2018   in situ-left cheek (CX35FU)   Past Surgical History:  Procedure Laterality Date   BACK SURGERY     CARDIAC CATHETERIZATION     CORONARY STENT INTERVENTION N/A 10/07/2017   Procedure: CORONARY STENT INTERVENTION;  Surgeon: Eugene Hampshire, MD;  Location: [facility];  Service: Cardiovascular;  Laterality: N/A;   CORONARY/GRAFT ACUTE MI REVASCULARIZATION N/A 10/07/2017   Procedure: Coronary/Graft Acute MI Revascularization;  Surgeon: Eugene Hampshire, MD;  Location: [facility];  Service: Cardiovascular;  Laterality: N/A;   LEFT HEART CATH AND CORONARY ANGIOGRAPHY N/A 10/07/2017   Procedure: LEFT HEART CATH AND CORONARY ANGIOGRAPHY;  Surgeon: Eugene Hampshire, MD;  Location: [facility];  Service: Cardiovascular;  Laterality: N/A;   LEG AMPUTATION     Right Below knee   STOMACH SURGERY     Billroth II gastrojejunostomy   TOTAL KNEE ARTHROPLASTY     Current Outpatient Medications on File Prior to Visit  Medication Sig Dispense Refill   ALPRAZolam (XANAX) 0.5 MG tablet Take 1 tablet (0.5 mg total) by mouth at bedtime as needed. for sleep (Patient taking differently: Take 0.5 mg by mouth at bedtime as needed for sleep.) 30 tablet 0   aspirin 81 MG tablet Take  81 mg by mouth daily.     budesonide (ENTOCORT EC) 3 MG 24 hr capsule Take 6 mg by mouth every morning.     carvedilol (COREG) 3.125 MG tablet TAKE 1 TABLET BY MOUTH TWICE A DAY WITH MEALS 180 tablet 1   cholestyramine (QUESTRAN) 4 GM/DOSE powder USE 1 SCOOP 3 TIMES A DAY WITH A MEAL (Patient taking differently: Take 4 g by mouth daily as needed (for colitis flares (mix and drink)).) 756 g 6   fluorouracil (EFUDEX) 5 % cream Apply 1 application topically daily as needed (as directed- affected sites).     gabapentin (NEURONTIN) 100 MG capsule Take 1 capsule (100 mg total) by mouth 3 (three) times daily as needed (nerve pain). 90 capsule 3   losartan (COZAAR) 25 MG tablet  TAKE 1 TABLET BY MOUTH DAILY. PLEASE KEEP UPCOMING APPT FOR FUTURE REFILLS THANK YOU (Patient taking differently: Take 25 mg by mouth in the morning.) 30 tablet 11   Multiple Vitamins-Minerals (PRESERVISION AREDS PO) Take 1 capsule by mouth 2 (two) times daily.     nitroGLYCERIN (NITROSTAT) 0.4 MG SL tablet Place 1 tablet (0.4 mg total) under the tongue every 5 (five) minutes x 3 doses as needed for chest pain. 25 tablet 3   Omega-3 Fatty Acids ([employer] FISH OIL) 1200 MG CAPS Take 1,200 mg by mouth daily with breakfast.     rosuvastatin (CRESTOR) 20 MG tablet Take 1 tablet (20 mg total) by mouth daily. (Patient taking differently: Take 20 mg by mouth in the morning.) 90 tablet 3   No current facility-administered medications on file prior to visit.   Allergies  Allergen Reactions   Aleve [Naproxen Sodium] Nausea And Vomiting and Other (See Comments)    No problem with other NSAIDs   Amoxicillin Other (See Comments)    Vertigo Did it involve swelling of the face/tongue/throat, SOB, or low BP? No Did it involve sudden or severe rash/hives, skin peeling, or any reaction on the inside of your mouth or nose? No Did you need to seek medical attention at a hospital or doctor's office? unknown When did it last happen?   yrs ago     dizziness If all above answers are "NO", may proceed with cephalosporin use.    Gabapentin Other (See Comments)    Might be sensitive to this- took 100 mg twice within a couple of hours = Ended up falling from a standing position and had to be taken to the E.D.   Morphine And Related Other (See Comments)    Dizziness    Penicillins Diarrhea and Other (See Comments)    Diarrhea Did it involve swelling of the face/tongue/throat, SOB, or low BP? No Did it involve sudden or severe rash/hives, skin peeling, or any reaction on the inside of your mouth or nose? No Did you need to seek medical attention at a hospital or doctor's office? Unknown When did it last happen?    years ago   If all above answers are "NO", may proceed with cephalosporin use.    Social History   Socioeconomic History   Marital status: Married    Spouse name: Financial risk analyst   Number of children: Not on file   Years of education: 12   Highest education level: High school graduate  Occupational History   Occupation: Retired  Tobacco Use   Smoking status: Former    Packs/day: 1.00    Years: 8.00    Pack years: 8.00    Types: Cigarettes   Smokeless tobacco: Never  Vaping Use   Vaping Use: Never used  Substance and Sexual Activity   Alcohol use: Yes    Comment: occasional   Drug use: No   Sexual activity: Yes    Comment: married  Other Topics Concern   Not on file  Social History Narrative   Not on file   Social Determinants of Health   Financial Resource Strain: Low Risk    Difficulty of Paying Living Expenses: Not hard at all  Food Insecurity: No Food Insecurity   Worried About Charity fundraiser in the Last Year: Never true   Arboriculturist in the Last Year: Never true  Transportation Needs: No Transportation Needs   Lack of Transportation (Medical): No   Lack of Transportation (Non-Medical):  No  Physical Activity: Inactive   Days of Exercise per Week: 0 days   Minutes of Exercise per Session: 0 min   Stress: No Stress Concern Present   Feeling of Stress : Not at all  Social Connections: Moderately Integrated   Frequency of Communication with Friends and Family: More than three times a week   Frequency of Social Gatherings with Friends and Family: More than three times a week   Attends Religious Services: More than 4 times per year   Active Member of [employer] or Organizations: No   Attends Archivist Meetings: Never   Marital Status: Married  Human resources officer Violence: Not At Risk   Fear of Current or Ex-Partner: No   Emotionally Abused: No   Physically Abused: No   Sexually Abused: No   Review of Systems  All other systems reviewed and are negative.     Objective:   Physical Exam Vitals reviewed.  HENT:     Right Ear: Tympanic membrane and ear canal normal.     Left Ear: Tympanic membrane and ear canal normal.     Nose: Nose normal. No congestion or rhinorrhea.  Cardiovascular:     Rate and Rhythm: Normal rate and regular rhythm.     Heart sounds: Normal heart sounds. No murmur heard.   No friction rub. No gallop.  Pulmonary:     Effort: Pulmonary effort is normal. No respiratory distress.     Breath sounds: Normal breath sounds. No wheezing or rales.  Abdominal:     General: Bowel sounds are normal. There is no distension.     Palpations: Abdomen is soft.     Tenderness: There is no abdominal tenderness.  Musculoskeletal:     Left lower leg: No edema.  Skin:    Coloration: Skin is not jaundiced.     Findings: No bruising or lesion.   Right below the knee amputation. Please see history of present illness for further information       Assessment & Plan:  Phantom limb pain ([place])  COVID  Adverse effect of drug, subsequent encounter I believe it was too much of a coincidence.  I believe that the reason the patient fell week was due to 200 mg of gabapentin coupled with fever due to COVID all occurring at the same time.  TIA is also on the differential  diagnosis.  We discussed switching from aspirin to Plavix however I believe it was more likely a medication reaction than a TIA.  Therefore we have decided not to use gabapentin, and to monitor for any recurrent neurologic deficits that would suggest recurrent TIA.  Patient seems to be recovering well from [place]

## 2021-04-22 ENCOUNTER — Telehealth: Payer: Self-pay | Admitting: Physician Assistant

## 2021-04-22 NOTE — Telephone Encounter (Signed)
Message for [place]. Becky "needs your help really bad." Karey has prosthetic leg, +new liner caused him to break out from the knee up; [employer], Staci Righter, wants [facility] to have to look + see if his leg is OK. No openings; what do you want to do with him?

## 2021-04-23 ENCOUNTER — Other Ambulatory Visit: Payer: Self-pay

## 2021-04-23 ENCOUNTER — Ambulatory Visit (INDEPENDENT_AMBULATORY_CARE_PROVIDER_SITE_OTHER): Payer: PPO | Admitting: Family Medicine

## 2021-04-23 ENCOUNTER — Encounter: Payer: Self-pay | Admitting: Family Medicine

## 2021-04-23 VITALS — BP 142/82 | HR 70 | Temp 97.4°F | Resp 18 | Ht 70.0 in | Wt 144.0 lb

## 2021-04-23 DIAGNOSIS — L249 Irritant contact dermatitis, unspecified cause: Secondary | ICD-10-CM

## 2021-04-23 MED ORDER — MOMETASONE FUROATE 0.1 % EX CREA: TOPICAL_CREAM | Freq: Every day | CUTANEOUS | 1 refills | Status: DC

## 2021-04-23 NOTE — Progress Notes (Signed)
Subjective:    Patient ID: Eugene [name], male    DOB: [birth year], 88 y.o.   MRN: [id number]  HPI Patient has a longstanding history of a right below the knee amputation.  He suffered this from a traumatic injury while cutting tree years ago.  As a result, he has to wear a below the knee prosthesis.  To wear this he has to put a neoprene liner against his skin.  Over the last couple weeks he has developed a rash.  Please see the photograph below  The rash is not warm or painful.  There are numerous erythematous papules.  It does not itch or burn Past Medical History:  Diagnosis Date   Amputated right leg ([facility])    CAD (coronary artery disease)    a. 09/2017 - acute MI -  emergent cath showing 99% stenosis in midLAD with thrombotic stenosis at the bifurcation of the second diagonal, which was relatively small in diameter. He underwent placement of DES to midLAD which jailed the second diagonal.    Chronic combined systolic and diastolic heart failure ([place])    Collagenous colitis    Dr. Festus Holts   History of ST elevation myocardial infarction (STEMI)    09/2017: LAD   History of stomach ulcers    Hyperlipidemia LDL goal <70    Ischemic cardiomyopathy    Squamous cell carcinoma of skin 03/27/2014   in situ-left temple (txpbx)   Squamous cell carcinoma of skin 02/24/2018   in situ-left cheek (CX35FU)   Past Surgical History:  Procedure Laterality Date   BACK SURGERY     CARDIAC CATHETERIZATION     CORONARY STENT INTERVENTION N/A 10/07/2017   Procedure: CORONARY STENT INTERVENTION;  Surgeon: Wellington Hampshire, MD;  Location: [facility];  Service: Cardiovascular;  Laterality: N/A;   CORONARY/GRAFT ACUTE MI REVASCULARIZATION N/A 10/07/2017   Procedure: Coronary/Graft Acute MI Revascularization;  Surgeon: Wellington Hampshire, MD;  Location: [facility];  Service: Cardiovascular;  Laterality: N/A;   LEFT HEART CATH AND CORONARY ANGIOGRAPHY N/A 10/07/2017   Procedure: LEFT HEART  CATH AND CORONARY ANGIOGRAPHY;  Surgeon: Wellington Hampshire, MD;  Location: [facility];  Service: Cardiovascular;  Laterality: N/A;   LEG AMPUTATION     Right Below knee   STOMACH SURGERY     Billroth II gastrojejunostomy   TOTAL KNEE ARTHROPLASTY     Current Outpatient Medications on File Prior to Visit  Medication Sig Dispense Refill   ALPRAZolam (XANAX) 0.5 MG tablet Take 1 tablet (0.5 mg total) by mouth at bedtime as needed. for sleep (Patient taking differently: Take 0.5 mg by mouth at bedtime as needed for sleep.) 30 tablet 0   aspirin 81 MG tablet Take 81 mg by mouth daily.     budesonide (ENTOCORT EC) 3 MG 24 hr capsule Take 6 mg by mouth every morning.     carvedilol (COREG) 3.125 MG tablet TAKE 1 TABLET BY MOUTH TWICE A DAY WITH MEALS 180 tablet 1   cholestyramine (QUESTRAN) 4 GM/DOSE powder USE 1 SCOOP 3 TIMES A DAY WITH A MEAL (Patient taking differently: Take 4 g by mouth daily as needed (for colitis flares (mix and drink)).) 756 g 6   fluorouracil (EFUDEX) 5 % cream Apply 1 application topically daily as needed (as directed- affected sites).     gabapentin (NEURONTIN) 100 MG capsule Take 1 capsule (100 mg total) by mouth 3 (three) times daily as needed (nerve pain). 90 capsule  3   losartan (COZAAR) 25 MG tablet TAKE 1 TABLET BY MOUTH DAILY. PLEASE KEEP UPCOMING APPT FOR FUTURE REFILLS THANK YOU (Patient taking differently: Take 25 mg by mouth in the morning.) 30 tablet 11   Multiple Vitamins-Minerals (PRESERVISION AREDS PO) Take 1 capsule by mouth 2 (two) times daily.     nitroGLYCERIN (NITROSTAT) 0.4 MG SL tablet Place 1 tablet (0.4 mg total) under the tongue every 5 (five) minutes x 3 doses as needed for chest pain. 25 tablet 3   Omega-3 Fatty Acids ([employer] FISH OIL) 1200 MG CAPS Take 1,200 mg by mouth daily with breakfast.     rosuvastatin (CRESTOR) 20 MG tablet Take 1 tablet (20 mg total) by mouth daily. (Patient taking differently: Take 20 mg by mouth in the morning.) 90  tablet 3   No current facility-administered medications on file prior to visit.   Allergies  Allergen Reactions   Aleve [Naproxen Sodium] Nausea And Vomiting and Other (See Comments)    No problem with other NSAIDs   Amoxicillin Other (See Comments)    Vertigo Did it involve swelling of the face/tongue/throat, SOB, or low BP? No Did it involve sudden or severe rash/hives, skin peeling, or any reaction on the inside of your mouth or nose? No Did you need to seek medical attention at a hospital or doctor's office? unknown When did it last happen?   yrs ago    dizziness If all above answers are "NO", may proceed with cephalosporin use.    Gabapentin Other (See Comments)    Might be sensitive to this- took 100 mg twice within a couple of hours = Ended up falling from a standing position and had to be taken to the E.D.   Morphine And Related Other (See Comments)    Dizziness    Penicillins Diarrhea and Other (See Comments)    Diarrhea Did it involve swelling of the face/tongue/throat, SOB, or low BP? No Did it involve sudden or severe rash/hives, skin peeling, or any reaction on the inside of your mouth or nose? No Did you need to seek medical attention at a hospital or doctor's office? Unknown When did it last happen?    years ago   If all above answers are "NO", may proceed with cephalosporin use.    Social History   Socioeconomic History   Marital status: Married    Spouse name: Financial risk analyst   Number of children: Not on file   Years of education: 12   Highest education level: High school graduate  Occupational History   Occupation: Retired  Tobacco Use   Smoking status: Former    Packs/day: 1.00    Years: 8.00    Pack years: 8.00    Types: Cigarettes   Smokeless tobacco: Never  Vaping Use   Vaping Use: Never used  Substance and Sexual Activity   Alcohol use: Yes    Comment: occasional   Drug use: No   Sexual activity: Yes    Comment: married  Other Topics Concern   Not  on file  Social History Narrative   Not on file   Social Determinants of Health   Financial Resource Strain: Low Risk    Difficulty of Paying Living Expenses: Not hard at all  Food Insecurity: No Food Insecurity   Worried About Charity fundraiser in the Last Year: Never true   [place] in the Last Year: Never true  Transportation Needs: No Transportation Needs   Lack of  Transportation (Medical): No   Lack of Transportation (Non-Medical): No  Physical Activity: Inactive   Days of Exercise per Week: 0 days   Minutes of Exercise per Session: 0 min  Stress: No Stress Concern Present   Feeling of Stress : Not at all  Social Connections: Moderately Integrated   Frequency of Communication with Friends and Family: More than three times a week   Frequency of Social Gatherings with Friends and Family: More than three times a week   Attends Religious Services: More than 4 times per year   Active Member of [employer] or Organizations: No   Attends Archivist Meetings: Never   Marital Status: Married  Human resources officer Violence: Not At Risk   Fear of Current or Ex-Partner: No   Emotionally Abused: No   Physically Abused: No   Sexually Abused: No   Review of Systems  All other systems reviewed and are negative.     Objective:   Physical Exam Vitals reviewed.  Cardiovascular:     Rate and Rhythm: Normal rate and regular rhythm.     Heart sounds: Normal heart sounds.  Pulmonary:     Effort: Pulmonary effort is normal. No respiratory distress.     Breath sounds: Normal breath sounds. No wheezing or rales.  Skin:    Findings: Erythema and rash present.   Right below the knee amputation. Please see history of present illness for further information       Assessment & Plan:  Irritant dermatitis I believe this is an irritant dermatitis similar to diaper rash due to sweat and moisture trapped against the skin of the rubber/neoprene.  Try Elocon cream once daily for 2  weeks and see if it improves.

## 2021-04-24 NOTE — Telephone Encounter (Signed)
Phone call to patient's wife to inform her that Robyne Askew is okay with adding him on today. Voicemail left for patient's wife to give the office a call back.

## 2021-05-24 ENCOUNTER — Other Ambulatory Visit: Payer: Self-pay

## 2021-05-24 ENCOUNTER — Ambulatory Visit (INDEPENDENT_AMBULATORY_CARE_PROVIDER_SITE_OTHER): Payer: PPO | Admitting: Family Medicine

## 2021-05-24 VITALS — BP 128/72 | HR 68 | Temp 97.2°F | Resp 18 | Ht 70.0 in | Wt 144.0 lb

## 2021-05-24 DIAGNOSIS — L249 Irritant contact dermatitis, unspecified cause: Secondary | ICD-10-CM | POA: Diagnosis not present

## 2021-05-24 MED ORDER — TRIAMCINOLONE ACETONIDE 0.1 % EX CREA: 1.0000 "application " | TOPICAL_CREAM | Freq: Two times a day (BID) | CUTANEOUS | 0 refills | Status: DC

## 2021-05-24 NOTE — Progress Notes (Signed)
Subjective:    Patient ID: Eugene [name], male    DOB: 20-Jun-1931, 88 y.o.   MRN: [id number]  Allergic Reaction  Leg Pain   Patient has a longstanding history of a right below the knee amputation.  He suffered this from a traumatic injury while cutting tree years ago.  As a result, he has to wear a below the knee prosthesis.  To wear this he has to put a neoprene liner against his skin.  Over the last couple weeks he has developed a rash.  Please see the photograph below  The rash is not warm or painful.  There are numerous erythematous papules.  It does not itch or burn.  I previously gave him Elocon.  However it was a small tube.  And he was unable to continue it.  Wednesday he was spraying weed killer.  He got the weed killer on his face.  By Wednesday evening, the skin around his mouth and his lips and his nose was swelling and erythematous.  He took Benadryl Wednesday evening as well as yesterday and the swelling is better however he he does still have some puffiness around his eyes and in his cheeks.  There is no angioedema.  He has no trouble breathing.  There is no wheezing. Past Medical History:  Diagnosis Date   Amputated right leg ([place])    CAD (coronary artery disease)    a. 09/2017 - acute MI -  emergent cath showing 99% stenosis in midLAD with thrombotic stenosis at the bifurcation of the second diagonal, which was relatively small in diameter. He underwent placement of DES to midLAD which jailed the second diagonal.    Chronic combined systolic and diastolic heart failure ([place])    Collagenous colitis    Dr. Festus Holts   History of ST elevation myocardial infarction (STEMI)    09/2017: LAD   History of stomach ulcers    Hyperlipidemia LDL goal <70    Ischemic cardiomyopathy    Squamous cell carcinoma of skin 03/27/2014   in situ-left temple (txpbx)   Squamous cell carcinoma of skin 02/24/2018   in situ-left cheek (CX35FU)   Past Surgical History:  Procedure Laterality Date    BACK SURGERY     CARDIAC CATHETERIZATION     CORONARY STENT INTERVENTION N/A 10/07/2017   Procedure: CORONARY STENT INTERVENTION;  Surgeon: Wellington Hampshire, MD;  Location: [facility];  Service: Cardiovascular;  Laterality: N/A;   CORONARY/GRAFT ACUTE MI REVASCULARIZATION N/A 10/07/2017   Procedure: Coronary/Graft Acute MI Revascularization;  Surgeon: Wellington Hampshire, MD;  Location: [facility];  Service: Cardiovascular;  Laterality: N/A;   LEFT HEART CATH AND CORONARY ANGIOGRAPHY N/A 10/07/2017   Procedure: LEFT HEART CATH AND CORONARY ANGIOGRAPHY;  Surgeon: Wellington Hampshire, MD;  Location: [facility];  Service: Cardiovascular;  Laterality: N/A;   LEG AMPUTATION     Right Below knee   STOMACH SURGERY     Billroth II gastrojejunostomy   TOTAL KNEE ARTHROPLASTY     Current Outpatient Medications on File Prior to Visit  Medication Sig Dispense Refill   ALPRAZolam (XANAX) 0.5 MG tablet Take 1 tablet (0.5 mg total) by mouth at bedtime as needed. for sleep (Patient taking differently: Take 0.5 mg by mouth at bedtime as needed for sleep.) 30 tablet 0   aspirin 81 MG tablet Take 81 mg by mouth daily.     budesonide (ENTOCORT EC) 3 MG 24 hr capsule Take 6 mg  by mouth every morning.     carvedilol (COREG) 3.125 MG tablet TAKE 1 TABLET BY MOUTH TWICE A DAY WITH MEALS 180 tablet 1   cholestyramine (QUESTRAN) 4 GM/DOSE powder USE 1 SCOOP 3 TIMES A DAY WITH A MEAL (Patient taking differently: Take 4 g by mouth daily as needed (for colitis flares (mix and drink)).) 756 g 6   fluorouracil (EFUDEX) 5 % cream Apply 1 application topically daily as needed (as directed- affected sites).     gabapentin (NEURONTIN) 100 MG capsule Take 1 capsule (100 mg total) by mouth 3 (three) times daily as needed (nerve pain). 90 capsule 3   losartan (COZAAR) 25 MG tablet TAKE 1 TABLET BY MOUTH DAILY. PLEASE KEEP UPCOMING APPT FOR FUTURE REFILLS THANK YOU (Patient taking differently: Take 25 mg by  mouth in the morning.) 30 tablet 11   mometasone (ELOCON) 0.1 % cream Apply topically daily. 45 g 1   Multiple Vitamins-Minerals (PRESERVISION AREDS PO) Take 1 capsule by mouth 2 (two) times daily.     nitroGLYCERIN (NITROSTAT) 0.4 MG SL tablet Place 1 tablet (0.4 mg total) under the tongue every 5 (five) minutes x 3 doses as needed for chest pain. 25 tablet 3   Omega-3 Fatty Acids ([employer] FISH OIL) 1200 MG CAPS Take 1,200 mg by mouth daily with breakfast.     rosuvastatin (CRESTOR) 20 MG tablet Take 1 tablet (20 mg total) by mouth daily. (Patient taking differently: Take 20 mg by mouth in the morning.) 90 tablet 3   No current facility-administered medications on file prior to visit.   Allergies  Allergen Reactions   Aleve [Naproxen Sodium] Nausea And Vomiting and Other (See Comments)    No problem with other NSAIDs   Amoxicillin Other (See Comments)    Vertigo Did it involve swelling of the face/tongue/throat, SOB, or low BP? No Did it involve sudden or severe rash/hives, skin peeling, or any reaction on the inside of your mouth or nose? No Did you need to seek medical attention at a hospital or doctor's office? unknown When did it last happen?   yrs ago    dizziness If all above answers are "NO", may proceed with cephalosporin use.    Gabapentin Other (See Comments)    Might be sensitive to this- took 100 mg twice within a couple of hours = Ended up falling from a standing position and had to be taken to the E.D.   Morphine And Related Other (See Comments)    Dizziness    Penicillins Diarrhea and Other (See Comments)    Diarrhea Did it involve swelling of the face/tongue/throat, SOB, or low BP? No Did it involve sudden or severe rash/hives, skin peeling, or any reaction on the inside of your mouth or nose? No Did you need to seek medical attention at a hospital or doctor's office? Unknown When did it last happen?    years ago   If all above answers are "NO", may proceed with  cephalosporin use.    Social History   Socioeconomic History   Marital status: Married    Spouse name: Financial risk analyst   Number of children: Not on file   Years of education: 12   Highest education level: High school graduate  Occupational History   Occupation: Retired  Tobacco Use   Smoking status: Former    Packs/day: 1.00    Years: 8.00    Pack years: 8.00    Types: Cigarettes   Smokeless tobacco: Never  Media planner  Vaping Use: Never used  Substance and Sexual Activity   Alcohol use: Yes    Comment: occasional   Drug use: No   Sexual activity: Yes    Comment: married  Other Topics Concern   Not on file  Social History Narrative   Not on file   Social Determinants of Health   Financial Resource Strain: Low Risk    Difficulty of Paying Living Expenses: Not hard at all  Food Insecurity: No Food Insecurity   Worried About Charity fundraiser in the Last Year: Never true   [place] in the Last Year: Never true  Transportation Needs: No Transportation Needs   Lack of Transportation (Medical): No   Lack of Transportation (Non-Medical): No  Physical Activity: Inactive   Days of Exercise per Week: 0 days   Minutes of Exercise per Session: 0 min  Stress: No Stress Concern Present   Feeling of Stress : Not at all  Social Connections: Moderately Integrated   Frequency of Communication with Friends and Family: More than three times a week   Frequency of Social Gatherings with Friends and Family: More than three times a week   Attends Religious Services: More than 4 times per year   Active Member of [employer] or Organizations: No   Attends Archivist Meetings: Never   Marital Status: Married  Human resources officer Violence: Not At Risk   Fear of Current or Ex-Partner: No   Emotionally Abused: No   Physically Abused: No   Sexually Abused: No   Review of Systems  All other systems reviewed and are negative.     Objective:   Physical Exam Vitals reviewed.   Cardiovascular:     Rate and Rhythm: Normal rate and regular rhythm.     Heart sounds: Normal heart sounds.  Pulmonary:     Effort: Pulmonary effort is normal. No respiratory distress.     Breath sounds: Normal breath sounds. No wheezing or rales.  Skin:    Findings: Erythema and rash present.   Right below the knee amputation. Please see history of present illness for further information       Assessment & Plan:  Irritant dermatitis I believe that the rash on his right leg is an irritant dermatitis.  It is localized just to the right leg.  The other possibility would be some type of fungal rash however the clinical appearance seems to be more of an irritant dermatitis.  Therefore I recommended that he use triamcinolone 1-2 times daily to help manage and control this.  He can also use Zyrtec 10 mg a day for the next 2 to 3 days to help the swelling in his cheeks from where the weed killer irritated his skin.

## 2021-05-27 ENCOUNTER — Ambulatory Visit: Payer: PPO | Admitting: Family Medicine

## 2021-05-27 ENCOUNTER — Other Ambulatory Visit: Payer: Self-pay | Admitting: Family Medicine

## 2021-06-06 ENCOUNTER — Other Ambulatory Visit: Payer: Self-pay | Admitting: Family Medicine

## 2021-06-14 ENCOUNTER — Ambulatory Visit (INDEPENDENT_AMBULATORY_CARE_PROVIDER_SITE_OTHER): Payer: PPO | Admitting: Family Medicine

## 2021-06-14 VITALS — BP 122/50 | HR 71 | Temp 96.2°F | Ht 70.0 in | Wt 146.8 lb

## 2021-06-14 DIAGNOSIS — L249 Irritant contact dermatitis, unspecified cause: Secondary | ICD-10-CM | POA: Diagnosis not present

## 2021-06-14 MED ORDER — CEPHALEXIN 500 MG PO CAPS: 500.0000 mg | ORAL_CAPSULE | Freq: Three times a day (TID) | ORAL | 0 refills | Status: DC

## 2021-06-14 NOTE — Progress Notes (Signed)
? ?Subjective:  ? ? Patient ID: Eugene [name], male    DOB: [birth year], 88 y.o.   MRN: [id number] ? ?Allergic Reaction ? ?Leg Pain  ? ?Patient has a longstanding history of a right below the knee amputation.  He suffered this from a traumatic injury while cutting tree years ago.  As a result, he has to wear a below the knee prosthesis.  To wear this he has to put a neoprene liner against his skin.  Over the last couple weeks he has developed a rash.  Please see the photograph below ? ?The rash is not warm or painful.  There are numerous erythematous papules.  It does not itch or burn.  I previously gave him Elocon.  However it was a small tube.  And he was unable to continue it.  Wednesday he was spraying weed killer.  He got the weed killer on his face.  By Wednesday evening, the skin around his mouth and his lips and his nose was swelling and erythematous.  He took Benadryl Wednesday evening as well as yesterday and the swelling is better however he he does still have some puffiness around his eyes and in his cheeks.  There is no angioedema.  He has no trouble breathing.  There is no wheezing.  At that time, my plan was: ?I believe that the rash on his right leg is an irritant dermatitis.  It is localized just to the right leg.  The other possibility would be some type of fungal rash however the clinical appearance seems to be more of an irritant dermatitis.  Therefore I recommended that he use triamcinolone 1-2 times daily to help manage and control this.  He can also use Zyrtec 10 mg a day for the next 2 to 3 days to help the swelling in his cheeks from where the weed killer irritated his skin. ? ?06/14/21 ?Patient presents today with the same rash on his right leg that is shown above.  He stated since he has been using triamcinolone.  It is now less red and irritated.  It does not itch.  It does not burn.  It is limited only to the skin where he is wearing a neoprene prosthesis liner.  Aside from the  discoloration, there is no other symptom. ?Past Medical History:  ?Diagnosis Date  ? Amputated right leg ([place])   ? CAD (coronary artery disease)   ? a. 09/2017 - acute MI -  emergent cath showing 99% stenosis in midLAD with thrombotic stenosis at the bifurcation of the second diagonal, which was relatively small in diameter. He underwent placement of DES to midLAD which jailed the second diagonal.   ? Chronic combined systolic and diastolic heart failure ([place])   ? Collagenous colitis   ? Dr. Festus Holts  ? History of ST elevation myocardial infarction (STEMI)   ? 09/2017: LAD  ? History of stomach ulcers   ? Hyperlipidemia LDL goal <70   ? Ischemic cardiomyopathy   ? Squamous cell carcinoma of skin 03/27/2014  ? in situ-left temple (txpbx)  ? Squamous cell carcinoma of skin 02/24/2018  ? in situ-left cheek (CX35FU)  ? ?Past Surgical History:  ?Procedure Laterality Date  ? BACK SURGERY    ? CARDIAC CATHETERIZATION    ? CORONARY STENT INTERVENTION N/A 10/07/2017  ? Procedure: CORONARY STENT INTERVENTION;  Surgeon: Wellington Hampshire, MD;  Location: [facility];  Service: Cardiovascular;  Laterality: N/A;  ? CORONARY/GRAFT ACUTE MI REVASCULARIZATION N/A 10/07/2017  ?  Procedure: Coronary/Graft Acute MI Revascularization;  Surgeon: Wellington Hampshire, MD;  Location: [facility];  Service: Cardiovascular;  Laterality: N/A;  ? LEFT HEART CATH AND CORONARY ANGIOGRAPHY N/A 10/07/2017  ? Procedure: LEFT HEART CATH AND CORONARY ANGIOGRAPHY;  Surgeon: Wellington Hampshire, MD;  Location: [facility];  Service: Cardiovascular;  Laterality: N/A;  ? LEG AMPUTATION    ? Right Below knee  ? STOMACH SURGERY    ? Billroth II gastrojejunostomy  ? TOTAL KNEE ARTHROPLASTY    ? ?Current Outpatient Medications on File Prior to Visit  ?Medication Sig Dispense Refill  ? ALPRAZolam (XANAX) 0.5 MG tablet Take 1 tablet (0.5 mg total) by mouth at bedtime as needed. for sleep (Patient taking differently: Take 0.5 mg by mouth at bedtime  as needed for sleep.) 30 tablet 0  ? aspirin 81 MG tablet Take 81 mg by mouth daily.    ? budesonide (ENTOCORT EC) 3 MG 24 hr capsule Take 6 mg by mouth every morning.    ? carvedilol (COREG) 3.125 MG tablet TAKE 1 TABLET BY MOUTH TWICE A DAY WITH MEALS 180 tablet 1  ? cholestyramine (QUESTRAN) 4 GM/DOSE powder USE 1 SCOOP 3 TIMES A DAY WITH A MEAL (Patient taking differently: Take 4 g by mouth daily as needed (for colitis flares (mix and drink)).) 756 g 6  ? fluorouracil (EFUDEX) 5 % cream Apply 1 application topically daily as needed (as directed- affected sites).    ? gabapentin (NEURONTIN) 100 MG capsule Take 1 capsule (100 mg total) by mouth 3 (three) times daily as needed (nerve pain). 90 capsule 3  ? losartan (COZAAR) 25 MG tablet TAKE 1 TABLET BY MOUTH DAILY. PLEASE KEEP UPCOMING APPT FOR FUTURE REFILLS THANK YOU (Patient taking differently: Take 25 mg by mouth in the morning.) 30 tablet 11  ? mometasone (ELOCON) 0.1 % cream Apply topically daily. 45 g 1  ? Multiple Vitamins-Minerals (PRESERVISION AREDS PO) Take 1 capsule by mouth 2 (two) times daily.    ? nitroGLYCERIN (NITROSTAT) 0.4 MG SL tablet PLACE 1 TABLET (0.4 MG TOTAL) UNDER THE TONGUE EVERY 5 MINUTES X 3 DOSES AS NEEDED FOR CHEST PAIN. 25 tablet 3  ? Omega-3 Fatty Acids ([employer] FISH OIL) 1200 MG CAPS Take 1,200 mg by mouth daily with breakfast.    ? rosuvastatin (CRESTOR) 20 MG tablet Take 1 tablet (20 mg total) by mouth daily. (Patient taking differently: Take 20 mg by mouth in the morning.) 90 tablet 3  ? triamcinolone cream (KENALOG) 0.1 % APPLY 1 APPLICATION. TOPICALLY 2 (TWO) TIMES DAILY. 60 g 0  ? ?No current facility-administered medications on file prior to visit.  ? ?Allergies  ?Allergen Reactions  ? Aleve [Naproxen Sodium] Nausea And Vomiting and Other (See Comments)  ?  No problem with other NSAIDs  ? Amoxicillin Other (See Comments)  ?  Vertigo ?Did it involve swelling of the face/tongue/throat, SOB, or low BP? No ?Did it involve sudden or  severe rash/hives, skin peeling, or any reaction on the inside of your mouth or nose? No ?Did you need to seek medical attention at a hospital or doctor's office? unknown ?When did it last happen?   yrs ago    ?dizziness ?If all above answers are "NO", may proceed with cephalosporin use. ?  ? Gabapentin Other (See Comments)  ?  Might be sensitive to this- took 100 mg twice within a couple of hours = Ended up falling from a standing position and had to  be taken to the E.D.  ? Morphine And Related Other (See Comments)  ?  Dizziness ?  ? Penicillins Diarrhea and Other (See Comments)  ?  Diarrhea ?Did it involve swelling of the face/tongue/throat, SOB, or low BP? No ?Did it involve sudden or severe rash/hives, skin peeling, or any reaction on the inside of your mouth or nose? No ?Did you need to seek medical attention at a hospital or doctor's office? Unknown ?When did it last happen?    years ago   ?If all above answers are "NO", may proceed with cephalosporin use. ?  ? ?Social History  ? ?Socioeconomic History  ? Marital status: Married  ?  Spouse name: Beckt  ? Number of children: Not on file  ? Years of education: 65  ? Highest education level: High school graduate  ?Occupational History  ? Occupation: Retired  ?Tobacco Use  ? Smoking status: Former  ?  Packs/day: 1.00  ?  Years: 8.00  ?  Pack years: 8.00  ?  Types: Cigarettes  ? Smokeless tobacco: Never  ?Vaping Use  ? Vaping Use: Never used  ?Substance and Sexual Activity  ? Alcohol use: Yes  ?  Comment: occasional  ? Drug use: No  ? Sexual activity: Yes  ?  Comment: married  ?Other Topics Concern  ? Not on file  ?Social History Narrative  ? Not on file  ? ?Social Determinants of Health  ? ?Financial Resource Strain: Low Risk   ? Difficulty of Paying Living Expenses: Not hard at all  ?Food Insecurity: No Food Insecurity  ? Worried About Charity fundraiser in the Last Year: Never true  ? Ran Out of Food in the Last Year: Never true  ?Transportation Needs: No  Transportation Needs  ? Lack of Transportation (Medical): No  ? Lack of Transportation (Non-Medical): No  ?Physical Activity: Inactive  ? Days of Exercise per Week: 0 days  ? Minutes of Exercise per Session: 0 min  ?

## 2021-07-16 ENCOUNTER — Ambulatory Visit: Payer: PPO | Admitting: Physician Assistant

## 2021-07-17 ENCOUNTER — Ambulatory Visit: Payer: PPO | Admitting: Physician Assistant

## 2021-07-17 ENCOUNTER — Encounter: Payer: Self-pay | Admitting: Physician Assistant

## 2021-07-17 DIAGNOSIS — L570 Actinic keratosis: Secondary | ICD-10-CM

## 2021-07-17 DIAGNOSIS — Z1283 Encounter for screening for malignant neoplasm of skin: Secondary | ICD-10-CM

## 2021-07-17 DIAGNOSIS — L820 Inflamed seborrheic keratosis: Secondary | ICD-10-CM

## 2021-07-24 ENCOUNTER — Encounter: Payer: Self-pay | Admitting: Physician Assistant

## 2021-07-24 NOTE — Progress Notes (Signed)
? ?  Follow-Up Visit ?  ?Subjective  ?Eugene [name] is a 88 y.o. male who presents for the following: Annual Exam (Yearly waist up exam facial isk he wants looked at and back checked ). ? ? ?The following portions of the chart were reviewed this encounter and updated as appropriate:  Tobacco  Allergies  Meds  Problems  Med Hx  Surg Hx  Fam Hx   ?  ? ?Objective  ?Well appearing patient in no apparent distress; mood and affect are within normal limits. ? ?All skin waist up examined. ? ?Left Temple (2) ?Brown crusted plaque on erythematous base.  ? ?Left Preauricular Area (2), Right Temporal Scalp ?Erythematous patches with gritty scale. ? ? ?Assessment & Plan  ?Inflamed seborrheic keratosis (2) ?Left Temple ? ?Destruction of lesion - Left Temple ?Complexity: simple   ?Destruction method: cryotherapy   ?Informed consent: discussed and consent obtained   ?Timeout:  patient name, date of birth, surgical site, and procedure verified ?Lesion destroyed using liquid nitrogen: Yes   ?Cryotherapy cycles:  1 ?Outcome: patient tolerated procedure well with no complications   ?Post-procedure details: wound care instructions given   ? ?AK (actinic keratosis) (3) ?Left Preauricular Area (2); Right Temporal Scalp ? ?Destruction of lesion - Left Preauricular Area, Right Temporal Scalp ?Complexity: simple   ?Destruction method: cryotherapy   ?Informed consent: discussed and consent obtained   ?Timeout:  patient name, date of birth, surgical site, and procedure verified ?Lesion destroyed using liquid nitrogen: Yes   ?Cryotherapy cycles:  1 ?Outcome: patient tolerated procedure well with no complications   ?Post-procedure details: wound care instructions given   ? ? ? ?I, Bertha Earwood, PA-C, have reviewed all documentation's for this visit.  The documentation on 07/24/21 for the exam, diagnosis, procedures and orders are all accurate and complete. ?

## 2021-07-26 ENCOUNTER — Ambulatory Visit (INDEPENDENT_AMBULATORY_CARE_PROVIDER_SITE_OTHER): Payer: PPO | Admitting: Family Medicine

## 2021-07-26 VITALS — BP 138/78 | HR 75 | Temp 97.2°F | Ht 70.0 in | Wt 147.0 lb

## 2021-07-26 DIAGNOSIS — S81812A Laceration without foreign body, left lower leg, initial encounter: Secondary | ICD-10-CM | POA: Diagnosis not present

## 2021-07-26 NOTE — Progress Notes (Signed)
? ?Subjective:  ? ? Patient ID: Eugene [name], male    DOB: 22-May-1931, 88 y.o.   MRN: [id number] ? ?HPI ?Patient suffered a V-shaped laceration to his anterior left shin yesterday afternoon.  It continues to bleed.  He is unable to get it to stop bleeding.  He has a skin tear in the shape of the with the vertex of the V over the medial surface of the left shin and right portion of the knee over the lateral surface.  Is roughly 4 cm long on each edge.  Is bleeding from the bottom edge of the knee. ?Past Medical History:  ?Diagnosis Date  ? Amputated right leg ([place])   ? CAD (coronary artery disease)   ? a. 09/2017 - acute MI -  emergent cath showing 99% stenosis in midLAD with thrombotic stenosis at the bifurcation of the second diagonal, which was relatively small in diameter. He underwent placement of DES to midLAD which jailed the second diagonal.   ? Chronic combined systolic and diastolic heart failure ([place])   ? Collagenous colitis   ? Dr. Festus Holts  ? History of ST elevation myocardial infarction (STEMI)   ? 09/2017: LAD  ? History of stomach ulcers   ? Hyperlipidemia LDL goal <70   ? Ischemic cardiomyopathy   ? Squamous cell carcinoma of skin 03/27/2014  ? in situ-left temple (txpbx)  ? Squamous cell carcinoma of skin 02/24/2018  ? in situ-left cheek (CX35FU)  ? ?Past Surgical History:  ?Procedure Laterality Date  ? BACK SURGERY    ? CARDIAC CATHETERIZATION    ? CORONARY STENT INTERVENTION N/A 10/07/2017  ? Procedure: CORONARY STENT INTERVENTION;  Surgeon: Wellington Hampshire, MD;  Location: [facility];  Service: Cardiovascular;  Laterality: N/A;  ? CORONARY/GRAFT ACUTE MI REVASCULARIZATION N/A 10/07/2017  ? Procedure: Coronary/Graft Acute MI Revascularization;  Surgeon: Wellington Hampshire, MD;  Location: [facility];  Service: Cardiovascular;  Laterality: N/A;  ? LEFT HEART CATH AND CORONARY ANGIOGRAPHY N/A 10/07/2017  ? Procedure: LEFT HEART CATH AND CORONARY ANGIOGRAPHY;  Surgeon: Wellington Hampshire,  MD;  Location: [facility];  Service: Cardiovascular;  Laterality: N/A;  ? LEG AMPUTATION    ? Right Below knee  ? STOMACH SURGERY    ? Billroth II gastrojejunostomy  ? TOTAL KNEE ARTHROPLASTY    ? ?Current Outpatient Medications on File Prior to Visit  ?Medication Sig Dispense Refill  ? ALPRAZolam (XANAX) 0.5 MG tablet Take 1 tablet (0.5 mg total) by mouth at bedtime as needed. for sleep (Patient taking differently: Take 0.5 mg by mouth at bedtime as needed for sleep.) 30 tablet 0  ? aspirin 81 MG tablet Take 81 mg by mouth daily.    ? budesonide (ENTOCORT EC) 3 MG 24 hr capsule Take 6 mg by mouth every morning.    ? carvedilol (COREG) 3.125 MG tablet TAKE 1 TABLET BY MOUTH TWICE A DAY WITH MEALS 180 tablet 1  ? cephALEXin (KEFLEX) 500 MG capsule Take 1 capsule (500 mg total) by mouth 3 (three) times daily. 21 capsule 0  ? cholestyramine (QUESTRAN) 4 GM/DOSE powder USE 1 SCOOP 3 TIMES A DAY WITH A MEAL (Patient taking differently: Take 4 g by mouth daily as needed (for colitis flares (mix and drink)).) 756 g 6  ? fluorouracil (EFUDEX) 5 % cream Apply 1 application topically daily as needed (as directed- affected sites).    ? gabapentin (NEURONTIN) 100 MG capsule Take 1 capsule (100 mg  total) by mouth 3 (three) times daily as needed (nerve pain). 90 capsule 3  ? losartan (COZAAR) 25 MG tablet TAKE 1 TABLET BY MOUTH DAILY. PLEASE KEEP UPCOMING APPT FOR FUTURE REFILLS THANK YOU (Patient taking differently: Take 25 mg by mouth in the morning.) 30 tablet 11  ? mometasone (ELOCON) 0.1 % cream Apply topically daily. 45 g 1  ? Multiple Vitamins-Minerals (PRESERVISION AREDS PO) Take 1 capsule by mouth 2 (two) times daily.    ? nitroGLYCERIN (NITROSTAT) 0.4 MG SL tablet PLACE 1 TABLET (0.4 MG TOTAL) UNDER THE TONGUE EVERY 5 MINUTES X 3 DOSES AS NEEDED FOR CHEST PAIN. 25 tablet 3  ? Omega-3 Fatty Acids ([employer] FISH OIL) 1200 MG CAPS Take 1,200 mg by mouth daily with breakfast.    ? rosuvastatin (CRESTOR) 20 MG tablet Take  1 tablet (20 mg total) by mouth daily. (Patient taking differently: Take 20 mg by mouth in the morning.) 90 tablet 3  ? triamcinolone cream (KENALOG) 0.1 % APPLY 1 APPLICATION. TOPICALLY 2 (TWO) TIMES DAILY. 60 g 0  ? ?No current facility-administered medications on file prior to visit.  ? ?Allergies  ?Allergen Reactions  ? Aleve [Naproxen Sodium] Nausea And Vomiting and Other (See Comments)  ?  No problem with other NSAIDs  ? Amoxicillin Other (See Comments)  ?  Vertigo ?Did it involve swelling of the face/tongue/throat, SOB, or low BP? No ?Did it involve sudden or severe rash/hives, skin peeling, or any reaction on the inside of your mouth or nose? No ?Did you need to seek medical attention at a hospital or doctor's office? unknown ?When did it last happen?   yrs ago    ?dizziness ?If all above answers are "NO", may proceed with cephalosporin use. ?  ? Gabapentin Other (See Comments)  ?  Might be sensitive to this- took 100 mg twice within a couple of hours = Ended up falling from a standing position and had to be taken to the E.D.  ? Morphine And Related Other (See Comments)  ?  Dizziness ?  ? Penicillins Diarrhea and Other (See Comments)  ?  Diarrhea ?Did it involve swelling of the face/tongue/throat, SOB, or low BP? No ?Did it involve sudden or severe rash/hives, skin peeling, or any reaction on the inside of your mouth or nose? No ?Did you need to seek medical attention at a hospital or doctor's office? Unknown ?When did it last happen?    years ago   ?If all above answers are "NO", may proceed with cephalosporin use. ?  ? ?Social History  ? ?Socioeconomic History  ? Marital status: Married  ?  Spouse name: Beckt  ? Number of children: Not on file  ? Years of education: 84  ? Highest education level: High school graduate  ?Occupational History  ? Occupation: Retired  ?Tobacco Use  ? Smoking status: Former  ?  Packs/day: 1.00  ?  Years: 8.00  ?  Pack years: 8.00  ?  Types: Cigarettes  ? Smokeless tobacco: Never   ?Vaping Use  ? Vaping Use: Never used  ?Substance and Sexual Activity  ? Alcohol use: Yes  ?  Comment: occasional  ? Drug use: No  ? Sexual activity: Yes  ?  Comment: married  ?Other Topics Concern  ? Not on file  ?Social History Narrative  ? Not on file  ? ?Social Determinants of Health  ? ?Financial Resource Strain: Low Risk   ? Difficulty of Paying Living Expenses: Not hard at  all  ?Food Insecurity: No Food Insecurity  ? Worried About Charity fundraiser in the Last Year: Never true  ? Ran Out of Food in the Last Year: Never true  ?Transportation Needs: No Transportation Needs  ? Lack of Transportation (Medical): No  ? Lack of Transportation (Non-Medical): No  ?Physical Activity: Inactive  ? Days of Exercise per Week: 0 days  ? Minutes of Exercise per Session: 0 min  ?Stress: No Stress Concern Present  ? Feeling of Stress : Not at all  ?Social Connections: Moderately Integrated  ? Frequency of Communication with Friends and Family: More than three times a week  ? Frequency of Social Gatherings with Friends and Family: More than three times a week  ? Attends Religious Services: More than 4 times per year  ? Active Member of Clubs or Organizations: No  ? Attends Archivist Meetings: Never  ? Marital Status: Married  ?Intimate Partner Violence: Not At Risk  ? Fear of Current or Ex-Partner: No  ? Emotionally Abused: No  ? Physically Abused: No  ? Sexually Abused: No  ? ?Review of Systems  ?All other systems reviewed and are negative. ? ?   ?Objective:  ? Physical Exam ?Vitals reviewed.  ?Cardiovascular:  ?   Rate and Rhythm: Normal rate and regular rhythm.  ?   Heart sounds: Normal heart sounds. No murmur heard. ?  No friction rub. No gallop.  ?Pulmonary:  ?   Effort: Pulmonary effort is normal. No respiratory distress.  ?   Breath sounds: Normal breath sounds. No wheezing or rales.  ?Musculoskeletal:  ?   Right lower leg: No edema.  ?   Left lower leg: Laceration present. No edema.  ?     Legs: ? ?Skin: ?    Coloration: Skin is not jaundiced.  ?   Findings: No bruising, erythema or lesion.  ? ?Right below the knee amputation.  ? ?   ?Assessment & Plan:  ?Laceration of left lower extremity, initial en

## 2021-08-02 ENCOUNTER — Ambulatory Visit (INDEPENDENT_AMBULATORY_CARE_PROVIDER_SITE_OTHER): Payer: PPO | Admitting: Family Medicine

## 2021-08-02 VITALS — BP 128/78 | HR 95 | Temp 97.9°F | Ht 70.0 in | Wt 146.9 lb

## 2021-08-02 DIAGNOSIS — L03116 Cellulitis of left lower limb: Secondary | ICD-10-CM

## 2021-08-02 MED ORDER — SULFAMETHOXAZOLE-TRIMETHOPRIM 800-160 MG PO TABS: 1.0000 | ORAL_TABLET | Freq: Two times a day (BID) | ORAL | 0 refills | Status: DC

## 2021-08-02 NOTE — Progress Notes (Signed)
Subjective:    Patient ID: Eugene [name], male    DOB: 09-06-1931, 88 y.o.   MRN: [id number]  HPI 07/26/21 Patient suffered a V-shaped laceration to his anterior left shin yesterday afternoon.  It continues to bleed.  He is unable to get it to stop bleeding.  He has a skin tear in the shape of V with the vertex of the V over the medial surface of the left shin and right portion of the knee over the lateral surface.  Is roughly 4 cm long on each edge.  Is bleeding from the bottom edge of the knee.  At that time, my plan was:  I anesthetized the wound with 0.1% lidocaine with epinephrine.  I then prepped and draped the patient in sterile fashion with Betadine.  I approximated the skin edges as best I could with 5 simple interrupted 3-0 Ethilon sutures.  I was unable to completely approximate the skin edges due to the fragility of the skin.  However I was able to obtain hemostasis.  I then covered the wound with copious amounts of Neosporin, petroleum gauze, and wrapped in Coban.  We discussed wound care.  Recheck in 1 week for suture removal  08/02/21 Patient is here today for suture removal.  I removed 5 sutures without difficulty.  The wound itself looks poor.  I was unable to approximate the edges of the skin due to the severity of the wound.  The sutures were only to achieve hemostasis.  There is a 1/2 cm opening between the skin edges in the anterior portion of theV.  The surrounding skin is erythematous and warm and appears to be developing cellulitis Past Medical History:  Diagnosis Date   Amputated right leg ([facility])    CAD (coronary artery disease)    a. 09/2017 - acute MI -  emergent cath showing 99% stenosis in midLAD with thrombotic stenosis at the bifurcation of the second diagonal, which was relatively small in diameter. He underwent placement of DES to midLAD which jailed the second diagonal.    Chronic combined systolic and diastolic heart failure ([place])    Collagenous colitis    Dr.  Festus Holts   History of ST elevation myocardial infarction (STEMI)    09/2017: LAD   History of stomach ulcers    Hyperlipidemia LDL goal <70    Ischemic cardiomyopathy    Squamous cell carcinoma of skin 03/27/2014   in situ-left temple (txpbx)   Squamous cell carcinoma of skin 02/24/2018   in situ-left cheek (CX35FU)   Past Surgical History:  Procedure Laterality Date   BACK SURGERY     CARDIAC CATHETERIZATION     CORONARY STENT INTERVENTION N/A 10/07/2017   Procedure: CORONARY STENT INTERVENTION;  Surgeon: Wellington Hampshire, MD;  Location: [facility];  Service: Cardiovascular;  Laterality: N/A;   CORONARY/GRAFT ACUTE MI REVASCULARIZATION N/A 10/07/2017   Procedure: Coronary/Graft Acute MI Revascularization;  Surgeon: Wellington Hampshire, MD;  Location: [facility];  Service: Cardiovascular;  Laterality: N/A;   LEFT HEART CATH AND CORONARY ANGIOGRAPHY N/A 10/07/2017   Procedure: LEFT HEART CATH AND CORONARY ANGIOGRAPHY;  Surgeon: Wellington Hampshire, MD;  Location: [facility];  Service: Cardiovascular;  Laterality: N/A;   LEG AMPUTATION     Right Below knee   STOMACH SURGERY     Billroth II gastrojejunostomy   TOTAL KNEE ARTHROPLASTY     Current Outpatient Medications on File Prior to Visit  Medication Sig Dispense Refill  aspirin 81 MG tablet Take 81 mg by mouth daily.     budesonide (ENTOCORT EC) 3 MG 24 hr capsule Take 6 mg by mouth every morning.     carvedilol (COREG) 3.125 MG tablet TAKE 1 TABLET BY MOUTH TWICE A DAY WITH MEALS 180 tablet 1   cephALEXin (KEFLEX) 500 MG capsule Take 1 capsule (500 mg total) by mouth 3 (three) times daily. 21 capsule 0   cholestyramine (QUESTRAN) 4 GM/DOSE powder USE 1 SCOOP 3 TIMES A DAY WITH A MEAL (Patient taking differently: Take 4 g by mouth daily as needed (for colitis flares (mix and drink)).) 756 g 6   fluorouracil (EFUDEX) 5 % cream Apply 1 application topically daily as needed (as directed- affected sites).      gabapentin (NEURONTIN) 100 MG capsule Take 1 capsule (100 mg total) by mouth 3 (three) times daily as needed (nerve pain). 90 capsule 3   losartan (COZAAR) 25 MG tablet TAKE 1 TABLET BY MOUTH DAILY. PLEASE KEEP UPCOMING APPT FOR FUTURE REFILLS THANK YOU (Patient taking differently: Take 25 mg by mouth in the morning.) 30 tablet 11   mometasone (ELOCON) 0.1 % cream Apply topically daily. 45 g 1   Multiple Vitamins-Minerals (PRESERVISION AREDS PO) Take 1 capsule by mouth 2 (two) times daily.     nitroGLYCERIN (NITROSTAT) 0.4 MG SL tablet PLACE 1 TABLET (0.4 MG TOTAL) UNDER THE TONGUE EVERY 5 MINUTES X 3 DOSES AS NEEDED FOR CHEST PAIN. 25 tablet 3   Omega-3 Fatty Acids ([employer] FISH OIL) 1200 MG CAPS Take 1,200 mg by mouth daily with breakfast.     rosuvastatin (CRESTOR) 20 MG tablet Take 1 tablet (20 mg total) by mouth daily. (Patient taking differently: Take 20 mg by mouth in the morning.) 90 tablet 3   triamcinolone cream (KENALOG) 0.1 % APPLY 1 APPLICATION. TOPICALLY 2 (TWO) TIMES DAILY. 60 g 0   ALPRAZolam (XANAX) 0.5 MG tablet Take 1 tablet (0.5 mg total) by mouth at bedtime as needed. for sleep (Patient not taking: Reported on 08/02/2021) 30 tablet 0   No current facility-administered medications on file prior to visit.   Allergies  Allergen Reactions   Aleve [Naproxen Sodium] Nausea And Vomiting and Other (See Comments)    No problem with other NSAIDs   Amoxicillin Other (See Comments)    Vertigo Did it involve swelling of the face/tongue/throat, SOB, or low BP? No Did it involve sudden or severe rash/hives, skin peeling, or any reaction on the inside of your mouth or nose? No Did you need to seek medical attention at a hospital or doctor's office? unknown When did it last happen?   yrs ago    dizziness If all above answers are "NO", may proceed with cephalosporin use.    Gabapentin Other (See Comments)    Might be sensitive to this- took 100 mg twice within a couple of hours = Ended up  falling from a standing position and had to be taken to the E.D.   Morphine And Related Other (See Comments)    Dizziness    Penicillins Diarrhea and Other (See Comments)    Diarrhea Did it involve swelling of the face/tongue/throat, SOB, or low BP? No Did it involve sudden or severe rash/hives, skin peeling, or any reaction on the inside of your mouth or nose? No Did you need to seek medical attention at a hospital or doctor's office? Unknown When did it last happen?    years ago   If all above  answers are "NO", may proceed with cephalosporin use.    Social History   Socioeconomic History   Marital status: Married    Spouse name: Financial risk analyst   Number of children: Not on file   Years of education: 12   Highest education level: High school graduate  Occupational History   Occupation: Retired  Tobacco Use   Smoking status: Former    Packs/day: 1.00    Years: 8.00    Pack years: 8.00    Types: Cigarettes   Smokeless tobacco: Never  Vaping Use   Vaping Use: Never used  Substance and Sexual Activity   Alcohol use: Yes    Comment: occasional   Drug use: No   Sexual activity: Yes    Comment: married  Other Topics Concern   Not on file  Social History Narrative   Not on file   Social Determinants of Health   Financial Resource Strain: Low Risk    Difficulty of Paying Living Expenses: Not hard at all  Food Insecurity: No Food Insecurity   Worried About Charity fundraiser in the Last Year: Never true   Arboriculturist in the Last Year: Never true  Transportation Needs: No Transportation Needs   Lack of Transportation (Medical): No   Lack of Transportation (Non-Medical): No  Physical Activity: Inactive   Days of Exercise per Week: 0 days   Minutes of Exercise per Session: 0 min  Stress: No Stress Concern Present   Feeling of Stress : Not at all  Social Connections: Moderately Integrated   Frequency of Communication with Friends and Family: More than three times a week    Frequency of Social Gatherings with Friends and Family: More than three times a week   Attends Religious Services: More than 4 times per year   Active Member of [employer] or Organizations: No   Attends Archivist Meetings: Never   Marital Status: Married  Human resources officer Violence: Not At Risk   Fear of Current or Ex-Partner: No   Emotionally Abused: No   Physically Abused: No   Sexually Abused: No   Review of Systems  All other systems reviewed and are negative.     Objective:   Physical Exam Vitals reviewed.  Cardiovascular:     Rate and Rhythm: Normal rate and regular rhythm.     Heart sounds: Normal heart sounds. No murmur heard.   No friction rub. No gallop.  Pulmonary:     Effort: Pulmonary effort is normal. No respiratory distress.     Breath sounds: Normal breath sounds. No wheezing or rales.  Musculoskeletal:     Right lower leg: No edema.     Left lower leg: Laceration present. No edema.       Legs:  Skin:    Coloration: Skin is not jaundiced.     Findings: No bruising, erythema or lesion.   Right below the knee amputation.      Assessment & Plan:  Cellulitis of left lower extremity 5 sutures removed without difficulty.  Begin Bactrim double strength tablets twice daily for 1 week for secondary cellulitis.  I covered the remaining portion of the wound with nonadherent gauze and Silvadene and then wrapped the wound with Coban.  I would do this daily until healed.  Recheck in 1 week if no better or sooner if worsening

## 2021-08-05 ENCOUNTER — Ambulatory Visit: Payer: PPO | Admitting: Family Medicine

## 2021-08-08 ENCOUNTER — Telehealth: Payer: Self-pay

## 2021-08-08 NOTE — Telephone Encounter (Signed)
Pt's wife, Jacqlyn Larsen, called and [place] to know [place]. Pt leaving on Monday 08/12/21  Per wife, wound looks better and its healing.   Please advice

## 2021-08-09 ENCOUNTER — Ambulatory Visit: Payer: PPO | Admitting: Family Medicine

## 2021-08-09 NOTE — Telephone Encounter (Signed)
Call spoke w/pt's wife, Jacqlyn Larsen, aware and voiced understanding. Nothing at this time.

## 2021-08-15 ENCOUNTER — Ambulatory Visit (INDEPENDENT_AMBULATORY_CARE_PROVIDER_SITE_OTHER): Payer: PPO | Admitting: Family Medicine

## 2021-08-15 VITALS — BP 115/58 | HR 72 | Temp 97.8°F | Ht 70.0 in | Wt 145.4 lb

## 2021-08-15 DIAGNOSIS — K52831 Collagenous colitis: Secondary | ICD-10-CM | POA: Diagnosis not present

## 2021-08-15 MED ORDER — BUDESONIDE 3 MG PO CPEP: 6.0000 mg | ORAL_CAPSULE | Freq: Every day | ORAL | 3 refills | Status: DC

## 2021-08-15 NOTE — Progress Notes (Signed)
Subjective:    Patient ID: Eugene [name], male    DOB: [birth year], 88 y.o.   MRN: [id number]   08/15/21 Patient has a history of collagenous colitis on colonoscopy from 2015.  He is close to take the desonide 69 mg daily.  About a month ago he stopped taking this.  Over the last 3 weeks he has developed diarrhea.  He reports 3-4 watery bowel movements a day.  He denies any melena or hematochezia.  He denies any abdominal pain or fevers or chills.  Roughly around the same time of putting on some antibiotics for cellulitis in his left lower leg.  Cellulitis has resolved however the antibiotics could be contributing diarrhea as well.  Past Medical History:  Diagnosis Date   Amputated right leg ([place])    CAD (coronary artery disease)    a. 09/2017 - acute MI -  emergent cath showing 99% stenosis in midLAD with thrombotic stenosis at the bifurcation of the second diagonal, which was relatively small in diameter. He underwent placement of DES to midLAD which jailed the second diagonal.    Chronic combined systolic and diastolic heart failure ([place])    Collagenous colitis    Dr. Festus Holts   History of ST elevation myocardial infarction (STEMI)    09/2017: LAD   History of stomach ulcers    Hyperlipidemia LDL goal <70    Ischemic cardiomyopathy    Squamous cell carcinoma of skin 03/27/2014   in situ-left temple (txpbx)   Squamous cell carcinoma of skin 02/24/2018   in situ-left cheek ([id number])   Past Surgical History:  Procedure Laterality Date   BACK SURGERY     CARDIAC CATHETERIZATION     CORONARY STENT INTERVENTION N/A 10/07/2017   Procedure: CORONARY STENT INTERVENTION;  Surgeon: Wellington Hampshire, MD;  Location: [facility];  Service: Cardiovascular;  Laterality: N/A;   CORONARY/GRAFT ACUTE MI REVASCULARIZATION N/A 10/07/2017   Procedure: Coronary/Graft Acute MI Revascularization;  Surgeon: Wellington Hampshire, MD;  Location: [facility];  Service: Cardiovascular;  Laterality:  N/A;   LEFT HEART CATH AND CORONARY ANGIOGRAPHY N/A 10/07/2017   Procedure: LEFT HEART CATH AND CORONARY ANGIOGRAPHY;  Surgeon: Wellington Hampshire, MD;  Location: [facility];  Service: Cardiovascular;  Laterality: N/A;   LEG AMPUTATION     Right Below knee   STOMACH SURGERY     Billroth II gastrojejunostomy   TOTAL KNEE ARTHROPLASTY     Current Outpatient Medications on File Prior to Visit  Medication Sig Dispense Refill   ALPRAZolam (XANAX) 0.5 MG tablet Take 1 tablet (0.5 mg total) by mouth at bedtime as needed. for sleep 30 tablet 0   aspirin 81 MG tablet Take 81 mg by mouth daily.     budesonide (ENTOCORT EC) 3 MG 24 hr capsule Take 6 mg by mouth every morning.     carvedilol (COREG) 3.125 MG tablet TAKE 1 TABLET BY MOUTH TWICE A DAY WITH MEALS 180 tablet 1   cephALEXin (KEFLEX) 500 MG capsule Take 1 capsule (500 mg total) by mouth 3 (three) times daily. 21 capsule 0   cholestyramine (QUESTRAN) 4 GM/DOSE powder USE 1 SCOOP 3 TIMES A DAY WITH A MEAL (Patient taking differently: Take 4 g by mouth daily as needed (for colitis flares (mix and drink)).) 756 g 6   fluorouracil (EFUDEX) 5 % cream Apply 1 application topically daily as needed (as directed- affected sites).     gabapentin (NEURONTIN) 100 MG capsule  Take 1 capsule (100 mg total) by mouth 3 (three) times daily as needed (nerve pain). 90 capsule 3   losartan (COZAAR) 25 MG tablet TAKE 1 TABLET BY MOUTH DAILY. PLEASE KEEP UPCOMING APPT FOR FUTURE REFILLS THANK YOU (Patient taking differently: Take 25 mg by mouth in the morning.) 30 tablet 11   mometasone (ELOCON) 0.1 % cream Apply topically daily. 45 g 1   Multiple Vitamins-Minerals (PRESERVISION AREDS PO) Take 1 capsule by mouth 2 (two) times daily.     nitroGLYCERIN (NITROSTAT) 0.4 MG SL tablet PLACE 1 TABLET (0.4 MG TOTAL) UNDER THE TONGUE EVERY 5 MINUTES X 3 DOSES AS NEEDED FOR CHEST PAIN. 25 tablet 3   Omega-3 Fatty Acids ([employer] FISH OIL) 1200 MG CAPS Take 1,200 mg by mouth  daily with breakfast.     rosuvastatin (CRESTOR) 20 MG tablet Take 1 tablet (20 mg total) by mouth daily. (Patient taking differently: Take 20 mg by mouth in the morning.) 90 tablet 3   sulfamethoxazole-trimethoprim (BACTRIM DS) 800-160 MG tablet Take 1 tablet by mouth 2 (two) times daily. 14 tablet 0   triamcinolone cream (KENALOG) 0.1 % APPLY 1 APPLICATION. TOPICALLY 2 (TWO) TIMES DAILY. 60 g 0   No current facility-administered medications on file prior to visit.   Allergies  Allergen Reactions   Aleve [Naproxen Sodium] Nausea And Vomiting and Other (See Comments)    No problem with other NSAIDs   Amoxicillin Other (See Comments)    Vertigo Did it involve swelling of the face/tongue/throat, SOB, or low BP? No Did it involve sudden or severe rash/hives, skin peeling, or any reaction on the inside of your mouth or nose? No Did you need to seek medical attention at a hospital or doctor's office? unknown When did it last happen?   yrs ago    dizziness If all above answers are "NO", may proceed with cephalosporin use.    Gabapentin Other (See Comments)    Might be sensitive to this- took 100 mg twice within a couple of hours = Ended up falling from a standing position and had to be taken to the E.D.   Morphine And Related Other (See Comments)    Dizziness    Penicillins Diarrhea and Other (See Comments)    Diarrhea Did it involve swelling of the face/tongue/throat, SOB, or low BP? No Did it involve sudden or severe rash/hives, skin peeling, or any reaction on the inside of your mouth or nose? No Did you need to seek medical attention at a hospital or doctor's office? Unknown When did it last happen?    years ago   If all above answers are "NO", may proceed with cephalosporin use.    Social History   Socioeconomic History   Marital status: Married    Spouse name: Financial risk analyst   Number of children: Not on file   Years of education: 12   Highest education level: High school graduate   Occupational History   Occupation: Retired  Tobacco Use   Smoking status: Former    Packs/day: 1.00    Years: 8.00    Pack years: 8.00    Types: Cigarettes   Smokeless tobacco: Never  Vaping Use   Vaping Use: Never used  Substance and Sexual Activity   Alcohol use: Yes    Comment: occasional   Drug use: No   Sexual activity: Yes    Comment: married  Other Topics Concern   Not on file  Social History Narrative   Not on  file   Social Determinants of Health   Financial Resource Strain: Low Risk    Difficulty of Paying Living Expenses: Not hard at all  Food Insecurity: No Food Insecurity   Worried About Charity fundraiser in the Last Year: Never true   Arboriculturist in the Last Year: Never true  Transportation Needs: No Transportation Needs   Lack of Transportation (Medical): No   Lack of Transportation (Non-Medical): No  Physical Activity: Inactive   Days of Exercise per Week: 0 days   Minutes of Exercise per Session: 0 min  Stress: No Stress Concern Present   Feeling of Stress : Not at all  Social Connections: Moderately Integrated   Frequency of Communication with Friends and Family: More than three times a week   Frequency of Social Gatherings with Friends and Family: More than three times a week   Attends Religious Services: More than 4 times per year   Active Member of [employer] or Organizations: No   Attends Archivist Meetings: Never   Marital Status: Married  Human resources officer Violence: Not At Risk   Fear of Current or Ex-Partner: No   Emotionally Abused: No   Physically Abused: No   Sexually Abused: No   Review of Systems  All other systems reviewed and are negative.     Objective:   Physical Exam Vitals reviewed.  Cardiovascular:     Rate and Rhythm: Normal rate and regular rhythm.     Heart sounds: Normal heart sounds.  Pulmonary:     Effort: Pulmonary effort is normal. No respiratory distress.     Breath sounds: Normal breath sounds. No  wheezing or rales.  Skin:    Findings: Erythema and rash present.   Right below the knee amputation. Please see history of present illness for further information      Assessment & Plan:  Collagenous colitis I suspect collagenous colitis.  Resume budesonide 6 mg p.o. every morning.  If diarrhea does not improve, consider stool studies to rule out C. difficile given his recent antibiotic use.  Could also consider adding a probiotic.

## 2021-09-05 ENCOUNTER — Ambulatory Visit (INDEPENDENT_AMBULATORY_CARE_PROVIDER_SITE_OTHER): Payer: PPO

## 2021-09-05 VITALS — Ht 70.0 in | Wt 145.0 lb

## 2021-09-05 DIAGNOSIS — Z0000 Encounter for general adult medical examination without abnormal findings: Secondary | ICD-10-CM | POA: Diagnosis not present

## 2021-09-05 NOTE — Patient Instructions (Signed)
[name] , Thank you for taking time to come for your Medicare Wellness Visit. I appreciate your ongoing commitment to your health goals. Please review the following plan we discussed and let me know if I can assist you in the future.   Screening recommendations/referrals: Colonoscopy: No longer required due to age.   Recommended yearly ophthalmology/optometry visit for glaucoma screening and checkup Recommended yearly dental visit for hygiene and checkup  Vaccinations: Influenza vaccine: Done 01/15/2021 Repeat annually  Pneumococcal vaccine: Done 09/21/2014 and 05/09/2002. Tdap vaccine: Done 07/29/2012 Repeat in 10 years  Shingles vaccine: Done 07/07/2006, 07/30/2017 and 05/27/2017.   Covid-19: Done 01/23/2020, 12/27/2019, 04/28/2019 and 04/08/2019.  Advanced directives: Please bring a copy of your health care power of attorney and living will to the office to be added to your chart at your convenience.   Conditions/risks identified: KEEP UP THE GOOD WORK!!  Next appointment: Follow up in one year for your annual wellness visit. 2024.  Preventive Care 7 Years and Older, Male  Preventive care refers to lifestyle choices and visits with your health care provider that can promote health and wellness. What does preventive care include? A yearly physical exam. This is also called an annual well check. Dental exams once or twice a year. Routine eye exams. Ask your health care provider how often you should have your eyes checked. Personal lifestyle choices, including: Daily care of your teeth and gums. Regular physical activity. Eating a healthy diet. Avoiding tobacco and drug use. Limiting alcohol use. Practicing safe sex. Taking low doses of aspirin every day. Taking vitamin and mineral supplements as recommended by your health care provider. What happens during an annual well check? The services and screenings done by your health care provider during your annual well check will depend  on your age, overall health, lifestyle risk factors, and family history of disease. Counseling  Your health care provider may ask you questions about your: Alcohol use. Tobacco use. Drug use. Emotional well-being. Home and relationship well-being. Sexual activity. Eating habits. History of falls. Memory and ability to understand (cognition). Work and work Statistician. Screening  You may have the following tests or measurements: Height, weight, and BMI. Blood pressure. Lipid and cholesterol levels. These may be checked every 5 years, or more frequently if you are over 78 years old. Skin check. Lung cancer screening. You may have this screening every year starting at age 64 if you have a 30-pack-year history of smoking and currently smoke or have quit within the past 15 years. Fecal occult blood test (FOBT) of the stool. You may have this test every year starting at age 66. Flexible sigmoidoscopy or colonoscopy. You may have a sigmoidoscopy every 5 years or a colonoscopy every 10 years starting at age 66. Prostate cancer screening. Recommendations will vary depending on your family history and other risks. Hepatitis C blood test. Hepatitis B blood test. Sexually transmitted disease (STD) testing. Diabetes screening. This is done by checking your blood sugar (glucose) after you have not eaten for a while (fasting). You may have this done every 1-3 years. Abdominal aortic aneurysm (AAA) screening. You may need this if you are a current or former smoker. Osteoporosis. You may be screened starting at age 81 if you are at high risk. Talk with your health care provider about your test results, treatment options, and if necessary, the need for more tests. Vaccines  Your health care provider may recommend certain vaccines, such as: Influenza vaccine. This is recommended every year. Tetanus,  diphtheria, and acellular pertussis (Tdap, Td) vaccine. You may need a Td booster every 10  years. Zoster vaccine. You may need this after age 61. Pneumococcal 13-valent conjugate (PCV13) vaccine. One dose is recommended after age 74. Pneumococcal polysaccharide (PPSV23) vaccine. One dose is recommended after age 44. Talk to your health care provider about which screenings and vaccines you need and how often you need them. This information is not intended to replace advice given to you by your health care provider. Make sure you discuss any questions you have with your health care provider. Document Released: 03/30/2015 Document Revised: 11/21/2015 Document Reviewed: 01/02/2015 Elsevier Interactive Patient Education  2017 [employer] Prevention in the Home Falls can cause injuries. They can happen to people of all ages. There are many things you can do to make your home safe and to help prevent falls. What can I do on the outside of my home? Regularly fix the edges of walkways and driveways and fix any cracks. Remove anything that might make you trip as you walk through a door, such as a raised step or threshold. Trim any bushes or trees on the path to your home. Use bright outdoor lighting. Clear any walking paths of anything that might make someone trip, such as rocks or tools. Regularly check to see if handrails are loose or broken. Make sure that both sides of any steps have handrails. Any raised decks and porches should have guardrails on the edges. Have any leaves, snow, or ice cleared regularly. Use sand or salt on walking paths during winter. Clean up any spills in your garage right away. This includes oil or grease spills. What can I do in the bathroom? Use night lights. Install grab bars by the toilet and in the tub and shower. Do not use towel bars as grab bars. Use non-skid mats or decals in the tub or shower. If you need to sit down in the shower, use a plastic, non-slip stool. Keep the floor dry. Clean up any water that spills on the floor as soon as it  happens. Remove soap buildup in the tub or shower regularly. Attach bath mats securely with double-sided non-slip rug tape. Do not have throw rugs and other things on the floor that can make you trip. What can I do in the bedroom? Use night lights. Make sure that you have a light by your bed that is easy to reach. Do not use any sheets or blankets that are too big for your bed. They should not hang down onto the floor. Have a firm chair that has side arms. You can use this for support while you get dressed. Do not have throw rugs and other things on the floor that can make you trip. What can I do in the kitchen? Clean up any spills right away. Avoid walking on wet floors. Keep items that you use a lot in easy-to-reach places. If you need to reach something above you, use a strong step stool that has a grab bar. Keep electrical cords out of the way. Do not use floor polish or wax that makes floors slippery. If you must use wax, use non-skid floor wax. Do not have throw rugs and other things on the floor that can make you trip. What can I do with my stairs? Do not leave any items on the stairs. Make sure that there are handrails on both sides of the stairs and use them. Fix handrails that are broken or loose. Make  sure that handrails are as long as the stairways. Check any carpeting to make sure that it is firmly attached to the stairs. Fix any carpet that is loose or worn. Avoid having throw rugs at the top or bottom of the stairs. If you do have throw rugs, attach them to the floor with carpet tape. Make sure that you have a light switch at the top of the stairs and the bottom of the stairs. If you do not have them, ask someone to add them for you. What else can I do to help prevent falls? Wear shoes that: Do not have high heels. Have rubber bottoms. Are comfortable and fit you well. Are closed at the toe. Do not wear sandals. If you use a stepladder: Make sure that it is fully opened.  Do not climb a closed stepladder. Make sure that both sides of the stepladder are locked into place. Ask someone to hold it for you, if possible. Clearly mark and make sure that you can see: Any grab bars or handrails. First and last steps. Where the edge of each step is. Use tools that help you move around (mobility aids) if they are needed. These include: Canes. Walkers. Scooters. Crutches. Turn on the lights when you go into a dark area. Replace any light bulbs as soon as they burn out. Set up your furniture so you have a clear path. Avoid moving your furniture around. If any of your floors are uneven, fix them. If there are any pets around you, be aware of where they are. Review your medicines with your doctor. Some medicines can make you feel dizzy. This can increase your chance of falling. Ask your doctor what other things that you can do to help prevent falls. This information is not intended to replace advice given to you by your health care provider. Make sure you discuss any questions you have with your health care provider. Document Released: 12/28/2008 Document Revised: 08/09/2015 Document Reviewed: 04/07/2014 Elsevier Interactive Patient Education  2017 [employer].

## 2021-09-05 NOTE — Progress Notes (Cosign Needed)
Subjective:   Eugene [name] is a 88 y.o. male who presents for Medicare Annual/Subsequent preventive examination. Virtual Visit via Telephone Note  I connected with  Eugene [name] on 09/05/21 at 10:30 AM EDT by telephone and verified that I am speaking with the correct person using two identifiers.  Location: Patient: HOME Provider: [facility] Persons participating in the virtual visit: patient/Nurse Health Advisor   I discussed the limitations, risks, security and privacy concerns of performing an evaluation and management service by telephone and the availability of in person appointments. The patient expressed understanding and agreed to proceed.  Interactive audio and video telecommunications were attempted between this nurse and patient, however failed, due to patient having technical difficulties OR patient did not have access to video capability.  We continued and completed visit with audio only.  Some vital signs may be absent or patient reported.   Eugene Driver, LPN  Review of Systems     Cardiac Risk Factors include: advanced age (>56mn, >>56women);hypertension;dyslipidemia;male gender;sedentary lifestyle;Other (see comment), Risk factor comments: Amputee, CHF     Objective:    Today's Vitals   09/05/21 1036  Weight: 145 lb (65.8 kg)  Height: '5\' 10"'$  (1.778 m)   Body mass index is 20.81 kg/m.     09/05/2021   10:42 AM 04/06/2021    5:58 AM 04/06/2021    5:44 AM 04/05/2021    7:46 PM 10/08/2020   10:31 AM 08/16/2020    3:56 PM 09/29/2019    3:29 PM  Advanced Directives  Does Patient Have a Medical Advance Directive? Yes  No Yes Yes Yes Yes  Type of AParamedicof [place]un[place]Living will   Living will  [place] will  Does patient want to make changes to medical advance directive?     No - Patient declined No - Patient declined   Copy of [place]? No -  copy requested    No - copy requested No - copy requested   Would patient like information on creating a medical advance directive?  No - Patient declined         Current Medications (verified) Outpatient Encounter Medications as of 09/05/2021  Medication Sig   ALPRAZolam (XANAX) 0.5 MG tablet Take 1 tablet (0.5 mg total) by mouth at bedtime as needed. for sleep   aspirin 81 MG tablet Take 81 mg by mouth daily.   budesonide (ENTOCORT EC) 3 MG 24 hr capsule Take 6 mg by mouth every morning.   budesonide (ENTOCORT EC) 3 MG 24 hr capsule Take 2 capsules (6 mg total) by mouth daily.   carvedilol (COREG) 3.125 MG tablet TAKE 1 TABLET BY MOUTH TWICE A DAY WITH MEALS   cephALEXin (KEFLEX) 500 MG capsule Take 1 capsule (500 mg total) by mouth 3 (three) times daily.   cholestyramine (QUESTRAN) 4 GM/DOSE powder USE 1 SCOOP 3 TIMES A DAY WITH A MEAL (Patient taking differently: Take 4 g by mouth daily as needed (for colitis flares (mix and drink)).)   fluorouracil (EFUDEX) 5 % cream Apply 1 application topically daily as needed (as directed- affected sites).   gabapentin (NEURONTIN) 100 MG capsule Take 1 capsule (100 mg total) by mouth 3 (three) times daily as needed (nerve pain).   losartan (COZAAR) 25 MG tablet TAKE 1 TABLET BY MOUTH DAILY. PLEASE KEEP UPCOMING APPT FOR FUTURE REFILLS THANK YOU (Patient taking differently: Take 25 mg  by mouth in the morning.)   mometasone (ELOCON) 0.1 % cream Apply topically daily.   Multiple Vitamins-Minerals (PRESERVISION AREDS PO) Take 1 capsule by mouth 2 (two) times daily.   nitroGLYCERIN (NITROSTAT) 0.4 MG SL tablet PLACE 1 TABLET (0.4 MG TOTAL) UNDER THE TONGUE EVERY 5 MINUTES X 3 DOSES AS NEEDED FOR CHEST PAIN.   Omega-3 Fatty Acids ([employer] FISH OIL) 1200 MG CAPS Take 1,200 mg by mouth daily with breakfast.   rosuvastatin (CRESTOR) 20 MG tablet Take 1 tablet (20 mg total) by mouth daily. (Patient taking differently: Take 20 mg by mouth in the morning.)    triamcinolone cream (KENALOG) 0.1 % APPLY 1 APPLICATION. TOPICALLY 2 (TWO) TIMES DAILY.   sulfamethoxazole-trimethoprim (BACTRIM DS) 800-160 MG tablet Take 1 tablet by mouth 2 (two) times daily. (Patient not taking: Reported on 09/05/2021)   No facility-administered encounter medications on file as of 09/05/2021.    Allergies (verified) Aleve [naproxen sodium], Amoxicillin, Gabapentin, Morphine and related, and Penicillins   History: Past Medical History:  Diagnosis Date   Amputated right leg (HCC)    CAD (coronary artery disease)    a. 09/2017 - acute MI -  emergent cath showing 99% stenosis in midLAD with thrombotic stenosis at the bifurcation of the second diagonal, which was relatively small in diameter. He underwent placement of DES to midLAD which jailed the second diagonal.    Chronic combined systolic and diastolic heart failure ([place])    Collagenous colitis    Dr. Festus Holts   History of ST elevation myocardial infarction (STEMI)    09/2017: LAD   History of stomach ulcers    Hyperlipidemia LDL goal <70    Ischemic cardiomyopathy    Squamous cell carcinoma of skin 03/27/2014   in situ-left temple (txpbx)   Squamous cell carcinoma of skin 02/24/2018   in situ-left cheek ([id number])   Past Surgical History:  Procedure Laterality Date   BACK SURGERY     CARDIAC CATHETERIZATION     CORONARY STENT INTERVENTION N/A 10/07/2017   Procedure: CORONARY STENT INTERVENTION;  Surgeon: Wellington Hampshire, MD;  Location: [facility];  Service: Cardiovascular;  Laterality: N/A;   CORONARY/GRAFT ACUTE MI REVASCULARIZATION N/A 10/07/2017   Procedure: Coronary/Graft Acute MI Revascularization;  Surgeon: Wellington Hampshire, MD;  Location: [facility];  Service: Cardiovascular;  Laterality: N/A;   LEFT HEART CATH AND CORONARY ANGIOGRAPHY N/A 10/07/2017   Procedure: LEFT HEART CATH AND CORONARY ANGIOGRAPHY;  Surgeon: Wellington Hampshire, MD;  Location: [facility];  Service:  Cardiovascular;  Laterality: N/A;   LEG AMPUTATION     Right Below knee   STOMACH SURGERY     Billroth II gastrojejunostomy   TOTAL KNEE ARTHROPLASTY     Family History  Problem Relation Age of Onset   Diabetes Sister    Diabetes Brother    Diabetes Sister    Cancer Sister        breast   Social History   Socioeconomic History   Marital status: Married    Spouse name: Financial risk analyst   Number of children: Not on file   Years of education: 12   Highest education level: High school graduate  Occupational History   Occupation: Retired  Tobacco Use   Smoking status: Former    Packs/day: 1.00    Years: 8.00    Total pack years: 8.00    Types: Cigarettes   Smokeless tobacco: Never  Vaping Use   Vaping Use: Never used  Substance and Sexual Activity   Alcohol use: Yes    Comment: occasional   Drug use: No   Sexual activity: Yes    Comment: married  Other Topics Concern   Not on file  Social History Narrative   Not on file   Social Determinants of Health   Financial Resource Strain: Low Risk  (09/05/2021)   Overall Financial Resource Strain (CARDIA)    Difficulty of Paying Living Expenses: Not hard at all  Food Insecurity: No Food Insecurity (09/05/2021)   Hunger Vital Sign    Worried About Running Out of Food in the Last Year: Never true    Ran Out of Food in the Last Year: Never true  Transportation Needs: No Transportation Needs (09/05/2021)   PRAPARE - Hydrologist (Medical): No    Lack of Transportation (Non-Medical): No  Physical Activity: Sufficiently Active (09/05/2021)   Exercise Vital Sign    Days of Exercise per Week: 5 days    Minutes of Exercise per Session: 30 min  Stress: No Stress Concern Present (09/05/2021)   [place]    Feeling of Stress : Not at all  Social Connections: [place] (09/05/2021)   Social Connection and Isolation Panel [NHANES]     Frequency of Communication with Friends and Family: More than three times a week    Frequency of Social Gatherings with Friends and Family: More than three times a week    Attends Religious Services: More than 4 times per year    Active Member of [employer] or Organizations: Yes    Attends Music therapist: More than 4 times per year    Marital Status: Married    Tobacco Counseling Counseling given: Not Answered   Clinical Intake:  Pre-visit preparation completed: Yes  Pain : No/denies pain     BMI - recorded: 20.81 Nutritional Status: BMI of 19-24  Normal Nutritional Risks: None Diabetes: No  How often do you need to have someone help you when you read instructions, pamphlets, or other written materials from your doctor or pharmacy?: 1 - Never  Diabetic?NO  Interpreter Needed?: No  Information entered by :: mj Annleigh Knueppel,lpn   Activities of Daily Living    09/05/2021   10:44 AM 04/06/2021    5:50 AM  In your present state of health, do you have any difficulty performing the following activities:  Hearing? 1   Vision? 0   Difficulty concentrating or making decisions? 0   Walking or climbing stairs? 0   Dressing or bathing? 0   Doing errands, shopping? 0 0  Preparing Food and eating ? N   Using the Toilet? N   In the past six months, have you accidently leaked urine? N   Do you have problems with loss of bowel control? N   Managing your Medications? N   Managing your Finances? N   Housekeeping or managing your Housekeeping? N     Patient Care Team: Susy Frizzle, MD as PCP - General (Family Medicine) Burnell Blanks, MD as PCP - Cardiology (Cardiology) Edythe Clarity, [facility] as Pharmacist (Pharmacist) Warren Danes, PA-C as Physician Assistant (Dermatology)  Indicate any recent Medical Services you may have received from other than [facility] providers in the past year (date may be approximate).     Assessment:   This is a routine wellness  examination for [place].  Hearing/Vision screen Hearing Screening - Comments:: Hearing issues,  uses hearing aids. Vision Screening - Comments:: Glasses, readers. Dr. Celene Squibb. 2022.  Dietary issues and exercise activities discussed: Current Exercise Habits: Home exercise routine, Type of exercise: walking, Time (Minutes): 30, Frequency (Times/Week): 3, Weekly Exercise (Minutes/Week): 90, Intensity: Mild, Exercise limited by: cardiac condition(s);orthopedic condition(s);psychological condition(s)   Goals Addressed             This Visit's Progress    Patient Stated   On track    I would like to continue to maintain my current state of health     Prevent falls   On track      Depression Screen    09/05/2021   10:40 AM 10/08/2020   10:29 AM 08/16/2020    3:58 PM 09/29/2019    3:30 PM 11/08/2018    9:30 AM 03/29/2018   11:56 AM 12/28/2017   10:46 AM  PHQ 2/9 Scores  PHQ - 2 Score 0 0 0 0 0 0 0    Fall Risk    09/05/2021   10:44 AM 10/08/2020   10:29 AM 08/16/2020    3:57 PM 10/11/2019    1:49 PM 09/29/2019    3:29 PM  Fall Risk   Falls in the past year? 0 '1 1 1 '$ 0  Comment    Emmi Telephone Survey: data to providers prior to load   Number falls in past yr: 0 '1 1 1 '$ 0  Comment    Emmi Telephone Survey Actual Response = 1   Injury with Fall? 0 0 0 0 0  Risk for fall due to : No Fall Risks History of fall(s);Impaired balance/gait Orthopedic patient    Follow up Falls prevention discussed Falls evaluation completed Falls evaluation completed;Falls prevention discussed      FALL RISK PREVENTION PERTAINING TO THE HOME:  Any stairs in or around the home? Yes  If so, are there any without handrails? No  Home free of loose throw rugs in walkways, pet beds, electrical cords, etc? Yes  Adequate lighting in your home to reduce risk of falls? Yes   ASSISTIVE DEVICES UTILIZED TO PREVENT FALLS:  Life alert?  NO Use of a cane, walker or w/c? Yes  Grab bars in the bathroom? Yes  Shower  chair or bench in shower? Yes  Elevated toilet seat or a handicapped toilet? Yes   TIMED UP AND GO:  Was the test performed? No .  PHONE VISIT.  Cognitive Function:        09/05/2021   10:49 AM  6CIT Screen  What Year? 0 points  What month? 0 points  What time? 0 points  Count back from 20 0 points  Months in reverse 0 points  Repeat phrase 0 points  Total Score 0 points    Immunizations Immunization History  Administered Date(s) Administered   Fluad Quad(high Dose 65+) 11/08/2018, 01/15/2021   Influenza, High Dose Seasonal PF 01/08/2017, 12/15/2017   Influenza,inj,Quad PF,6+ Mos 12/28/2012, 01/17/2014   Influenza-Unspecified 01/15/2016, 12/15/2017   PFIZER(Purple Top)SARS-COV-2 Vaccination 04/08/2019, 04/28/2019, 12/27/2019, 01/23/2020   Pneumococcal Conjugate-13 09/21/2014   Pneumococcal Polysaccharide-23 05/09/2002   Tdap 07/29/2012   Zoster Recombinat (Shingrix) 05/27/2017, 07/30/2017   Zoster, Live 07/07/2006    TDAP status: Up to date  Flu Vaccine status: Up to date  Pneumococcal vaccine status: Up to date  Covid-19 vaccine status: Completed vaccines  Qualifies for Shingles Vaccine? Yes   Zostavax completed Yes   Shingrix Completed?: Yes  Screening Tests Health Maintenance  Topic Date Due  COVID-19 Vaccine (5 - Booster for Pfizer series) 09/21/2021 (Originally 03/19/2020)   INFLUENZA VACCINE  10/15/2021   TETANUS/TDAP  07/30/2022   Pneumonia Vaccine 84+ Years old  Completed   Zoster Vaccines- Shingrix  Completed   HPV VACCINES  Aged Out    Health Maintenance  There are no preventive care reminders to display for this patient.   Colorectal cancer screening: No longer required.   Lung Cancer Screening: (Low Dose CT Chest recommended if Age 59-80 years, 30 pack-year currently smoking OR have quit w/in 15years.) does not qualify.  Additional Screening:  Hepatitis C Screening: does not qualify.  Vision Screening: Recommended annual  ophthalmology exams for early detection of glaucoma and other disorders of the eye. Is the patient up to date with their annual eye exam?  Yes  Who is the provider or what is the name of the office in which the patient attends annual eye exams? Dr. Celene Squibb If pt is not established with a provider, would they like to be referred to a provider to establish care? No .   Dental Screening: Recommended annual dental exams for proper oral hygiene  Community Resource Referral / Chronic Care Management: CRR required this visit?  No   CCM required this visit?  No      Plan:     I have personally reviewed and noted the following in the patient's chart:   Medical and social history Use of alcohol, tobacco or illicit drugs  Current medications and supplements including opioid prescriptions. Patient is not currently taking opioid prescriptions. Functional ability and status Nutritional status Physical activity Advanced directives List of other physicians Hospitalizations, surgeries, and ER visits in previous 12 months Vitals Screenings to include cognitive, depression, and falls Referrals and appointments  In addition, I have reviewed and discussed with patient certain preventive protocols, quality metrics, and best practice recommendations. A written personalized care plan for preventive services as well as general preventive health recommendations were provided to patient.     Eugene Driver, LPN   [id number]/[id number]   Nurse Notes: Pt is up to date on all age appropriate health maintenance and vaccines.

## 2021-09-13 ENCOUNTER — Ambulatory Visit: Payer: PPO | Admitting: Family Medicine

## 2021-09-13 ENCOUNTER — Other Ambulatory Visit: Payer: Self-pay | Admitting: Cardiovascular Disease

## 2021-09-27 NOTE — Progress Notes (Signed)
Chronic Care Management Pharmacy Note  10/14/2021 Name:  Eugene [name] MRN:  [id number] DOB:  06/30/1931  Summary: PharmD follow up for general check in.  Medications updated and reviewed,  Recommendations/Changes made from today's visit: No changes/recs at this visit  Plan: FU In 6 months    Subjective: Eugene [name] is an 88 y.o. year old male who is a primary patient of Pickard, Cammie Mcgee, MD.  The CCM team was consulted for assistance with disease management and care coordination needs.    Engaged with patient by telephone for follow up visit in response to provider referral for pharmacy case management and/or care coordination services.   Consent to Services:  The patient was given the following information about Chronic Care Management services today, agreed to services, and gave verbal consent: 1. CCM service includes personalized support from designated clinical staff supervised by the primary care provider, including individualized plan of care and coordination with other care providers 2. 24/7 contact phone numbers for assistance for urgent and routine care needs. 3. Service will only be billed when office clinical staff spend 20 minutes or more in a month to coordinate care. 4. Only one practitioner may furnish and bill the service in a calendar month. 5.The patient may stop CCM services at any time (effective at the end of the month) by phone call to the office staff. 6. The patient will be responsible for cost sharing (co-pay) of up to 20% of the service fee (after annual deductible is met). Patient agreed to services and consent obtained.  Patient Care Team: Susy Frizzle, MD as PCP - General (Family Medicine) Burnell Blanks, MD as PCP - Cardiology (Cardiology) Edythe Clarity, [facility] as Pharmacist (Pharmacist) Starlyn Skeans as Physician Assistant (Dermatology)  Recent office visits:  02/26/21 Dennard Schaumann) - open wound, treated and put neosporin on   11/05/20 Dr. Dennard Schaumann For seborrheic keratoses inflamed. No medication changes.  10/08/20 Dr. Dennard Schaumann For medicare wellness. No medication changes.  09/19/20 (Telemedicine) Eulogio Bear, NP. For cough. STARTED Benzonatate 100 mg 2 times daily.    Recent consult visits:  None since 09/13/20   Hospital visits:  None since 09/13/20   Objective:  Lab Results  Component Value Date   CREATININE 0.96 04/06/2021   BUN 9 04/06/2021   GFRNONAA >60 04/06/2021   GFRAA 92 01/03/2020   NA 133 (L) 04/06/2021   K 3.4 (L) 04/06/2021   CALCIUM 8.1 (L) 04/06/2021   CO2 23 04/06/2021   GLUCOSE 87 04/06/2021    Lab Results  Component Value Date/Time   HGBA1C 5.9 (H) 04/06/2021 03:40 AM    Last diabetic Eye exam: No results found for: "HMDIABEYEEXA"  Last diabetic Foot exam: No results found for: "HMDIABFOOTEX"   Lab Results  Component Value Date   CHOL 117 04/06/2021   HDL 58 04/06/2021   LDLCALC 51 04/06/2021   TRIG 40 04/06/2021   CHOLHDL 2.0 04/06/2021       Latest Ref Rng & Units 04/06/2021    3:40 AM 04/05/2021    7:21 PM 10/08/2020   11:38 AM  Hepatic Function  Total Protein 6.5 - 8.1 g/dL 5.2  5.6  5.7   Albumin 3.5 - 5.0 g/dL 2.8  3.2    AST 15 - 41 U/L 27  33  19   ALT 0 - 44 U/L 23  25  22    Alk Phosphatase 38 - 126 U/L 54  62    Total  Bilirubin 0.3 - 1.2 mg/dL 0.7  0.6  1.0     Lab Results  Component Value Date/Time   TSH 1.778 04/06/2021 03:40 AM   TSH 1.60 10/23/2015 08:23 AM   TSH 1.127 09/13/2014 08:46 AM       Latest Ref Rng & Units 04/06/2021    3:40 AM 04/05/2021    7:26 PM 04/05/2021    7:21 PM  CBC  WBC 4.0 - 10.5 K/uL 5.8   6.8   Hemoglobin 13.0 - 17.0 g/dL 13.8  14.3  14.4   Hematocrit 39.0 - 52.0 % 40.9  42.0  44.0   Platelets 150 - 400 K/uL 159   142     No results found for: "VD25OH"  Clinical ASCVD: Yes  The ASCVD Risk score (Arnett DK, et al., 2019) failed to calculate for the following reasons:   The 2019 ASCVD risk score is only valid  for ages 78 to 35   The patient has a prior MI or stroke diagnosis       09/05/2021   10:40 AM 10/08/2020   10:29 AM 08/16/2020    3:58 PM  Depression screen PHQ 2/9  Decreased Interest 0 0 0  Down, Depressed, Hopeless 0 0 0  PHQ - 2 Score 0 0 0     Social History   Tobacco Use  Smoking Status Former   Packs/day: 1.00   Years: 8.00   Total pack years: 8.00   Types: Cigarettes  Smokeless Tobacco Never   BP Readings from Last 3 Encounters:  08/15/21 (!) 115/58  08/02/21 128/78  07/26/21 138/78   Pulse Readings from Last 3 Encounters:  08/15/21 72  08/02/21 95  07/26/21 75   Wt Readings from Last 3 Encounters:  09/05/21 145 lb (65.8 kg)  08/15/21 145 lb 6.4 oz (66 kg)  08/02/21 146 lb 14.4 oz (66.6 kg)   BMI Readings from Last 3 Encounters:  09/05/21 20.81 kg/m  08/15/21 20.86 kg/m  08/02/21 21.08 kg/m    Assessment/Interventions: Review of patient past medical history, allergies, medications, health status, including review of consultants reports, laboratory and other test data, was performed as part of comprehensive evaluation and provision of chronic care management services.   SDOH:  (Social Determinants of Health) assessments and interventions performed: No, done within last year  Financial Resource Strain: Low Risk  (09/05/2021)   Overall Financial Resource Strain (CARDIA)    Difficulty of Paying Living Expenses: Not hard at all    SDOH Screenings   Alcohol Screen: Low Risk  (09/05/2021)   Alcohol Screen    Last Alcohol Screening Score (AUDIT): 0  Depression (PHQ2-9): Low Risk  (09/05/2021)   Depression (PHQ2-9)    PHQ-2 Score: 0  Financial Resource Strain: Low Risk  (09/05/2021)   Overall Financial Resource Strain (CARDIA)    Difficulty of Paying Living Expenses: Not hard at all  Food Insecurity: No Food Insecurity (09/05/2021)   Hunger Vital Sign    Worried About Running Out of Food in the Last Year: Never true    Ran Out of Food in the Last Year:  Never true  Housing: Low Risk  (09/05/2021)   Housing    Last Housing Risk Score: 0  Physical Activity: Sufficiently Active (09/05/2021)   Exercise Vital Sign    Days of Exercise per Week: 5 days    Minutes of Exercise per Session: 30 min  Social Connections: Socially Integrated (09/05/2021)   Social Connection and Isolation Panel [NHANES]  Frequency of Communication with Friends and Family: More than three times a week    Frequency of Social Gatherings with Friends and Family: More than three times a week    Attends Religious Services: More than 4 times per year    Active Member of Clubs or Organizations: Yes    Attends Archivist Meetings: More than 4 times per year    Marital Status: Married  Stress: No Stress Concern Present (09/05/2021)   [place]    Feeling of Stress : Not at all  Tobacco Use: Medium Risk (09/05/2021)   Patient History    Smoking Tobacco Use: Former    Smokeless Tobacco Use: Never    Passive Exposure: Not on file  Transportation Needs: No Transportation Needs (09/05/2021)   PRAPARE - Hydrologist (Medical): No    Lack of Transportation (Non-Medical): No    CCM Care Plan  Allergies  Allergen Reactions   Aleve [Naproxen Sodium] Nausea And Vomiting and Other (See Comments)    No problem with other NSAIDs   Amoxicillin Other (See Comments)    Vertigo Did it involve swelling of the face/tongue/throat, SOB, or low BP? No Did it involve sudden or severe rash/hives, skin peeling, or any reaction on the inside of your mouth or nose? No Did you need to seek medical attention at a hospital or doctor's office? unknown When did it last happen?   yrs ago    dizziness If all above answers are "NO", may proceed with cephalosporin use.    Gabapentin Other (See Comments)    Might be sensitive to this- took 100 mg twice within a couple of hours = Ended up falling  from a standing position and had to be taken to the E.D.   Morphine And Related Other (See Comments)    Dizziness    Penicillins Diarrhea and Other (See Comments)    Diarrhea Did it involve swelling of the face/tongue/throat, SOB, or low BP? No Did it involve sudden or severe rash/hives, skin peeling, or any reaction on the inside of your mouth or nose? No Did you need to seek medical attention at a hospital or doctor's office? Unknown When did it last happen?    years ago   If all above answers are "NO", may proceed with cephalosporin use.     Medications Reviewed Today     Reviewed by Edythe Clarity, [facility] (Pharmacist) on 10/14/21 at 1444  Med List Status: <None>   Medication Order Taking? Sig Documenting Provider Last Dose Status Informant  ALPRAZolam (XANAX) 0.5 MG tablet [id number] Yes Take 1 tablet (0.5 mg total) by mouth at bedtime as needed. for sleep Susy Frizzle, MD Taking Active Multiple Informants  aspirin 81 MG tablet [id number] Yes Take 81 mg by mouth daily. [provider] Taking Active Multiple Informants           Med Note Landry Mellow, Roxana Hires Feb 04, 2018 10:19 AM)    budesonide (ENTOCORT EC) 3 MG 24 hr capsule [id number] Yes Take 6 mg by mouth every morning. [provider] Taking Active Multiple Informants           Med Note Duffy Bruce, Legrand Como   Fri Apr 05, 2021  8:48 PM) The patient takes 6 mg (2 capsules) daily, per family..  budesonide (ENTOCORT EC) 3 MG 24 hr capsule [id number] Yes Take 2 capsules (6 mg total) by mouth  daily. Susy Frizzle, MD Taking Active   carvedilol (COREG) 3.125 MG tablet [id number] Yes TAKE 1 TABLET BY MOUTH TWICE A DAY WITH MEALS Burnell Blanks, MD Taking Active   cephALEXin (KEFLEX) 500 MG capsule [id number] Yes Take 1 capsule (500 mg total) by mouth 3 (three) times daily. Susy Frizzle, MD Taking Active   cholestyramine Lucrezia Starch) 4 GM/DOSE powder [id number] Yes USE 1 SCOOP 3 TIMES A DAY WITH A MEAL   Patient taking differently: Take 4 g by mouth daily as needed (for colitis flares (mix and drink)).   Susy Frizzle, MD Taking Active Multiple Informants  fluorouracil (EFUDEX) 5 % cream [id number] Yes Apply 1 application topically daily as needed (as directed- affected sites). [provider] Taking Active Multiple Informants  gabapentin (NEURONTIN) 100 MG capsule [id number] Yes Take 1 capsule (100 mg total) by mouth 3 (three) times daily as needed (nerve pain). Susy Frizzle, MD Taking Active Multiple Informants  losartan (COZAAR) 25 MG tablet [id number] Yes TAKE 1 TABLET BY MOUTH DAILY. PLEASE KEEP UPCOMING APPT FOR FUTURE REFILLS [place] YOU  Patient taking differently: Take 25 mg by mouth in the morning.   Burnell Blanks, MD Taking Active Multiple Informants  mometasone (ELOCON) 0.1 % cream [id number] Yes Apply topically daily. Susy Frizzle, MD Taking Active   Multiple Vitamins-Minerals (PRESERVISION AREDS PO) [id number] Yes Take 1 capsule by mouth 2 (two) times daily. [provider] Taking Active Multiple Informants  nitroGLYCERIN (NITROSTAT) 0.4 MG SL tablet [id number] Yes PLACE 1 TABLET (0.4 MG TOTAL) UNDER THE TONGUE EVERY 5 MINUTES X 3 DOSES AS NEEDED FOR CHEST PAIN. Susy Frizzle, MD Taking Active   Omega-3 Fatty Acids ([facility] FISH OIL) 1200 MG CAPS [id number] Yes Take 1,200 mg by mouth daily with breakfast. [provider] Taking Active Multiple Informants  rosuvastatin (CRESTOR) 20 MG tablet [id number] Yes Take 1 tablet (20 mg total) by mouth daily.  Patient taking differently: Take 20 mg by mouth in the morning.   Susy Frizzle, MD Taking Active Multiple Informants  sulfamethoxazole-trimethoprim (BACTRIM DS) 800-160 MG tablet [id number] Yes Take 1 tablet by mouth 2 (two) times daily. Susy Frizzle, MD Taking Active   triamcinolone cream (KENALOG) 0.1 % [id number] Yes APPLY 1 APPLICATION. TOPICALLY 2 (TWO) TIMES DAILY. Susy Frizzle, MD  Taking Active             Patient Active Problem List   Diagnosis Date Noted   COVID-19 virus infection 04/05/2021   TIA (transient ischemic attack) 04/05/2021   CAD (coronary artery disease) 10/10/2017   Ischemic cardiomyopathy    Chronic combined systolic and diastolic heart failure ([place])    Hyperlipidemia LDL goal <70    Acute ST elevation myocardial infarction (STEMI) involving left anterior descending (LAD) coronary artery ([place]) 10/07/2017   Collagenous colitis    History of right below knee amputation ([place]) 10/13/2013   Sinus tachycardia 06/04/2012   Anxiety state, unspecified 06/04/2012    Immunization History  Administered Date(s) Administered   Fluad Quad(high Dose 65+) 11/08/2018, 01/15/2021   Influenza, High Dose Seasonal PF 01/08/2017, 12/15/2017   Influenza,inj,Quad PF,6+ Mos 12/28/2012, 01/17/2014   Influenza-Unspecified 01/15/2016, 12/15/2017   PFIZER(Purple Top)SARS-COV-2 Vaccination 04/08/2019, 04/28/2019, 12/27/2019, 01/23/2020   Pneumococcal Conjugate-13 09/21/2014   Pneumococcal Polysaccharide-23 05/09/2002   Tdap 07/29/2012   Zoster Recombinat (Shingrix) 05/27/2017, 07/30/2017   Zoster, Live 07/07/2006    Conditions to be addressed/monitored:  Hyperlipidemia, Heart Failure, and Anxiety  Care Plan : General  Pharmacy (Adult)  Updates made by Edythe Clarity, RPH since 10/14/2021 12:00 AM     Problem: Hyperlipidemia, Heart Failure, and Anxiety   Priority: High  Onset Date: 09/13/2020     Long-Range Goal: Patient-Specific Goal   Start Date: 09/13/2020  Expected End Date: 03/15/2021  Recent Progress: On track  Priority: High  Note:   Current Barriers:  None identified at this time  Pharmacist Clinical Goal(s):  Patient will achieve adherence to monitoring guidelines and medication adherence to achieve therapeutic efficacy maintain control of cholesterol and BP as evidenced by labs  adhere to prescribed medication regimen as evidenced by  pill box usage contact provider office for questions/concerns as evidenced notation of same in electronic health record through collaboration with PharmD and provider.   Interventions: 1:1 collaboration with Susy Frizzle, MD regarding development and update of comprehensive plan of care as evidenced by provider attestation and co-signature Inter-disciplinary care team collaboration (see longitudinal plan of care) Comprehensive medication review performed; medication list updated in electronic medical record  Hyperlipidemia: (LDL goal < 70) 10/14/21 -Controlled, based on last LDL -Current treatment: Rosuvastatin 66m daily Appropriate, Effective, Safe, Accessible -Medications previously tried: none noted  -Educated on Cholesterol goals;  Benefits of statin for ASCVD risk reduction; Importance of limiting foods high in cholesterol; -Most recent LDL well controlled - LDL was 51 in January! -Counseled on diet and exercise extensively Recommended to continue current medication, tolerating well.  Update 03/15/21 Continues to take statin as directed, no concerns with myalgias, Most recent LDL is controlled. Patient doing well overall, they have asked for an in person visit for him and his wife in 6 months for a general medication review. No changes to meds at this time.  Heart Failure (Goal: manage symptoms and prevent exacerbations) 10/10/21 -Controlled -Last ejection fraction: 55%  -HF type: Combined Systolic and Diastolic -NYHA Class: II (slight limitation of activity) -Current treatment: Carvedilol 3.1255mtwice daily Appropriate, Effective, Safe, Accessible Losartan 2552mppropriate, Effective, Safe, Accessible -Medications previously tried: none noted  -Educated on Benefits of medications for managing symptoms and prolonging life Importance of weighing daily; if you gain more than 3 pounds in one day or 5 pounds in one week, contact providers Importance of blood pressure  control -Denies any recent swelling, feeling good overall. -Weight has remained constant.  Update 03/15/21 Patient denies any swelling.  He does not weight himself to monitor, however just just visually monitors to see if he is retaining fluids.  BP has been controlled at home, denies any SOB recently. No changes needed at this time continue current meds.  Anxiety (Goal: Control symptoms) -Controlled -Current treatment: Alprazolam 0.5mg60mn for sleep -Medications previously tried/failed: none -GAD7: No flowsheet data found. -Educated on Benefits of medication for symptom control -Still takes prn -Recommended to continue current medication  Patient Goals/Self-Care Activities Patient will:  - take medications as prescribed focus on medication adherence by pill box check blood pressure if symptomatic, document, and provide at future appointments  Follow Up Plan: The care management team will reach out to the patient again over the next 180 days.              Medication Assistance: None required.  Patient affirms current coverage meets needs.  Compliance/Adherence/Medication fill history: Care Gaps: N/A  Star Meds: Losartan 25mg24m19/23 30ds Rosuvastatin 20mg 45m9/23 30ds  Preferred pharmacy is[facility]  [id number] [place] - [address] [place]  [place] [address] Phone: [phone] Fax: [phone]   Uses pill box? Yes Pt endorses 100% compliance  We discussed: Benefits of medication synchronization, packaging and delivery as well as enhanced pharmacist oversight with [employer]. Patient decided to: Continue current medication management strategy  Care Plan and Follow Up Patient Decision:  Patient agrees to Care Plan and Follow-up.  Plan: The care management team will reach out to the patient again over the next 180 days.  Beverly Milch, PharmD Clinical Pharmacist [place] [phone]

## 2021-10-10 ENCOUNTER — Ambulatory Visit: Payer: PPO | Admitting: Pharmacist

## 2021-10-10 DIAGNOSIS — I5042 Chronic combined systolic (congestive) and diastolic (congestive) heart failure: Secondary | ICD-10-CM

## 2021-10-10 DIAGNOSIS — E785 Hyperlipidemia, unspecified: Secondary | ICD-10-CM

## 2021-10-14 NOTE — Patient Instructions (Addendum)
Visit Information   Goals Addressed             This Visit's Progress    Track and Manage Fluids and Swelling-Heart Failure   On track    Timeframe:  Long-Range Goal Priority:  High Start Date:    09/13/20                         Expected End Date:  03/15/21                      Follow Up Date 12/14/20    - call office if I gain more than 2 pounds in one day or 5 pounds in one week - keep legs up while sitting - watch for swelling in feet, ankles and legs every day - weigh myself daily    Why is this important?   It is important to check your weight daily and watch how much salt and liquids you have.  It will help you to manage your heart failure.    Notes:        Patient Care Plan: General Pharmacy (Adult)     Problem Identified: Hyperlipidemia, Heart Failure, and Anxiety   Priority: High  Onset Date: 09/13/2020     Long-Range Goal: Patient-Specific Goal   Start Date: 09/13/2020  Expected End Date: 03/15/2021  Recent Progress: On track  Priority: High  Note:   Current Barriers:  None identified at this time  Pharmacist Clinical Goal(s):  Patient will achieve adherence to monitoring guidelines and medication adherence to achieve therapeutic efficacy maintain control of cholesterol and BP as evidenced by labs  adhere to prescribed medication regimen as evidenced by pill box usage contact provider office for questions/concerns as evidenced notation of same in electronic health record through collaboration with PharmD and provider.   Interventions: 1:1 collaboration with Susy Frizzle, MD regarding development and update of comprehensive plan of care as evidenced by provider attestation and co-signature Inter-disciplinary care team collaboration (see longitudinal plan of care) Comprehensive medication review performed; medication list updated in electronic medical record  Hyperlipidemia: (LDL goal < 70) 10/14/21 -Controlled, based on last LDL -Current  treatment: Rosuvastatin '20mg'$  daily Appropriate, Effective, Safe, Accessible -Medications previously tried: none noted  -Educated on Cholesterol goals;  Benefits of statin for ASCVD risk reduction; Importance of limiting foods high in cholesterol; -Most recent LDL well controlled - LDL was 51 in January! -Counseled on diet and exercise extensively Recommended to continue current medication, tolerating well.  Update 03/15/21 Continues to take statin as directed, no concerns with myalgias, Most recent LDL is controlled. Patient doing well overall, they have asked for an in person visit for him and his wife in 6 months for a general medication review. No changes to meds at this time.  Heart Failure (Goal: manage symptoms and prevent exacerbations) 10/10/21 -Controlled -Last ejection fraction: 55%  -HF type: Combined Systolic and Diastolic -NYHA Class: II (slight limitation of activity) -Current treatment: Carvedilol 3.'125mg'$  twice daily Appropriate, Effective, Safe, Accessible Losartan '25mg'$  Appropriate, Effective, Safe, Accessible -Medications previously tried: none noted  -Educated on Benefits of medications for managing symptoms and prolonging life Importance of weighing daily; if you gain more than 3 pounds in one day or 5 pounds in one week, contact providers Importance of blood pressure control -Denies any recent swelling, feeling good overall. -Weight has remained constant.  Update 03/15/21 Patient denies any swelling.  He does not weight himself to  monitor, however just just visually monitors to see if he is retaining fluids.  BP has been controlled at home, denies any SOB recently. No changes needed at this time continue current meds.  Anxiety (Goal: Control symptoms) -Controlled -Current treatment: Alprazolam 0.'5mg'$  prn for sleep -Medications previously tried/failed: none -GAD7: No flowsheet data found. -Educated on Benefits of medication for symptom control -Still takes  prn -Recommended to continue current medication  Patient Goals/Self-Care Activities Patient will:  - take medications as prescribed focus on medication adherence by pill box check blood pressure if symptomatic, document, and provide at future appointments  Follow Up Plan: The care management team will reach out to the patient again over the next 180 days.           The patient verbalized understanding of instructions, educational materials, and care plan provided today and DECLINED offer to receive copy of patient instructions, educational materials, and care plan.  Telephone follow up appointment with pharmacy team member scheduled for: 1 year  Edythe Clarity, [place], PharmD, [place] Clinical Pharmacist Practitioner [place] [phone]

## 2021-11-11 DIAGNOSIS — Z89511 Acquired absence of right leg below knee: Secondary | ICD-10-CM | POA: Diagnosis not present

## 2021-11-11 DIAGNOSIS — F411 Generalized anxiety disorder: Secondary | ICD-10-CM | POA: Diagnosis not present

## 2021-11-11 DIAGNOSIS — H3530 Unspecified macular degeneration: Secondary | ICD-10-CM | POA: Diagnosis not present

## 2021-11-11 DIAGNOSIS — I10 Essential (primary) hypertension: Secondary | ICD-10-CM | POA: Diagnosis not present

## 2021-11-11 DIAGNOSIS — Z7982 Long term (current) use of aspirin: Secondary | ICD-10-CM | POA: Diagnosis not present

## 2021-11-11 DIAGNOSIS — I5042 Chronic combined systolic (congestive) and diastolic (congestive) heart failure: Secondary | ICD-10-CM | POA: Diagnosis not present

## 2021-11-11 DIAGNOSIS — I6523 Occlusion and stenosis of bilateral carotid arteries: Secondary | ICD-10-CM | POA: Diagnosis not present

## 2021-11-11 DIAGNOSIS — I2510 Atherosclerotic heart disease of native coronary artery without angina pectoris: Secondary | ICD-10-CM | POA: Diagnosis not present

## 2021-11-11 DIAGNOSIS — K523 Indeterminate colitis: Secondary | ICD-10-CM | POA: Diagnosis not present

## 2021-11-11 DIAGNOSIS — Z87891 Personal history of nicotine dependence: Secondary | ICD-10-CM | POA: Diagnosis not present

## 2021-11-11 DIAGNOSIS — I739 Peripheral vascular disease, unspecified: Secondary | ICD-10-CM | POA: Diagnosis not present

## 2021-11-11 DIAGNOSIS — E785 Hyperlipidemia, unspecified: Secondary | ICD-10-CM | POA: Diagnosis not present

## 2021-11-13 DIAGNOSIS — D485 Neoplasm of uncertain behavior of skin: Secondary | ICD-10-CM | POA: Diagnosis not present

## 2021-11-13 DIAGNOSIS — D1801 Hemangioma of skin and subcutaneous tissue: Secondary | ICD-10-CM | POA: Diagnosis not present

## 2021-11-13 DIAGNOSIS — L814 Other melanin hyperpigmentation: Secondary | ICD-10-CM | POA: Diagnosis not present

## 2021-11-13 DIAGNOSIS — L570 Actinic keratosis: Secondary | ICD-10-CM | POA: Diagnosis not present

## 2021-11-13 DIAGNOSIS — C44319 Basal cell carcinoma of skin of other parts of face: Secondary | ICD-10-CM | POA: Diagnosis not present

## 2021-11-13 DIAGNOSIS — L821 Other seborrheic keratosis: Secondary | ICD-10-CM | POA: Diagnosis not present

## 2021-11-27 DIAGNOSIS — C44319 Basal cell carcinoma of skin of other parts of face: Secondary | ICD-10-CM | POA: Diagnosis not present

## 2021-12-16 ENCOUNTER — Ambulatory Visit: Payer: PPO

## 2021-12-20 ENCOUNTER — Other Ambulatory Visit: Payer: Self-pay | Admitting: Family Medicine

## 2021-12-20 NOTE — Telephone Encounter (Signed)
Requested medication (s) are due for refill today: yes  Requested medication (s) are on the active medication list: yes  Last refill:  08/16/2020 #30 with 1 RF  Future visit scheduled: seen 08/15/21  Notes to clinic:  This medication can not be delegated, please assess.        Requested Prescriptions  Pending Prescriptions Disp Refills   ALPRAZolam (XANAX) 0.5 MG tablet [Pharmacy Med Name: ALPRAZOLAM 0.5 MG TABLET] 30 tablet     Sig: Take 1 tablet (0.5 mg total) by mouth at bedtime as needed. for sleep     Not Delegated - Psychiatry: Anxiolytics/Hypnotics 2 Failed - 12/20/2021  2:05 PM      Failed - This refill cannot be delegated      Failed - Urine Drug Screen completed in last 360 days      Passed - Patient is not pregnant      Passed - Valid encounter within last 6 months    Recent Outpatient Visits           4 months ago Collagenous colitis   [place] Susy Frizzle, MD   4 months ago Cellulitis of left lower extremity   [place] Dennard Schaumann Cammie Mcgee, MD   4 months ago Laceration of left lower extremity, initial encounter   [place] Pickard, Cammie Mcgee, MD   6 months ago Irritant dermatitis   [place] Pickard, Cammie Mcgee, MD   7 months ago Irritant dermatitis   [place] Pickard, Cammie Mcgee, MD       Future Appointments             In 1 month Angelena Form, Annita Brod, MD [place]

## 2022-01-06 ENCOUNTER — Encounter: Payer: Self-pay | Admitting: Family Medicine

## 2022-01-06 ENCOUNTER — Ambulatory Visit (INDEPENDENT_AMBULATORY_CARE_PROVIDER_SITE_OTHER): Payer: PPO | Admitting: Family Medicine

## 2022-01-06 VITALS — BP 140/72 | HR 66 | Temp 98.1°F | Ht 70.0 in | Wt 147.0 lb

## 2022-01-06 DIAGNOSIS — M545 Low back pain, unspecified: Secondary | ICD-10-CM | POA: Diagnosis not present

## 2022-01-06 NOTE — Progress Notes (Signed)
   Acute Office Visit  Subjective:     Patient ID: Eugene [name], male    DOB: June 09, 1931, 88 y.o.   MRN: [id number]  Chief Complaint  Patient presents with   Follow-up   Back Pain    Back pain for 2 days    Back Pain   Patient is in today for mild left low back pain for 2 days. He has tried ice with improvement. He does not recall a mechanism of injury but did pick up a couch last week. Denies shooting leg pains, numbness, tingling, loss of urine or stool, dysuria, or frequency. He feels as though his pain is improving overall.   Review of Systems  Constitutional: Negative.   Gastrointestinal: Negative.   Genitourinary: Negative.   Musculoskeletal:  Positive for back pain.  Neurological: Negative.   Psychiatric/Behavioral: Negative.          Objective:    BP (!) 140/72   Pulse 66   Temp 98.1 F (36.7 C) (Oral)   Ht '5\' 10"'$  (1.778 m)   Wt 147 lb (66.7 kg)   SpO2 98%   BMI 21.09 kg/m    Physical Exam Vitals and nursing note reviewed.  Musculoskeletal:        General: Normal range of motion.     Cervical back: Normal.     Thoracic back: Normal.     Lumbar back: Normal.  Skin:    General: Skin is warm and dry.  Neurological:     General: No focal deficit present.     Mental Status: He is oriented to person, place, and time.  Psychiatric:        Mood and Affect: Mood normal.        Behavior: Behavior normal.        Thought Content: Thought content normal.        Judgment: Judgment normal.     No results found for any visits on 01/06/22.      Assessment & Plan:   1. Acute left-sided low back pain without sciatica No red flag symptoms. Ice is helping with the pain and he feels as though it is improving so continue with conservative management. Encouraged rest, ice/heat application, and OTC medications if needed. Return to office if not improved or worsening symptoms.   No orders of the defined types were placed in this encounter.   Return if  symptoms worsen or fail to improve.  Rubie Maid, FNP

## 2022-01-21 ENCOUNTER — Ambulatory Visit: Payer: PPO | Admitting: Family Medicine

## 2022-01-22 ENCOUNTER — Ambulatory Visit: Payer: PPO | Admitting: Physician Assistant

## 2022-01-26 NOTE — Progress Notes (Unsigned)
No chief complaint on file.   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 He was admitted to [facility] in July 2019 with an anterior ST elevation MI. Cardiac cath showed a 99% mid LAD stenosis. A drug eluting stent was placed in the mid LAD. Echo 10/08/17 with LVEF=35-40% with akinesis of the anterior and apical walls. Echo January 2023 with LVEF=55%. Trivial MR. Mild AS and AI.    She is here today for follow up. The patient denies any chest pain, dyspnea, palpitations, lower extremity edema, orthopnea, PND, dizziness, near syncope or syncope.   Primary Care Physician: Susy Frizzle, MD  Past Medical History:  Diagnosis Date   Amputated right leg ([place])    CAD (coronary artery disease)    a. 09/2017 - acute MI -  emergent cath showing 99% stenosis in midLAD with thrombotic stenosis at the bifurcation of the second diagonal, which was relatively small in diameter. He underwent placement of DES to midLAD which jailed the second diagonal.    Chronic combined systolic and diastolic heart failure ([place])    Collagenous colitis    Dr. Festus Holts   History of ST elevation myocardial infarction (STEMI)    09/2017: LAD   History of stomach ulcers    Hyperlipidemia LDL goal <70    Ischemic cardiomyopathy    Squamous cell carcinoma of skin 03/27/2014   in situ-left temple (txpbx)   Squamous cell carcinoma of skin 02/24/2018   in situ-left cheek ([id number])    Past Surgical History:  Procedure Laterality Date   BACK SURGERY     CARDIAC CATHETERIZATION     CORONARY STENT INTERVENTION N/A 10/07/2017   Procedure: CORONARY STENT INTERVENTION;  Surgeon: Wellington Hampshire, MD;  Location: [place] [facility];  Service: Cardiovascular;  Laterality: N/A;   CORONARY/GRAFT ACUTE MI REVASCULARIZATION N/A 10/07/2017    Procedure: Coronary/Graft Acute MI Revascularization;  Surgeon: Wellington Hampshire, MD;  Location: [place] [facility];  Service: Cardiovascular;  Laterality: N/A;   LEFT HEART CATH AND CORONARY ANGIOGRAPHY N/A 10/07/2017   Procedure: LEFT HEART CATH AND CORONARY ANGIOGRAPHY;  Surgeon: Wellington Hampshire, MD;  Location: [place] [facility];  Service: Cardiovascular;  Laterality: N/A;   LEG AMPUTATION     Right Below knee   STOMACH SURGERY     Billroth II gastrojejunostomy   TOTAL KNEE ARTHROPLASTY      Current Outpatient Medications  Medication Sig Dispense Refill   ALPRAZolam (XANAX) 0.5 MG tablet TAKE 1 TABLET (0.5 MG TOTAL) BY MOUTH AT BEDTIME AS NEEDED. FOR SLEEP 30 tablet 3   aspirin 81 MG tablet Take 81 mg by mouth daily.     budesonide (ENTOCORT EC) 3 MG 24 hr capsule Take 6 mg by mouth every morning.     budesonide (ENTOCORT EC) 3 MG 24 hr capsule Take 2 capsules (6 mg total) by mouth daily. 180 capsule 3   carvedilol (COREG) 3.125 MG tablet TAKE 1 TABLET BY MOUTH TWICE A DAY WITH MEALS 180 tablet 1   cephALEXin (KEFLEX) 500 MG capsule Take 1 capsule (500 mg total) by mouth 3 (three) times daily. (Patient not taking: Reported on 01/06/2022) 21 capsule 0   cholestyramine (QUESTRAN) 4 GM/DOSE powder USE 1 SCOOP 3 TIMES A DAY WITH A MEAL (Patient taking differently: Take 4 g by mouth  daily as needed (for colitis flares (mix and drink)).) 756 g 6   fluorouracil (EFUDEX) 5 % cream Apply 1 application topically daily as needed (as directed- affected sites).     gabapentin (NEURONTIN) 100 MG capsule Take 1 capsule (100 mg total) by mouth 3 (three) times daily as needed (nerve pain). 90 capsule 3   losartan (COZAAR) 25 MG tablet TAKE 1 TABLET BY MOUTH DAILY. PLEASE KEEP UPCOMING APPT FOR FUTURE REFILLS THANK YOU (Patient taking differently: Take 25 mg by mouth in the morning.) 30 tablet 11   mometasone (ELOCON) 0.1 % cream Apply topically daily. 45 g 1   Multiple Vitamins-Minerals (PRESERVISION  AREDS PO) Take 1 capsule by mouth 2 (two) times daily.     nitroGLYCERIN (NITROSTAT) 0.4 MG SL tablet PLACE 1 TABLET (0.4 MG TOTAL) UNDER THE TONGUE EVERY 5 MINUTES X 3 DOSES AS NEEDED FOR CHEST PAIN. 25 tablet 3   Omega-3 Fatty Acids ([employer] FISH OIL) 1200 MG CAPS Take 1,200 mg by mouth daily with breakfast.     rosuvastatin (CRESTOR) 20 MG tablet Take 1 tablet (20 mg total) by mouth daily. (Patient taking differently: Take 20 mg by mouth in the morning.) 90 tablet 3   sulfamethoxazole-trimethoprim (BACTRIM DS) 800-160 MG tablet Take 1 tablet by mouth 2 (two) times daily. 14 tablet 0   triamcinolone cream (KENALOG) 0.1 % APPLY 1 APPLICATION. TOPICALLY 2 (TWO) TIMES DAILY. 60 g 0   No current facility-administered medications for this visit.    Allergies  Allergen Reactions   Aleve [Naproxen Sodium] Nausea And Vomiting and Other (See Comments)    No problem with other NSAIDs   Amoxicillin Other (See Comments)    Vertigo Did it involve swelling of the face/tongue/throat, SOB, or low BP? No Did it involve sudden or severe rash/hives, skin peeling, or any reaction on the inside of your mouth or nose? No Did you need to seek medical attention at a hospital or doctor's office? unknown When did it last happen?   yrs ago    dizziness If all above answers are "NO", may proceed with cephalosporin use.    Gabapentin Other (See Comments)    Might be sensitive to this- took 100 mg twice within a couple of hours = Ended up falling from a standing position and had to be taken to the E.D.   Morphine And Related Other (See Comments)    Dizziness    Penicillins Diarrhea and Other (See Comments)    Diarrhea Did it involve swelling of the face/tongue/throat, SOB, or low BP? No Did it involve sudden or severe rash/hives, skin peeling, or any reaction on the inside of your mouth or nose? No Did you need to seek medical attention at a hospital or doctor's office? Unknown When did it last happen?    years ago    If all above answers are "NO", may proceed with cephalosporin use.     Social History   Socioeconomic History   Marital status: Married    Spouse name: Financial risk analyst   Number of children: Not on file   Years of education: 12   Highest education level: High school graduate  Occupational History   Occupation: Retired  Tobacco Use   Smoking status: Former    Packs/day: 1.00    Years: 8.00    Total pack years: 8.00    Types: Cigarettes   Smokeless tobacco: Never  Vaping Use   Vaping Use: Never used  Substance and Sexual Activity  Alcohol use: Yes    Comment: occasional   Drug use: No   Sexual activity: Yes    Comment: married  Other Topics Concern   Not on file  Social History Narrative   Not on file   Social Determinants of Health   Financial Resource Strain: Low Risk  (09/05/2021)   Overall Financial Resource Strain (CARDIA)    Difficulty of Paying Living Expenses: Not hard at all  Food Insecurity: No Food Insecurity (09/05/2021)   Hunger Vital Sign    Worried About Running Out of Food in the Last Year: Never true    Ran Out of Food in the Last Year: Never true  Transportation Needs: No Transportation Needs (09/05/2021)   PRAPARE - Hydrologist (Medical): No    Lack of Transportation (Non-Medical): No  Physical Activity: Sufficiently Active (09/05/2021)   Exercise Vital Sign    Days of Exercise per Week: 5 days    Minutes of Exercise per Session: 30 min  Stress: No Stress Concern Present (09/05/2021)   [place]    Feeling of Stress : Not at all  Social Connections: [place] (09/05/2021)   Social Connection and Isolation Panel [NHANES]    Frequency of Communication with Friends and Family: More than three times a week    Frequency of Social Gatherings with Friends and Family: More than three times a week    Attends Religious Services: More than 4 times per year     Active Member of [employer] or Organizations: Yes    Attends Music therapist: More than 4 times per year    Marital Status: Married  Human resources officer Violence: Not At Risk (09/05/2021)   Humiliation, Afraid, Rape, and Kick questionnaire    Fear of Current or Ex-Partner: No    Emotionally Abused: No    Physically Abused: No    Sexually Abused: No    Family History  Problem Relation Age of Onset   Diabetes Sister    Diabetes Brother    Diabetes Sister    Cancer Sister        breast    Review of Systems:  As stated in the HPI and otherwise negative.   There were no vitals taken for this visit.  Physical Examination: General: Well developed, well nourished, NAD  HEENT: OP clear, mucus membranes moist  SKIN: warm, dry. No rashes. Neuro: No focal deficits  Musculoskeletal: Muscle strength 5/5 all ext  Psychiatric: Mood and affect normal  Neck: No JVD, no carotid bruits, no thyromegaly, no lymphadenopathy.  Lungs:Clear bilaterally, no wheezes, rhonci, crackles Cardiovascular: Regular rate and rhythm. No murmurs, gallops or rubs. Abdomen:Soft. Bowel sounds present. Non-tender.  Extremities: No lower extremity edema. Pulses are 2 + in the bilateral DP/PT.  EKG:  EKG is *** ordered today. The ekg ordered today demonstrates  Echo January 2023: 1. Left ventricular ejection fraction, by estimation, is 55%. The left  ventricle has normal function. The left ventricle has no regional wall  motion abnormalities. Left ventricular diastolic parameters are consistent  with Grade II diastolic dysfunction  (pseudonormalization). Elevated left ventricular end-diastolic pressure.   2. Right ventricular systolic function is normal. The right ventricular  size is normal.   3. Left atrial size was mildly dilated.   4. The mitral valve is degenerative. Trivial mitral valve regurgitation.  No evidence of mitral stenosis.   5. Gradients actually less than echo done 02/22/20 AS  still mild  . The  aortic valve is tricuspid. There is moderate calcification of the aortic  valve. There is moderate thickening of the aortic valve. Aortic valve  regurgitation is mild. Mild aortic valve   stenosis.   6. The inferior vena cava is normal in size with greater than 50%  respiratory variability, suggesting right atrial pressure of 3 mmHg.    Recent Labs: 04/06/2021: ALT 23; BUN 9; Creatinine, Ser 0.96; Hemoglobin 13.8; Platelets 159; Potassium 3.4; Sodium 133; TSH 1.778   Lipid Panel    Component Value Date/Time   CHOL 117 04/06/2021 0340   CHOL 112 11/09/2017 1440   TRIG 40 04/06/2021 0340   HDL 58 04/06/2021 0340   HDL 52 11/09/2017 1440   CHOLHDL 2.0 04/06/2021 0340   VLDL 8 04/06/2021 0340   LDLCALC 51 04/06/2021 0340   LDLCALC 57 10/08/2020 1138     Wt Readings from Last 3 Encounters:  01/06/22 147 lb (66.7 kg)  09/05/21 145 lb (65.8 kg)  08/15/21 145 lb 6.4 oz (66 kg)   Assessment and Plan:   1. CAD without angina: Admitted with anterior STEMI July 2019. A drug eluting stent was placed in the LAD. No chest pain. Continue ASA, beta blocker and statin.   2. Ischemic cardiomyopathy: LV function normal by echo January 2023. Continue beta blocker and ARB  3. Hyperlipidemia: LDL at goal January 2023. Continue statin  4. Aortic stenosis: Mild by echo January 2023. Repeat echo in January 2025.   Labs/ tests ordered today include:  No orders of the defined types were placed in this encounter.   Disposition:   F/U with me in 12 months.    Signed, Lauree Chandler, MD 01/26/2022 7:48 PM    [facility] [address], [place], [place]  [address] Phone: [phone]; Fax: [phone]

## 2022-01-27 ENCOUNTER — Ambulatory Visit: Payer: PPO | Attending: Cardiovascular Disease | Admitting: Cardiovascular Disease

## 2022-01-27 ENCOUNTER — Encounter: Payer: Self-pay | Admitting: Cardiovascular Disease

## 2022-01-27 VITALS — BP 130/68 | HR 68 | Ht 70.0 in | Wt 146.0 lb

## 2022-01-27 DIAGNOSIS — I255 Ischemic cardiomyopathy: Secondary | ICD-10-CM | POA: Diagnosis not present

## 2022-01-27 DIAGNOSIS — I350 Nonrheumatic aortic (valve) stenosis: Secondary | ICD-10-CM | POA: Diagnosis not present

## 2022-01-27 DIAGNOSIS — I2510 Atherosclerotic heart disease of native coronary artery without angina pectoris: Secondary | ICD-10-CM | POA: Diagnosis not present

## 2022-01-27 DIAGNOSIS — E7800 Pure hypercholesterolemia, unspecified: Secondary | ICD-10-CM | POA: Diagnosis not present

## 2022-01-27 NOTE — Patient Instructions (Signed)
Medication Instructions:  No changes *If you need a refill on your cardiac medications before your next appointment, please call your pharmacy*   Lab Work: none If you have labs (blood work) drawn today and your tests are completely normal, you will receive your results only by: MyChart Message (if you have MyChart) OR A paper copy in the mail If you have any lab test that is abnormal or we need to change your treatment, we will call you to review the results.   Testing/Procedures: none   Follow-Up: At [facility], you and your health needs are our priority.  As part of our continuing mission to provide you with exceptional heart care, we have created designated Provider Care Teams.  These Care Teams include your primary Cardiologist (physician) and Advanced Practice Providers (APPs -  Physician Assistants and Nurse Practitioners) who all work together to provide you with the care you need, when you need it.   Your next appointment:   12 month(s)  The format for your next appointment:   In Person  Provider:   Christopher McAlhany, MD     Important Information About Sugar       

## 2022-01-30 ENCOUNTER — Other Ambulatory Visit: Payer: Self-pay | Admitting: Cardiovascular Disease

## 2022-01-30 MED ORDER — LOSARTAN POTASSIUM 25 MG PO TABS: 25.0000 mg | ORAL_TABLET | Freq: Every day | ORAL | 3 refills | Status: DC

## 2022-02-11 DIAGNOSIS — H52203 Unspecified astigmatism, bilateral: Secondary | ICD-10-CM | POA: Diagnosis not present

## 2022-02-11 DIAGNOSIS — Z961 Presence of intraocular lens: Secondary | ICD-10-CM | POA: Diagnosis not present

## 2022-02-11 DIAGNOSIS — H353133 Nonexudative age-related macular degeneration, bilateral, advanced atrophic without subfoveal involvement: Secondary | ICD-10-CM | POA: Diagnosis not present

## 2022-02-17 ENCOUNTER — Ambulatory Visit: Payer: PPO | Admitting: Podiatry

## 2022-03-31 ENCOUNTER — Ambulatory Visit: Payer: PPO | Admitting: Family Medicine

## 2022-04-11 ENCOUNTER — Encounter: Payer: Self-pay | Admitting: Family Medicine

## 2022-04-11 ENCOUNTER — Ambulatory Visit (INDEPENDENT_AMBULATORY_CARE_PROVIDER_SITE_OTHER): Payer: PPO | Admitting: Family Medicine

## 2022-04-11 VITALS — BP 118/62 | HR 63 | Temp 97.5°F | Ht 70.0 in | Wt 144.6 lb

## 2022-04-11 DIAGNOSIS — K52831 Collagenous colitis: Secondary | ICD-10-CM | POA: Diagnosis not present

## 2022-04-11 DIAGNOSIS — M545 Low back pain, unspecified: Secondary | ICD-10-CM

## 2022-04-11 DIAGNOSIS — I5042 Chronic combined systolic (congestive) and diastolic (congestive) heart failure: Secondary | ICD-10-CM | POA: Diagnosis not present

## 2022-04-11 DIAGNOSIS — E785 Hyperlipidemia, unspecified: Secondary | ICD-10-CM | POA: Diagnosis not present

## 2022-04-11 LAB — CBC WITH DIFFERENTIAL/PLATELET
Absolute Monocytes: 832 cells/uL (ref 200–950)
Basophils Absolute: 46 cells/uL (ref 0–200)
Basophils Relative: 0.4 %
Eosinophils Absolute: 205 cells/uL (ref 15–500)
Eosinophils Relative: 1.8 %
HCT: 41.6 % (ref 38.5–50.0)
Hemoglobin: 14.4 g/dL (ref 13.2–17.1)
Lymphs Abs: 1984 cells/uL (ref 850–3900)
MCH: 34.1 pg — ABNORMAL HIGH (ref 27.0–33.0)
MCHC: 34.6 g/dL (ref 32.0–36.0)
MCV: 98.6 fL (ref 80.0–100.0)
MPV: 9.3 fL (ref 7.5–12.5)
Monocytes Relative: 7.3 %
Neutro Abs: 8333 cells/uL — ABNORMAL HIGH (ref 1500–7800)
Neutrophils Relative %: 73.1 %
Platelets: 273 10*3/uL (ref 140–400)
RBC: 4.22 10*6/uL (ref 4.20–5.80)
RDW: 11.7 % (ref 11.0–15.0)
Total Lymphocyte: 17.4 %
WBC: 11.4 10*3/uL — ABNORMAL HIGH (ref 3.8–10.8)

## 2022-04-11 LAB — COMPLETE METABOLIC PANEL WITH GFR
AG Ratio: 1.9 (calc) (ref 1.0–2.5)
ALT: 24 U/L (ref 9–46)
AST: 22 U/L (ref 10–35)
Albumin: 3.7 g/dL (ref 3.6–5.1)
Alkaline phosphatase (APISO): 65 U/L (ref 35–144)
BUN: 10 mg/dL (ref 7–25)
CO2: 28 mmol/L (ref 20–32)
Calcium: 8.8 mg/dL (ref 8.6–10.3)
Chloride: 103 mmol/L (ref 98–110)
Creat: 0.88 mg/dL (ref 0.70–1.22)
Globulin: 2 g/dL (calc) (ref 1.9–3.7)
Glucose, Bld: 113 mg/dL — ABNORMAL HIGH (ref 65–99)
Potassium: 4.2 mmol/L (ref 3.5–5.3)
Sodium: 138 mmol/L (ref 135–146)
Total Bilirubin: 0.9 mg/dL (ref 0.2–1.2)
Total Protein: 5.7 g/dL — ABNORMAL LOW (ref 6.1–8.1)
eGFR: 82 mL/min/{1.73_m2} (ref >=60)

## 2022-04-11 LAB — LIPID PANEL
Cholesterol: 133 mg/dL (ref <200)
HDL: 69 mg/dL (ref >=40)
LDL Cholesterol (Calc): 50 mg/dL (calc)
Non-HDL Cholesterol (Calc): 64 mg/dL (calc) (ref <130)
Total CHOL/HDL Ratio: 1.9 (calc) (ref <5.0)
Triglycerides: 59 mg/dL (ref <150)

## 2022-04-11 MED ORDER — ALPRAZOLAM 0.5 MG PO TABS: 0.5000 mg | ORAL_TABLET | Freq: Every evening | ORAL | 3 refills | Status: AC | PRN

## 2022-04-11 MED ORDER — CHOLESTYRAMINE 4 GM/DOSE PO POWD: 4.0000 g | Freq: Every day | ORAL | 1 refills | Status: DC | PRN

## 2022-04-11 NOTE — Progress Notes (Signed)
Subjective:    Patient ID: Eugene [name], male    DOB: 1931-04-08, 89 y.o.   MRN: [id number]  Patient is an 89 year old Caucasian gentleman here today for a checkup.  He has a history of coronary artery disease status postmyocardial infarction in 2019.  He had a drug-eluting stent placed in his mid LAD.  He is due for fasting lab work to monitor his cholesterol.  He is requesting a refill on cholestyramine.  He has a history of collagenous colitis.  He uses cholestyramine flares to help manage the diarrhea.  He states that the medication works well when he needs to take it.  He presents here to use this sparingly to help with sleep and occasionally for anxiety.  I cautioned the patient that at his age it increases his risk of falls and confusion however he denies any side effects from the medication.  I want him to be very careful when he takes it to avoid unintended falls.  He does complain of pain in his left shoulder.  He is able to abduct his arm 160 degrees without pain.  He has a negative empty can sign.  He has negative Hawkins sign.  He does have crepitus with range of motion.  I believe the pain is likely arthritic.  He also complains of bilateral low back pain in a bandlike fashion roughly around the level of L5.  He states that when he stands it radiates into his hips.  It hurts the longer he stands.  He denies any numbness or tingling in his legs.  He denies any saddle anesthesia. Past Medical History:  Diagnosis Date   Amputated right leg ([place])    CAD (coronary artery disease)    a. 09/2017 - acute MI -  emergent cath showing 99% stenosis in midLAD with thrombotic stenosis at the bifurcation of the second diagonal, which was relatively small in diameter. He underwent placement of DES to midLAD which jailed the second diagonal.    Chronic combined systolic and diastolic heart failure ([place])    Collagenous colitis    Dr. Festus Holts   History of ST elevation myocardial infarction (STEMI)     09/2017: LAD   History of stomach ulcers    Hyperlipidemia LDL goal <70    Ischemic cardiomyopathy    Squamous cell carcinoma of skin 03/27/2014   in situ-left temple (txpbx)   Squamous cell carcinoma of skin 02/24/2018   in situ-left cheek (CX35FU)   Past Surgical History:  Procedure Laterality Date   BACK SURGERY     CARDIAC CATHETERIZATION     CORONARY STENT INTERVENTION N/A 10/07/2017   Procedure: CORONARY STENT INTERVENTION;  Surgeon: Wellington Hampshire, MD;  Location: [facility];  Service: Cardiovascular;  Laterality: N/A;   CORONARY/GRAFT ACUTE MI REVASCULARIZATION N/A 10/07/2017   Procedure: Coronary/Graft Acute MI Revascularization;  Surgeon: Wellington Hampshire, MD;  Location: [facility];  Service: Cardiovascular;  Laterality: N/A;   LEFT HEART CATH AND CORONARY ANGIOGRAPHY N/A 10/07/2017   Procedure: LEFT HEART CATH AND CORONARY ANGIOGRAPHY;  Surgeon: Wellington Hampshire, MD;  Location: [facility];  Service: Cardiovascular;  Laterality: N/A;   LEG AMPUTATION     Right Below knee   STOMACH SURGERY     Billroth II gastrojejunostomy   TOTAL KNEE ARTHROPLASTY     Current Outpatient Medications on File Prior to Visit  Medication Sig Dispense Refill   aspirin 81 MG tablet Take 81 mg by mouth  daily.     budesonide (ENTOCORT EC) 3 MG 24 hr capsule Take 2 capsules (6 mg total) by mouth daily. 180 capsule 3   carvedilol (COREG) 3.125 MG tablet TAKE 1 TABLET BY MOUTH TWICE A DAY WITH MEALS 180 tablet 1   fluorouracil (EFUDEX) 5 % cream Apply 1 application topically daily as needed (as directed- affected sites).     gabapentin (NEURONTIN) 100 MG capsule Take 1 capsule (100 mg total) by mouth 3 (three) times daily as needed (nerve pain). 90 capsule 3   losartan (COZAAR) 25 MG tablet Take 1 tablet (25 mg total) by mouth daily. 90 tablet 3   mometasone (ELOCON) 0.1 % cream Apply topically daily. 45 g 1   Multiple Vitamins-Minerals (PRESERVISION AREDS PO) Take 1 capsule by  mouth 2 (two) times daily.     nitroGLYCERIN (NITROSTAT) 0.4 MG SL tablet PLACE 1 TABLET (0.4 MG TOTAL) UNDER THE TONGUE EVERY 5 MINUTES X 3 DOSES AS NEEDED FOR CHEST PAIN. 25 tablet 3   Omega-3 Fatty Acids ([employer] FISH OIL) 1200 MG CAPS Take 1,200 mg by mouth daily with breakfast.     rosuvastatin (CRESTOR) 20 MG tablet Take 1 tablet (20 mg total) by mouth daily. (Patient taking differently: Take 20 mg by mouth in the morning.) 90 tablet 3   sulfamethoxazole-trimethoprim (BACTRIM DS) 800-160 MG tablet Take 1 tablet by mouth 2 (two) times daily. 14 tablet 0   triamcinolone cream (KENALOG) 0.1 % APPLY 1 APPLICATION. TOPICALLY 2 (TWO) TIMES DAILY. 60 g 0   No current facility-administered medications on file prior to visit.   Allergies  Allergen Reactions   Aleve [Naproxen Sodium] Nausea And Vomiting and Other (See Comments)    No problem with other NSAIDs   Amoxicillin Other (See Comments)    Vertigo Did it involve swelling of the face/tongue/throat, SOB, or low BP? No Did it involve sudden or severe rash/hives, skin peeling, or any reaction on the inside of your mouth or nose? No Did you need to seek medical attention at a hospital or doctor's office? unknown When did it last happen?   yrs ago    dizziness If all above answers are "NO", may proceed with cephalosporin use.    Gabapentin Other (See Comments)    Might be sensitive to this- took 100 mg twice within a couple of hours = Ended up falling from a standing position and had to be taken to the E.D.   Morphine And Related Other (See Comments)    Dizziness    Penicillins Diarrhea and Other (See Comments)    Diarrhea Did it involve swelling of the face/tongue/throat, SOB, or low BP? No Did it involve sudden or severe rash/hives, skin peeling, or any reaction on the inside of your mouth or nose? No Did you need to seek medical attention at a hospital or doctor's office? Unknown When did it last happen?    years ago   If all above  answers are "NO", may proceed with cephalosporin use.    Social History   Socioeconomic History   Marital status: Married    Spouse name: Financial risk analyst   Number of children: Not on file   Years of education: 12   Highest education level: High school graduate  Occupational History   Occupation: Retired  Tobacco Use   Smoking status: Former    Packs/day: 1.00    Years: 8.00    Total pack years: 8.00    Types: Cigarettes   Smokeless tobacco: Never  Vaping Use   Vaping Use: Never used  Substance and Sexual Activity   Alcohol use: Yes    Comment: occasional   Drug use: No   Sexual activity: Yes    Comment: married  Other Topics Concern   Not on file  Social History Narrative   Not on file   Social Determinants of Health   Financial Resource Strain: Low Risk  (09/05/2021)   Overall Financial Resource Strain (CARDIA)    Difficulty of Paying Living Expenses: Not hard at all  Food Insecurity: No Food Insecurity (09/05/2021)   Hunger Vital Sign    Worried About Running Out of Food in the Last Year: Never true    Ran Out of Food in the Last Year: Never true  Transportation Needs: No Transportation Needs (09/05/2021)   PRAPARE - Hydrologist (Medical): No    Lack of Transportation (Non-Medical): No  Physical Activity: Sufficiently Active (09/05/2021)   Exercise Vital Sign    Days of Exercise per Week: 5 days    Minutes of Exercise per Session: 30 min  Stress: No Stress Concern Present (09/05/2021)   [place]    Feeling of Stress : Not at all  Social Connections: [place] (09/05/2021)   Social Connection and Isolation Panel [NHANES]    Frequency of Communication with Friends and Family: More than three times a week    Frequency of Social Gatherings with Friends and Family: More than three times a week    Attends Religious Services: More than 4 times per year    Active Member of  [employer] or Organizations: Yes    Attends Archivist Meetings: More than 4 times per year    Marital Status: Married  Human resources officer Violence: Not At Risk (09/05/2021)   Humiliation, Afraid, Rape, and Kick questionnaire    Fear of Current or Ex-Partner: No    Emotionally Abused: No    Physically Abused: No    Sexually Abused: No   Review of Systems  All other systems reviewed and are negative.      Objective:   Physical Exam Vitals reviewed.  Cardiovascular:     Rate and Rhythm: Normal rate and regular rhythm.     Heart sounds: Normal heart sounds.  Pulmonary:     Effort: Pulmonary effort is normal. No respiratory distress.     Breath sounds: Normal breath sounds. No wheezing or rales.  Musculoskeletal:     Right shoulder: Crepitus present. No tenderness or bony tenderness. Normal range of motion. Normal strength.     Left shoulder: No tenderness or bony tenderness. Normal range of motion. Normal strength.     Lumbar back: No spasms, tenderness or bony tenderness. Decreased range of motion.       Back:  Skin:    Findings: Erythema and rash present.    Right below the knee amputation. Please see history of present illness for further information      Assessment & Plan:  Hyperlipidemia LDL goal <70 - Plan: CBC with Differential/Platelet, Lipid panel, COMPLETE METABOLIC PANEL WITH GFR  Collagenous colitis  Chronic combined systolic and diastolic heart failure (HCC) - Plan: CBC with Differential/Platelet, Lipid panel, COMPLETE METABOLIC PANEL WITH GFR  Acute midline low back pain without sciatica - Plan: DG Lumbar Spine Complete I will be happy to refill his cholestyramine that he uses as needed for diarrhea.  I also refilled his Xanax but I cautioned  him to be careful to avoid falls.  I warned him that the medication can make him dizzy and sleepy.  He uses this sparingly.  Given his history of coronary artery disease I would like to check a CBC a CMP and a lipid  panel.  His goal LDL cholesterol is less than 55.  Monitor his renal function.  Liver function test.  I believe his shoulder pain is likely due to osteoarthritis.  I recommended Tylenol 1000 mg twice daily for this given his history of coronary artery disease OM2 avoid NSAIDs.  I believe the low back pain is likely due to degenerative disc disease.  Start by obtaining an x-ray of the lumbar spine to evaluate further.  He can use Tylenol 1000 mg twice daily for this as well

## 2022-04-22 ENCOUNTER — Encounter: Payer: Self-pay | Admitting: Family Medicine

## 2022-04-22 ENCOUNTER — Ambulatory Visit (INDEPENDENT_AMBULATORY_CARE_PROVIDER_SITE_OTHER): Payer: PPO | Admitting: Family Medicine

## 2022-04-22 VITALS — BP 126/68 | HR 74 | Temp 98.6°F | Ht 70.0 in | Wt 146.0 lb

## 2022-04-22 DIAGNOSIS — M25512 Pain in left shoulder: Secondary | ICD-10-CM

## 2022-04-22 NOTE — Progress Notes (Signed)
Subjective:    Patient ID: Eugene [name], male    DOB: 1931/07/03, 89 y.o.   MRN: [id number]  Patient presents today complaining of left shoulder pain.  He has pain with abduction greater than 100 degrees.  He denies any pain with internal or external rotation.  He has pain with empty can testing.  He has no pain with Hawkins maneuver.  There is no crepitus with passive range of motion in the shoulder.  He has a negative Spurling sign.  There is no erythema or warmth.  The pain does keep him awake at night.  He is requesting a cortisone injection in the shoulder.  Past Medical History:  Diagnosis Date   Amputated right leg ([employer])    CAD (coronary artery disease)    a. 09/2017 - acute MI -  emergent cath showing 99% stenosis in midLAD with thrombotic stenosis at the bifurcation of the second diagonal, which was relatively small in diameter. He underwent placement of DES to midLAD which jailed the second diagonal.    Chronic combined systolic and diastolic heart failure ([place])    Collagenous colitis    Dr. Festus Holts   History of ST elevation myocardial infarction (STEMI)    09/2017: LAD   History of stomach ulcers    Hyperlipidemia LDL goal <70    Ischemic cardiomyopathy    Squamous cell carcinoma of skin 03/27/2014   in situ-left temple (txpbx)   Squamous cell carcinoma of skin 02/24/2018   in situ-left cheek (CX35FU)   Past Surgical History:  Procedure Laterality Date   BACK SURGERY     CARDIAC CATHETERIZATION     CORONARY STENT INTERVENTION N/A 10/07/2017   Procedure: CORONARY STENT INTERVENTION;  Surgeon: Wellington Hampshire, MD;  Location: [facility];  Service: Cardiovascular;  Laterality: N/A;   CORONARY/GRAFT ACUTE MI REVASCULARIZATION N/A 10/07/2017   Procedure: Coronary/Graft Acute MI Revascularization;  Surgeon: Wellington Hampshire, MD;  Location: [facility];  Service: Cardiovascular;  Laterality: N/A;   LEFT HEART CATH AND CORONARY ANGIOGRAPHY N/A 10/07/2017    Procedure: LEFT HEART CATH AND CORONARY ANGIOGRAPHY;  Surgeon: Wellington Hampshire, MD;  Location: [facility];  Service: Cardiovascular;  Laterality: N/A;   LEG AMPUTATION     Right Below knee   STOMACH SURGERY     Billroth II gastrojejunostomy   TOTAL KNEE ARTHROPLASTY     Current Outpatient Medications on File Prior to Visit  Medication Sig Dispense Refill   ALPRAZolam (XANAX) 0.5 MG tablet Take 1 tablet (0.5 mg total) by mouth at bedtime as needed. for sleep 30 tablet 3   aspirin 81 MG tablet Take 81 mg by mouth daily.     budesonide (ENTOCORT EC) 3 MG 24 hr capsule Take 2 capsules (6 mg total) by mouth daily. 180 capsule 3   carvedilol (COREG) 3.125 MG tablet TAKE 1 TABLET BY MOUTH TWICE A DAY WITH MEALS 180 tablet 1   cholestyramine (QUESTRAN) 4 GM/DOSE powder Take 1 packet (4 g total) by mouth daily as needed (for colitis flares (mix and drink)). 348.6 g 1   fluorouracil (EFUDEX) 5 % cream Apply 1 application topically daily as needed (as directed- affected sites).     gabapentin (NEURONTIN) 100 MG capsule Take 1 capsule (100 mg total) by mouth 3 (three) times daily as needed (nerve pain). 90 capsule 3   losartan (COZAAR) 25 MG tablet Take 1 tablet (25 mg total) by mouth daily. 90 tablet 3  mometasone (ELOCON) 0.1 % cream Apply topically daily. 45 g 1   Multiple Vitamins-Minerals (PRESERVISION AREDS PO) Take 1 capsule by mouth 2 (two) times daily.     nitroGLYCERIN (NITROSTAT) 0.4 MG SL tablet PLACE 1 TABLET (0.4 MG TOTAL) UNDER THE TONGUE EVERY 5 MINUTES X 3 DOSES AS NEEDED FOR CHEST PAIN. 25 tablet 3   Omega-3 Fatty Acids ([employer] FISH OIL) 1200 MG CAPS Take 1,200 mg by mouth daily with breakfast.     rosuvastatin (CRESTOR) 20 MG tablet Take 1 tablet (20 mg total) by mouth daily. (Patient taking differently: Take 20 mg by mouth in the morning.) 90 tablet 3   sulfamethoxazole-trimethoprim (BACTRIM DS) 800-160 MG tablet Take 1 tablet by mouth 2 (two) times daily. 14 tablet 0    triamcinolone cream (KENALOG) 0.1 % APPLY 1 APPLICATION. TOPICALLY 2 (TWO) TIMES DAILY. 60 g 0   No current facility-administered medications on file prior to visit.   Allergies  Allergen Reactions   Aleve [Naproxen Sodium] Nausea And Vomiting and Other (See Comments)    No problem with other NSAIDs   Amoxicillin Other (See Comments)    Vertigo Did it involve swelling of the face/tongue/throat, SOB, or low BP? No Did it involve sudden or severe rash/hives, skin peeling, or any reaction on the inside of your mouth or nose? No Did you need to seek medical attention at a hospital or doctor's office? unknown When did it last happen?   yrs ago    dizziness If all above answers are "NO", may proceed with cephalosporin use.    Gabapentin Other (See Comments)    Might be sensitive to this- took 100 mg twice within a couple of hours = Ended up falling from a standing position and had to be taken to the E.D.   Morphine And Related Other (See Comments)    Dizziness    Penicillins Diarrhea and Other (See Comments)    Diarrhea Did it involve swelling of the face/tongue/throat, SOB, or low BP? No Did it involve sudden or severe rash/hives, skin peeling, or any reaction on the inside of your mouth or nose? No Did you need to seek medical attention at a hospital or doctor's office? Unknown When did it last happen?    years ago   If all above answers are "NO", may proceed with cephalosporin use.    Social History   Socioeconomic History   Marital status: Married    Spouse name: Financial risk analyst   Number of children: Not on file   Years of education: 12   Highest education level: High school graduate  Occupational History   Occupation: Retired  Tobacco Use   Smoking status: Former    Packs/day: 1.00    Years: 8.00    Total pack years: 8.00    Types: Cigarettes   Smokeless tobacco: Never  Vaping Use   Vaping Use: Never used  Substance and Sexual Activity   Alcohol use: Yes    Comment:  occasional   Drug use: No   Sexual activity: Yes    Comment: married  Other Topics Concern   Not on file  Social History Narrative   Not on file   Social Determinants of Health   Financial Resource Strain: Low Risk  (09/05/2021)   Overall Financial Resource Strain (CARDIA)    Difficulty of Paying Living Expenses: Not hard at all  Food Insecurity: No Food Insecurity (09/05/2021)   Hunger Vital Sign    Worried About Running Out of Food  in the Last Year: Never true    [facility] in the Last Year: Never true  Transportation Needs: No Transportation Needs (09/05/2021)   PRAPARE - Hydrologist (Medical): No    Lack of Transportation (Non-Medical): No  Physical Activity: Sufficiently Active (09/05/2021)   Exercise Vital Sign    Days of Exercise per Week: 5 days    Minutes of Exercise per Session: 30 min  Stress: No Stress Concern Present (09/05/2021)   [place]    Feeling of Stress : Not at all  Social Connections: [place] (09/05/2021)   Social Connection and Isolation Panel [NHANES]    Frequency of Communication with Friends and Family: More than three times a week    Frequency of Social Gatherings with Friends and Family: More than three times a week    Attends Religious Services: More than 4 times per year    Active Member of [employer] or Organizations: Yes    Attends Archivist Meetings: More than 4 times per year    Marital Status: Married  Human resources officer Violence: Not At Risk (09/05/2021)   Humiliation, Afraid, Rape, and Kick questionnaire    Fear of Current or Ex-Partner: No    Emotionally Abused: No    Physically Abused: No    Sexually Abused: No   Review of Systems  All other systems reviewed and are negative.      Objective:   Physical Exam Vitals reviewed.  Cardiovascular:     Rate and Rhythm: Normal rate and regular rhythm.     Heart sounds:  Normal heart sounds.  Pulmonary:     Effort: Pulmonary effort is normal. No respiratory distress.     Breath sounds: Normal breath sounds. No wheezing or rales.  Musculoskeletal:     Left shoulder: No swelling, tenderness, bony tenderness or crepitus. Decreased range of motion. Normal strength.    Right below the knee amputation. Please see history of present illness for further information      Assessment & Plan:  Acute pain of left shoulder I suspect the patient has subacromial bursitis in his left shoulder.  Using sterile technique, I injected the left subacromial space with 2 cc of lidocaine, 2 cc of Marcaine, and 2 cc of 40 mg/mL Kenalog.  The patient tolerated the procedure well without complication

## 2022-04-30 DIAGNOSIS — L821 Other seborrheic keratosis: Secondary | ICD-10-CM | POA: Diagnosis not present

## 2022-04-30 DIAGNOSIS — L814 Other melanin hyperpigmentation: Secondary | ICD-10-CM | POA: Diagnosis not present

## 2022-04-30 DIAGNOSIS — Z08 Encounter for follow-up examination after completed treatment for malignant neoplasm: Secondary | ICD-10-CM | POA: Diagnosis not present

## 2022-04-30 DIAGNOSIS — L853 Xerosis cutis: Secondary | ICD-10-CM | POA: Diagnosis not present

## 2022-04-30 DIAGNOSIS — C44229 Squamous cell carcinoma of skin of left ear and external auricular canal: Secondary | ICD-10-CM | POA: Diagnosis not present

## 2022-04-30 DIAGNOSIS — D225 Melanocytic nevi of trunk: Secondary | ICD-10-CM | POA: Diagnosis not present

## 2022-04-30 DIAGNOSIS — L820 Inflamed seborrheic keratosis: Secondary | ICD-10-CM | POA: Diagnosis not present

## 2022-04-30 DIAGNOSIS — D485 Neoplasm of uncertain behavior of skin: Secondary | ICD-10-CM | POA: Diagnosis not present

## 2022-04-30 DIAGNOSIS — L723 Sebaceous cyst: Secondary | ICD-10-CM | POA: Diagnosis not present

## 2022-04-30 DIAGNOSIS — B356 Tinea cruris: Secondary | ICD-10-CM | POA: Diagnosis not present

## 2022-04-30 DIAGNOSIS — L538 Other specified erythematous conditions: Secondary | ICD-10-CM | POA: Diagnosis not present

## 2022-04-30 DIAGNOSIS — Z85828 Personal history of other malignant neoplasm of skin: Secondary | ICD-10-CM | POA: Diagnosis not present

## 2022-05-01 ENCOUNTER — Other Ambulatory Visit: Payer: Self-pay | Admitting: Family Medicine

## 2022-05-01 ENCOUNTER — Other Ambulatory Visit: Payer: Self-pay | Admitting: Cardiovascular Disease

## 2022-05-01 MED ORDER — ROSUVASTATIN CALCIUM 20 MG PO TABS: 20.0000 mg | ORAL_TABLET | Freq: Every day | ORAL | 3 refills | Status: DC

## 2022-05-01 NOTE — Telephone Encounter (Signed)
Requested Prescriptions  Pending Prescriptions Disp Refills   gabapentin (NEURONTIN) 100 MG capsule [Pharmacy Med Name: GABAPENTIN 100 MG CAPSULE] 90 capsule 3    Sig: TAKE 1 CAPSULE (100 MG TOTAL) BY MOUTH 3 (THREE) TIMES DAILY AS NEEDED (NERVE PAIN).     Neurology: Anticonvulsants - gabapentin Passed - 05/01/2022  8:45 AM      Passed - Cr in normal range and within 360 days    Creat  Date Value Ref Range Status  04/11/2022 0.88 0.70 - 1.22 mg/dL Final         Passed - Completed PHQ-2 or PHQ-9 in the last 360 days      Passed - Valid encounter within last 12 months    Recent Outpatient Visits           8 months ago Collagenous colitis   [place] Susy Frizzle, MD   9 months ago Cellulitis of left lower extremity   [place] Dennard Schaumann Cammie Mcgee, MD   9 months ago Laceration of left lower extremity, initial encounter   [place] Pickard, Cammie Mcgee, MD   10 months ago Irritant dermatitis   [place] Pickard, Cammie Mcgee, MD   11 months ago Irritant dermatitis   [place] Pickard, Cammie Mcgee, MD

## 2022-05-01 NOTE — Telephone Encounter (Signed)
Requested Prescriptions  Pending Prescriptions Disp Refills   rosuvastatin (CRESTOR) 20 MG tablet 90 tablet 3    Sig: Take 1 tablet (20 mg total) by mouth daily.     Cardiovascular:  Antilipid - Statins 2 Failed - 05/01/2022 10:22 AM      Failed - Lipid Panel in normal range within the last 12 months    Cholesterol, Total  Date Value Ref Range Status  11/09/2017 112 100 - 199 mg/dL Final   Cholesterol  Date Value Ref Range Status  04/11/2022 133 <200 mg/dL Final   LDL Cholesterol (Calc)  Date Value Ref Range Status  04/11/2022 50 mg/dL (calc) Final    Comment:    Reference range: <100 . Desirable range <100 mg/dL for primary prevention;   <70 mg/dL for patients with CHD or diabetic patients  with > or = 2 CHD risk factors. Marland Kitchen LDL-C is now calculated using the Martin-Hopkins  calculation, which is a validated novel method providing  better accuracy than the Friedewald equation in the  estimation of LDL-C.  Cresenciano Genre et al. Annamaria Helling. [id number]): 2061-2068  (http://education.[facility].com/faq/FAQ164)    HDL  Date Value Ref Range Status  04/11/2022 69 > OR = 40 mg/dL Final  11/09/2017 52 >39 mg/dL Final   Triglycerides  Date Value Ref Range Status  04/11/2022 59 <150 mg/dL Final         Passed - Cr in normal range and within 360 days    Creat  Date Value Ref Range Status  04/11/2022 0.88 0.70 - 1.22 mg/dL Final         Passed - Patient is not pregnant      Passed - Valid encounter within last 12 months    Recent Outpatient Visits           8 months ago Collagenous colitis   [place] Susy Frizzle, MD   9 months ago Cellulitis of left lower extremity   [place] Dennard Schaumann Cammie Mcgee, MD   9 months ago Laceration of left lower extremity, initial encounter   [place] Pickard, Cammie Mcgee, MD   10 months ago Irritant dermatitis   [place] Pickard, Cammie Mcgee, MD   11 months ago  Irritant dermatitis   [place] Pickard, Cammie Mcgee, MD

## 2022-05-01 NOTE — Telephone Encounter (Signed)
Prescription Request  05/01/2022  Is this a "Controlled Substance" medicine? No  LOV: 04/22/2022  What is the name of the medication or equipment?   rosuvastatin (CRESTOR) 20 MG tablet   Have you contacted your pharmacy to request a refill? Yes   Which pharmacy would you like this sent to?  [id number]: [phone]: [phone]   Patient notified that their request is being sent to the clinical staff for review and that they should receive a response within 2 business days.   Please advise pharmacist at [phone]

## 2022-05-07 DIAGNOSIS — C44222 Squamous cell carcinoma of skin of right ear and external auricular canal: Secondary | ICD-10-CM | POA: Diagnosis not present

## 2022-05-07 DIAGNOSIS — D0422 Carcinoma in situ of skin of left ear and external auricular canal: Secondary | ICD-10-CM | POA: Diagnosis not present

## 2022-05-07 DIAGNOSIS — D485 Neoplasm of uncertain behavior of skin: Secondary | ICD-10-CM | POA: Diagnosis not present

## 2022-05-08 ENCOUNTER — Other Ambulatory Visit: Payer: Self-pay

## 2022-05-08 ENCOUNTER — Emergency Department (HOSPITAL_COMMUNITY)
Admission: EM | Admit: 2022-05-08 | Discharge: 2022-05-08 | Disposition: A | Discharge status: Home / Self Care | Payer: PPO | Attending: Emergency Medicine | Admitting: Emergency Medicine

## 2022-05-08 ENCOUNTER — Encounter (HOSPITAL_COMMUNITY): Payer: Self-pay

## 2022-05-08 ENCOUNTER — Encounter: Payer: Self-pay | Admitting: Emergency Medicine

## 2022-05-08 ENCOUNTER — Ambulatory Visit
Admission: EM | Admit: 2022-05-08 | Discharge: 2022-05-08 | Disposition: A | Discharge status: Home / Self Care | Payer: PPO | Attending: Nurse Practitioner | Admitting: Nurse Practitioner

## 2022-05-08 DIAGNOSIS — Z79899 Other long term (current) drug therapy: Secondary | ICD-10-CM | POA: Diagnosis not present

## 2022-05-08 DIAGNOSIS — S41111A Laceration without foreign body of right upper arm, initial encounter: Secondary | ICD-10-CM | POA: Insufficient documentation

## 2022-05-08 DIAGNOSIS — W19XXXA Unspecified fall, initial encounter: Secondary | ICD-10-CM | POA: Diagnosis not present

## 2022-05-08 DIAGNOSIS — Z23 Encounter for immunization: Secondary | ICD-10-CM | POA: Insufficient documentation

## 2022-05-08 DIAGNOSIS — R55 Syncope and collapse: Secondary | ICD-10-CM

## 2022-05-08 DIAGNOSIS — Y92000 Kitchen of unspecified non-institutional (private) residence as  the place of occurrence of the external cause: Secondary | ICD-10-CM | POA: Diagnosis not present

## 2022-05-08 DIAGNOSIS — Z4800 Encounter for change or removal of nonsurgical wound dressing: Secondary | ICD-10-CM | POA: Diagnosis not present

## 2022-05-08 DIAGNOSIS — I509 Heart failure, unspecified: Secondary | ICD-10-CM | POA: Diagnosis not present

## 2022-05-08 DIAGNOSIS — Z5189 Encounter for other specified aftercare: Secondary | ICD-10-CM

## 2022-05-08 DIAGNOSIS — Z7982 Long term (current) use of aspirin: Secondary | ICD-10-CM | POA: Insufficient documentation

## 2022-05-08 DIAGNOSIS — S4991XA Unspecified injury of right shoulder and upper arm, initial encounter: Secondary | ICD-10-CM | POA: Diagnosis present

## 2022-05-08 DIAGNOSIS — I2510 Atherosclerotic heart disease of native coronary artery without angina pectoris: Secondary | ICD-10-CM | POA: Diagnosis not present

## 2022-05-08 MED ORDER — HYDROGEN PEROXIDE 3 % EX SOLN
CUTANEOUS | Status: AC
  Filled 2022-05-08: qty 473

## 2022-05-08 MED ORDER — TETANUS-DIPHTH-ACELL PERTUSSIS 5-2.5-18.5 LF-MCG/0.5 IM SUSY
0.5000 mL | PREFILLED_SYRINGE | Freq: Once | INTRAMUSCULAR | Status: AC
  Administered 2022-05-08: 0.5 mL via INTRAMUSCULAR
  Filled 2022-05-08: qty 0.5

## 2022-05-08 NOTE — Discharge Instructions (Signed)
Keep your wound covered until Dr. Dennard Schaumann sees you tomorrow.  You will need to change the dressing once daily until a good scab has formed on the deeper sites.      Your tetanus has been updated today.

## 2022-05-08 NOTE — ED Notes (Signed)
Gauze is adhered to patient's skin tear on right elbow.  Gauze moistened with saline to lossen from skin without success.  Vaseline gauze applied over top of gauze to see if that will loosen from skin.

## 2022-05-08 NOTE — Discharge Instructions (Signed)
Go to the emergency for further evaluation.

## 2022-05-08 NOTE — ED Triage Notes (Signed)
Fell yesterday due to dizziness.  Had ear skin surgery yesterday morning.  C/o right elbow pain.  Skin tear to right elbow.

## 2022-05-08 NOTE — ED Notes (Signed)
Unable to get guaze released from skin.

## 2022-05-08 NOTE — ED Provider Notes (Addendum)
[facility]    CSN: [id number] Arrival date & time: 05/08/22  1157      History   Chief Complaint No chief complaint on file.   HPI Eugene [name] is a 89 y.o. male.   The history is provided by the patient and a relative.   The patient presents with his daughter for complaints of an injury to the right arm after the patient fell last evening.  Patient's daughter states the patient had an episode where he "passed out".  She states that he fell onto the right arm causing a skin tear.  After the fall, patient's wife did call EMS, and first responders came out.  Patient states that he cannot transport to the emergency department.  The entire to the right arm was wrapped with gauze.  Patient and his daughter present today stating they could not remove the gauze.  Patient states that he is unsure what caused him to pass out yesterday.  The patient had a Mohs procedure to the left ear, he states that he came home and ate [employer], but then passed out.  Patient's daughter states that she was not there when the episode occurred.  Patient does have a history of heart disease and TIA.  Patient currently denies fever, chills, headache, dizziness, lightheadedness, blurred vision, chest pain, cough, abdominal pain, nausea, vomiting, or diarrhea.  Past Medical History:  Diagnosis Date   Amputated right leg ([place])    CAD (coronary artery disease)    a. 09/2017 - acute MI -  emergent cath showing 99% stenosis in midLAD with thrombotic stenosis at the bifurcation of the second diagonal, which was relatively small in diameter. He underwent placement of DES to midLAD which jailed the second diagonal.    Chronic combined systolic and diastolic heart failure (HCC)    Collagenous colitis    Dr. Festus Holts   History of ST elevation myocardial infarction (STEMI)    09/2017: LAD   History of stomach ulcers    Hyperlipidemia LDL goal <70    Ischemic cardiomyopathy    Squamous cell carcinoma of  skin 03/27/2014   in situ-left temple (txpbx)   Squamous cell carcinoma of skin 02/24/2018   in situ-left cheek ([id number])    Patient Active Problem List   Diagnosis Date Noted   COVID-19 virus infection 04/05/2021   TIA (transient ischemic attack) 04/05/2021   CAD (coronary artery disease) 10/10/2017   Ischemic cardiomyopathy    Chronic combined systolic and diastolic heart failure (HCC)    Hyperlipidemia LDL goal <70    Acute ST elevation myocardial infarction (STEMI) involving left anterior descending (LAD) coronary artery ([place]) 10/07/2017   Collagenous colitis    History of right below knee amputation ([place]) 10/13/2013   Sinus tachycardia 06/04/2012   Anxiety state, unspecified 06/04/2012    Past Surgical History:  Procedure Laterality Date   BACK SURGERY     CARDIAC CATHETERIZATION     CORONARY STENT INTERVENTION N/A 10/07/2017   Procedure: CORONARY STENT INTERVENTION;  Surgeon: Wellington Hampshire, MD;  Location: [facility];  Service: Cardiovascular;  Laterality: N/A;   CORONARY/GRAFT ACUTE MI REVASCULARIZATION N/A 10/07/2017   Procedure: Coronary/Graft Acute MI Revascularization;  Surgeon: Wellington Hampshire, MD;  Location: [facility];  Service: Cardiovascular;  Laterality: N/A;   LEFT HEART CATH AND CORONARY ANGIOGRAPHY N/A 10/07/2017   Procedure: LEFT HEART CATH AND CORONARY ANGIOGRAPHY;  Surgeon: Wellington Hampshire, MD;  Location: [place] [facility];  Service:  Cardiovascular;  Laterality: N/A;   LEG AMPUTATION     Right Below knee   STOMACH SURGERY     [place]roth II gastrojejunostomy   TOTAL KNEE ARTHROPLASTY         Home Medications    Prior to Admission medications   Medication Sig Start Date End Date Taking? Authorizing Provider  ALPRAZolam Duanne Moron) 0.5 MG tablet Take 1 tablet (0.5 mg total) by mouth at bedtime as needed. for sleep 04/11/22   Susy Frizzle, MD  aspirin 81 MG tablet Take 81 mg by mouth daily.    [provider]  budesonide  (ENTOCORT EC) 3 MG 24 hr capsule Take 2 capsules (6 mg total) by mouth daily. 08/15/21   Susy Frizzle, MD  carvedilol (COREG) 3.125 MG tablet TAKE 1 TABLET BY MOUTH TWICE A DAY WITH FOOD 05/01/22   Burnell Blanks, MD  cholestyramine Lucrezia Starch) 4 GM/DOSE powder Take 1 packet (4 g total) by mouth daily as needed (for colitis flares (mix and drink)). 04/11/22   Susy Frizzle, MD  fluorouracil (EFUDEX) 5 % cream Apply 1 application topically daily as needed (as directed- affected sites). 07/19/20   [provider]  gabapentin (NEURONTIN) 100 MG capsule TAKE 1 CAPSULE (100 MG TOTAL) BY MOUTH 3 (THREE) TIMES DAILY AS NEEDED (NERVE PAIN). 05/01/22   Susy Frizzle, MD  losartan (COZAAR) 25 MG tablet Take 1 tablet (25 mg total) by mouth daily. 01/30/22   Burnell Blanks, MD  mometasone (ELOCON) 0.1 % cream Apply topically daily. 04/23/21   Susy Frizzle, MD  Multiple Vitamins-Minerals (PRESERVISION AREDS PO) Take 1 capsule by mouth 2 (two) times daily.    [provider]  nitroGLYCERIN (NITROSTAT) 0.4 MG SL tablet PLACE 1 TABLET (0.4 MG TOTAL) UNDER THE TONGUE EVERY 5 MINUTES X 3 DOSES AS NEEDED FOR CHEST PAIN. 05/28/21   Susy Frizzle, MD  Omega-3 Fatty Acids ([employer] FISH OIL) 1200 MG CAPS Take 1,200 mg by mouth daily with breakfast.    [provider]  rosuvastatin (CRESTOR) 20 MG tablet Take 1 tablet (20 mg total) by mouth daily. 05/01/22   Susy Frizzle, MD  sulfamethoxazole-trimethoprim (BACTRIM DS) 800-160 MG tablet Take 1 tablet by mouth 2 (two) times daily. 08/02/21   Susy Frizzle, MD  triamcinolone cream (KENALOG) 0.1 % APPLY 1 APPLICATION. TOPICALLY 2 (TWO) TIMES DAILY. 06/07/21   Susy Frizzle, MD    Family History Family History  Problem Relation Age of Onset   Diabetes Sister    Diabetes Brother    Diabetes Sister    Cancer Sister        breast    Social History Social History   Tobacco Use   Smoking status: Former     Packs/day: 1.00    Years: 8.00    Total pack years: 8.00    Types: Cigarettes   Smokeless tobacco: Never  Vaping Use   Vaping Use: Never used  Substance Use Topics   Alcohol use: Yes    Comment: occasional   Drug use: No     Allergies   Aleve [naproxen sodium], Amoxicillin, Gabapentin, Morphine and related, and Penicillins   Review of Systems Review of Systems Per HPI  Physical Exam Triage Vital Signs ED Triage Vitals  Enc Vitals Group     BP 05/08/22 1209 (!) 170/80     Pulse Rate 05/08/22 1209 72     Resp 05/08/22 1209 18     Temp  05/08/22 1209 97.9 F (36.6 C)     Temp Source 05/08/22 1209 Oral     SpO2 05/08/22 1209 96 %     Weight --      Height --      Head Circumference --      Peak Flow --      Pain Score 05/08/22 1211 6     Pain Loc --      Pain Edu? --      Excl. in [place]? --    No data found.  Updated Vital Signs BP (!) 170/80 (BP Location: Left Arm)   Pulse 72   Temp 97.9 F (36.6 C) (Oral)   Resp 18   SpO2 96%   Visual Acuity Right Eye Distance:   Left Eye Distance:   Bilateral Distance:    Right Eye Near:   Left Eye Near:    Bilateral Near:     Physical Exam Vitals and nursing note reviewed.  Constitutional:      General: He is not in acute distress.    Appearance: Normal appearance.  HENT:     Head: Normocephalic.  Eyes:     Extraocular Movements: Extraocular movements intact.     Pupils: Pupils are equal, round, and reactive to light.  Cardiovascular:     Rate and Rhythm: Normal rate and regular rhythm.     Pulses: Normal pulses.     Heart sounds: Normal heart sounds.  Pulmonary:     Effort: Pulmonary effort is normal.     Breath sounds: Normal breath sounds.  Abdominal:     General: Bowel sounds are normal. There is no distension.     Palpations: Abdomen is soft. There is no mass.     Tenderness: There is no abdominal tenderness. There is no right CVA tenderness, left CVA tenderness, guarding or rebound.     Hernia: No  hernia is present.  Musculoskeletal:     Cervical back: Normal range of motion.  Skin:    General: Skin is warm and dry.     Comments: Skin tear noted to the posterior- lateral aspect of the right upper extremity.  Skin tear noted the above the right elbow extending down to the patient's elbow.  Patient presents with adhered gauze to the site.  Gauze is "stuck" to the wound.  Moderate bruising and ecchymosis present.  Bleeding is controlled at this time.  Neurological:     General: No focal deficit present.     Mental Status: He is alert and oriented to person, place, and time.  Psychiatric:        Mood and Affect: Mood normal.        Behavior: Behavior normal.           UC Treatments / Results  Labs (all labs ordered are listed, but only abnormal results are displayed) Labs Reviewed - No data to display  EKG: Sinus bradycardia, no STEMI   Radiology No results found.  Procedures Procedures (including critical care time)  Medications Ordered in UC Medications - No data to display  Initial Impression / Assessment and Plan / UC Course  I have reviewed the triage vital signs and the nursing notes.  Pertinent labs & imaging results that were available during my care of the patient were reviewed by me and considered in my medical decision making (see chart for details).  Patient presents status post syncopal episode and fall that occurred 1 day ago.  EKG without acute STEMI.  Patient with wound to the lateral aspect of the right upper arm.  Wound was dressed by local first responders with gauze.  Gauze is "adhered" to the wound.  Multiple attempts to remove the dressing from the wound to include normal saline, and Vaseline gauze based on the available supplies present in this clinic.  Able to lift the edges of the gauze from the wound, but majority of the gauze remains "closely adhered" to the wound.  Due to the nature of the patient's wound to his arm, and syncopal episode,  patient's daughters were advised it would be best the patient was followed up on in the emergency department for further evaluation.  The patient and his daughters were in agreement with this recommendation.  Patient's vital signs are stable, he is able to travel via private vehicle.   Final Clinical Impressions(s) / UC Diagnoses   Final diagnoses:  Syncope, unspecified syncope type  Skin tear of right upper extremity   Discharge Instructions   None    ED Prescriptions   None    PDMP not reviewed this encounter.   Tish Men, NP 05/08/22 1420    Canna Nickelson-Warren, Alda Lea, NP 05/08/22 1645

## 2022-05-08 NOTE — ED Notes (Signed)
Patient is being discharged from the Urgent Care and sent to the Emergency Department via private vehicle . Per NP, patient is in need of higher level of care due to syncopal episode and guaze adhered to elbow. Patient is aware and verbalizes understanding of plan of care.  Vitals:   05/08/22 1209  BP: (!) 170/80  Pulse: 72  Resp: 18  Temp: 97.9 F (36.6 C)  SpO2: 96%

## 2022-05-08 NOTE — ED Triage Notes (Signed)
Pt reports he passed out yesterday and has a wound on his right arm around the elbow.  Pt reports EMS placed a bandage on the wound yesterday and they can not get it off.  Reports they just came from urgent care and they could not get it off either.

## 2022-05-09 ENCOUNTER — Encounter: Payer: Self-pay | Admitting: Family Medicine

## 2022-05-09 ENCOUNTER — Ambulatory Visit (INDEPENDENT_AMBULATORY_CARE_PROVIDER_SITE_OTHER): Payer: PPO | Admitting: Family Medicine

## 2022-05-09 ENCOUNTER — Telehealth: Payer: Self-pay

## 2022-05-09 VITALS — BP 120/62 | HR 75 | Temp 97.5°F | Ht 70.0 in | Wt 140.0 lb

## 2022-05-09 DIAGNOSIS — R55 Syncope and collapse: Secondary | ICD-10-CM | POA: Diagnosis not present

## 2022-05-09 NOTE — Telephone Encounter (Signed)
Per pt's wife, pt is can't take Gapentin, it cause pt to pass out.   Med is currently on med list. Plse advice?

## 2022-05-09 NOTE — Progress Notes (Unsigned)
Acute Office Visit  Subjective:     Patient ID: Eugene [name], male    DOB: [birth year], 89 y.o.   MRN: [id number]  Chief Complaint  Patient presents with   Follow-up    ED f/u patient fainted 2/21; has bad bruises and blood clots - JBG\\\    HPI Patient is in today for follow up for right arm wound. He fainted on 2/21 at home, fell from sitting, says he "maybe" felt lightheaded before but does not remember much about what could have caused the fall. He is with his daughter today. He denies hitting his head. Was evaluated in urgent care the following day and sent to the ED for wound evaluation that same day. EKG was done, unable to view results. His right arm wound was dressed and he was sent home.  Review of Systems  Constitutional: Negative.   HENT: Negative.    Eyes: Negative.   Cardiovascular: Negative.   Neurological: Negative.   Psychiatric/Behavioral: Negative.    All other systems reviewed and are negative.       Objective:    BP 120/62   Pulse 75   Temp (!) 97.5 F (36.4 C) (Oral)   Ht '5\' 10"'$  (1.778 m)   Wt 140 lb (63.5 kg)   SpO2 97%   BMI 20.09 kg/m    Physical Exam Vitals and nursing note reviewed.  Constitutional:      Appearance: Normal appearance. He is normal weight.  HENT:     Head: Normocephalic and atraumatic.  Cardiovascular:     Rate and Rhythm: Normal rate and regular rhythm.     Pulses: Normal pulses.     Heart sounds: Normal heart sounds.  Pulmonary:     Effort: Pulmonary effort is normal.     Breath sounds: Normal breath sounds.  Skin:    General: Skin is warm and dry.     Capillary Refill: Capillary refill takes less than 2 seconds.     Findings: Signs of injury present.     Comments: Large skin tear to right forearm  Neurological:     General: No focal deficit present.     Mental Status: He is alert and oriented to person, place, and time. Mental status is at baseline.  Psychiatric:        Mood and Affect: Mood normal.         Behavior: Behavior normal.        Thought Content: Thought content normal.        Judgment: Judgment normal.     Results for orders placed or performed in visit on 05/09/22  CBC with Differential/Platelet  Result Value Ref Range   WBC 12.8 (H) 3.8 - 10.8 Thousand/uL   RBC 4.47 4.20 - 5.80 Million/uL   Hemoglobin 14.4 13.2 - 17.1 g/dL   HCT 43.1 38.5 - 50.0 %   MCV 96.4 80.0 - 100.0 fL   MCH 32.2 27.0 - 33.0 pg   MCHC 33.4 32.0 - 36.0 g/dL   RDW 11.5 11.0 - 15.0 %   Platelets 259 140 - 400 Thousand/uL   MPV 9.6 7.5 - 12.5 fL   Neutro Abs 10,432 (H) 1,500 - 7,800 cells/uL   Lymphs Abs 1,254 850 - 3,900 cells/uL   Absolute Monocytes 998 (H) 200 - 950 cells/uL   Eosinophils Absolute 90 15 - 500 cells/uL   Basophils Absolute 26 0 - 200 cells/uL   Neutrophils Relative % 81.5 %   Total Lymphocyte 9.8 %  Monocytes Relative 7.8 %   Eosinophils Relative 0.7 %   Basophils Relative 0.2 %  COMPLETE METABOLIC PANEL WITH GFR  Result Value Ref Range   Glucose, Bld 133 (H) 65 - 99 mg/dL   BUN 21 7 - 25 mg/dL   Creat 0.91 0.70 - 1.22 mg/dL   eGFR 81 > OR = 60 mL/min/1.62m   BUN/Creatinine Ratio SEE NOTE: 6 - 22 (calc)   Sodium 138 135 - 146 mmol/L   Potassium 4.6 3.5 - 5.3 mmol/L   Chloride 104 98 - 110 mmol/L   CO2 25 20 - 32 mmol/L   Calcium 8.6 8.6 - 10.3 mg/dL   Total Protein 5.4 (L) 6.1 - 8.1 g/dL   Albumin 3.4 (L) 3.6 - 5.1 g/dL   Globulin 2.0 1.9 - 3.7 g/dL (calc)   AG Ratio 1.7 1.0 - 2.5 (calc)   Total Bilirubin 0.7 0.2 - 1.2 mg/dL   Alkaline phosphatase (APISO) 84 35 - 144 U/L   AST 25 10 - 35 U/L   ALT 43 9 - 46 U/L        Assessment & Plan:   Problem List Items Addressed This Visit       Other   Syncope - Primary    Patient is here with his daughter today, she denies AMS, confusion, or any changes in the patient since the fall. He presents with a skin tear to right upper arm, it was cleaned with saline and redressed with nonadherent dressing. Discussed signs and  symptoms of infection and when to call office. Dressing changes daily and PRN. Discussed s/s to monitor closely for such as AMS, confusion, gait difficulties, difficulty speaking but no concerns he may have hit his head at this time. Will obtain CBC and CMP.      Relevant Orders   CBC with Differential/Platelet (Completed)   COMPLETE METABOLIC PANEL WITH GFR (Completed)    No orders of the defined types were placed in this encounter.   Return if symptoms worsen or fail to improve.  ARubie Maid FNP

## 2022-05-10 LAB — CBC WITH DIFFERENTIAL/PLATELET
Absolute Monocytes: 998 cells/uL — ABNORMAL HIGH (ref 200–950)
Basophils Absolute: 26 cells/uL (ref 0–200)
Basophils Relative: 0.2 %
Eosinophils Absolute: 90 cells/uL (ref 15–500)
Eosinophils Relative: 0.7 %
HCT: 43.1 % (ref 38.5–50.0)
Hemoglobin: 14.4 g/dL (ref 13.2–17.1)
Lymphs Abs: 1254 cells/uL (ref 850–3900)
MCH: 32.2 pg (ref 27.0–33.0)
MCHC: 33.4 g/dL (ref 32.0–36.0)
MCV: 96.4 fL (ref 80.0–100.0)
MPV: 9.6 fL (ref 7.5–12.5)
Monocytes Relative: 7.8 %
Neutro Abs: 10432 cells/uL — ABNORMAL HIGH (ref 1500–7800)
Neutrophils Relative %: 81.5 %
Platelets: 259 10*3/uL (ref 140–400)
RBC: 4.47 10*6/uL (ref 4.20–5.80)
RDW: 11.5 % (ref 11.0–15.0)
Total Lymphocyte: 9.8 %
WBC: 12.8 10*3/uL — ABNORMAL HIGH (ref 3.8–10.8)

## 2022-05-10 LAB — COMPLETE METABOLIC PANEL WITH GFR
AG Ratio: 1.7 (calc) (ref 1.0–2.5)
ALT: 43 U/L (ref 9–46)
AST: 25 U/L (ref 10–35)
Albumin: 3.4 g/dL — ABNORMAL LOW (ref 3.6–5.1)
Alkaline phosphatase (APISO): 84 U/L (ref 35–144)
BUN: 21 mg/dL (ref 7–25)
CO2: 25 mmol/L (ref 20–32)
Calcium: 8.6 mg/dL (ref 8.6–10.3)
Chloride: 104 mmol/L (ref 98–110)
Creat: 0.91 mg/dL (ref 0.70–1.22)
Globulin: 2 g/dL (calc) (ref 1.9–3.7)
Glucose, Bld: 133 mg/dL — ABNORMAL HIGH (ref 65–99)
Potassium: 4.6 mmol/L (ref 3.5–5.3)
Sodium: 138 mmol/L (ref 135–146)
Total Bilirubin: 0.7 mg/dL (ref 0.2–1.2)
Total Protein: 5.4 g/dL — ABNORMAL LOW (ref 6.1–8.1)
eGFR: 81 mL/min/{1.73_m2} (ref >=60)

## 2022-05-11 NOTE — ED Provider Notes (Signed)
[facility] Provider Note   CSN: [id number] Arrival date & time: 05/08/22  1400     History  Chief Complaint  Patient presents with   Wound Check    Eugene [name] is a 89 y.o. male with a history including CAD, CHF, history of MI, hyperlipidemia and TIA presenting for further treatment of the wound he sustained on his right arm yesterday.  He reports he passed out in his kitchen and fell and sustained bruising predominantly on his arms with a large skin tear of his right lateral arm just above his elbow.  He was seen by EMS who dressed his wound but he felt well and did not want to be transported.  Today he went to urgent care center for a wound check as the EMS folks used cotton 4 x 4's on the skin tear and the pad is adherent to the wound.  The wound was soaked at the urgent care center using saline, but was still unable to remove the dressing, therefore he was advised to come here for further management of his wound.  Also given his history of syncope, he would benefit from further evaluation of this event.  However, patient states he is just here for assistance with his wound and is not interested in blood work or other tests from this episode of syncope.  He had a stable EKG at urgent care prior to coming here.  He states he has follow-up care with his PCP tomorrow.  The history is provided by the patient.       Home Medications Prior to Admission medications   Medication Sig Start Date End Date Taking? Authorizing Provider  ALPRAZolam Duanne Moron) 0.5 MG tablet Take 1 tablet (0.5 mg total) by mouth at bedtime as needed. for sleep 04/11/22   Susy Frizzle, MD  aspirin 81 MG tablet Take 81 mg by mouth daily.    [provider]  budesonide (ENTOCORT EC) 3 MG 24 hr capsule Take 2 capsules (6 mg total) by mouth daily. 08/15/21   Susy Frizzle, MD  carvedilol (COREG) 3.125 MG tablet TAKE 1 TABLET BY MOUTH TWICE A DAY WITH FOOD  05/01/22   Burnell Blanks, MD  cholestyramine Lucrezia Starch) 4 GM/DOSE powder Take 1 packet (4 g total) by mouth daily as needed (for colitis flares (mix and drink)). 04/11/22   Susy Frizzle, MD  fluorouracil (EFUDEX) 5 % cream Apply 1 application topically daily as needed (as directed- affected sites). 07/19/20   [provider]  losartan (COZAAR) 25 MG tablet Take 1 tablet (25 mg total) by mouth daily. 01/30/22   Burnell Blanks, MD  mometasone (ELOCON) 0.1 % cream Apply topically daily. 04/23/21   Susy Frizzle, MD  Multiple Vitamins-Minerals (PRESERVISION AREDS PO) Take 1 capsule by mouth 2 (two) times daily.    [provider]  nitroGLYCERIN (NITROSTAT) 0.4 MG SL tablet PLACE 1 TABLET (0.4 MG TOTAL) UNDER THE TONGUE EVERY 5 MINUTES X 3 DOSES AS NEEDED FOR CHEST PAIN. 05/28/21   Susy Frizzle, MD  Omega-3 Fatty Acids ([employer] FISH OIL) 1200 MG CAPS Take 1,200 mg by mouth daily with breakfast.    [provider]  rosuvastatin (CRESTOR) 20 MG tablet Take 1 tablet (20 mg total) by mouth daily. 05/01/22   Susy Frizzle, MD  sulfamethoxazole-trimethoprim (BACTRIM DS) 800-160 MG tablet Take 1 tablet by mouth 2 (two) times daily. 08/02/21   Susy Frizzle, MD  triamcinolone cream (KENALOG) 0.1 % APPLY 1 APPLICATION. TOPICALLY 2 (TWO) TIMES DAILY. 06/07/21   Susy Frizzle, MD      Allergies    Aleve [naproxen sodium], Amoxicillin, Gabapentin, Morphine and related, and Penicillins    Review of Systems   Review of Systems  Constitutional:  Negative for chills and fever.  HENT:  Negative for congestion and sore throat.   Eyes: Negative.   Respiratory:  Negative for chest tightness and shortness of breath.   Cardiovascular:  Negative for chest pain.  Gastrointestinal:  Negative for abdominal pain and nausea.  Genitourinary: Negative.   Musculoskeletal:  Negative for arthralgias, joint swelling and neck pain.  Skin:  Positive for wound. Negative for  rash.  Neurological:  Positive for syncope. Negative for dizziness, weakness, light-headedness, numbness and headaches.  Psychiatric/Behavioral: Negative.    All other systems reviewed and are negative.   Physical Exam Updated Vital Signs BP (!) 162/77   Pulse 66   Temp 98 F (36.7 C) (Oral)   Resp 14   Ht '5\' 10"'$  (1.778 m)   Wt 66.2 kg   SpO2 97%   BMI 20.95 kg/m  Physical Exam Vitals and nursing note reviewed.  Constitutional:      Appearance: He is well-developed.  HENT:     Head: Normocephalic and atraumatic.  Eyes:     Conjunctiva/sclera: Conjunctivae normal.  Cardiovascular:     Rate and Rhythm: Normal rate and regular rhythm.     Heart sounds: Normal heart sounds.  Pulmonary:     Effort: Pulmonary effort is normal.     Breath sounds: Normal breath sounds. No wheezing.  Abdominal:     General: Bowel sounds are normal.     Palpations: Abdomen is soft.     Tenderness: There is no abdominal tenderness.  Musculoskeletal:        General: Normal range of motion.  Skin:    General: Skin is warm and dry.     Findings: Bruising present.     Comments: Large superficial skin tear right arm just above the elbow.  Please refer to the photos from the urgent care visit.  Neurological:     Mental Status: He is alert.     ED Results / Procedures / Treatments   Labs (all labs ordered are listed, but only abnormal results are displayed) Labs Reviewed - No data to display  EKG None  Radiology No results found.  Procedures Procedures    Patient is adherent 4 x 4 dressing was gently removed after copious use of hydrogen peroxide the dressing was slowly peeled off of the wound.  Patient tolerated the procedure well.  A new dressing was applied with the first layer being in several layers of Xeroform dressing followed by 4 x 4's and then [place].  Medications Ordered in ED Medications  hydrogen peroxide 3 % external solution (  Given by Other 05/08/22 1535)  Tdap  (BOOSTRIX) injection 0.5 mL (0.5 mLs Intramuscular Given 05/08/22 1604)    ED Course/ Medical Decision Making/ A&P                             Medical Decision Making Patient presenting for wound care secondary to a skin tear from a syncopal event which occurred yesterday.  He is not interested in workup for the syncope, he states he feels fine and he is scheduled to see his doctor tomorrow.  His wound was cared  for, the adherent 4 x 4 was carefully removed, new dressing was applied using Xeroform as the first layer.  Plan follow-up with his primary provider tomorrow as he has scheduled.  Risk Prescription drug management.           Final Clinical Impression(s) / ED Diagnoses Final diagnoses:  Visit for wound check  Syncope, unspecified syncope type    Rx / DC Orders ED Discharge Orders     None         Landis Martins 05/11/22 2128    Milton Ferguson, MD 05/13/22 1424

## 2022-05-12 NOTE — Assessment & Plan Note (Signed)
Patient is here with his daughter today, she denies AMS, confusion, or any changes in the patient since the fall. He presents with a skin tear to right upper arm, it was cleaned with saline and redressed with nonadherent dressing. Discussed signs and symptoms of infection and when to call office. Dressing changes daily and PRN. Discussed s/s to monitor closely for such as AMS, confusion, gait difficulties, difficulty speaking but no concerns he may have hit his head at this time. Will obtain CBC and CMP.

## 2022-05-15 ENCOUNTER — Encounter: Payer: Self-pay | Admitting: Family Medicine

## 2022-05-22 ENCOUNTER — Other Ambulatory Visit: Payer: Self-pay | Admitting: Family Medicine

## 2022-06-05 ENCOUNTER — Encounter: Payer: Self-pay | Admitting: Family Medicine

## 2022-06-05 ENCOUNTER — Ambulatory Visit (INDEPENDENT_AMBULATORY_CARE_PROVIDER_SITE_OTHER): Payer: PPO | Admitting: Family Medicine

## 2022-06-05 VITALS — BP 120/60 | HR 74 | Temp 98.4°F | Ht 70.0 in | Wt 142.6 lb

## 2022-06-05 DIAGNOSIS — M461 Sacroiliitis, not elsewhere classified: Secondary | ICD-10-CM

## 2022-06-05 MED ORDER — PREDNISONE 20 MG PO TABS: ORAL_TABLET | ORAL | 0 refills | Status: DC

## 2022-06-05 NOTE — Progress Notes (Signed)
Subjective:    Patient ID: SHA PASS, male    DOB: 30-Jun-1931, 89 y.o.   MRN: [id number]  Patient has a history of a right BKA.  Patient reports posterior hip pain bilaterally.  The pain is over the SI joint.  He states that in the mornings when he gets out of bed he has an aching pain in that area.  It does not radiate.  It hurts to stand.  He denies any numbness or tingling radiating down his leg.  He denies any saddle anesthesia or bowel or bladder incontinence.  He denies any leg weakness.  He denies any dysuria or urgency or frequency.  He denies any fevers or chills.  He denies any rash.  He has full range of motion in both hips.  Flexion, extension, external and internal rotation of either hip does not elicit any pain  Past Medical History:  Diagnosis Date   Amputated right leg (HCC)    CAD (coronary artery disease)    a. 09/2017 - acute MI -  emergent cath showing 99% stenosis in midLAD with thrombotic stenosis at the bifurcation of the second diagonal, which was relatively small in diameter. He underwent placement of DES to midLAD which jailed the second diagonal.    Chronic combined systolic and diastolic heart failure ([place])    Collagenous colitis    Dr. Festus Holts   History of ST elevation myocardial infarction (STEMI)    09/2017: LAD   History of stomach ulcers    Hyperlipidemia LDL goal <70    Ischemic cardiomyopathy    Squamous cell carcinoma of skin 03/27/2014   in situ-left temple (txpbx)   Squamous cell carcinoma of skin 02/24/2018   in situ-left cheek (CX35FU)   Past Surgical History:  Procedure Laterality Date   BACK SURGERY     CARDIAC CATHETERIZATION     CORONARY STENT INTERVENTION N/A 10/07/2017   Procedure: CORONARY STENT INTERVENTION;  Surgeon: Wellington Hampshire, MD;  Location: [facility];  Service: Cardiovascular;  Laterality: N/A;   CORONARY/GRAFT ACUTE MI REVASCULARIZATION N/A 10/07/2017   Procedure: Coronary/Graft Acute MI Revascularization;   Surgeon: Wellington Hampshire, MD;  Location: [facility];  Service: Cardiovascular;  Laterality: N/A;   LEFT HEART CATH AND CORONARY ANGIOGRAPHY N/A 10/07/2017   Procedure: LEFT HEART CATH AND CORONARY ANGIOGRAPHY;  Surgeon: Wellington Hampshire, MD;  Location: [facility];  Service: Cardiovascular;  Laterality: N/A;   LEG AMPUTATION     Right Below knee   STOMACH SURGERY     Billroth II gastrojejunostomy   TOTAL KNEE ARTHROPLASTY     Current Outpatient Medications on File Prior to Visit  Medication Sig Dispense Refill   ALPRAZolam (XANAX) 0.5 MG tablet Take 1 tablet (0.5 mg total) by mouth at bedtime as needed. for sleep 30 tablet 3   aspirin 81 MG tablet Take 81 mg by mouth daily.     budesonide (ENTOCORT EC) 3 MG 24 hr capsule TAKE 2 CAPSULES (6 MG TOTAL) BY MOUTH DAILY. 180 capsule 3   carvedilol (COREG) 3.125 MG tablet TAKE 1 TABLET BY MOUTH TWICE A DAY WITH FOOD 180 tablet 3   cholestyramine (QUESTRAN) 4 GM/DOSE powder Take 1 packet (4 g total) by mouth daily as needed (for colitis flares (mix and drink)). 348.6 g 1   fluorouracil (EFUDEX) 5 % cream Apply 1 application topically daily as needed (as directed- affected sites).     losartan (COZAAR) 25 MG tablet Take  1 tablet (25 mg total) by mouth daily. 90 tablet 3   mometasone (ELOCON) 0.1 % cream Apply topically daily. 45 g 1   Multiple Vitamins-Minerals (PRESERVISION AREDS PO) Take 1 capsule by mouth 2 (two) times daily.     nitroGLYCERIN (NITROSTAT) 0.4 MG SL tablet PLACE 1 TABLET (0.4 MG TOTAL) UNDER THE TONGUE EVERY 5 MINUTES X 3 DOSES AS NEEDED FOR CHEST PAIN. 25 tablet 3   Omega-3 Fatty Acids ([employer] FISH OIL) 1200 MG CAPS Take 1,200 mg by mouth daily with breakfast.     rosuvastatin (CRESTOR) 20 MG tablet Take 1 tablet (20 mg total) by mouth daily. 90 tablet 3   sulfamethoxazole-trimethoprim (BACTRIM DS) 800-160 MG tablet Take 1 tablet by mouth 2 (two) times daily. 14 tablet 0   triamcinolone cream (KENALOG) 0.1 % APPLY 1  APPLICATION. TOPICALLY 2 (TWO) TIMES DAILY. 60 g 0   No current facility-administered medications on file prior to visit.   Allergies  Allergen Reactions   Aleve [Naproxen Sodium] Nausea And Vomiting and Other (See Comments)    No problem with other NSAIDs   Amoxicillin Other (See Comments)    Vertigo Did it involve swelling of the face/tongue/throat, SOB, or low BP? No Did it involve sudden or severe rash/hives, skin peeling, or any reaction on the inside of your mouth or nose? No Did you need to seek medical attention at a hospital or doctor's office? unknown When did it last happen?   yrs ago    dizziness If all above answers are "NO", may proceed with cephalosporin use.    Gabapentin Other (See Comments)    Might be sensitive to this- took 100 mg twice within a couple of hours = Ended up falling from a standing position and had to be taken to the E.D.   Morphine And Related Other (See Comments)    Dizziness    Penicillins Diarrhea and Other (See Comments)    Diarrhea Did it involve swelling of the face/tongue/throat, SOB, or low BP? No Did it involve sudden or severe rash/hives, skin peeling, or any reaction on the inside of your mouth or nose? No Did you need to seek medical attention at a hospital or doctor's office? Unknown When did it last happen?    years ago   If all above answers are "NO", may proceed with cephalosporin use.    Social History   Socioeconomic History   Marital status: Married    Spouse name: Financial risk analyst   Number of children: Not on file   Years of education: 12   Highest education level: High school graduate  Occupational History   Occupation: Retired  Tobacco Use   Smoking status: Former    Packs/day: 1.00    Years: 8.00    Additional pack years: 0.00    Total pack years: 8.00    Types: Cigarettes   Smokeless tobacco: Never  Vaping Use   Vaping Use: Never used  Substance and Sexual Activity   Alcohol use: Yes    Comment: occasional   Drug  use: No   Sexual activity: Yes    Comment: married  Other Topics Concern   Not on file  Social History Narrative   Not on file   Social Determinants of Health   Financial Resource Strain: Low Risk  (09/05/2021)   Overall Financial Resource Strain (CARDIA)    Difficulty of Paying Living Expenses: Not hard at all  Food Insecurity: No Food Insecurity (09/05/2021)   Hunger Vital Sign  Worried About Charity fundraiser in the Last Year: Never true    [place] in the Last Year: Never true  Transportation Needs: No Transportation Needs (09/05/2021)   PRAPARE - Hydrologist (Medical): No    Lack of Transportation (Non-Medical): No  Physical Activity: Sufficiently Active (09/05/2021)   Exercise Vital Sign    Days of Exercise per Week: 5 days    Minutes of Exercise per Session: 30 min  Stress: No Stress Concern Present (09/05/2021)   [place]    Feeling of Stress : Not at all  Social Connections: [place] (09/05/2021)   Social Connection and Isolation Panel [NHANES]    Frequency of Communication with Friends and Family: More than three times a week    Frequency of Social Gatherings with Friends and Family: More than three times a week    Attends Religious Services: More than 4 times per year    Active Member of [employer] or Organizations: Yes    Attends Archivist Meetings: More than 4 times per year    Marital Status: Married  Human resources officer Violence: Not At Risk (09/05/2021)   Humiliation, Afraid, Rape, and Kick questionnaire    Fear of Current or Ex-Partner: No    Emotionally Abused: No    Physically Abused: No    Sexually Abused: No   Review of Systems  All other systems reviewed and are negative.      Objective:   Physical Exam Vitals reviewed.  Cardiovascular:     Rate and Rhythm: Normal rate and regular rhythm.     Heart sounds: Normal heart sounds.   Pulmonary:     Effort: Pulmonary effort is normal. No respiratory distress.     Breath sounds: Normal breath sounds. No wheezing or rales.  Musculoskeletal:     Lumbar back: No spasms, tenderness or bony tenderness. Normal range of motion. Negative right straight leg raise test and negative left straight leg raise test.       Back:     Right hip: No tenderness or bony tenderness. Normal range of motion.     Left hip: No tenderness or bony tenderness. Normal range of motion.        Assessment & Plan:  Sacroiliitis ([place]) I believe the patient is dealing with sacroiliitis.  Begin prednisone taper pack as he is unable to take NSAIDs.  Temporarily discontinue budesonide.  Patient is not taking budesonide anyway.  Reassess in 1 week and proceed with x-rays if not improving.  Recheck immediately if he develops any dysuria urgency frequency or hematuria

## 2022-06-16 ENCOUNTER — Encounter: Payer: Self-pay | Admitting: Family Medicine

## 2022-06-16 ENCOUNTER — Ambulatory Visit (INDEPENDENT_AMBULATORY_CARE_PROVIDER_SITE_OTHER): Payer: PPO | Admitting: Family Medicine

## 2022-06-16 VITALS — BP 120/68 | HR 76 | Temp 97.8°F | Ht 70.0 in | Wt 141.2 lb

## 2022-06-16 DIAGNOSIS — M461 Sacroiliitis, not elsewhere classified: Secondary | ICD-10-CM

## 2022-06-16 NOTE — Progress Notes (Signed)
Subjective:    Patient ID: Eugene [name], male    DOB: [birth year], 89 y.o.   MRN: [id number] 06/05/22 Patient has a history of a right BKA.  Patient reports posterior hip pain bilaterally.  The pain is over the SI joint.  He states that in the mornings when he gets out of bed he has an aching pain in that area.  It does not radiate.  It hurts to stand.  He denies any numbness or tingling radiating down his leg.  He denies any saddle anesthesia or bowel or bladder incontinence.  He denies any leg weakness.  He denies any dysuria or urgency or frequency.  He denies any fevers or chills.  He denies any rash.  He has full range of motion in both hips.  Flexion, extension, external and internal rotation of either hip does not elicit any pain.  At that time, my plan was: I believe the patient is dealing with sacroiliitis.  Begin prednisone taper pack as he is unable to take NSAIDs.  Temporarily discontinue budesonide.  Patient is not taking budesonide anyway.  Reassess in 1 week and proceed with x-rays if not improving.  Recheck immediately if he develops any dysuria urgency frequency or hematuria  06/16/22 Patient continues to deal with pain in both SI joints bilaterally.  They ache and throb.  The prednisone gave him no relief.  He is unable to take NSAIDs due to his cardiovascular history no pain with flexion or extension of his hip joints.  There is no pain in his lower back.  He denies any symptoms of sciatica.  Past Medical History:  Diagnosis Date   Amputated right leg    CAD (coronary artery disease)    a. 09/2017 - acute MI -  emergent cath showing 99% stenosis in midLAD with thrombotic stenosis at the bifurcation of the second diagonal, which was relatively small in diameter. He underwent placement of DES to midLAD which jailed the second diagonal.    Chronic combined systolic and diastolic heart failure    Collagenous colitis    Dr. Festus Holts   History of ST elevation myocardial infarction  (STEMI)    09/2017: LAD   History of stomach ulcers    Hyperlipidemia LDL goal <70    Ischemic cardiomyopathy    Squamous cell carcinoma of skin 03/27/2014   in situ-left temple (txpbx)   Squamous cell carcinoma of skin 02/24/2018   in situ-left cheek (CX35FU)   Past Surgical History:  Procedure Laterality Date   BACK SURGERY     CARDIAC CATHETERIZATION     CORONARY STENT INTERVENTION N/A 10/07/2017   Procedure: CORONARY STENT INTERVENTION;  Surgeon: Wellington Hampshire, MD;  Location: [facility];  Service: Cardiovascular;  Laterality: N/A;   CORONARY/GRAFT ACUTE MI REVASCULARIZATION N/A 10/07/2017   Procedure: Coronary/Graft Acute MI Revascularization;  Surgeon: Wellington Hampshire, MD;  Location: [facility];  Service: Cardiovascular;  Laterality: N/A;   LEFT HEART CATH AND CORONARY ANGIOGRAPHY N/A 10/07/2017   Procedure: LEFT HEART CATH AND CORONARY ANGIOGRAPHY;  Surgeon: Wellington Hampshire, MD;  Location: [facility];  Service: Cardiovascular;  Laterality: N/A;   LEG AMPUTATION     Right Below knee   STOMACH SURGERY     Billroth II gastrojejunostomy   TOTAL KNEE ARTHROPLASTY     Current Outpatient Medications on File Prior to Visit  Medication Sig Dispense Refill   ALPRAZolam (XANAX) 0.5 MG tablet Take 1 tablet (0.5 mg  total) by mouth at bedtime as needed. for sleep 30 tablet 3   aspirin 81 MG tablet Take 81 mg by mouth daily.     budesonide (ENTOCORT EC) 3 MG 24 hr capsule TAKE 2 CAPSULES (6 MG TOTAL) BY MOUTH DAILY. 180 capsule 3   carvedilol (COREG) 3.125 MG tablet TAKE 1 TABLET BY MOUTH TWICE A DAY WITH FOOD 180 tablet 3   cholestyramine (QUESTRAN) 4 GM/DOSE powder Take 1 packet (4 g total) by mouth daily as needed (for colitis flares (mix and drink)). 348.6 g 1   fluorouracil (EFUDEX) 5 % cream Apply 1 application topically daily as needed (as directed- affected sites).     losartan (COZAAR) 25 MG tablet Take 1 tablet (25 mg total) by mouth daily. 90 tablet 3    mometasone (ELOCON) 0.1 % cream Apply topically daily. 45 g 1   Multiple Vitamins-Minerals (PRESERVISION AREDS PO) Take 1 capsule by mouth 2 (two) times daily.     nitroGLYCERIN (NITROSTAT) 0.4 MG SL tablet PLACE 1 TABLET (0.4 MG TOTAL) UNDER THE TONGUE EVERY 5 MINUTES X 3 DOSES AS NEEDED FOR CHEST PAIN. 25 tablet 3   Omega-3 Fatty Acids ([employer] FISH OIL) 1200 MG CAPS Take 1,200 mg by mouth daily with breakfast.     predniSONE (DELTASONE) 20 MG tablet 3 tabs poqday 1-2, 2 tabs poqday 3-4, 1 tab poqday 5-6 12 tablet 0   rosuvastatin (CRESTOR) 20 MG tablet Take 1 tablet (20 mg total) by mouth daily. 90 tablet 3   sulfamethoxazole-trimethoprim (BACTRIM DS) 800-160 MG tablet Take 1 tablet by mouth 2 (two) times daily. 14 tablet 0   triamcinolone cream (KENALOG) 0.1 % APPLY 1 APPLICATION. TOPICALLY 2 (TWO) TIMES DAILY. 60 g 0   No current facility-administered medications on file prior to visit.   Allergies  Allergen Reactions   Aleve [Naproxen Sodium] Nausea And Vomiting and Other (See Comments)    No problem with other NSAIDs   Amoxicillin Other (See Comments)    Vertigo Did it involve swelling of the face/tongue/throat, SOB, or low BP? No Did it involve sudden or severe rash/hives, skin peeling, or any reaction on the inside of your mouth or nose? No Did you need to seek medical attention at a hospital or doctor's office? unknown When did it last happen?   yrs ago    dizziness If all above answers are "NO", may proceed with cephalosporin use.    Gabapentin Other (See Comments)    Might be sensitive to this- took 100 mg twice within a couple of hours = Ended up falling from a standing position and had to be taken to the E.D.   Morphine And Related Other (See Comments)    Dizziness    Penicillins Diarrhea and Other (See Comments)    Diarrhea Did it involve swelling of the face/tongue/throat, SOB, or low BP? No Did it involve sudden or severe rash/hives, skin peeling, or any reaction on  the inside of your mouth or nose? No Did you need to seek medical attention at a hospital or doctor's office? Unknown When did it last happen?    years ago   If all above answers are "NO", may proceed with cephalosporin use.    Social History   Socioeconomic History   Marital status: Married    Spouse name: Financial risk analyst   Number of children: Not on file   Years of education: 12   Highest education level: High school graduate  Occupational History   Occupation: Retired  Tobacco Use   Smoking status: Former    Packs/day: 1.00    Years: 8.00    Additional pack years: 0.00    Total pack years: 8.00    Types: Cigarettes   Smokeless tobacco: Never  Vaping Use   Vaping Use: Never used  Substance and Sexual Activity   Alcohol use: Yes    Comment: occasional   Drug use: No   Sexual activity: Yes    Comment: married  Other Topics Concern   Not on file  Social History Narrative   Not on file   Social Determinants of Health   Financial Resource Strain: Low Risk  (09/05/2021)   Overall Financial Resource Strain (CARDIA)    Difficulty of Paying Living Expenses: Not hard at all  Food Insecurity: No Food Insecurity (09/05/2021)   Hunger Vital Sign    Worried About Running Out of Food in the Last Year: Never true    Ran Out of Food in the Last Year: Never true  Transportation Needs: No Transportation Needs (09/05/2021)   PRAPARE - Hydrologist (Medical): No    Lack of Transportation (Non-Medical): No  Physical Activity: Sufficiently Active (09/05/2021)   Exercise Vital Sign    Days of Exercise per Week: 5 days    Minutes of Exercise per Session: 30 min  Stress: No Stress Concern Present (09/05/2021)   [place]    Feeling of Stress : Not at all  Social Connections: [place] (09/05/2021)   Social Connection and Isolation Panel [NHANES]    Frequency of Communication with Friends and  Family: More than three times a week    Frequency of Social Gatherings with Friends and Family: More than three times a week    Attends Religious Services: More than 4 times per year    Active Member of [employer] or Organizations: Yes    Attends Archivist Meetings: More than 4 times per year    Marital Status: Married  Human resources officer Violence: Not At Risk (09/05/2021)   Humiliation, Afraid, Rape, and Kick questionnaire    Fear of Current or Ex-Partner: No    Emotionally Abused: No    Physically Abused: No    Sexually Abused: No   Review of Systems  All other systems reviewed and are negative.      Objective:   Physical Exam Vitals reviewed.  Cardiovascular:     Rate and Rhythm: Normal rate and regular rhythm.     Heart sounds: Normal heart sounds.  Pulmonary:     Effort: Pulmonary effort is normal. No respiratory distress.     Breath sounds: Normal breath sounds. No wheezing or rales.  Musculoskeletal:     Lumbar back: No spasms, tenderness or bony tenderness. Normal range of motion. Negative right straight leg raise test and negative left straight leg raise test.       Back:     Right hip: No tenderness or bony tenderness. Normal range of motion.     Left hip: No tenderness or bony tenderness. Normal range of motion.        Assessment & Plan:  Sacroiliitis - Plan: Ambulatory referral to Orthopedic Surgery I believe the patient is dealing with sacroiliitis.  I have recommended orthopedic consultation for possible cortisone injections in the SI joints bilaterally.  I would like to x-ray his back however given the fact he is going to see orthopedics, I will defer the x-rays  to their office since he will likely be done there as well to avoid redundancy.

## 2022-06-18 ENCOUNTER — Telehealth: Payer: Self-pay

## 2022-06-18 NOTE — Telephone Encounter (Signed)
Pt's wife called in to ask pcp not to put in referral to [facility]. Pt's wife stated that granddaughter called directly and made an appt for pt. Please advise.   Cb#: [phone]

## 2022-06-23 ENCOUNTER — Ambulatory Visit (INDEPENDENT_AMBULATORY_CARE_PROVIDER_SITE_OTHER): Payer: PPO | Admitting: Family Medicine

## 2022-06-23 VITALS — BP 120/68 | HR 76 | Temp 97.6°F | Ht 70.0 in | Wt 140.0 lb

## 2022-06-23 DIAGNOSIS — S51802D Unspecified open wound of left forearm, subsequent encounter: Secondary | ICD-10-CM | POA: Diagnosis not present

## 2022-06-23 DIAGNOSIS — T148XXA Other injury of unspecified body region, initial encounter: Secondary | ICD-10-CM

## 2022-06-23 NOTE — Assessment & Plan Note (Signed)
Wounds appear to be healing well. Yellow slough covers each wound. No warmth, redness, or purulent drainage. I recommend continuing with neosporin and non adherent dressings when soiled. Instructed to keep the area clean and dry. His left hand does have some swelling, I believe this is due to him wrapping his forearm nightly with ACE. Instructed to cover wound and avoid wrapping and follow up if swelling persists. No pain to left hand, nontender to palpation.

## 2022-06-23 NOTE — Progress Notes (Signed)
   Acute Office Visit  Subjective:     Patient ID: Eugene [name], male    DOB: [birth year], 89 y.o.   MRN: [id number]  Chief Complaint  Patient presents with   Follow-up    wanting to have skin/wound rechecked/ having some swelling in ankles and hands      HPI Patient is in today for wound follow-up. He had a fall in February of this year that resulted in a skin tear to his left forearm and left knee. Since then he has been using aquaphor and bandaids and wrapping his arm with an ace wrap due to drainage. No fever, warmth, purulent drainage, or worsening erythema. Denies pain to left hand and swelling is intermittient.  Review of Systems  All other systems reviewed and are negative.       Objective:    BP 120/68   Pulse 76   Temp 97.6 F (36.4 C) (Oral)   Ht 5\' 10"  (1.778 m)   Wt 140 lb (63.5 kg)   SpO2 96%   BMI 20.09 kg/m    Physical Exam Vitals and nursing note reviewed.  Constitutional:      Appearance: Normal appearance. He is normal weight.  HENT:     Head: Normocephalic and atraumatic.  Musculoskeletal:     Left hand: Swelling present. No tenderness or bony tenderness. Normal range of motion. Normal strength. Normal sensation.  Skin:    General: Skin is warm and dry.     Capillary Refill: Capillary refill takes less than 2 seconds.     Findings: Wound present.       Neurological:     General: No focal deficit present.     Mental Status: He is alert and oriented to person, place, and time. Mental status is at baseline.  Psychiatric:        Mood and Affect: Mood normal.        Behavior: Behavior normal.        Thought Content: Thought content normal.        Judgment: Judgment normal.     No results found for any visits on 06/23/22.      Assessment & Plan:   Problem List Items Addressed This Visit       Musculoskeletal and Integument   Open wound of skin - Primary    Wounds appear to be healing well. Yellow slough covers each wound. No warmth,  redness, or purulent drainage. I recommend continuing with neosporin and non adherent dressings when soiled. Instructed to keep the area clean and dry. His left hand does have some swelling, I believe this is due to him wrapping his forearm nightly with ACE. Instructed to cover wound and avoid wrapping and follow up if swelling persists. No pain to left hand, nontender to palpation.       No orders of the defined types were placed in this encounter.   Return if symptoms worsen or fail to improve.  Park Meo, FNP

## 2022-06-26 ENCOUNTER — Other Ambulatory Visit: Payer: Self-pay | Admitting: Family Medicine

## 2022-06-26 DIAGNOSIS — M545 Low back pain, unspecified: Secondary | ICD-10-CM | POA: Diagnosis not present

## 2022-06-26 DIAGNOSIS — M25552 Pain in left hip: Secondary | ICD-10-CM | POA: Diagnosis not present

## 2022-06-26 DIAGNOSIS — M25551 Pain in right hip: Secondary | ICD-10-CM | POA: Diagnosis not present

## 2022-06-26 NOTE — Telephone Encounter (Signed)
Requested Prescriptions  Pending Prescriptions Disp Refills   nitroGLYCERIN (NITROSTAT) 0.4 MG SL tablet [Pharmacy Med Name: NITROGLYCERIN 0.4 MG TABLET SL] 25 tablet 3    Sig: PLACE 1 TABLET UNDER THE TONGUE EVERY 5 MINUTES FOR 3 DOSES AS NEEDED FOR CHEST PAIN.     Cardiovascular:  Nitrates Passed - 06/26/2022 11:25 AM      Passed - Last BP in normal range    BP Readings from Last 1 Encounters:  06/23/22 120/68         Passed - Last Heart Rate in normal range    Pulse Readings from Last 1 Encounters:  06/23/22 76         Passed - Valid encounter within last 12 months    Recent Outpatient Visits           10 months ago Collagenous colitis   [facility] Donita Brooks, MD   10 months ago Cellulitis of left lower extremity   [facility] Tanya Nones Priscille Heidelberg, MD   11 months ago Laceration of left lower extremity, initial encounter   [facility] Pickard, Priscille Heidelberg, MD   1 year ago Irritant dermatitis   [facility] Pickard, Priscille Heidelberg, MD   1 year ago Irritant dermatitis   [facility] Pickard, Priscille Heidelberg, MD

## 2022-07-01 DIAGNOSIS — Z89511 Acquired absence of right leg below knee: Secondary | ICD-10-CM | POA: Diagnosis not present

## 2022-07-01 DIAGNOSIS — Z87891 Personal history of nicotine dependence: Secondary | ICD-10-CM | POA: Diagnosis not present

## 2022-07-01 DIAGNOSIS — H3530 Unspecified macular degeneration: Secondary | ICD-10-CM | POA: Diagnosis not present

## 2022-07-01 DIAGNOSIS — I110 Hypertensive heart disease with heart failure: Secondary | ICD-10-CM | POA: Diagnosis not present

## 2022-07-01 DIAGNOSIS — G546 Phantom limb syndrome with pain: Secondary | ICD-10-CM | POA: Diagnosis not present

## 2022-07-01 DIAGNOSIS — G8929 Other chronic pain: Secondary | ICD-10-CM | POA: Diagnosis not present

## 2022-07-01 DIAGNOSIS — I5042 Chronic combined systolic (congestive) and diastolic (congestive) heart failure: Secondary | ICD-10-CM | POA: Diagnosis not present

## 2022-07-01 DIAGNOSIS — Z955 Presence of coronary angioplasty implant and graft: Secondary | ICD-10-CM | POA: Diagnosis not present

## 2022-07-01 DIAGNOSIS — Z7982 Long term (current) use of aspirin: Secondary | ICD-10-CM | POA: Diagnosis not present

## 2022-07-01 DIAGNOSIS — I10 Essential (primary) hypertension: Secondary | ICD-10-CM | POA: Diagnosis not present

## 2022-07-01 DIAGNOSIS — I2510 Atherosclerotic heart disease of native coronary artery without angina pectoris: Secondary | ICD-10-CM | POA: Diagnosis not present

## 2022-07-01 DIAGNOSIS — E785 Hyperlipidemia, unspecified: Secondary | ICD-10-CM | POA: Diagnosis not present

## 2022-07-08 ENCOUNTER — Encounter: Payer: Self-pay | Admitting: Family Medicine

## 2022-07-08 ENCOUNTER — Ambulatory Visit (INDEPENDENT_AMBULATORY_CARE_PROVIDER_SITE_OTHER): Payer: PPO | Admitting: Family Medicine

## 2022-07-08 VITALS — BP 116/62 | HR 65 | Ht 70.0 in | Wt 141.6 lb

## 2022-07-08 DIAGNOSIS — G8929 Other chronic pain: Secondary | ICD-10-CM

## 2022-07-08 DIAGNOSIS — M545 Low back pain, unspecified: Secondary | ICD-10-CM

## 2022-07-08 NOTE — Progress Notes (Signed)
Subjective:    Patient ID: Eugene [name], male    DOB: [birth year], 89 y.o.   MRN: [id number] 06/05/22 Patient has a history of a right BKA.  Patient reports posterior hip pain bilaterally.  The pain is over the SI joint.  He states that in the mornings when he gets out of bed he has an aching pain in that area.  It does not radiate.  It hurts to stand.  He denies any numbness or tingling radiating down his leg.  He denies any saddle anesthesia or bowel or bladder incontinence.  He denies any leg weakness.  He denies any dysuria or urgency or frequency.  He denies any fevers or chills.  He denies any rash.  He has full range of motion in both hips.  Flexion, extension, external and internal rotation of either hip does not elicit any pain.  At that time, my plan was: I believe the patient is dealing with sacroiliitis.  Begin prednisone taper pack as he is unable to take NSAIDs.  Temporarily discontinue budesonide.  Patient is not taking budesonide anyway.  Reassess in 1 week and proceed with x-rays if not improving.  Recheck immediately if he develops any dysuria urgency frequency or hematuria  06/16/22 Patient continues to deal with pain in both SI joints bilaterally.  They ache and throb.  The prednisone gave him no relief.  He is unable to take NSAIDs due to his cardiovascular history no pain with flexion or extension of his hip joints.  There is no pain in his lower back.  He denies any symptoms of sciatica.  At that time, my plan was: I believe the patient is dealing with sacroiliitis.  I have recommended orthopedic consultation for possible cortisone injections in the SI joints bilaterally.  I would like to x-ray his back however given the fact he is going to see orthopedics, I will defer the x-rays to their office since it will likely be done there as well to avoid redundancy.  07/08/22 Patient returns today continuing to complain of low back pain.  However now it has more of a vague pain located above  each gluteus muscle.  He states it is worse in the muscles.  Throughout the day it tends to get better.  He denies any sciatica.  He denies any bowel or bladder incontinence.  He denies any saddle anesthesia.  I had referred the patient to orthopedics but I have not received any report back from them.  He states that he saw the orthopedist however he does not remember what the orthopedist said to him.  He remembers getting an x-ray.  However he does not recall the treatment plan. Past Medical History:  Diagnosis Date   Amputated right leg    CAD (coronary artery disease)    a. 09/2017 - acute MI -  emergent cath showing 99% stenosis in midLAD with thrombotic stenosis at the bifurcation of the second diagonal, which was relatively small in diameter. He underwent placement of DES to midLAD which jailed the second diagonal.    Chronic combined systolic and diastolic heart failure    Collagenous colitis    Dr. Darlyn Read   History of ST elevation myocardial infarction (STEMI)    09/2017: LAD   History of stomach ulcers    Hyperlipidemia LDL goal <70    Ischemic cardiomyopathy    Squamous cell carcinoma of skin 03/27/2014   in situ-left temple (txpbx)   Squamous cell carcinoma of skin 02/24/2018  in situ-left cheek (CX35FU)   Past Surgical History:  Procedure Laterality Date   BACK SURGERY     CARDIAC CATHETERIZATION     CORONARY STENT INTERVENTION N/A 10/07/2017   Procedure: CORONARY STENT INTERVENTION;  Surgeon: Iran Ouch, MD;  Location: [facility];  Service: Cardiovascular;  Laterality: N/A;   CORONARY/GRAFT ACUTE MI REVASCULARIZATION N/A 10/07/2017   Procedure: Coronary/Graft Acute MI Revascularization;  Surgeon: Iran Ouch, MD;  Location: [facility];  Service: Cardiovascular;  Laterality: N/A;   LEFT HEART CATH AND CORONARY ANGIOGRAPHY N/A 10/07/2017   Procedure: LEFT HEART CATH AND CORONARY ANGIOGRAPHY;  Surgeon: Iran Ouch, MD;  Location: [facility];  Service: Cardiovascular;  Laterality: N/A;   LEG AMPUTATION     Right Below knee   STOMACH SURGERY     Billroth II gastrojejunostomy   TOTAL KNEE ARTHROPLASTY     Current Outpatient Medications on File Prior to Visit  Medication Sig Dispense Refill   ALPRAZolam (XANAX) 0.5 MG tablet Take 1 tablet (0.5 mg total) by mouth at bedtime as needed. for sleep 30 tablet 3   aspirin 81 MG tablet Take 81 mg by mouth daily.     budesonide (ENTOCORT EC) 3 MG 24 hr capsule TAKE 2 CAPSULES (6 MG TOTAL) BY MOUTH DAILY. 180 capsule 3   carvedilol (COREG) 3.125 MG tablet TAKE 1 TABLET BY MOUTH TWICE A DAY WITH FOOD 180 tablet 3   cholestyramine (QUESTRAN) 4 GM/DOSE powder Take 1 packet (4 g total) by mouth daily as needed (for colitis flares (mix and drink)). 348.6 g 1   fluorouracil (EFUDEX) 5 % cream Apply 1 application topically daily as needed (as directed- affected sites).     losartan (COZAAR) 25 MG tablet Take 1 tablet (25 mg total) by mouth daily. 90 tablet 3   mometasone (ELOCON) 0.1 % cream Apply topically daily. 45 g 1   Multiple Vitamins-Minerals (PRESERVISION AREDS PO) Take 1 capsule by mouth 2 (two) times daily.     nitroGLYCERIN (NITROSTAT) 0.4 MG SL tablet PLACE 1 TABLET UNDER THE TONGUE EVERY 5 MINUTES FOR 3 DOSES AS NEEDED FOR CHEST PAIN. 25 tablet 3   Omega-3 Fatty Acids ([employer] FISH OIL) 1200 MG CAPS Take 1,200 mg by mouth daily with breakfast.     predniSONE (DELTASONE) 20 MG tablet 3 tabs poqday 1-2, 2 tabs poqday 3-4, 1 tab poqday 5-6 (Patient not taking: Reported on 06/23/2022) 12 tablet 0   rosuvastatin (CRESTOR) 20 MG tablet Take 1 tablet (20 mg total) by mouth daily. 90 tablet 3   sulfamethoxazole-trimethoprim (BACTRIM DS) 800-160 MG tablet Take 1 tablet by mouth 2 (two) times daily. (Patient not taking: Reported on 06/23/2022) 14 tablet 0   triamcinolone cream (KENALOG) 0.1 % APPLY 1 APPLICATION. TOPICALLY 2 (TWO) TIMES DAILY. 60 g 0   No current facility-administered medications  on file prior to visit.   Allergies  Allergen Reactions   Aleve [Naproxen Sodium] Nausea And Vomiting and Other (See Comments)    No problem with other NSAIDs   Amoxicillin Other (See Comments)    Vertigo Did it involve swelling of the face/tongue/throat, SOB, or low BP? No Did it involve sudden or severe rash/hives, skin peeling, or any reaction on the inside of your mouth or nose? No Did you need to seek medical attention at a hospital or doctor's office? unknown When did it last happen?   yrs ago    dizziness If all above answers  are "NO", may proceed with cephalosporin use.    Gabapentin Other (See Comments)    Might be sensitive to this- took 100 mg twice within a couple of hours = Ended up falling from a standing position and had to be taken to the E.D.   Morphine And Related Other (See Comments)    Dizziness    Penicillins Diarrhea and Other (See Comments)    Diarrhea Did it involve swelling of the face/tongue/throat, SOB, or low BP? No Did it involve sudden or severe rash/hives, skin peeling, or any reaction on the inside of your mouth or nose? No Did you need to seek medical attention at a hospital or doctor's office? Unknown When did it last happen?    years ago   If all above answers are "NO", may proceed with cephalosporin use.    Social History   Socioeconomic History   Marital status: Married    Spouse name: Sport and exercise psychologist   Number of children: Not on file   Years of education: 12   Highest education level: High school graduate  Occupational History   Occupation: Retired  Tobacco Use   Smoking status: Former    Packs/day: 1.00    Years: 8.00    Additional pack years: 0.00    Total pack years: 8.00    Types: Cigarettes   Smokeless tobacco: Never  Vaping Use   Vaping Use: Never used  Substance and Sexual Activity   Alcohol use: Yes    Comment: occasional   Drug use: No   Sexual activity: Yes    Comment: married  Other Topics Concern   Not on file  Social  History Narrative   Not on file   Social Determinants of Health   Financial Resource Strain: Low Risk  (09/05/2021)   Overall Financial Resource Strain (CARDIA)    Difficulty of Paying Living Expenses: Not hard at all  Food Insecurity: No Food Insecurity (09/05/2021)   Hunger Vital Sign    Worried About Running Out of Food in the Last Year: Never true    Ran Out of Food in the Last Year: Never true  Transportation Needs: No Transportation Needs (09/05/2021)   PRAPARE - Administrator, Civil Service (Medical): No    Lack of Transportation (Non-Medical): No  Physical Activity: Sufficiently Active (09/05/2021)   Exercise Vital Sign    Days of Exercise per Week: 5 days    Minutes of Exercise per Session: 30 min  Stress: No Stress Concern Present (09/05/2021)   [employer] of Occupational Health - Occupational Stress Questionnaire    Feeling of Stress : Not at all  Social Connections: Socially Integrated (09/05/2021)   Social Connection and Isolation Panel [NHANES]    Frequency of Communication with Friends and Family: More than three times a week    Frequency of Social Gatherings with Friends and Family: More than three times a week    Attends Religious Services: More than 4 times per year    Active Member of [employer] or Organizations: Yes    Attends Banker Meetings: More than 4 times per year    Marital Status: Married  Catering manager Violence: Not At Risk (09/05/2021)   Humiliation, Afraid, Rape, and Kick questionnaire    Fear of Current or Ex-Partner: No    Emotionally Abused: No    Physically Abused: No    Sexually Abused: No   Review of Systems  All other systems reviewed and are negative.  Objective:   Physical Exam Vitals reviewed.  Cardiovascular:     Rate and Rhythm: Normal rate and regular rhythm.     Heart sounds: Normal heart sounds.  Pulmonary:     Effort: Pulmonary effort is normal. No respiratory distress.     Breath sounds:  Normal breath sounds. No wheezing or rales.  Musculoskeletal:     Lumbar back: No spasms, tenderness or bony tenderness. Decreased range of motion. Negative right straight leg raise test and negative left straight leg raise test.       Back:     Right hip: No tenderness or bony tenderness. Normal range of motion.     Left hip: No tenderness or bony tenderness. Normal range of motion.        Assessment & Plan:  Chronic bilateral low back pain without sciatica This appears to be more of a chronic low back pain now rather than sacroiliitis.  We discussed Tylenol versus muscle relaxers.  I would like to avoid the muscle relaxer because of concern about age-related cognitive impairment.  The patient previously had a very bad reaction with gabapentin.  I want to avoid NSAIDs because of his coronary artery disease and antiplatelet therapy.  Therefore I recommended Tylenol 1000 mg twice daily.

## 2022-07-10 DIAGNOSIS — Z85828 Personal history of other malignant neoplasm of skin: Secondary | ICD-10-CM | POA: Diagnosis not present

## 2022-07-10 DIAGNOSIS — D485 Neoplasm of uncertain behavior of skin: Secondary | ICD-10-CM | POA: Diagnosis not present

## 2022-07-10 DIAGNOSIS — D225 Melanocytic nevi of trunk: Secondary | ICD-10-CM | POA: Diagnosis not present

## 2022-07-10 DIAGNOSIS — L821 Other seborrheic keratosis: Secondary | ICD-10-CM | POA: Diagnosis not present

## 2022-07-10 DIAGNOSIS — L570 Actinic keratosis: Secondary | ICD-10-CM | POA: Diagnosis not present

## 2022-07-10 DIAGNOSIS — C44629 Squamous cell carcinoma of skin of left upper limb, including shoulder: Secondary | ICD-10-CM | POA: Diagnosis not present

## 2022-07-10 DIAGNOSIS — Z08 Encounter for follow-up examination after completed treatment for malignant neoplasm: Secondary | ICD-10-CM | POA: Diagnosis not present

## 2022-07-10 DIAGNOSIS — L814 Other melanin hyperpigmentation: Secondary | ICD-10-CM | POA: Diagnosis not present

## 2022-07-10 DIAGNOSIS — C44622 Squamous cell carcinoma of skin of right upper limb, including shoulder: Secondary | ICD-10-CM | POA: Diagnosis not present

## 2022-07-15 ENCOUNTER — Ambulatory Visit (INDEPENDENT_AMBULATORY_CARE_PROVIDER_SITE_OTHER): Payer: PPO | Admitting: Family Medicine

## 2022-07-15 ENCOUNTER — Encounter: Payer: Self-pay | Admitting: Family Medicine

## 2022-07-15 VITALS — BP 120/72 | HR 67 | Temp 97.6°F | Ht 70.0 in | Wt 142.0 lb

## 2022-07-15 DIAGNOSIS — S81812A Laceration without foreign body, left lower leg, initial encounter: Secondary | ICD-10-CM

## 2022-07-15 NOTE — Progress Notes (Signed)
Subjective:    Patient ID: Eugene [name], male    DOB: 10-29-1931, 89 y.o.   MRN: [id number]  Yesterday, the patient was cutting branches in his yard.  A branch fell on his left leg ripping open the skin over his left shin.  He suffered a's large triangular-shaped skin tear.  He put gauze on it to stop the bleeding.  However it still bleeding through the gauze.  Today on examination, the patient has a large skin tear.  It is roughly 3-1/2 inches x 3-1/2 inches as diagrammed below.  The skin is very thin and likely a vascularized.  It would not hold stitches.  He is oozing blood from the underlying subcutaneous tissue  Past Medical History:  Diagnosis Date   Amputated right leg (HCC)    CAD (coronary artery disease)    a. 09/2017 - acute MI -  emergent cath showing 99% stenosis in midLAD with thrombotic stenosis at the bifurcation of the second diagonal, which was relatively small in diameter. He underwent placement of DES to midLAD which jailed the second diagonal.    Chronic combined systolic and diastolic heart failure (HCC)    Collagenous colitis    Dr. Darlyn Read   History of ST elevation myocardial infarction (STEMI)    09/2017: LAD   History of stomach ulcers    Hyperlipidemia LDL goal <70    Ischemic cardiomyopathy    Squamous cell carcinoma of skin 03/27/2014   in situ-left temple (txpbx)   Squamous cell carcinoma of skin 02/24/2018   in situ-left cheek (CX35FU)   Past Surgical History:  Procedure Laterality Date   BACK SURGERY     CARDIAC CATHETERIZATION     CORONARY STENT INTERVENTION N/A 10/07/2017   Procedure: CORONARY STENT INTERVENTION;  Surgeon: Iran Ouch, MD;  Location: [facility];  Service: Cardiovascular;  Laterality: N/A;   CORONARY/GRAFT ACUTE MI REVASCULARIZATION N/A 10/07/2017   Procedure: Coronary/Graft Acute MI Revascularization;  Surgeon: Iran Ouch, MD;  Location: [facility];  Service: Cardiovascular;  Laterality: N/A;   LEFT  HEART CATH AND CORONARY ANGIOGRAPHY N/A 10/07/2017   Procedure: LEFT HEART CATH AND CORONARY ANGIOGRAPHY;  Surgeon: Iran Ouch, MD;  Location: [facility];  Service: Cardiovascular;  Laterality: N/A;   LEG AMPUTATION     Right Below knee   STOMACH SURGERY     Billroth II gastrojejunostomy   TOTAL KNEE ARTHROPLASTY     Current Outpatient Medications on File Prior to Visit  Medication Sig Dispense Refill   ALPRAZolam (XANAX) 0.5 MG tablet Take 1 tablet (0.5 mg total) by mouth at bedtime as needed. for sleep 30 tablet 3   aspirin 81 MG tablet Take 81 mg by mouth daily.     budesonide (ENTOCORT EC) 3 MG 24 hr capsule TAKE 2 CAPSULES (6 MG TOTAL) BY MOUTH DAILY. 180 capsule 3   carvedilol (COREG) 3.125 MG tablet TAKE 1 TABLET BY MOUTH TWICE A DAY WITH FOOD 180 tablet 3   cholestyramine (QUESTRAN) 4 GM/DOSE powder Take 1 packet (4 g total) by mouth daily as needed (for colitis flares (mix and drink)). 348.6 g 1   fluorouracil (EFUDEX) 5 % cream Apply 1 application topically daily as needed (as directed- affected sites).     losartan (COZAAR) 25 MG tablet Take 1 tablet (25 mg total) by mouth daily. 90 tablet 3   mometasone (ELOCON) 0.1 % cream Apply topically daily. 45 g 1   Multiple Vitamins-Minerals (  PRESERVISION AREDS PO) Take 1 capsule by mouth 2 (two) times daily.     nitroGLYCERIN (NITROSTAT) 0.4 MG SL tablet PLACE 1 TABLET UNDER THE TONGUE EVERY 5 MINUTES FOR 3 DOSES AS NEEDED FOR CHEST PAIN. 25 tablet 3   Omega-3 Fatty Acids ([employer] FISH OIL) 1200 MG CAPS Take 1,200 mg by mouth daily with breakfast.     predniSONE (DELTASONE) 20 MG tablet 3 tabs poqday 1-2, 2 tabs poqday 3-4, 1 tab poqday 5-6 12 tablet 0   rosuvastatin (CRESTOR) 20 MG tablet Take 1 tablet (20 mg total) by mouth daily. 90 tablet 3   sulfamethoxazole-trimethoprim (BACTRIM DS) 800-160 MG tablet Take 1 tablet by mouth 2 (two) times daily. 14 tablet 0   triamcinolone cream (KENALOG) 0.1 % APPLY 1 APPLICATION. TOPICALLY  2 (TWO) TIMES DAILY. 60 g 0   No current facility-administered medications on file prior to visit.   Allergies  Allergen Reactions   Aleve [Naproxen Sodium] Nausea And Vomiting and Other (See Comments)    No problem with other NSAIDs   Amoxicillin Other (See Comments)    Vertigo Did it involve swelling of the face/tongue/throat, SOB, or low BP? No Did it involve sudden or severe rash/hives, skin peeling, or any reaction on the inside of your mouth or nose? No Did you need to seek medical attention at a hospital or doctor's office? unknown When did it last happen?   yrs ago    dizziness If all above answers are "NO", may proceed with cephalosporin use.    Gabapentin Other (See Comments)    Might be sensitive to this- took 100 mg twice within a couple of hours = Ended up falling from a standing position and had to be taken to the E.D.   Morphine And Related Other (See Comments)    Dizziness    Penicillins Diarrhea and Other (See Comments)    Diarrhea Did it involve swelling of the face/tongue/throat, SOB, or low BP? No Did it involve sudden or severe rash/hives, skin peeling, or any reaction on the inside of your mouth or nose? No Did you need to seek medical attention at a hospital or doctor's office? Unknown When did it last happen?    years ago   If all above answers are "NO", may proceed with cephalosporin use.    Social History   Socioeconomic History   Marital status: Married    Spouse name: Sport and exercise psychologist   Number of children: Not on file   Years of education: 12   Highest education level: High school graduate  Occupational History   Occupation: Retired  Tobacco Use   Smoking status: Former    Packs/day: 1.00    Years: 8.00    Additional pack years: 0.00    Total pack years: 8.00    Types: Cigarettes   Smokeless tobacco: Never  Vaping Use   Vaping Use: Never used  Substance and Sexual Activity   Alcohol use: Yes    Comment: occasional   Drug use: No   Sexual  activity: Yes    Comment: married  Other Topics Concern   Not on file  Social History Narrative   Not on file   Social Determinants of Health   Financial Resource Strain: Low Risk  (09/05/2021)   Overall Financial Resource Strain (CARDIA)    Difficulty of Paying Living Expenses: Not hard at all  Food Insecurity: No Food Insecurity (09/05/2021)   Hunger Vital Sign    Worried About Running Out of Food  in the Last Year: Never true    Ran Out of Food in the Last Year: Never true  Transportation Needs: No Transportation Needs (09/05/2021)   PRAPARE - Administrator, Civil Service (Medical): No    Lack of Transportation (Non-Medical): No  Physical Activity: Sufficiently Active (09/05/2021)   Exercise Vital Sign    Days of Exercise per Week: 5 days    Minutes of Exercise per Session: 30 min  Stress: No Stress Concern Present (09/05/2021)   [employer] of Occupational Health - Occupational Stress Questionnaire    Feeling of Stress : Not at all  Social Connections: Socially Integrated (09/05/2021)   Social Connection and Isolation Panel [NHANES]    Frequency of Communication with Friends and Family: More than three times a week    Frequency of Social Gatherings with Friends and Family: More than three times a week    Attends Religious Services: More than 4 times per year    Active Member of [employer] or Organizations: Yes    Attends Banker Meetings: More than 4 times per year    Marital Status: Married  Catering manager Violence: Not At Risk (09/05/2021)   Humiliation, Afraid, Rape, and Kick questionnaire    Fear of Current or Ex-Partner: No    Emotionally Abused: No    Physically Abused: No    Sexually Abused: No   Review of Systems  All other systems reviewed and are negative.      Objective:   Physical Exam Vitals reviewed.  Cardiovascular:     Rate and Rhythm: Normal rate and regular rhythm.     Heart sounds: Normal heart sounds.  Pulmonary:      Effort: Pulmonary effort is normal. No respiratory distress.     Breath sounds: Normal breath sounds. No wheezing or rales.  Musculoskeletal:     Right hip: No tenderness or bony tenderness. Normal range of motion.     Left hip: No tenderness or bony tenderness. Normal range of motion.     Left lower leg: Deformity and laceration present. No tenderness or bony tenderness.       Legs:        Assessment & Plan:  Skin tear of left lower leg without complication, initial encounter I soaked the gauze on his left leg with hydrogen peroxide to be able to remove it from the skin tear without tearing the tissue further.  However he was experiencing bleeding from the underlying subcutaneous tissue and exposed fascia.  The torn piece of skin is too thin to hold stitches.  Therefore I put several pieces of petroleum gauze directly over the wound to cover it completely.  I covered the gauze with Silvadene and then put 2 pieces of nonadherent gauze directly over top of that.  I then applied regular 4 x 4's directly over the nonadherent gauze and wrapped it firmly with Coban to create a pressure dressing.  Leave dressing in place for 24 to 48 hours.  Recheck here on Thursday.  Once we have achieved hemostasis we will debride avascularized tissue and likely cover the wound with Tegaderm to facilitate healing.  Keep clean and dry for the next 48 hours

## 2022-07-17 ENCOUNTER — Encounter: Payer: Self-pay | Admitting: Family Medicine

## 2022-07-17 ENCOUNTER — Ambulatory Visit (INDEPENDENT_AMBULATORY_CARE_PROVIDER_SITE_OTHER): Payer: PPO | Admitting: Family Medicine

## 2022-07-17 VITALS — BP 112/62 | HR 74 | Temp 97.6°F | Ht 69.5 in | Wt 142.0 lb

## 2022-07-17 DIAGNOSIS — S81812D Laceration without foreign body, left lower leg, subsequent encounter: Secondary | ICD-10-CM

## 2022-07-17 NOTE — Progress Notes (Signed)
Subjective:    Patient ID: Eugene [name], male    DOB: [birth year], 89 y.o.   MRN: [id number] 07/14/21 Yesterday, the patient was cutting branches in his yard.  A branch fell on his left leg ripping open the skin over his left shin.  He suffered a large triangular-shaped skin tear.  He put gauze on it to stop the bleeding.  However it still bleeding through the gauze.  Today on examination, the patient has a large skin tear.  It is roughly 3-1/2 inches x 3-1/2 inches as diagrammed below.  The skin is very thin and likely avascularized.  It would not hold stitches.  He is oozing blood from the underlying subcutaneous tissue.  At that time, my plan was:  I soaked the gauze on his left leg with hydrogen peroxide to be able to remove it from the skin tear without tearing the tissue further.  However he was experiencing bleeding from the underlying subcutaneous tissue and exposed fascia.  The torn piece of skin is too thin to hold stitches.  Therefore I put several pieces of petroleum gauze directly over the wound to cover it completely.  I covered the gauze with Silvadene and then put 2 pieces of nonadherent gauze directly over top of that.  I then applied regular 4 x 4's directly over the nonadherent gauze and wrapped it firmly with Coban to create a pressure dressing.  Leave dressing in place for 24 to 48 hours.  Recheck here on Thursday.  Once we have achieved hemostasis we will debride avascularized tissue and likely cover the wound with Tegaderm to facilitate healing.  Keep clean and dry for the next 48 hours  07/17/22 2 days ago, the patient was bleeding substantially from the entire wound bed.  Today after I removed all the gauze, he was bleeding only from the top 10% of the wound.  However I still do not believe he is ready quite to switch to Tegaderm as I do not feel that these allow sufficient pressure to achieve hemostasis given that he is on an aspirin.  There is no evidence of cellulitis.  He denies  any pain. Past Medical History:  Diagnosis Date   Amputated right leg (HCC)    CAD (coronary artery disease)    a. 09/2017 - acute MI -  emergent cath showing 99% stenosis in midLAD with thrombotic stenosis at the bifurcation of the second diagonal, which was relatively small in diameter. He underwent placement of DES to midLAD which jailed the second diagonal.    Chronic combined systolic and diastolic heart failure (HCC)    Collagenous colitis    Dr. Darlyn Read   History of ST elevation myocardial infarction (STEMI)    09/2017: LAD   History of stomach ulcers    Hyperlipidemia LDL goal <70    Ischemic cardiomyopathy    Squamous cell carcinoma of skin 03/27/2014   in situ-left temple (txpbx)   Squamous cell carcinoma of skin 02/24/2018   in situ-left cheek (CX35FU)   Past Surgical History:  Procedure Laterality Date   BACK SURGERY     CARDIAC CATHETERIZATION     CORONARY STENT INTERVENTION N/A 10/07/2017   Procedure: CORONARY STENT INTERVENTION;  Surgeon: Iran Ouch, MD;  Location: [facility];  Service: Cardiovascular;  Laterality: N/A;   CORONARY/GRAFT ACUTE MI REVASCULARIZATION N/A 10/07/2017   Procedure: Coronary/Graft Acute MI Revascularization;  Surgeon: Iran Ouch, MD;  Location: [facility];  Service: Cardiovascular;  Laterality: N/A;   LEFT HEART CATH AND CORONARY ANGIOGRAPHY N/A 10/07/2017   Procedure: LEFT HEART CATH AND CORONARY ANGIOGRAPHY;  Surgeon: Iran Ouch, MD;  Location: [facility];  Service: Cardiovascular;  Laterality: N/A;   LEG AMPUTATION     Right Below knee   STOMACH SURGERY     Billroth II gastrojejunostomy   TOTAL KNEE ARTHROPLASTY     Current Outpatient Medications on File Prior to Visit  Medication Sig Dispense Refill   ALPRAZolam (XANAX) 0.5 MG tablet Take 1 tablet (0.5 mg total) by mouth at bedtime as needed. for sleep 30 tablet 3   aspirin 81 MG tablet Take 81 mg by mouth daily.     budesonide (ENTOCORT EC)  3 MG 24 hr capsule TAKE 2 CAPSULES (6 MG TOTAL) BY MOUTH DAILY. 180 capsule 3   carvedilol (COREG) 3.125 MG tablet TAKE 1 TABLET BY MOUTH TWICE A DAY WITH FOOD 180 tablet 3   cholestyramine (QUESTRAN) 4 GM/DOSE powder Take 1 packet (4 g total) by mouth daily as needed (for colitis flares (mix and drink)). 348.6 g 1   fluorouracil (EFUDEX) 5 % cream Apply 1 application topically daily as needed (as directed- affected sites).     losartan (COZAAR) 25 MG tablet Take 1 tablet (25 mg total) by mouth daily. 90 tablet 3   mometasone (ELOCON) 0.1 % cream Apply topically daily. 45 g 1   Multiple Vitamins-Minerals (PRESERVISION AREDS PO) Take 1 capsule by mouth 2 (two) times daily.     nitroGLYCERIN (NITROSTAT) 0.4 MG SL tablet PLACE 1 TABLET UNDER THE TONGUE EVERY 5 MINUTES FOR 3 DOSES AS NEEDED FOR CHEST PAIN. 25 tablet 3   Omega-3 Fatty Acids ([employer] FISH OIL) 1200 MG CAPS Take 1,200 mg by mouth daily with breakfast.     predniSONE (DELTASONE) 20 MG tablet 3 tabs poqday 1-2, 2 tabs poqday 3-4, 1 tab poqday 5-6 12 tablet 0   rosuvastatin (CRESTOR) 20 MG tablet Take 1 tablet (20 mg total) by mouth daily. 90 tablet 3   sulfamethoxazole-trimethoprim (BACTRIM DS) 800-160 MG tablet Take 1 tablet by mouth 2 (two) times daily. 14 tablet 0   triamcinolone cream (KENALOG) 0.1 % APPLY 1 APPLICATION. TOPICALLY 2 (TWO) TIMES DAILY. 60 g 0   No current facility-administered medications on file prior to visit.   Allergies  Allergen Reactions   Aleve [Naproxen Sodium] Nausea And Vomiting and Other (See Comments)    No problem with other NSAIDs   Amoxicillin Other (See Comments)    Vertigo Did it involve swelling of the face/tongue/throat, SOB, or low BP? No Did it involve sudden or severe rash/hives, skin peeling, or any reaction on the inside of your mouth or nose? No Did you need to seek medical attention at a hospital or doctor's office? unknown When did it last happen?   yrs ago    dizziness If all above  answers are "NO", may proceed with cephalosporin use.    Gabapentin Other (See Comments)    Might be sensitive to this- took 100 mg twice within a couple of hours = Ended up falling from a standing position and had to be taken to the E.D.   Morphine And Related Other (See Comments)    Dizziness    Penicillins Diarrhea and Other (See Comments)    Diarrhea Did it involve swelling of the face/tongue/throat, SOB, or low BP? No Did it involve sudden or severe rash/hives, skin peeling, or any reaction on the inside  of your mouth or nose? No Did you need to seek medical attention at a hospital or doctor's office? Unknown When did it last happen?    years ago   If all above answers are "NO", may proceed with cephalosporin use.    Social History   Socioeconomic History   Marital status: Married    Spouse name: Sport and exercise psychologist   Number of children: Not on file   Years of education: 12   Highest education level: High school graduate  Occupational History   Occupation: Retired  Tobacco Use   Smoking status: Former    Packs/day: 1.00    Years: 8.00    Additional pack years: 0.00    Total pack years: 8.00    Types: Cigarettes   Smokeless tobacco: Never  Vaping Use   Vaping Use: Never used  Substance and Sexual Activity   Alcohol use: Yes    Comment: occasional   Drug use: No   Sexual activity: Yes    Comment: married  Other Topics Concern   Not on file  Social History Narrative   Not on file   Social Determinants of Health   Financial Resource Strain: Low Risk  (09/05/2021)   Overall Financial Resource Strain (CARDIA)    Difficulty of Paying Living Expenses: Not hard at all  Food Insecurity: No Food Insecurity (09/05/2021)   Hunger Vital Sign    Worried About Running Out of Food in the Last Year: Never true    Ran Out of Food in the Last Year: Never true  Transportation Needs: No Transportation Needs (09/05/2021)   PRAPARE - Administrator, Civil Service (Medical): No     Lack of Transportation (Non-Medical): No  Physical Activity: Sufficiently Active (09/05/2021)   Exercise Vital Sign    Days of Exercise per Week: 5 days    Minutes of Exercise per Session: 30 min  Stress: No Stress Concern Present (09/05/2021)   [employer] of Occupational Health - Occupational Stress Questionnaire    Feeling of Stress : Not at all  Social Connections: Socially Integrated (09/05/2021)   Social Connection and Isolation Panel [NHANES]    Frequency of Communication with Friends and Family: More than three times a week    Frequency of Social Gatherings with Friends and Family: More than three times a week    Attends Religious Services: More than 4 times per year    Active Member of [employer] or Organizations: Yes    Attends Banker Meetings: More than 4 times per year    Marital Status: Married  Catering manager Violence: Not At Risk (09/05/2021)   Humiliation, Afraid, Rape, and Kick questionnaire    Fear of Current or Ex-Partner: No    Emotionally Abused: No    Physically Abused: No    Sexually Abused: No   Review of Systems  All other systems reviewed and are negative.      Objective:   Physical Exam Vitals reviewed.  Cardiovascular:     Rate and Rhythm: Normal rate and regular rhythm.     Heart sounds: Normal heart sounds.  Pulmonary:     Effort: Pulmonary effort is normal. No respiratory distress.     Breath sounds: Normal breath sounds. No wheezing or rales.  Musculoskeletal:     Right hip: No tenderness or bony tenderness. Normal range of motion.     Left hip: No tenderness or bony tenderness. Normal range of motion.     Left lower leg: Deformity  and laceration present. No tenderness or bony tenderness.       Legs:        Assessment & Plan:  Skin tear of left lower leg without complication, subsequent encounter I removed his dressing and cleaned the wound thoroughly with peroxide.  I then replaced petroleum gauze over the wound bed  covered with Neosporin.  I then cover that with nonadherent gauze and then wrapped it with 4 x 4 gauze and Coban.  Leave in place for 48 hours.  I gave him dressing material to repeat the dressing change at home on Saturday.  See the patient back on Monday.  Once the bleeding has stopped, I plan to switch to Tegaderm as a covering and replace these every 72 hours until healed.  Anticipate 2 to 3 weeks to heal.

## 2022-07-21 ENCOUNTER — Encounter: Payer: Self-pay | Admitting: Family Medicine

## 2022-07-21 ENCOUNTER — Ambulatory Visit (INDEPENDENT_AMBULATORY_CARE_PROVIDER_SITE_OTHER): Payer: PPO | Admitting: Family Medicine

## 2022-07-21 VITALS — BP 124/72 | HR 62 | Temp 97.7°F | Ht 69.5 in | Wt 141.0 lb

## 2022-07-21 DIAGNOSIS — S81812D Laceration without foreign body, left lower leg, subsequent encounter: Secondary | ICD-10-CM | POA: Diagnosis not present

## 2022-07-21 NOTE — Progress Notes (Signed)
Subjective:    Patient ID: Eugene [name], male    DOB: 02-Jul-1931, 89 y.o.   MRN: [id number] 07/14/21 Yesterday, the patient was cutting branches in his yard.  A branch fell on his left leg ripping open the skin over his left shin.  He suffered a large triangular-shaped skin tear.  He put gauze on it to stop the bleeding.  However it still bleeding through the gauze.  Today on examination, the patient has a large skin tear.  It is roughly 3-1/2 inches x 3-1/2 inches as diagrammed below.  The skin is very thin and likely avascularized.  It would not hold stitches.  He is oozing blood from the underlying subcutaneous tissue.  At that time, my plan was:  I soaked the gauze on his left leg with hydrogen peroxide to be able to remove it from the skin tear without tearing the tissue further.  However he was experiencing bleeding from the underlying subcutaneous tissue and exposed fascia.  The torn piece of skin is too thin to hold stitches.  Therefore I put several pieces of petroleum gauze directly over the wound to cover it completely.  I covered the gauze with Silvadene and then put 2 pieces of nonadherent gauze directly over top of that.  I then applied regular 4 x 4's directly over the nonadherent gauze and wrapped it firmly with Coban to create a pressure dressing.  Leave dressing in place for 24 to 48 hours.  Recheck here on Thursday.  Once we have achieved hemostasis we will debride avascularized tissue and likely cover the wound with Tegaderm to facilitate healing.  Keep clean and dry for the next 48 hours  07/17/22 2 days ago, the patient was bleeding substantially from the entire wound bed.  Today after I removed all the gauze, he was bleeding only from the top 10% of the wound.  However I still do not believe he is ready quite to switch to Tegaderm as I do not feel that these allow sufficient pressure to achieve hemostasis given that he is on an aspirin.  There is no evidence of cellulitis.  He denies  any pain.  At that time, my plan was: I removed his dressing and cleaned the wound thoroughly with peroxide.  I then replaced petroleum gauze over the wound bed covered with Neosporin.  I then cover that with nonadherent gauze and then wrapped it with 4 x 4 gauze and Coban.  Leave in place for 48 hours.  I gave him dressing material to repeat the dressing change at home on Saturday.  See the patient back on Monday.  Once the bleeding has stopped, I plan to switch to Tegaderm as a covering and replace these every 72 hours until healed.  Anticipate 2 to 3 weeks to heal.  07/21/22 Wound looks much better today.  There is very little bleeding.  There is no evidence of cellulitis.  The small triangular piece of avascular tissue has essentially dissolved.  However the wound bed appears healthy with good granulation tissue Past Medical History:  Diagnosis Date   Amputated right leg (HCC)    CAD (coronary artery disease)    a. 09/2017 - acute MI -  emergent cath showing 99% stenosis in midLAD with thrombotic stenosis at the bifurcation of the second diagonal, which was relatively small in diameter. He underwent placement of DES to midLAD which jailed the second diagonal.    Chronic combined systolic and diastolic heart failure (HCC)  Collagenous colitis    Dr. Darlyn Read   History of ST elevation myocardial infarction (STEMI)    09/2017: LAD   History of stomach ulcers    Hyperlipidemia LDL goal <70    Ischemic cardiomyopathy    Squamous cell carcinoma of skin 03/27/2014   in situ-left temple (txpbx)   Squamous cell carcinoma of skin 02/24/2018   in situ-left cheek (CX35FU)   Past Surgical History:  Procedure Laterality Date   BACK SURGERY     CARDIAC CATHETERIZATION     CORONARY STENT INTERVENTION N/A 10/07/2017   Procedure: CORONARY STENT INTERVENTION;  Surgeon: Iran Ouch, MD;  Location: [facility];  Service: Cardiovascular;  Laterality: N/A;   CORONARY/GRAFT ACUTE MI  REVASCULARIZATION N/A 10/07/2017   Procedure: Coronary/Graft Acute MI Revascularization;  Surgeon: Iran Ouch, MD;  Location: [facility];  Service: Cardiovascular;  Laterality: N/A;   LEFT HEART CATH AND CORONARY ANGIOGRAPHY N/A 10/07/2017   Procedure: LEFT HEART CATH AND CORONARY ANGIOGRAPHY;  Surgeon: Iran Ouch, MD;  Location: [facility];  Service: Cardiovascular;  Laterality: N/A;   LEG AMPUTATION     Right Below knee   STOMACH SURGERY     Billroth II gastrojejunostomy   TOTAL KNEE ARTHROPLASTY     Current Outpatient Medications on File Prior to Visit  Medication Sig Dispense Refill   ALPRAZolam (XANAX) 0.5 MG tablet Take 1 tablet (0.5 mg total) by mouth at bedtime as needed. for sleep 30 tablet 3   aspirin 81 MG tablet Take 81 mg by mouth daily.     budesonide (ENTOCORT EC) 3 MG 24 hr capsule TAKE 2 CAPSULES (6 MG TOTAL) BY MOUTH DAILY. 180 capsule 3   carvedilol (COREG) 3.125 MG tablet TAKE 1 TABLET BY MOUTH TWICE A DAY WITH FOOD 180 tablet 3   cholestyramine (QUESTRAN) 4 GM/DOSE powder Take 1 packet (4 g total) by mouth daily as needed (for colitis flares (mix and drink)). 348.6 g 1   fluorouracil (EFUDEX) 5 % cream Apply 1 application topically daily as needed (as directed- affected sites).     losartan (COZAAR) 25 MG tablet Take 1 tablet (25 mg total) by mouth daily. 90 tablet 3   mometasone (ELOCON) 0.1 % cream Apply topically daily. 45 g 1   Multiple Vitamins-Minerals (PRESERVISION AREDS PO) Take 1 capsule by mouth 2 (two) times daily.     nitroGLYCERIN (NITROSTAT) 0.4 MG SL tablet PLACE 1 TABLET UNDER THE TONGUE EVERY 5 MINUTES FOR 3 DOSES AS NEEDED FOR CHEST PAIN. 25 tablet 3   Omega-3 Fatty Acids ([employer] FISH OIL) 1200 MG CAPS Take 1,200 mg by mouth daily with breakfast.     predniSONE (DELTASONE) 20 MG tablet 3 tabs poqday 1-2, 2 tabs poqday 3-4, 1 tab poqday 5-6 12 tablet 0   rosuvastatin (CRESTOR) 20 MG tablet Take 1 tablet (20 mg total) by mouth  daily. 90 tablet 3   sulfamethoxazole-trimethoprim (BACTRIM DS) 800-160 MG tablet Take 1 tablet by mouth 2 (two) times daily. 14 tablet 0   triamcinolone cream (KENALOG) 0.1 % APPLY 1 APPLICATION. TOPICALLY 2 (TWO) TIMES DAILY. 60 g 0   No current facility-administered medications on file prior to visit.   Allergies  Allergen Reactions   Aleve [Naproxen Sodium] Nausea And Vomiting and Other (See Comments)    No problem with other NSAIDs   Amoxicillin Other (See Comments)    Vertigo Did it involve swelling of the face/tongue/throat, SOB, or low BP? No  Did it involve sudden or severe rash/hives, skin peeling, or any reaction on the inside of your mouth or nose? No Did you need to seek medical attention at a hospital or doctor's office? unknown When did it last happen?   yrs ago    dizziness If all above answers are "NO", may proceed with cephalosporin use.    Gabapentin Other (See Comments)    Might be sensitive to this- took 100 mg twice within a couple of hours = Ended up falling from a standing position and had to be taken to the E.D.   Morphine And Related Other (See Comments)    Dizziness    Penicillins Diarrhea and Other (See Comments)    Diarrhea Did it involve swelling of the face/tongue/throat, SOB, or low BP? No Did it involve sudden or severe rash/hives, skin peeling, or any reaction on the inside of your mouth or nose? No Did you need to seek medical attention at a hospital or doctor's office? Unknown When did it last happen?    years ago   If all above answers are "NO", may proceed with cephalosporin use.    Social History   Socioeconomic History   Marital status: Married    Spouse name: Sport and exercise psychologist   Number of children: Not on file   Years of education: 12   Highest education level: High school graduate  Occupational History   Occupation: Retired  Tobacco Use   Smoking status: Former    Packs/day: 1.00    Years: 8.00    Additional pack years: 0.00    Total pack  years: 8.00    Types: Cigarettes   Smokeless tobacco: Never  Vaping Use   Vaping Use: Never used  Substance and Sexual Activity   Alcohol use: Yes    Comment: occasional   Drug use: No   Sexual activity: Yes    Comment: married  Other Topics Concern   Not on file  Social History Narrative   Not on file   Social Determinants of Health   Financial Resource Strain: Low Risk  (09/05/2021)   Overall Financial Resource Strain (CARDIA)    Difficulty of Paying Living Expenses: Not hard at all  Food Insecurity: No Food Insecurity (09/05/2021)   Hunger Vital Sign    Worried About Running Out of Food in the Last Year: Never true    Ran Out of Food in the Last Year: Never true  Transportation Needs: No Transportation Needs (09/05/2021)   PRAPARE - Administrator, Civil Service (Medical): No    Lack of Transportation (Non-Medical): No  Physical Activity: Sufficiently Active (09/05/2021)   Exercise Vital Sign    Days of Exercise per Week: 5 days    Minutes of Exercise per Session: 30 min  Stress: No Stress Concern Present (09/05/2021)   [employer] of Occupational Health - Occupational Stress Questionnaire    Feeling of Stress : Not at all  Social Connections: Socially Integrated (09/05/2021)   Social Connection and Isolation Panel [NHANES]    Frequency of Communication with Friends and Family: More than three times a week    Frequency of Social Gatherings with Friends and Family: More than three times a week    Attends Religious Services: More than 4 times per year    Active Member of [employer] or Organizations: Yes    Attends Engineer, structural: More than 4 times per year    Marital Status: Married  Catering manager Violence: Not At Risk (  09/05/2021)   Humiliation, Afraid, Rape, and Kick questionnaire    Fear of Current or Ex-Partner: No    Emotionally Abused: No    Physically Abused: No    Sexually Abused: No   Review of Systems  All other systems reviewed  and are negative.      Objective:   Physical Exam Vitals reviewed.  Cardiovascular:     Rate and Rhythm: Normal rate and regular rhythm.     Heart sounds: Normal heart sounds.  Pulmonary:     Effort: Pulmonary effort is normal. No respiratory distress.     Breath sounds: Normal breath sounds. No wheezing or rales.  Musculoskeletal:     Right hip: No tenderness or bony tenderness. Normal range of motion.     Left hip: No tenderness or bony tenderness. Normal range of motion.     Left lower leg: Deformity and laceration present. No tenderness or bony tenderness.       Legs:        Assessment & Plan:  Skin tear of left lower leg without complication, subsequent encounter I placed a Tegaderm over the wound today.  I did put nonadherent gauze over top of the Tegaderm and wrapped it with Coban to prevent the Tegaderm from coming off of wound.  I recommended changing the Tegaderm every 2 to 3 days.  Patient and his wife prefer that I perform the dressing changes so they will come in on Thursday or sooner if worsening

## 2022-07-24 ENCOUNTER — Encounter: Payer: Self-pay | Admitting: Family Medicine

## 2022-07-24 ENCOUNTER — Ambulatory Visit (INDEPENDENT_AMBULATORY_CARE_PROVIDER_SITE_OTHER): Payer: PPO | Admitting: Family Medicine

## 2022-07-24 VITALS — BP 120/80 | HR 86 | Temp 97.8°F | Resp 16

## 2022-07-24 DIAGNOSIS — S81812D Laceration without foreign body, left lower leg, subsequent encounter: Secondary | ICD-10-CM | POA: Diagnosis not present

## 2022-07-24 NOTE — Progress Notes (Signed)
Subjective:    Patient ID: Eugene [name], male    DOB: [birth year], 89 y.o.   MRN: [id number] 07/14/21 Yesterday, the patient was cutting branches in his yard.  A branch fell on his left leg ripping open the skin over his left shin.  He suffered a large triangular-shaped skin tear.  He put gauze on it to stop the bleeding.  However it still bleeding through the gauze.  Today on examination, the patient has a large skin tear.  It is roughly 3-1/2 inches x 3-1/2 inches as diagrammed below.  The skin is very thin and likely avascularized.  It would not hold stitches.  He is oozing blood from the underlying subcutaneous tissue.  At that time, my plan was:  I soaked the gauze on his left leg with hydrogen peroxide to be able to remove it from the skin tear without tearing the tissue further.  However he was experiencing bleeding from the underlying subcutaneous tissue and exposed fascia.  The torn piece of skin is too thin to hold stitches.  Therefore I put several pieces of petroleum gauze directly over the wound to cover it completely.  I covered the gauze with Silvadene and then put 2 pieces of nonadherent gauze directly over top of that.  I then applied regular 4 x 4's directly over the nonadherent gauze and wrapped it firmly with Coban to create a pressure dressing.  Leave dressing in place for 24 to 48 hours.  Recheck here on Thursday.  Once we have achieved hemostasis we will debride avascularized tissue and likely cover the wound with Tegaderm to facilitate healing.  Keep clean and dry for the next 48 hours  07/17/22 2 days ago, the patient was bleeding substantially from the entire wound bed.  Today after I removed all the gauze, he was bleeding only from the top 10% of the wound.  However I still do not believe he is ready quite to switch to Tegaderm as I do not feel that these allow sufficient pressure to achieve hemostasis given that he is on an aspirin.  There is no evidence of cellulitis.  He denies  any pain.  At that time, my plan was: I removed his dressing and cleaned the wound thoroughly with peroxide.  I then replaced petroleum gauze over the wound bed covered with Neosporin.  I then cover that with nonadherent gauze and then wrapped it with 4 x 4 gauze and Coban.  Leave in place for 48 hours.  I gave him dressing material to repeat the dressing change at home on Saturday.  See the patient back on Monday.  Once the bleeding has stopped, I plan to switch to Tegaderm as a covering and replace these every 72 hours until healed.  Anticipate 2 to 3 weeks to heal.  07/21/22 Wound looks much better today.  There is very little bleeding.  There is no evidence of cellulitis.  The small triangular piece of avascular tissue has essentially dissolved.  However the wound bed appears healthy with good granulation tissue.  At that time, my plan was: I placed a Tegaderm over the wound today.  I did put nonadherent gauze over top of the Tegaderm and wrapped it with Coban to prevent the Tegaderm from coming off of wound.  I recommended changing the Tegaderm every 2 to 3 days.  Patient and his wife prefer that I perform the dressing changes so they will come in on Thursday or sooner if worsening  07/24/22  Wound continues to improve.  There is no evidence of cellulitis.  There is healthy granulation tissue at the base.  There is some bleeding where I removed the Tegaderm.  The patient states that the Tegaderm seemed to cause burning pain Past Medical History:  Diagnosis Date   Amputated right leg (HCC)    CAD (coronary artery disease)    a. 09/2017 - acute MI -  emergent cath showing 99% stenosis in midLAD with thrombotic stenosis at the bifurcation of the second diagonal, which was relatively small in diameter. He underwent placement of DES to midLAD which jailed the second diagonal.    Chronic combined systolic and diastolic heart failure (HCC)    Collagenous colitis    Dr. Darlyn Read   History of ST  elevation myocardial infarction (STEMI)    09/2017: LAD   History of stomach ulcers    Hyperlipidemia LDL goal <70    Ischemic cardiomyopathy    Squamous cell carcinoma of skin 03/27/2014   in situ-left temple (txpbx)   Squamous cell carcinoma of skin 02/24/2018   in situ-left cheek (CX35FU)   Past Surgical History:  Procedure Laterality Date   BACK SURGERY     CARDIAC CATHETERIZATION     CORONARY STENT INTERVENTION N/A 10/07/2017   Procedure: CORONARY STENT INTERVENTION;  Surgeon: Iran Ouch, MD;  Location: [facility];  Service: Cardiovascular;  Laterality: N/A;   CORONARY/GRAFT ACUTE MI REVASCULARIZATION N/A 10/07/2017   Procedure: Coronary/Graft Acute MI Revascularization;  Surgeon: Iran Ouch, MD;  Location: [facility];  Service: Cardiovascular;  Laterality: N/A;   LEFT HEART CATH AND CORONARY ANGIOGRAPHY N/A 10/07/2017   Procedure: LEFT HEART CATH AND CORONARY ANGIOGRAPHY;  Surgeon: Iran Ouch, MD;  Location: [facility];  Service: Cardiovascular;  Laterality: N/A;   LEG AMPUTATION     Right Below knee   STOMACH SURGERY     Billroth II gastrojejunostomy   TOTAL KNEE ARTHROPLASTY     Current Outpatient Medications on File Prior to Visit  Medication Sig Dispense Refill   ALPRAZolam (XANAX) 0.5 MG tablet Take 1 tablet (0.5 mg total) by mouth at bedtime as needed. for sleep 30 tablet 3   aspirin 81 MG tablet Take 81 mg by mouth daily.     budesonide (ENTOCORT EC) 3 MG 24 hr capsule TAKE 2 CAPSULES (6 MG TOTAL) BY MOUTH DAILY. 180 capsule 3   carvedilol (COREG) 3.125 MG tablet TAKE 1 TABLET BY MOUTH TWICE A DAY WITH FOOD 180 tablet 3   cholestyramine (QUESTRAN) 4 GM/DOSE powder Take 1 packet (4 g total) by mouth daily as needed (for colitis flares (mix and drink)). 348.6 g 1   fluorouracil (EFUDEX) 5 % cream Apply 1 application topically daily as needed (as directed- affected sites).     losartan (COZAAR) 25 MG tablet Take 1 tablet (25 mg  total) by mouth daily. 90 tablet 3   mometasone (ELOCON) 0.1 % cream Apply topically daily. 45 g 1   Multiple Vitamins-Minerals (PRESERVISION AREDS PO) Take 1 capsule by mouth 2 (two) times daily.     nitroGLYCERIN (NITROSTAT) 0.4 MG SL tablet PLACE 1 TABLET UNDER THE TONGUE EVERY 5 MINUTES FOR 3 DOSES AS NEEDED FOR CHEST PAIN. 25 tablet 3   Omega-3 Fatty Acids ([employer] FISH OIL) 1200 MG CAPS Take 1,200 mg by mouth daily with breakfast.     predniSONE (DELTASONE) 20 MG tablet 3 tabs poqday 1-2, 2 tabs poqday 3-4, 1 tab poqday  5-6 12 tablet 0   rosuvastatin (CRESTOR) 20 MG tablet Take 1 tablet (20 mg total) by mouth daily. 90 tablet 3   sulfamethoxazole-trimethoprim (BACTRIM DS) 800-160 MG tablet Take 1 tablet by mouth 2 (two) times daily. 14 tablet 0   triamcinolone cream (KENALOG) 0.1 % APPLY 1 APPLICATION. TOPICALLY 2 (TWO) TIMES DAILY. 60 g 0   No current facility-administered medications on file prior to visit.   Allergies  Allergen Reactions   Aleve [Naproxen Sodium] Nausea And Vomiting and Other (See Comments)    No problem with other NSAIDs   Amoxicillin Other (See Comments)    Vertigo Did it involve swelling of the face/tongue/throat, SOB, or low BP? No Did it involve sudden or severe rash/hives, skin peeling, or any reaction on the inside of your mouth or nose? No Did you need to seek medical attention at a hospital or doctor's office? unknown When did it last happen?   yrs ago    dizziness If all above answers are "NO", may proceed with cephalosporin use.    Gabapentin Other (See Comments)    Might be sensitive to this- took 100 mg twice within a couple of hours = Ended up falling from a standing position and had to be taken to the E.D.   Morphine And Related Other (See Comments)    Dizziness    Penicillins Diarrhea and Other (See Comments)    Diarrhea Did it involve swelling of the face/tongue/throat, SOB, or low BP? No Did it involve sudden or severe rash/hives, skin peeling,  or any reaction on the inside of your mouth or nose? No Did you need to seek medical attention at a hospital or doctor's office? Unknown When did it last happen?    years ago   If all above answers are "NO", may proceed with cephalosporin use.    Social History   Socioeconomic History   Marital status: Married    Spouse name: Sport and exercise psychologist   Number of children: Not on file   Years of education: 12   Highest education level: High school graduate  Occupational History   Occupation: Retired  Tobacco Use   Smoking status: Former    Packs/day: 1.00    Years: 8.00    Additional pack years: 0.00    Total pack years: 8.00    Types: Cigarettes   Smokeless tobacco: Never  Vaping Use   Vaping Use: Never used  Substance and Sexual Activity   Alcohol use: Yes    Comment: occasional   Drug use: No   Sexual activity: Yes    Comment: married  Other Topics Concern   Not on file  Social History Narrative   Not on file   Social Determinants of Health   Financial Resource Strain: Low Risk  (09/05/2021)   Overall Financial Resource Strain (CARDIA)    Difficulty of Paying Living Expenses: Not hard at all  Food Insecurity: No Food Insecurity (09/05/2021)   Hunger Vital Sign    Worried About Running Out of Food in the Last Year: Never true    Ran Out of Food in the Last Year: Never true  Transportation Needs: No Transportation Needs (09/05/2021)   PRAPARE - Administrator, Civil Service (Medical): No    Lack of Transportation (Non-Medical): No  Physical Activity: Sufficiently Active (09/05/2021)   Exercise Vital Sign    Days of Exercise per Week: 5 days    Minutes of Exercise per Session: 30 min  Stress: No Stress  Concern Present (09/05/2021)   [employer] of Occupational Health - Occupational Stress Questionnaire    Feeling of Stress : Not at all  Social Connections: Socially Integrated (09/05/2021)   Social Connection and Isolation Panel [NHANES]    Frequency of Communication  with Friends and Family: More than three times a week    Frequency of Social Gatherings with Friends and Family: More than three times a week    Attends Religious Services: More than 4 times per year    Active Member of [employer] or Organizations: Yes    Attends Engineer, structural: More than 4 times per year    Marital Status: Married  Catering manager Violence: Not At Risk (09/05/2021)   Humiliation, Afraid, Rape, and Kick questionnaire    Fear of Current or Ex-Partner: No    Emotionally Abused: No    Physically Abused: No    Sexually Abused: No   Review of Systems  All other systems reviewed and are negative.      Objective:   Physical Exam Vitals reviewed.  Cardiovascular:     Rate and Rhythm: Normal rate and regular rhythm.     Heart sounds: Normal heart sounds.  Pulmonary:     Effort: Pulmonary effort is normal. No respiratory distress.     Breath sounds: Normal breath sounds. No wheezing or rales.  Musculoskeletal:     Right hip: No tenderness or bony tenderness. Normal range of motion.     Left hip: No tenderness or bony tenderness. Normal range of motion.     Left lower leg: Deformity and laceration present. No tenderness or bony tenderness.       Legs:        Assessment & Plan:  Skin tear of left lower leg without complication, subsequent encounter It is healing well.  I recommended putting antibiotic salve such as Polysporin on a nonadherent gauze, covering the entire wound with nonadherent gauze and then wrapping with Coban to keep the wound clean and covered.  Replace dressing daily until healed.  Recheck next week

## 2022-08-13 DIAGNOSIS — D485 Neoplasm of uncertain behavior of skin: Secondary | ICD-10-CM | POA: Diagnosis not present

## 2022-08-13 DIAGNOSIS — D0461 Carcinoma in situ of skin of right upper limb, including shoulder: Secondary | ICD-10-CM | POA: Diagnosis not present

## 2022-08-13 DIAGNOSIS — C44622 Squamous cell carcinoma of skin of right upper limb, including shoulder: Secondary | ICD-10-CM | POA: Diagnosis not present

## 2022-08-26 DIAGNOSIS — C44229 Squamous cell carcinoma of skin of left ear and external auricular canal: Secondary | ICD-10-CM | POA: Diagnosis not present

## 2022-08-26 DIAGNOSIS — D485 Neoplasm of uncertain behavior of skin: Secondary | ICD-10-CM | POA: Diagnosis not present

## 2022-08-26 DIAGNOSIS — D0462 Carcinoma in situ of skin of left upper limb, including shoulder: Secondary | ICD-10-CM | POA: Diagnosis not present

## 2022-09-01 DIAGNOSIS — Z48817 Encounter for surgical aftercare following surgery on the skin and subcutaneous tissue: Secondary | ICD-10-CM | POA: Diagnosis not present

## 2022-09-01 DIAGNOSIS — Z4801 Encounter for change or removal of surgical wound dressing: Secondary | ICD-10-CM | POA: Diagnosis not present

## 2022-09-09 IMAGING — MR MR HEAD W/O CM
10 of 11 series · 43 of 48 positions shown · non-contrast
Comparison: Prior CTs from 04/05/2021.

CLINICAL DATA: Initial evaluation for acute stroke.

EXAM:
MRI HEAD WITHOUT CONTRAST
TECHNIQUE: Multiplanar, multiecho pulse sequences of the brain and surrounding
structures were obtained without intravenous contrast.

[Series 5: DWI · axial · 3.0mm · 0.88mm/px · z∈[-116,+29]mm · 10 of 100 slices shown (1 of 4)]
[im 1/100]
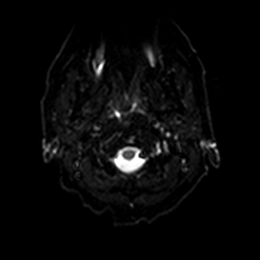
[im 12/100]
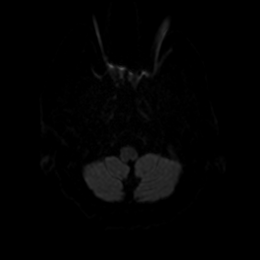
[im 23/100]
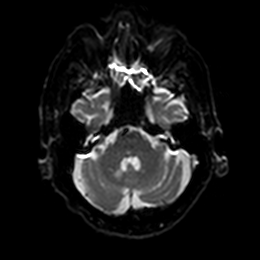
[im 34/100]
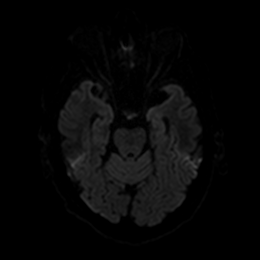
[im 45/100]
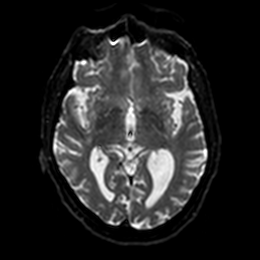
[im 56/100]
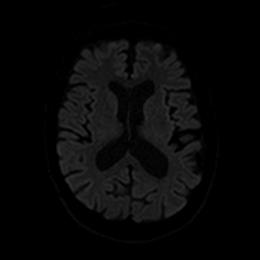
[im 67/100]
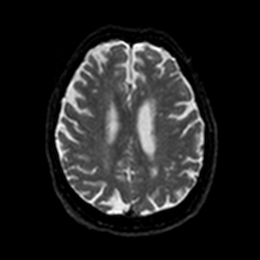
[im 78/100]
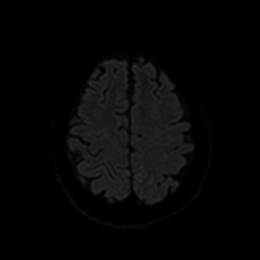
[im 89/100]
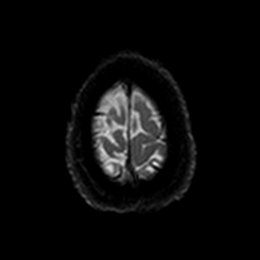
[im 100/100]
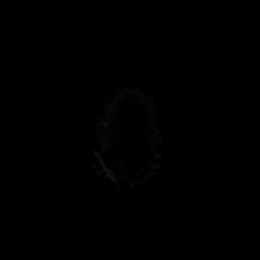

[Series 6: DWI · axial · 3.0mm · 0.88mm/px · z∈[-116,+29]mm · 5 of 50 slices shown (2 of 4)]
[im 1/50]
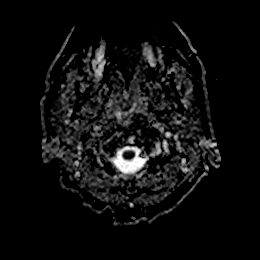
[im 13/50]
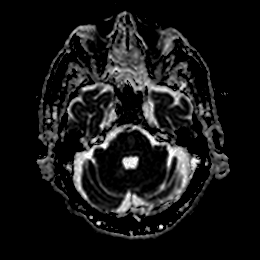
[im 25/50]
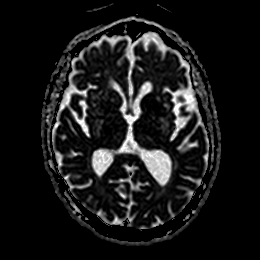
[im 37/50]
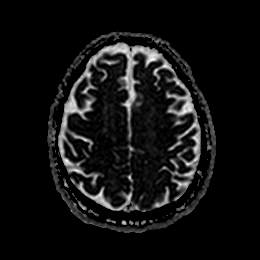
[im 50/50]
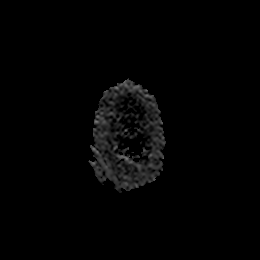

[Series 7: DWI · coronal · 4.0mm · 0.88mm/px · 6 of 66 slices shown (3 of 4)]
[im 1/66]
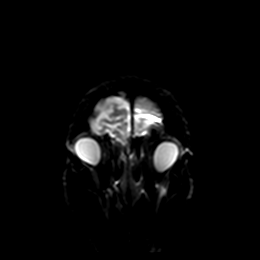
[im 14/66]
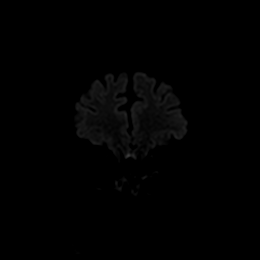
[im 27/66]
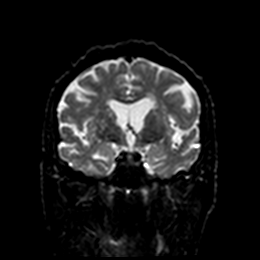
[im 40/66]
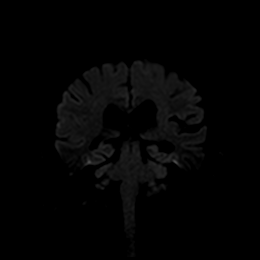
[im 53/66]
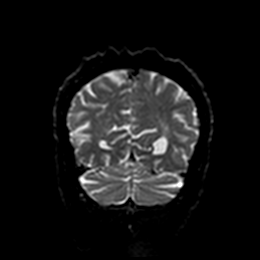
[im 66/66]
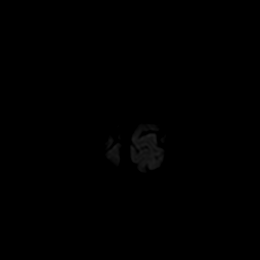

[Series 8: DWI · coronal · 4.0mm · 0.88mm/px · 3 of 33 slices shown (4 of 4)]
[im 1/33]
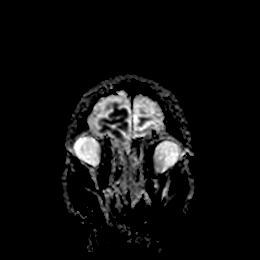
[im 17/33]
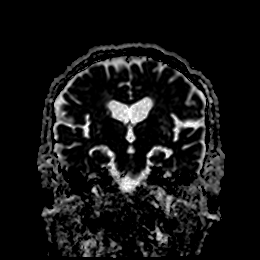
[im 33/33]
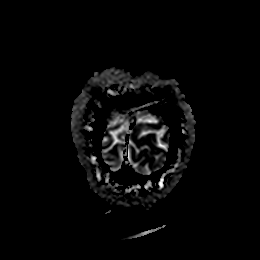

[Series 9: T1 · sagittal · 5.0mm · 0.78mm/px · 2 of 23 slices shown]
[im 1/23]
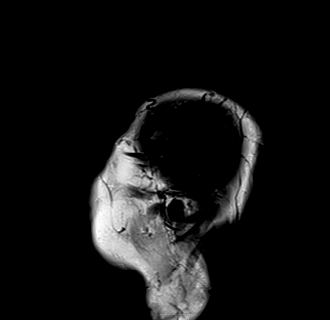
[im 23/23]
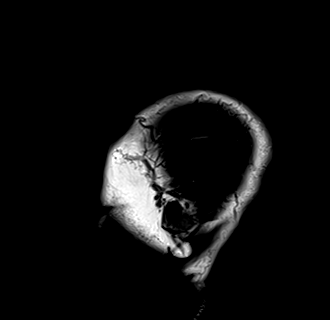

[Series 10: T2 · axial · 5.0mm · 0.72mm/px · z∈[-115,+28]mm · 2 of 25 slices shown (1 of 2)]
[im 1/25]
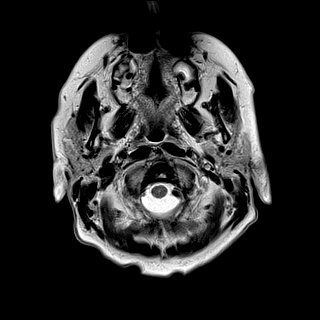
[im 25/25]
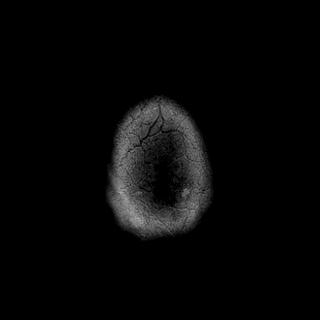

[Series 11: FLAIR · axial · 5.0mm · 0.45mm/px · z∈[-112,+31]mm · 2 of 25 slices shown]
[im 1/25]
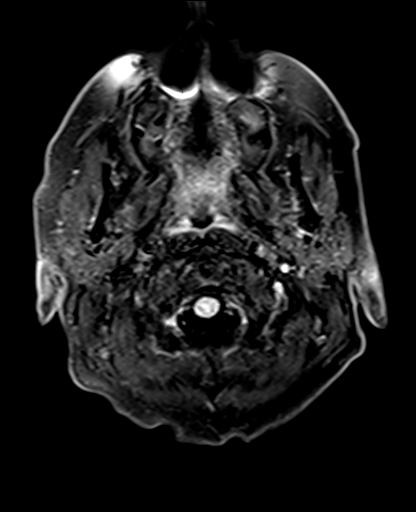
[im 25/25]
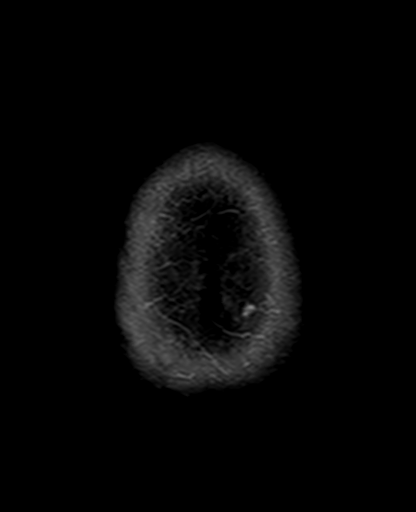

[Series 13: pha_images · axial · 3.0mm · 0.90mm/px · z∈[-128,+47]mm · 5 of 58 slices shown]
[im 1/58]
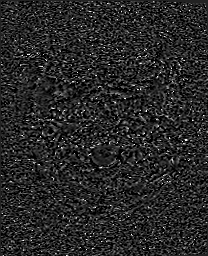
[im 15/58]
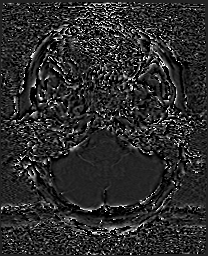
[im 29/58]
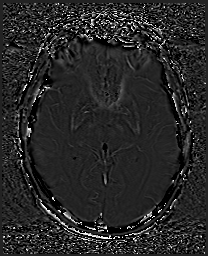
[im 43/58]
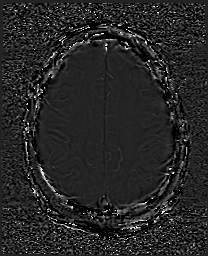
[im 58/58]
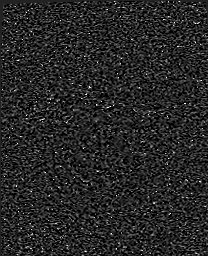

[Series 14: swi_images · axial · 3.0mm · 0.90mm/px · z∈[-128,+47]mm · 5 of 60 slices shown]
[im 1/60]
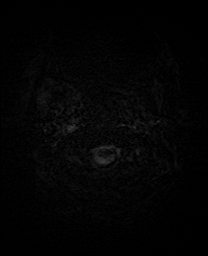
[im 15/60]
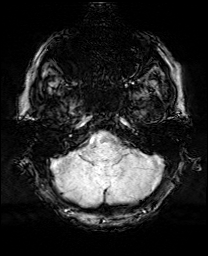
[im 30/60]
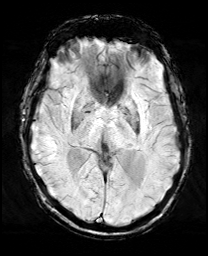
[im 45/60]
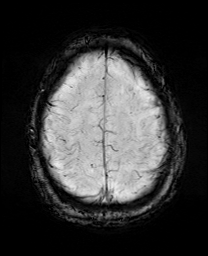
[im 60/60]
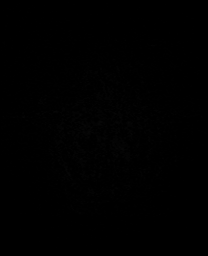

[Series 17: T2 · coronal · 5.0mm · 0.34mm/px · 3 of 29 slices shown (2 of 2)]
[im 1/29]
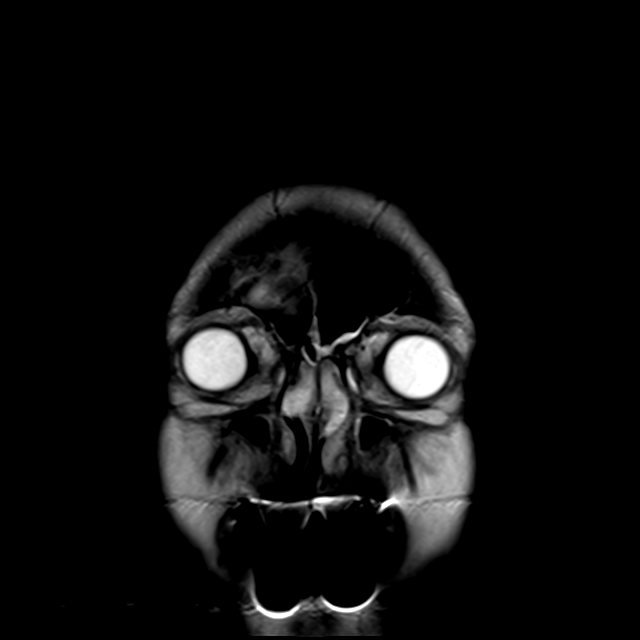
[im 15/29]
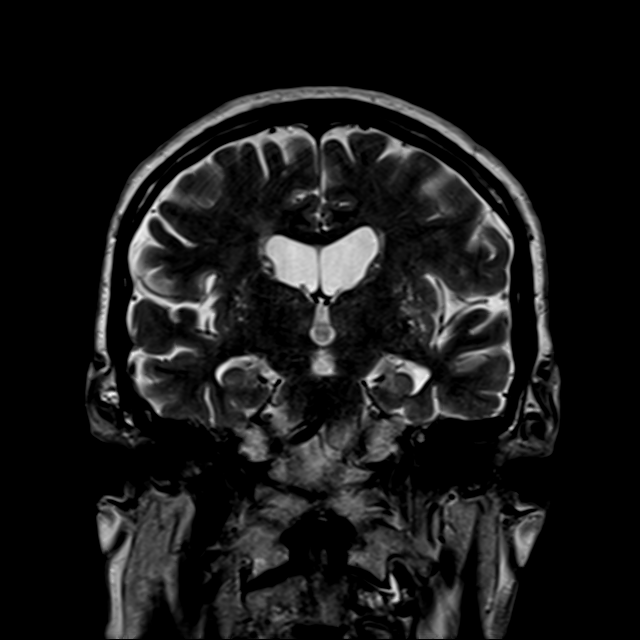
[im 29/29]
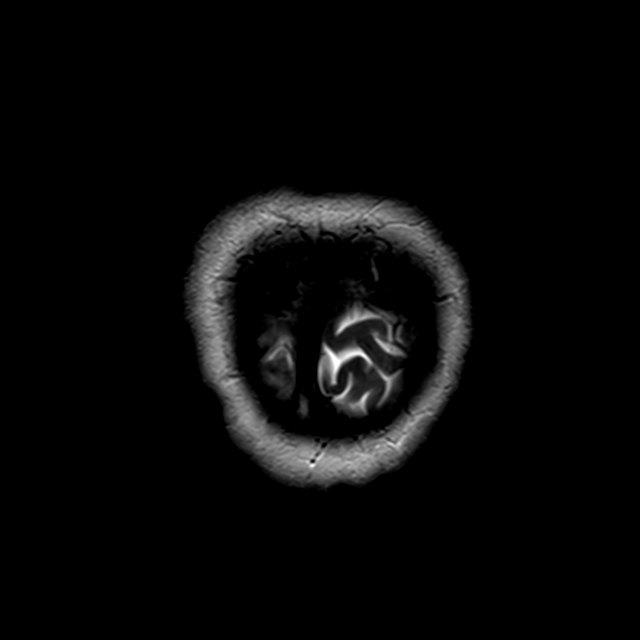

[43 of 48 positions shown; findings below may reference images not displayed]

FINDINGS: Brain: Cerebral volume within normal limits. Mild chronic
microvascular ischemic disease for age. Small remote right
cerebellar infarct noted.

No evidence for acute or subacute ischemia. Gray-white matter
differentiation maintained. No other areas of remote cortical
infarction. No acute intracranial hemorrhage. Single chronic
microhemorrhage noted within the right frontal corona radiata, of
doubtful significance in isolation.

No mass lesion, midline shift or mass effect. No hydrocephalus or
extra-axial fluid collection. Pituitary gland suprasellar region
normal. Midline structures intact.

Vascular: Absent flow void within the right vertebral artery,
consistent with previously identified occlusion. Major intracranial
vascular flow voids are otherwise maintained.

Skull and upper cervical spine: Craniocervical junction within
normal limits. Bone marrow signal intensity normal. No scalp soft
tissue abnormality.

Sinuses/Orbits: Prior bilateral ocular lens replacement. Moderate
mucosal thickening present throughout the ethmoidal air cells and
maxillary sinuses. No mastoid effusion.

Other: None.
IMPRESSION: 1. No acute intracranial abnormality.
2. Underlying mild chronic microvascular ischemic disease for age
with small remote right cerebellar infarct.
3. Absent flow void within the right vertebral artery, consistent
with occlusion as seen on prior CTA.

## 2022-09-26 ENCOUNTER — Encounter: Payer: Self-pay | Admitting: Family Medicine

## 2022-09-26 ENCOUNTER — Ambulatory Visit (INDEPENDENT_AMBULATORY_CARE_PROVIDER_SITE_OTHER): Payer: PPO | Admitting: Family Medicine

## 2022-09-26 VITALS — BP 112/58 | HR 61 | Temp 97.5°F | Ht 69.5 in | Wt 140.0 lb

## 2022-09-26 DIAGNOSIS — R531 Weakness: Secondary | ICD-10-CM

## 2022-09-26 DIAGNOSIS — R269 Unspecified abnormalities of gait and mobility: Secondary | ICD-10-CM

## 2022-09-26 LAB — CBC WITH DIFFERENTIAL/PLATELET
Absolute Monocytes: 952 cells/uL — ABNORMAL HIGH (ref 200–950)
Basophils Absolute: 48 cells/uL (ref 0–200)
Basophils Relative: 0.7 %
Eosinophils Relative: 3.7 %
Lymphs Abs: 2944 cells/uL (ref 850–3900)
MCV: 97 fL (ref 80.0–100.0)
MPV: 10.3 fL (ref 7.5–12.5)
Monocytes Relative: 14 %
Neutrophils Relative %: 38.3 %
RBC: 4.01 10*6/uL — ABNORMAL LOW (ref 4.20–5.80)
RDW: 11.5 % (ref 11.0–15.0)

## 2022-09-26 NOTE — Progress Notes (Signed)
Subjective:    Patient ID: Eugene [name], male    DOB: December 18, 1931, 89 y.o.   MRN: [id number] 06/05/22 Patient has a history of a right BKA.  His wife is concerned he has an unsteady gait.  I watched the patient walk.  He is not shuffling his gait.  He has a normal arm swing.  He denies any falls or injuries.  He has normal neurologic testing.  He has normal Romberg sign.  Cerebellar exam is normal.  However he has weakness in his left hip flexor.  He also has weakness in his left hip abductor.  I think this is causing his unsteady gait.  He is unable to compensate for the right below the knee amputation and prosthesis.  His wife is also concerned because he is more tired recently.  He denies any chest pain or shortness of breath.  His blood pressure is relatively low for age.  He denies any dizziness or lightheadedness Past Medical History:  Diagnosis Date   Amputated right leg (HCC)    CAD (coronary artery disease)    a. 09/2017 - acute MI -  emergent cath showing 99% stenosis in midLAD with thrombotic stenosis at the bifurcation of the second diagonal, which was relatively small in diameter. He underwent placement of DES to midLAD which jailed the second diagonal.    Chronic combined systolic and diastolic heart failure (HCC)    Collagenous colitis    Dr. Darlyn Read   History of ST elevation myocardial infarction (STEMI)    09/2017: LAD   History of stomach ulcers    Hyperlipidemia LDL goal <70    Ischemic cardiomyopathy    Squamous cell carcinoma of skin 03/27/2014   in situ-left temple (txpbx)   Squamous cell carcinoma of skin 02/24/2018   in situ-left cheek (CX35FU)   Past Surgical History:  Procedure Laterality Date   BACK SURGERY     CARDIAC CATHETERIZATION     CORONARY STENT INTERVENTION N/A 10/07/2017   Procedure: CORONARY STENT INTERVENTION;  Surgeon: Iran Ouch, MD;  Location: [facility];  Service: Cardiovascular;  Laterality: N/A;   CORONARY/GRAFT ACUTE MI  REVASCULARIZATION N/A 10/07/2017   Procedure: Coronary/Graft Acute MI Revascularization;  Surgeon: Iran Ouch, MD;  Location: [facility];  Service: Cardiovascular;  Laterality: N/A;   LEFT HEART CATH AND CORONARY ANGIOGRAPHY N/A 10/07/2017   Procedure: LEFT HEART CATH AND CORONARY ANGIOGRAPHY;  Surgeon: Iran Ouch, MD;  Location: [facility];  Service: Cardiovascular;  Laterality: N/A;   LEG AMPUTATION     Right Below knee   STOMACH SURGERY     Billroth II gastrojejunostomy   TOTAL KNEE ARTHROPLASTY     Current Outpatient Medications on File Prior to Visit  Medication Sig Dispense Refill   ALPRAZolam (XANAX) 0.5 MG tablet Take 1 tablet (0.5 mg total) by mouth at bedtime as needed. for sleep 30 tablet 3   aspirin 81 MG tablet Take 81 mg by mouth daily.     budesonide (ENTOCORT EC) 3 MG 24 hr capsule TAKE 2 CAPSULES (6 MG TOTAL) BY MOUTH DAILY. 180 capsule 3   carvedilol (COREG) 3.125 MG tablet TAKE 1 TABLET BY MOUTH TWICE A DAY WITH FOOD 180 tablet 3   cholestyramine (QUESTRAN) 4 GM/DOSE powder Take 1 packet (4 g total) by mouth daily as needed (for colitis flares (mix and drink)). 348.6 g 1   fluorouracil (EFUDEX) 5 % cream Apply 1 application topically daily as  needed (as directed- affected sites).     losartan (COZAAR) 25 MG tablet Take 1 tablet (25 mg total) by mouth daily. 90 tablet 3   mometasone (ELOCON) 0.1 % cream Apply topically daily. 45 g 1   Multiple Vitamins-Minerals (PRESERVISION AREDS PO) Take 1 capsule by mouth 2 (two) times daily.     nitroGLYCERIN (NITROSTAT) 0.4 MG SL tablet PLACE 1 TABLET UNDER THE TONGUE EVERY 5 MINUTES FOR 3 DOSES AS NEEDED FOR CHEST PAIN. 25 tablet 3   Omega-3 Fatty Acids ([employer] FISH OIL) 1200 MG CAPS Take 1,200 mg by mouth daily with breakfast.     rosuvastatin (CRESTOR) 20 MG tablet Take 1 tablet (20 mg total) by mouth daily. 90 tablet 3   triamcinolone cream (KENALOG) 0.1 % APPLY 1 APPLICATION. TOPICALLY 2 (TWO) TIMES DAILY.  60 g 0   No current facility-administered medications on file prior to visit.   Allergies  Allergen Reactions   Aleve [Naproxen Sodium] Nausea And Vomiting and Other (See Comments)    No problem with other NSAIDs   Amoxicillin Other (See Comments)    Vertigo Did it involve swelling of the face/tongue/throat, SOB, or low BP? No Did it involve sudden or severe rash/hives, skin peeling, or any reaction on the inside of your mouth or nose? No Did you need to seek medical attention at a hospital or doctor's office? unknown When did it last happen?   yrs ago    dizziness If all above answers are "NO", may proceed with cephalosporin use.    Gabapentin Other (See Comments)    Might be sensitive to this- took 100 mg twice within a couple of hours = Ended up falling from a standing position and had to be taken to the E.D.   Morphine And Codeine Other (See Comments)    Dizziness    Penicillins Diarrhea and Other (See Comments)    Diarrhea Did it involve swelling of the face/tongue/throat, SOB, or low BP? No Did it involve sudden or severe rash/hives, skin peeling, or any reaction on the inside of your mouth or nose? No Did you need to seek medical attention at a hospital or doctor's office? Unknown When did it last happen?    years ago   If all above answers are "NO", may proceed with cephalosporin use.    Social History   Socioeconomic History   Marital status: Married    Spouse name: Sport and exercise psychologist   Number of children: Not on file   Years of education: 12   Highest education level: High school graduate  Occupational History   Occupation: Retired  Tobacco Use   Smoking status: Former    Current packs/day: 1.00    Average packs/day: 1 pack/day for 8.0 years (8.0 ttl pk-yrs)    Types: Cigarettes   Smokeless tobacco: Never  Vaping Use   Vaping status: Never Used  Substance and Sexual Activity   Alcohol use: Yes    Comment: occasional   Drug use: No   Sexual activity: Yes    Comment:  married  Other Topics Concern   Not on file  Social History Narrative   Not on file   Social Determinants of Health   Financial Resource Strain: Low Risk  (09/05/2021)   Overall Financial Resource Strain (CARDIA)    Difficulty of Paying Living Expenses: Not hard at all  Food Insecurity: No Food Insecurity (09/05/2021)   Hunger Vital Sign    Worried About Running Out of Food in the Last Year:  Never true    Ran Out of Food in the Last Year: Never true  Transportation Needs: No Transportation Needs (09/05/2021)   PRAPARE - Administrator, Civil Service (Medical): No    Lack of Transportation (Non-Medical): No  Physical Activity: Sufficiently Active (09/05/2021)   Exercise Vital Sign    Days of Exercise per Week: 5 days    Minutes of Exercise per Session: 30 min  Stress: No Stress Concern Present (09/05/2021)   [employer] of Occupational Health - Occupational Stress Questionnaire    Feeling of Stress : Not at all  Social Connections: Socially Integrated (09/05/2021)   Social Connection and Isolation Panel [NHANES]    Frequency of Communication with Friends and Family: More than three times a week    Frequency of Social Gatherings with Friends and Family: More than three times a week    Attends Religious Services: More than 4 times per year    Active Member of [employer] or Organizations: Yes    Attends Banker Meetings: More than 4 times per year    Marital Status: Married  Catering manager Violence: Not At Risk (09/05/2021)   Humiliation, Afraid, Rape, and Kick questionnaire    Fear of Current or Ex-Partner: No    Emotionally Abused: No    Physically Abused: No    Sexually Abused: No   Review of Systems  All other systems reviewed and are negative.      Objective:   Physical Exam Vitals reviewed.  Cardiovascular:     Rate and Rhythm: Normal rate and regular rhythm.     Heart sounds: Normal heart sounds.  Pulmonary:     Effort: Pulmonary effort is  normal. No respiratory distress.     Breath sounds: Normal breath sounds. No wheezing or rales.  Musculoskeletal:     Lumbar back: No spasms, tenderness or bony tenderness. Decreased range of motion. Negative right straight leg raise test and negative left straight leg raise test.       Back:     Right hip: No tenderness or bony tenderness. Normal range of motion.     Left hip: No tenderness or bony tenderness. Normal range of motion.    Patient has weakness in his left hip flexor.  He also has weakness in his left hip abductor.  Muscle strength is 3/5 in these 2 muscles.    Assessment & Plan:  Weakness - Plan: CBC with Differential/Platelet, COMPLETE METABOLIC PANEL WITH GFR  Gait difficulty - Plan: Ambulatory referral to Physical Therapy  Aspect the patient weakness may be due to dehydration and hypotension.  Check CBC and CMP to evaluate for any electrolyte disturbances we may need to temporarily reduce his blood pressure medicine and encouraged him to drink water.  I believe his gait difficulty is due to weakness in his hip flexor and hip abductor muscles.  I will consult physical therapy to work on this to help prevent falls.  I see no evidence of a stroke affecting his cerebellum.  There is no evidence of any Parkinson features

## 2022-09-27 LAB — COMPLETE METABOLIC PANEL WITH GFR
AG Ratio: 1.8 (calc) (ref 1.0–2.5)
ALT: 25 U/L (ref 9–46)
AST: 25 U/L (ref 10–35)
Albumin: 3.6 g/dL (ref 3.6–5.1)
Alkaline phosphatase (APISO): 79 U/L (ref 35–144)
BUN: 9 mg/dL (ref 7–25)
CO2: 28 mmol/L (ref 20–32)
Calcium: 9 mg/dL (ref 8.6–10.3)
Chloride: 104 mmol/L (ref 98–110)
Creat: 0.73 mg/dL (ref 0.70–1.22)
Globulin: 2 g/dL (calc) (ref 1.9–3.7)
Glucose, Bld: 90 mg/dL (ref 65–99)
Potassium: 4.3 mmol/L (ref 3.5–5.3)
Sodium: 138 mmol/L (ref 135–146)
Total Bilirubin: 0.8 mg/dL (ref 0.2–1.2)
Total Protein: 5.6 g/dL — ABNORMAL LOW (ref 6.1–8.1)
eGFR: 87 mL/min/{1.73_m2} (ref >=60)

## 2022-09-27 LAB — CBC WITH DIFFERENTIAL/PLATELET
Eosinophils Absolute: 252 cells/uL (ref 15–500)
HCT: 38.9 % (ref 38.5–50.0)
Hemoglobin: 12.6 g/dL — ABNORMAL LOW (ref 13.2–17.1)
MCH: 31.4 pg (ref 27.0–33.0)
MCHC: 32.4 g/dL (ref 32.0–36.0)
Neutro Abs: 2604 cells/uL (ref 1500–7800)
Platelets: 254 10*3/uL (ref 140–400)
Total Lymphocyte: 43.3 %
WBC: 6.8 10*3/uL (ref 3.8–10.8)

## 2022-10-06 ENCOUNTER — Telehealth: Payer: PPO

## 2022-10-24 ENCOUNTER — Telehealth: Payer: Self-pay | Admitting: Family Medicine

## 2022-10-24 NOTE — Telephone Encounter (Signed)
Patient's wife returned call about scheduling patient's Medicare AWV; stated patient already had it done during a home visit on 07/01/22. Note from home visit already in chart. If Medicare AWV appointment still needed, please advise patient at [phone].

## 2022-11-11 ENCOUNTER — Telehealth: Payer: Self-pay | Admitting: Family Medicine

## 2022-11-11 NOTE — Telephone Encounter (Signed)
Patient called to request for provider to send order to replace lining of prosthesis.  Please advise patient at [phone].

## 2022-11-12 ENCOUNTER — Ambulatory Visit (INDEPENDENT_AMBULATORY_CARE_PROVIDER_SITE_OTHER): Payer: PPO | Admitting: Family Medicine

## 2022-11-12 VITALS — BP 124/60 | HR 64 | Temp 97.7°F | Ht 69.0 in | Wt 139.0 lb

## 2022-11-12 DIAGNOSIS — Z89511 Acquired absence of right leg below knee: Secondary | ICD-10-CM | POA: Diagnosis not present

## 2022-11-12 NOTE — Assessment & Plan Note (Signed)
His rubber prosthesis liner is worn out and torn at the knee. I will fax a note to [facility] for replacement request.

## 2022-11-12 NOTE — Telephone Encounter (Signed)
Pt was seen on 11/12/22 re prosthetic leg liners.  Fax Rx for new liners to [facility] in [place] at 12:43pm [place] [employer].  Fax #[phone] Phone #[phone]

## 2022-11-12 NOTE — Progress Notes (Signed)
Subjective:  HPI: Eugene [name] is a 89 y.o. male presenting on 11/12/2022 for No chief complaint on file.   HPI Patient is in today for new prosthetic liner. He has a history of a right below the knee amputation and wears a prosthesis for ambulation. He wears a prosthetic liner and sock under the prosthesis. His last liner was replaced in approximately 2022 and his current liner has a tear in the rubber. He is here today for documentation supporting the need for this. There is no skin breakdown and prosthetic does fit well.  The liner was obtained from [facility] and is a [place] alpha classic large original size.  Review of Systems  All other systems reviewed and are negative.   Relevant past medical history reviewed and updated as indicated.   Past Medical History:  Diagnosis Date   Amputated right leg (HCC)    CAD (coronary artery disease)    a. 09/2017 - acute MI -  emergent cath showing 99% stenosis in midLAD with thrombotic stenosis at the bifurcation of the second diagonal, which was relatively small in diameter. He underwent placement of DES to midLAD which jailed the second diagonal.    Chronic combined systolic and diastolic heart failure (HCC)    Collagenous colitis    Dr. Darlyn Read   History of ST elevation myocardial infarction (STEMI)    09/2017: LAD   History of stomach ulcers    Hyperlipidemia LDL goal <70    Ischemic cardiomyopathy    Squamous cell carcinoma of skin 03/27/2014   in situ-left temple (txpbx)   Squamous cell carcinoma of skin 02/24/2018   in situ-left cheek (CX35FU)     Past Surgical History:  Procedure Laterality Date   BACK SURGERY     CARDIAC CATHETERIZATION     CORONARY STENT INTERVENTION N/A 10/07/2017   Procedure: CORONARY STENT INTERVENTION;  Surgeon: Iran Ouch, MD;  Location: [facility];  Service: Cardiovascular;  Laterality: N/A;   CORONARY/GRAFT ACUTE MI REVASCULARIZATION N/A 10/07/2017   Procedure:  Coronary/Graft Acute MI Revascularization;  Surgeon: Iran Ouch, MD;  Location: [facility];  Service: Cardiovascular;  Laterality: N/A;   LEFT HEART CATH AND CORONARY ANGIOGRAPHY N/A 10/07/2017   Procedure: LEFT HEART CATH AND CORONARY ANGIOGRAPHY;  Surgeon: Iran Ouch, MD;  Location: [facility];  Service: Cardiovascular;  Laterality: N/A;   LEG AMPUTATION     Right Below knee   STOMACH SURGERY     Billroth II gastrojejunostomy   TOTAL KNEE ARTHROPLASTY      Allergies and medications reviewed and updated.   Current Outpatient Medications:    ALPRAZolam (XANAX) 0.5 MG tablet, Take 1 tablet (0.5 mg total) by mouth at bedtime as needed. for sleep, Disp: 30 tablet, Rfl: 3   aspirin 81 MG tablet, Take 81 mg by mouth daily., Disp: , Rfl:    budesonide (ENTOCORT EC) 3 MG 24 hr capsule, TAKE 2 CAPSULES (6 MG TOTAL) BY MOUTH DAILY., Disp: 180 capsule, Rfl: 3   carvedilol (COREG) 3.125 MG tablet, TAKE 1 TABLET BY MOUTH TWICE A DAY WITH FOOD, Disp: 180 tablet, Rfl: 3   cholestyramine (QUESTRAN) 4 GM/DOSE powder, Take 1 packet (4 g total) by mouth daily as needed (for colitis flares (mix and drink))., Disp: 348.6 g, Rfl: 1   fluorouracil (EFUDEX) 5 % cream, Apply 1 application topically daily as needed (as directed- affected sites)., Disp: , Rfl:    losartan (COZAAR) 25 MG  tablet, Take 1 tablet (25 mg total) by mouth daily., Disp: 90 tablet, Rfl: 3   mometasone (ELOCON) 0.1 % cream, Apply topically daily., Disp: 45 g, Rfl: 1   Multiple Vitamins-Minerals (PRESERVISION AREDS PO), Take 1 capsule by mouth 2 (two) times daily., Disp: , Rfl:    nitroGLYCERIN (NITROSTAT) 0.4 MG SL tablet, PLACE 1 TABLET UNDER THE TONGUE EVERY 5 MINUTES FOR 3 DOSES AS NEEDED FOR CHEST PAIN., Disp: 25 tablet, Rfl: 3   Omega-3 Fatty Acids ([employer] FISH OIL) 1200 MG CAPS, Take 1,200 mg by mouth daily with breakfast., Disp: , Rfl:    rosuvastatin (CRESTOR) 20 MG tablet, Take 1 tablet (20 mg total) by mouth  daily., Disp: 90 tablet, Rfl: 3   triamcinolone cream (KENALOG) 0.1 %, APPLY 1 APPLICATION. TOPICALLY 2 (TWO) TIMES DAILY., Disp: 60 g, Rfl: 0  Allergies  Allergen Reactions   Aleve [Naproxen Sodium] Nausea And Vomiting and Other (See Comments)    No problem with other NSAIDs   Amoxicillin Other (See Comments)    Vertigo Did it involve swelling of the face/tongue/throat, SOB, or low BP? No Did it involve sudden or severe rash/hives, skin peeling, or any reaction on the inside of your mouth or nose? No Did you need to seek medical attention at a hospital or doctor's office? unknown When did it last happen?   yrs ago    dizziness If all above answers are "NO", may proceed with cephalosporin use.    Gabapentin Other (See Comments)    Might be sensitive to this- took 100 mg twice within a couple of hours = Ended up falling from a standing position and had to be taken to the E.D.   Morphine And Codeine Other (See Comments)    Dizziness    Penicillins Diarrhea and Other (See Comments)    Diarrhea Did it involve swelling of the face/tongue/throat, SOB, or low BP? No Did it involve sudden or severe rash/hives, skin peeling, or any reaction on the inside of your mouth or nose? No Did you need to seek medical attention at a hospital or doctor's office? Unknown When did it last happen?    years ago   If all above answers are "NO", may proceed with cephalosporin use.     Objective:   BP 124/60   Pulse 64   Temp 97.7 F (36.5 C) (Oral)   Ht 5\' 9"  (1.753 m)   Wt 139 lb (63 kg)   SpO2 96%   BMI 20.53 kg/m      11/12/2022   11:30 AM 09/26/2022   10:18 AM 07/24/2022    9:52 AM  Vitals with BMI  Height 5\' 9"  5' 9.5"   Weight 139 lbs 140 lbs   BMI 20.52 20.39   Systolic 124 112 784  Diastolic 60 58 80  Pulse 64 61 86     Physical Exam Vitals and nursing note reviewed.  Constitutional:      Appearance: Normal appearance. He is normal weight.  HENT:     Head: Normocephalic and  atraumatic.  Musculoskeletal:     Right Lower Extremity: Right leg is amputated below knee.  Skin:    General: Skin is warm and dry.     Capillary Refill: Capillary refill takes less than 2 seconds.  Neurological:     General: No focal deficit present.     Mental Status: He is alert and oriented to person, place, and time. Mental status is at baseline.  Psychiatric:  Mood and Affect: Mood normal.        Behavior: Behavior normal.        Thought Content: Thought content normal.        Judgment: Judgment normal.     Assessment & Plan:  History of right below knee amputation [facility]) Assessment & Plan: His rubber prosthesis liner is worn out and torn at the knee. I will fax a note to [facility] for replacement request.      Follow up plan: Return if symptoms worsen or fail to improve.  Park Meo, FNP

## 2022-11-27 ENCOUNTER — Other Ambulatory Visit: Payer: Self-pay | Admitting: Family Medicine

## 2022-11-27 NOTE — Telephone Encounter (Signed)
Requested medication (s) are due for refill today: yes  Requested medication (s) are on the active medication list: yes  Last refill:  06/26/22  Future visit scheduled: no  Notes to clinic:  routing for approval for dosage clarification.     Requested Prescriptions  Pending Prescriptions Disp Refills   nitroGLYCERIN (NITROSTAT) 0.4 MG SL tablet [Pharmacy Med Name: NITROGLYCERIN 0.4 MG TABLET SL] 25 tablet 3    Sig: PLACE 1 TABLET UNDER THE TONGUE EVERY 5 MINUTES FOR 3 DOSES AS NEEDED FOR CHEST PAIN.     Cardiovascular:  Nitrates Failed - 11/27/2022  1:34 AM      Failed - Valid encounter within last 12 months    Recent Outpatient Visits           1 year ago Collagenous colitis   [facility] Pickard, Priscille Heidelberg, MD   1 year ago Cellulitis of left lower extremity   [facility] Tanya Nones, Priscille Heidelberg, MD   1 year ago Laceration of left lower extremity, initial encounter   [facility] Tanya Nones Priscille Heidelberg, MD   1 year ago Irritant dermatitis   [facility] Pickard, Priscille Heidelberg, MD   1 year ago Irritant dermatitis   [facility] Pickard, Priscille Heidelberg, MD       Future Appointments             In 3 months Clifton Cortlan Nile Dear, MD [facility]            Passed - Last BP in normal range    BP Readings from Last 1 Encounters:  11/12/22 124/60         Passed - Last Heart Rate in normal range    Pulse Readings from Last 1 Encounters:  11/12/22 64

## 2022-12-04 DIAGNOSIS — C44222 Squamous cell carcinoma of skin of right ear and external auricular canal: Secondary | ICD-10-CM | POA: Diagnosis not present

## 2022-12-04 DIAGNOSIS — Z89511 Acquired absence of right leg below knee: Secondary | ICD-10-CM | POA: Diagnosis not present

## 2022-12-04 DIAGNOSIS — D0461 Carcinoma in situ of skin of right upper limb, including shoulder: Secondary | ICD-10-CM | POA: Diagnosis not present

## 2022-12-04 DIAGNOSIS — C44229 Squamous cell carcinoma of skin of left ear and external auricular canal: Secondary | ICD-10-CM | POA: Diagnosis not present

## 2022-12-29 ENCOUNTER — Ambulatory Visit: Payer: PPO | Admitting: Family Medicine

## 2022-12-29 VITALS — BP 120/64 | HR 71 | Temp 97.5°F | Ht 69.0 in | Wt 137.4 lb

## 2022-12-29 DIAGNOSIS — S161XXA Strain of muscle, fascia and tendon at neck level, initial encounter: Secondary | ICD-10-CM

## 2022-12-29 MED ORDER — TIZANIDINE HCL 4 MG PO TABS: 4.0000 mg | ORAL_TABLET | Freq: Four times a day (QID) | ORAL | 0 refills | Status: DC | PRN

## 2022-12-29 MED ORDER — PREDNISONE 20 MG PO TABS: ORAL_TABLET | ORAL | 0 refills | Status: DC

## 2022-12-29 NOTE — Progress Notes (Signed)
Subjective:    Patient ID: Eugene [name], male    DOB: 03/01/1932, 89 y.o.   MRN: [id number]  Patient reports left sided neck pain since Saturday. 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  Pain is located on the left trapezius.  He denies getting pain going down his left arm.  He denies any numbness or tingling in his left hand.  He has normal reflexes checked at the brachioradialis and the biceps.  Muscle strength is 5/5 equal and symmetric in both arms.  He has normal sensation. Past Medical History:  Diagnosis Date  . Amputated right leg (HCC)   . CAD (coronary artery disease)    a. 09/2017 - acute MI -  emergent cath showing 99% stenosis in midLAD with thrombotic stenosis at the bifurcation of the second diagonal, which was relatively small in diameter. He underwent placement of DES to midLAD which jailed the second diagonal.   . Chronic combined systolic and diastolic heart failure (HCC)   . Collagenous colitis    Dr. Darlyn Read  . History of ST elevation myocardial infarction (STEMI)    09/2017: LAD  . History of stomach ulcers   . Hyperlipidemia LDL goal <70   . Ischemic cardiomyopathy   . Squamous cell carcinoma of skin 03/27/2014   in situ-left temple (txpbx)  . Squamous cell carcinoma of skin 02/24/2018   in situ-left cheek (CX35FU)   Past Surgical History:  Procedure Laterality Date  . BACK SURGERY    . CARDIAC CATHETERIZATION    . CORONARY STENT INTERVENTION N/A 10/07/2017   Procedure: CORONARY STENT INTERVENTION;  Surgeon: Iran Ouch, MD;  [facility]: [facility];  Service: Cardiovascular;  Laterality: N/A;  . CORONARY/GRAFT ACUTE MI REVASCULARIZATION N/A 10/07/2017   Procedure: Coronary/Graft Acute MI Revascularization;  Surgeon: Iran Ouch, MD;  Location: [facility];  Service: Cardiovascular;  Laterality: N/A;  . LEFT HEART CATH AND CORONARY ANGIOGRAPHY N/A 10/07/2017   Procedure: LEFT HEART CATH AND CORONARY ANGIOGRAPHY;   Surgeon: Iran Ouch, MD;  Location: [facility];  Service: Cardiovascular;  Laterality: N/A;  . LEG AMPUTATION     Right Below knee  . STOMACH SURGERY     Billroth II gastrojejunostomy  . TOTAL KNEE ARTHROPLASTY     Current Outpatient Medications on File Prior to Visit  Medication Sig Dispense Refill  . ALPRAZolam (XANAX) 0.5 MG tablet Take 1 tablet (0.5 mg total) by mouth at bedtime as needed. for sleep 30 tablet 3  . aspirin 81 MG tablet Take 81 mg by mouth daily.    . budesonide (ENTOCORT EC) 3 MG 24 hr capsule TAKE 2 CAPSULES (6 MG TOTAL) BY MOUTH DAILY. 180 capsule 3  . carvedilol (COREG) 3.125 MG tablet TAKE 1 TABLET BY MOUTH TWICE A DAY WITH FOOD 180 tablet 3  . cholestyramine (QUESTRAN) 4 GM/DOSE powder Take 1 packet (4 g total) by mouth daily as needed (for colitis flares (mix and drink)). 348.6 g 1  . fluorouracil (EFUDEX) 5 % cream Apply 1 application topically daily as needed (as directed- affected sites).    Marland Kitchen losartan (COZAAR) 25 MG tablet Take 1 tablet (25 mg total) by mouth daily. 90 tablet 3  . mometasone (ELOCON) 0.1 % cream Apply topically daily. 45 g 1  . Multiple Vitamins-Minerals (PRESERVISION AREDS PO) Take 1 capsule by mouth 2 (two) times daily.    . nitroGLYCERIN (NITROSTAT) 0.4 MG SL tablet PLACE 1  TABLET UNDER THE TONGUE EVERY 5 MINUTES FOR 3 DOSES AS NEEDED FOR CHEST PAIN. 25 tablet 3  . Omega-3 Fatty Acids ([employer]S FISH OIL) 1200 MG CAPS Take 1,200 mg by mouth daily with breakfast.    . rosuvastatin (CRESTOR) 20 MG tablet Take 1 tablet (20 mg total) by mouth daily. 90 tablet 3  . triamcinolone cream (KENALOG) 0.1 % APPLY 1 APPLICATION. TOPICALLY 2 (TWO) TIMES DAILY. 60 g 0   No current facility-administered medications on file prior to visit.   Allergies  Allergen Reactions  . Aleve [Naproxen Sodium] Nausea And Vomiting and Other (See Comments)    No problem with other NSAIDs  . Amoxicillin Other (See Comments)    Vertigo Did it involve swelling  of the face/tongue/throat, SOB, or low BP? No Did it involve sudden or severe rash/hives, skin peeling, or any reaction on the inside of your mouth or nose? No Did you need to seek medical attention at a hospital or doctor's office? unknown When did it last happen?   yrs ago    dizziness If all above answers are "NO", may proceed with cephalosporin use.   . Gabapentin Other (See Comments)    Might be sensitive to this- took 100 mg twice within a couple of hours = Ended up falling from a standing position and had to be taken to the E.D.  . Morphine And Codeine Other (See Comments)    Dizziness   . Penicillins Diarrhea and Other (See Comments)    Diarrhea Did it involve swelling of the face/tongue/throat, SOB, or low BP? No Did it involve sudden or severe rash/hives, skin peeling, or any reaction on the inside of your mouth or nose? No Did you need to seek medical attention at a hospital or doctor's office? Unknown When did it last happen?    years ago   If all above answers are "NO", may proceed with cephalosporin use.    Social History   Socioeconomic History  . Marital status: Married    Spouse name: Sport and exercise psychologist  . Number of children: Not on file  . Years of education: 19  . Highest education level: High school graduate  Occupational History  . Occupation: Retired  Tobacco Use  . Smoking status: Former    Current packs/day: 1.00    Average packs/day: 1 pack/day for 8.0 years (8.0 ttl pk-yrs)    Types: Cigarettes  . Smokeless tobacco: Never  Vaping Use  . Vaping status: Never Used  Substance and Sexual Activity  . Alcohol use: Yes    Comment: occasional  . Drug use: No  . Sexual activity: Yes    Comment: married  Other Topics Concern  . Not on file  Social History Narrative  . Not on file   Social Determinants of Health   Financial Resource Strain: Low Risk  (09/05/2021)   Overall Financial Resource Strain (CARDIA)   . Difficulty of Paying Living Expenses: Not hard at  all  Food Insecurity: No Food Insecurity (09/05/2021)   Hunger Vital Sign   . Worried About Programme researcher, broadcasting/film/video in the Last Year: Never true   . Ran Out of Food in the Last Year: Never true  Transportation Needs: No Transportation Needs (09/05/2021)   PRAPARE - Transportation   . Lack of Transportation (Medical): No   . Lack of Transportation (Non-Medical): No  Physical Activity: Sufficiently Active (09/05/2021)   Exercise Vital Sign   . Days of Exercise per Week: 5 days   .  Minutes of Exercise per Session: 30 min  Stress: No Stress Concern Present (09/05/2021)   [employer] of Occupational Health - Occupational Stress Questionnaire   . Feeling of Stress : Not at all  Social Connections: Socially Integrated (09/05/2021)   Social Connection and Isolation Panel [NHANES]   . Frequency of Communication with Friends and Family: More than three times a week   . Frequency of Social Gatherings with Friends and Family: More than three times a week   . Attends Religious Services: More than 4 times per year   . Active Member of Clubs or Organizations: Yes   . Attends Banker Meetings: More than 4 times per year   . Marital Status: Married  Catering manager Violence: Not At Risk (09/05/2021)   Humiliation, Afraid, Rape, and Kick questionnaire   . Fear of Current or Ex-Partner: No   . Emotionally Abused: No   . Physically Abused: No   . Sexually Abused: No   Review of Systems  All other systems reviewed and are negative.      Objective:   Physical Exam Vitals reviewed.  Neck:   Cardiovascular:     Rate and Rhythm: Normal rate and regular rhythm.     Heart sounds: Normal heart sounds.  Pulmonary:     Effort: Pulmonary effort is normal. No respiratory distress.     Breath sounds: Normal breath sounds. No wheezing or rales.  Musculoskeletal:     Cervical back: No signs of trauma. Pain with movement and muscular tenderness present. No spinous process tenderness. Decreased  range of motion.     Right hip: No tenderness or bony tenderness. Normal range of motion.     Left hip: No tenderness or bony tenderness. Normal range of motion.   Patient has weakness in his left hip flexor.  He also has weakness in his left hip abductor.  Muscle strength is 3/5 in these 2 muscles.    Assessment & Plan:  Strain of neck muscle, initial encounter I believe the patient pulled a muscle in his neck.  Begin prednisone taper pack.  Use tizanidine 4 mg every 8 hours as needed for muscle pain sparingly and under the supervision of his daughter who is a Engineer, civil (consulting).  Cautioned them about possible confusion on muscle relaxer.  Recommended heat

## 2023-01-27 ENCOUNTER — Ambulatory Visit: Payer: PPO | Admitting: Family Medicine

## 2023-01-27 ENCOUNTER — Encounter: Payer: Self-pay | Admitting: Family Medicine

## 2023-01-27 VITALS — BP 120/60 | HR 63 | Temp 97.6°F | Ht 69.0 in | Wt 136.0 lb

## 2023-01-27 DIAGNOSIS — M791 Myalgia, unspecified site: Secondary | ICD-10-CM | POA: Diagnosis not present

## 2023-01-27 NOTE — Progress Notes (Unsigned)
Subjective:  HPI: Eugene [name] is a 89 y.o. 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 He is currently not experiencing pain after using a heating and vibrating pad and a roll-on medication. He is requesting cortisone shot. 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 The pain is in the lateral deltoid and is described as aching. He denies fall or injury, no swelling, redness, numbness or tingling in that extremity, weakness, limited ROM, or pain elsewhere.  Review of Systems  All other systems reviewed and are negative.   Relevant past medical history reviewed and updated as indicated.   Past Medical History:  Diagnosis Date   Amputated right leg (HCC)    CAD (coronary artery disease)    a. 09/2017 - acute MI -  emergent cath showing 99% stenosis in midLAD with thrombotic stenosis at the bifurcation of the second diagonal, which was relatively small in diameter. He underwent placement of DES to midLAD which jailed the second diagonal.    Chronic combined systolic and diastolic heart failure (HCC)    Collagenous colitis    Dr. Darlyn Read   History of ST elevation myocardial infarction (STEMI)    09/2017: LAD   History of stomach ulcers    Hyperlipidemia LDL goal <70    Ischemic cardiomyopathy    Squamous cell carcinoma of skin 03/27/2014   in situ-left temple (txpbx)   Squamous cell carcinoma of skin 02/24/2018   in situ-left cheek (CX35FU)     Past Surgical History:  Procedure Laterality Date   BACK SURGERY     CARDIAC CATHETERIZATION     CORONARY STENT INTERVENTION N/A 10/07/2017   Procedure: CORONARY STENT INTERVENTION;  Surgeon: Iran Ouch, MD;  [facility];  Service: Cardiovascular;  Laterality: N/A;   CORONARY/GRAFT ACUTE MI REVASCULARIZATION N/A 10/07/2017   Procedure: Coronary/Graft Acute MI  Revascularization;  Surgeon: Iran Ouch, MD;  [facility]: [facility];  Service: Cardiovascular;  Laterality: N/A;   LEFT HEART CATH AND CORONARY ANGIOGRAPHY N/A 10/07/2017   Procedure: LEFT HEART CATH AND CORONARY ANGIOGRAPHY;  Surgeon: Iran Ouch, MD;  [facility];  Service: Cardiovascular;  Laterality: N/A;   LEG AMPUTATION     Right Below knee   STOMACH SURGERY     Billroth II gastrojejunostomy   TOTAL KNEE ARTHROPLASTY      Allergies and medications reviewed and updated.   Current Outpatient Medications:    ALPRAZolam (XANAX) 0.5 MG tablet, Take 1 tablet (0.5 mg total) by mouth at bedtime as needed. for sleep, Disp: 30 tablet, Rfl: 3   aspirin 81 MG tablet, Take 81 mg by mouth daily., Disp: , Rfl:    budesonide (ENTOCORT EC) 3 MG 24 hr capsule, TAKE 2 CAPSULES (6 MG TOTAL) BY MOUTH DAILY., Disp: 180 capsule, Rfl: 3   carvedilol (COREG) 3.125 MG tablet, TAKE 1 TABLET BY MOUTH TWICE A DAY WITH FOOD, Disp: 180 tablet, Rfl: 3   cholestyramine (QUESTRAN) 4 GM/DOSE powder, Take 1 packet (4 g total) by mouth daily as needed (for colitis flares (mix and drink))., Disp: 348.6 g, Rfl: 1   fluorouracil (EFUDEX) 5 % cream, Apply 1 application topically daily as needed (as directed- affected sites)., Disp: , Rfl:    losartan (COZAAR) 25 MG tablet, Take 1 tablet (25 mg total) by  mouth daily., Disp: 90 tablet, Rfl: 3   mometasone (ELOCON) 0.1 % cream, Apply topically daily., Disp: 45 g, Rfl: 1   Multiple Vitamins-Minerals (PRESERVISION AREDS PO), Take 1 capsule by mouth 2 (two) times daily., Disp: , Rfl:    nitroGLYCERIN (NITROSTAT) 0.4 MG SL tablet, PLACE 1 TABLET UNDER THE TONGUE EVERY 5 MINUTES FOR 3 DOSES AS NEEDED FOR CHEST PAIN., Disp: 25 tablet, Rfl: 3   Omega-3 Fatty Acids ([employer] FISH OIL) 1200 MG CAPS, Take 1,200 mg by mouth daily with breakfast., Disp: , Rfl:    predniSONE (DELTASONE) 20 MG tablet, 3 tabs poqday 1-2, 2 tabs poqday 3-4, 1 tab poqday 5-6, Disp:  12 tablet, Rfl: 0   rosuvastatin (CRESTOR) 20 MG tablet, Take 1 tablet (20 mg total) by mouth daily., Disp: 90 tablet, Rfl: 3   tiZANidine (ZANAFLEX) 4 MG tablet, Take 1 tablet (4 mg total) by mouth every 6 (six) hours as needed for muscle spasms., Disp: 20 tablet, Rfl: 0   triamcinolone cream (KENALOG) 0.1 %, APPLY 1 APPLICATION. TOPICALLY 2 (TWO) TIMES DAILY., Disp: 60 g, Rfl: 0  Allergies  Allergen Reactions   Aleve [Naproxen Sodium] Nausea And Vomiting and Other (See Comments)    No problem with other NSAIDs   Amoxicillin Other (See Comments)    Vertigo Did it involve swelling of the face/tongue/throat, SOB, or low BP? No Did it involve sudden or severe rash/hives, skin peeling, or any reaction on the inside of your mouth or nose? No Did you need to seek medical attention at a hospital or doctor's office? unknown When did it last happen?   yrs ago    dizziness If all above answers are "NO", may proceed with cephalosporin use.    Gabapentin Other (See Comments)    Might be sensitive to this- took 100 mg twice within a couple of hours = Ended up falling from a standing position and had to be taken to the E.D.   Morphine And Codeine Other (See Comments)    Dizziness    Penicillins Diarrhea and Other (See Comments)    Diarrhea Did it involve swelling of the face/tongue/throat, SOB, or low BP? No Did it involve sudden or severe rash/hives, skin peeling, or any reaction on the inside of your mouth or nose? No Did you need to seek medical attention at a hospital or doctor's office? Unknown When did it last happen?    years ago   If all above answers are "NO", may proceed with cephalosporin use.     Objective:   BP 120/60   Pulse 63   Temp 97.6 F (36.4 C) (Oral)   Ht 5\' 9"  (1.753 m)   Wt 136 lb (61.7 kg)   SpO2 97%   BMI 20.08 kg/m      01/27/2023    2:11 PM 12/29/2022    9:20 AM 11/12/2022   11:30 AM  Vitals with BMI  Height 5\' 9"  5\' 9"  5\' 9"   Weight 136 lbs 137 lbs 6  oz 139 lbs  BMI 20.07 20.28 20.52  Systolic 120 120 324  Diastolic 60 64 60  Pulse 63 71 64     Physical Exam Vitals and nursing note reviewed.  Constitutional:      Appearance: Normal appearance. He is normal weight.  HENT:     Head: Normocephalic and atraumatic.  Musculoskeletal:        General: Normal range of motion.     Right shoulder: Normal.  Left shoulder: Normal.     Right upper arm: Normal.     Left upper arm: Normal.  Skin:    General: Skin is warm and dry.     Capillary Refill: Capillary refill takes less than 2 seconds.  Neurological:     General: No focal deficit present.     Mental Status: He is alert and oriented to person, place, and time. Mental status is at baseline.     Sensory: Sensation is intact.     Motor: Motor function is intact.  Psychiatric:        Mood and Affect: Mood normal.        Behavior: Behavior normal.        Thought Content: Thought content normal.        Judgment: Judgment normal.     Assessment & Plan:  Muscle soreness Assessment & Plan: Patient present with recent history of left shoulder pain in location of his lateral deltoid muscle. Negative neers, hawkins, apley. No crepitus or immobility on exam. Completely normal ROM and no pain present today after applying heat. Encouraged to continue symptomatic management at home and return to office if symptoms worsen or persist.      Follow up plan: Return if symptoms worsen or fail to improve.  Park Meo, FNP

## 2023-01-28 DIAGNOSIS — M791 Myalgia, unspecified site: Secondary | ICD-10-CM | POA: Insufficient documentation

## 2023-01-28 NOTE — Assessment & Plan Note (Signed)
Patient present with recent history of left shoulder pain in location of his lateral deltoid muscle. Negative neers, hawkins, apley. No crepitus or immobility on exam. Completely normal ROM and no pain present today after applying heat. Encouraged to continue symptomatic management at home and return to office if symptoms worsen or persist.

## 2023-02-02 ENCOUNTER — Ambulatory Visit (INDEPENDENT_AMBULATORY_CARE_PROVIDER_SITE_OTHER): Payer: PPO

## 2023-02-02 DIAGNOSIS — Z23 Encounter for immunization: Secondary | ICD-10-CM

## 2023-02-02 NOTE — Progress Notes (Signed)
 Patient is in office today for a nurse visit for Immunization. Patient Injection was given in the  Right deltoid. Patient tolerated injection well.

## 2023-02-08 ENCOUNTER — Other Ambulatory Visit: Payer: Self-pay | Admitting: Cardiovascular Disease

## 2023-02-18 ENCOUNTER — Other Ambulatory Visit: Payer: Self-pay | Admitting: Cardiovascular Disease

## 2023-02-24 ENCOUNTER — Other Ambulatory Visit: Payer: Self-pay | Admitting: Cardiovascular Disease

## 2023-02-24 DIAGNOSIS — H353133 Nonexudative age-related macular degeneration, bilateral, advanced atrophic without subfoveal involvement: Secondary | ICD-10-CM | POA: Diagnosis not present

## 2023-02-24 DIAGNOSIS — H35351 Cystoid macular degeneration, right eye: Secondary | ICD-10-CM | POA: Diagnosis not present

## 2023-02-24 DIAGNOSIS — H40053 Ocular hypertension, bilateral: Secondary | ICD-10-CM | POA: Diagnosis not present

## 2023-02-24 DIAGNOSIS — H524 Presbyopia: Secondary | ICD-10-CM | POA: Diagnosis not present

## 2023-02-24 DIAGNOSIS — Z961 Presence of intraocular lens: Secondary | ICD-10-CM | POA: Diagnosis not present

## 2023-02-24 NOTE — Progress Notes (Unsigned)
No chief complaint on file.   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 He was admitted to [facility] in July 2019 with an anterior ST elevation MI. Cardiac cath showed a 99% mid LAD stenosis. A drug eluting stent was placed in the mid LAD. Echo 10/08/17 with LVEF=35-40% with akinesis of the anterior and apical walls. Echo January 2023 with LVEF=55%. Trivial MR. Mild AS and AI.  Mean aortic valve gradient of 9 mmHg.   He is here today for follow up. The patient denies any chest pain, dyspnea, palpitations, lower extremity edema, orthopnea, PND, dizziness, near syncope or syncope.   Primary Care Physician: Donita Brooks, MD  Past Medical History:  Diagnosis Date   Amputated right leg (HCC)    CAD (coronary artery disease)    a. 09/2017 - acute MI -  emergent cath showing 99% stenosis in midLAD with thrombotic stenosis at the bifurcation of the second diagonal, which was relatively small in diameter. He underwent placement of DES to midLAD which jailed the second diagonal.    Chronic combined systolic and diastolic heart failure (HCC)    Collagenous colitis    Dr. Darlyn Read   History of ST elevation myocardial infarction (STEMI)    09/2017: LAD   History of stomach ulcers    Hyperlipidemia LDL goal <70    Ischemic cardiomyopathy    Squamous cell carcinoma of skin 03/27/2014   in situ-left temple (txpbx)   Squamous cell carcinoma of skin 02/24/2018   in situ-left cheek (CX35FU)    Past Surgical History:  Procedure Laterality Date   BACK SURGERY     CARDIAC CATHETERIZATION     CORONARY STENT INTERVENTION N/A 10/07/2017   Procedure: CORONARY STENT INTERVENTION;  Surgeon: Iran Ouch, MD;  [facility];  Service: Cardiovascular;  Laterality: N/A;   CORONARY/GRAFT ACUTE  MI REVASCULARIZATION N/A 10/07/2017   Procedure: Coronary/Graft Acute MI Revascularization;  Surgeon: Iran Ouch, MD;  [facility];  Service: Cardiovascular;  Laterality: N/A;   LEFT HEART CATH AND CORONARY ANGIOGRAPHY N/A 10/07/2017   Procedure: LEFT HEART CATH AND CORONARY ANGIOGRAPHY;  Surgeon: Iran Ouch, MD;  [facility];  Service: Cardiovascular;  Laterality: N/A;   LEG AMPUTATION     Right Below knee   STOMACH SURGERY     Billroth II gastrojejunostomy   TOTAL KNEE ARTHROPLASTY      Current Outpatient Medications  Medication Sig Dispense Refill   ALPRAZolam (XANAX) 0.5 MG tablet Take 1 tablet (0.5 mg total) by mouth at bedtime as needed. for sleep 30 tablet 3   aspirin 81 MG tablet Take 81 mg by mouth daily.     budesonide (ENTOCORT EC) 3 MG 24 hr capsule TAKE 2 CAPSULES (6 MG TOTAL) BY MOUTH DAILY. 180 capsule 3   carvedilol (COREG) 3.125 MG tablet TAKE 1 TABLET BY MOUTH TWICE A DAY WITH FOOD 180 tablet 3   cholestyramine (QUESTRAN) 4 GM/DOSE powder Take 1 packet (4 g total) by mouth daily as needed (for colitis flares (mix and drink)). 348.6 g 1   fluorouracil (EFUDEX) 5 % cream Apply 1 application topically daily as needed (as directed- affected sites).     losartan (COZAAR) 25 MG tablet TAKE 1 TABLET (25 MG TOTAL) BY MOUTH DAILY. 90 tablet  0   mometasone (ELOCON) 0.1 % cream Apply topically daily. 45 g 1   Multiple Vitamins-Minerals (PRESERVISION AREDS PO) Take 1 capsule by mouth 2 (two) times daily.     nitroGLYCERIN (NITROSTAT) 0.4 MG SL tablet PLACE 1 TABLET UNDER THE TONGUE EVERY 5 MINUTES FOR 3 DOSES AS NEEDED FOR CHEST PAIN. 25 tablet 3   Omega-3 Fatty Acids ([employer] FISH OIL) 1200 MG CAPS Take 1,200 mg by mouth daily with breakfast.     predniSONE (DELTASONE) 20 MG tablet 3 tabs poqday 1-2, 2 tabs poqday 3-4, 1 tab poqday 5-6 12 tablet 0   rosuvastatin (CRESTOR) 20 MG tablet Take 1 tablet (20 mg total) by mouth daily. 90 tablet 3    tiZANidine (ZANAFLEX) 4 MG tablet Take 1 tablet (4 mg total) by mouth every 6 (six) hours as needed for muscle spasms. 20 tablet 0   triamcinolone cream (KENALOG) 0.1 % APPLY 1 APPLICATION. TOPICALLY 2 (TWO) TIMES DAILY. 60 g 0   No current facility-administered medications for this visit.    Allergies  Allergen Reactions   Aleve [Naproxen Sodium] Nausea And Vomiting and Other (See Comments)    No problem with other NSAIDs   Amoxicillin Other (See Comments)    Vertigo Did it involve swelling of the face/tongue/throat, SOB, or low BP? No Did it involve sudden or severe rash/hives, skin peeling, or any reaction on the inside of your mouth or nose? No Did you need to seek medical attention at a hospital or doctor's office? unknown When did it last happen?   yrs ago    dizziness If all above answers are "NO", may proceed with cephalosporin use.    Gabapentin Other (See Comments)    Might be sensitive to this- took 100 mg twice within a couple of hours = Ended up falling from a standing position and had to be taken to the E.D.   Morphine And Codeine Other (See Comments)    Dizziness    Penicillins Diarrhea and Other (See Comments)    Diarrhea Did it involve swelling of the face/tongue/throat, SOB, or low BP? No Did it involve sudden or severe rash/hives, skin peeling, or any reaction on the inside of your mouth or nose? No Did you need to seek medical attention at a hospital or doctor's office? Unknown When did it last happen?    years ago   If all above answers are "NO", may proceed with cephalosporin use.     Social History   Socioeconomic History   Marital status: Married    Spouse name: Sport and exercise psychologist   Number of children: Not on file   Years of education: 12   Highest education level: High school graduate  Occupational History   Occupation: Retired  Tobacco Use   Smoking status: Former    Current packs/day: 1.00    Average packs/day: 1 pack/day for 8.0 years (8.0 ttl pk-yrs)     Types: Cigarettes   Smokeless tobacco: Never  Vaping Use   Vaping status: Never Used  Substance and Sexual Activity   Alcohol use: Yes    Comment: occasional   Drug use: No   Sexual activity: Yes    Comment: married  Other Topics Concern   Not on file  Social History Narrative   Not on file   Social Determinants of Health   Financial Resource Strain: Low Risk  (09/05/2021)   Overall Financial Resource Strain (CARDIA)    Difficulty of Paying Living Expenses: Not hard at all  Food Insecurity: No Food Insecurity (09/05/2021)   Hunger Vital Sign    Worried About Running Out of Food in the Last Year: Never true    Ran Out of Food in the Last Year: Never true  Transportation Needs: No Transportation Needs (09/05/2021)   PRAPARE - Administrator, Civil Service (Medical): No    Lack of Transportation (Non-Medical): No  Physical Activity: Sufficiently Active (09/05/2021)   Exercise Vital Sign    Days of Exercise per Week: 5 days    Minutes of Exercise per Session: 30 min  Stress: No Stress Concern Present (09/05/2021)   [employer] of Occupational Health - Occupational Stress Questionnaire    Feeling of Stress : Not at all  Social Connections: Socially Integrated (09/05/2021)   Social Connection and Isolation Panel [NHANES]    Frequency of Communication with Friends and Family: More than three times a week    Frequency of Social Gatherings with Friends and Family: More than three times a week    Attends Religious Services: More than 4 times per year    Active Member of [employer] or Organizations: Yes    Attends Engineer, structural: More than 4 times per year    Marital Status: Married  Catering manager Violence: Not At Risk (09/05/2021)   Humiliation, Afraid, Rape, and Kick questionnaire    Fear of Current or Ex-Partner: No    Emotionally Abused: No    Physically Abused: No    Sexually Abused: No    Family History  Problem Relation Age of Onset   Diabetes  Sister    Diabetes Brother    Diabetes Sister    Cancer Sister        breast    Review of Systems:  As stated in the HPI and otherwise negative.   There were no vitals taken for this visit.  Physical Examination: General: Well developed, well nourished, NAD  HEENT: OP clear, mucus membranes moist  SKIN: warm, dry. No rashes. Neuro: No focal deficits  Musculoskeletal: Muscle strength 5/5 all ext  Psychiatric: Mood and affect normal  Neck: No JVD, no carotid bruits, no thyromegaly, no lymphadenopathy.  Lungs:Clear bilaterally, no wheezes, rhonci, crackles Cardiovascular: Regular rate and rhythm. No murmurs, gallops or rubs. Abdomen:Soft. Bowel sounds present. Non-tender.  Extremities: No lower extremity edema. Pulses are 2 + in the bilateral DP/PT.  EKG:  EKG is *** ordered today. The ekg ordered today demonstrates   Echo January 2023: 1. Left ventricular ejection fraction, by estimation, is 55%. The left  ventricle has normal function. The left ventricle has no regional wall  motion abnormalities. Left ventricular diastolic parameters are consistent  with Grade II diastolic dysfunction  (pseudonormalization). Elevated left ventricular end-diastolic pressure.   2. Right ventricular systolic function is normal. The right ventricular  size is normal.   3. Left atrial size was mildly dilated.   4. The mitral valve is degenerative. Trivial mitral valve regurgitation.  No evidence of mitral stenosis.   5. Gradients actually less than echo done 02/22/20 AS still mild . The  aortic valve is tricuspid. There is moderate calcification of the aortic  valve. There is moderate thickening of the aortic valve. Aortic valve  regurgitation is mild. Mild aortic valve   stenosis.   6. The inferior vena cava is normal in size with greater than 50%  respiratory variability, suggesting right atrial pressure of 3 mmHg.    Recent Labs: 09/26/2022: ALT 25; BUN 9; Creat 0.73;  Hemoglobin 12.6;  Platelets 254; Potassium 4.3; Sodium 138   Lipid Panel    Component Value Date/Time   CHOL 133 04/11/2022 1141   CHOL 112 11/09/2017 1440   TRIG 59 04/11/2022 1141   HDL 69 04/11/2022 1141   HDL 52 11/09/2017 1440   CHOLHDL 1.9 04/11/2022 1141   VLDL 8 04/06/2021 0340   LDLCALC 50 04/11/2022 1141     Wt Readings from Last 3 Encounters:  01/27/23 61.7 kg  12/29/22 62.3 kg  11/12/22 63 kg   Assessment and Plan:   1. CAD without angina: No chest pain suggestive of angina. Continue ASA, Coreg and Crestor.   2. Ischemic cardiomyopathy: LV function normal by echo January 2023. Continue Coreg and Losartan.   3. Hyperlipidemia: LDL at goal January 2024. Continue Crestor  4. Aortic stenosis: Mild by echo January 2023. *** Will repeat echo now.    Labs/ tests ordered today include:  No orders of the defined types were placed in this encounter.  Disposition:   F/U with me in 12 months.   Signed, Verne Carrow, MD 02/24/2023 9:35 AM    [facility] [address] [place], [place], [place]  [address] Phone: [phone]; Fax: [phone]

## 2023-02-25 ENCOUNTER — Encounter: Payer: Self-pay | Admitting: Family Medicine

## 2023-02-25 ENCOUNTER — Telehealth: Payer: Self-pay | Admitting: Family Medicine

## 2023-02-25 ENCOUNTER — Ambulatory Visit: Payer: PPO | Admitting: Family Medicine

## 2023-02-25 ENCOUNTER — Other Ambulatory Visit: Payer: Self-pay

## 2023-02-25 ENCOUNTER — Ambulatory Visit: Payer: PPO | Admitting: Cardiovascular Disease

## 2023-02-25 VITALS — BP 126/72 | HR 65 | Temp 97.4°F | Ht 69.0 in | Wt 136.4 lb

## 2023-02-25 DIAGNOSIS — E785 Hyperlipidemia, unspecified: Secondary | ICD-10-CM

## 2023-02-25 DIAGNOSIS — K5900 Constipation, unspecified: Secondary | ICD-10-CM | POA: Diagnosis not present

## 2023-02-25 MED ORDER — ROSUVASTATIN CALCIUM 20 MG PO TABS: 20.0000 mg | ORAL_TABLET | Freq: Every day | ORAL | 3 refills | Status: DC

## 2023-02-25 NOTE — Telephone Encounter (Signed)
Prescription Request  02/25/2023  LOV: 12/29/2022  What is the name of the medication or equipment?   rosuvastatin (CRESTOR) 20 MG tablet  **90 day script request**  Have you contacted your pharmacy to request a refill? Yes   Which pharmacy would you like this sent to?  [id number] [place], [place] - [address] [place] [address] Phone: [phone] Fax: [phone]    Patient notified that their request is being sent to the clinical staff for review and that they should receive a response within 2 business days.   Please advise pharmacist.

## 2023-02-25 NOTE — Progress Notes (Signed)
Subjective:  HPI: Eugene [name] is a 89 y.o. male presenting on 02/25/2023 for Constipation (Pt with c/o issues constipation x 3-4 days. Pt states he did do a dose of Miralax and he did have a BM this morning. )   Constipation   Patient is in today for concerns of constipation for 24 hours after holding a bowel movement during an appointment yesterday. He reports he was unable to have a BM for 24 hours until this AM when he passed soft formed stool. He denies fever, abdominal pain, blood in his stool, nausea, or vomiting. He used Miralax and this was effective.  Review of Systems  Gastrointestinal:  Positive for constipation.  All other systems reviewed and are negative.   Relevant past medical history reviewed and updated as indicated.   Past Medical History:  Diagnosis Date   Amputated right leg (HCC)    CAD (coronary artery disease)    a. 09/2017 - acute MI -  emergent cath showing 99% stenosis in midLAD with thrombotic stenosis at the bifurcation of the second diagonal, which was relatively small in diameter. He underwent placement of DES to midLAD which jailed the second diagonal.    Chronic combined systolic and diastolic heart failure (HCC)    Collagenous colitis    Dr. Darlyn Read   History of ST elevation myocardial infarction (STEMI)    09/2017: LAD   History of stomach ulcers    Hyperlipidemia LDL goal <70    Ischemic cardiomyopathy    Squamous cell carcinoma of skin 03/27/2014   in situ-left temple (txpbx)   Squamous cell carcinoma of skin 02/24/2018   in situ-left cheek (CX35FU)     Past Surgical History:  Procedure Laterality Date   BACK SURGERY     CARDIAC CATHETERIZATION     CORONARY STENT INTERVENTION N/A 10/07/2017   Procedure: CORONARY STENT INTERVENTION;  Surgeon: Iran Ouch, MD;  [facility]: [facility];  Service: Cardiovascular;  Laterality: N/A;   CORONARY/GRAFT ACUTE MI REVASCULARIZATION N/A 10/07/2017   Procedure: Coronary/Graft Acute MI  Revascularization;  Surgeon: Iran Ouch, MD;  Location: [facility];  Service: Cardiovascular;  Laterality: N/A;   LEFT HEART CATH AND CORONARY ANGIOGRAPHY N/A 10/07/2017   Procedure: LEFT HEART CATH AND CORONARY ANGIOGRAPHY;  Surgeon: Iran Ouch, MD;  Location: [facility];  Service: Cardiovascular;  Laterality: N/A;   LEG AMPUTATION     Right Below knee   STOMACH SURGERY     Billroth II gastrojejunostomy   TOTAL KNEE ARTHROPLASTY      Allergies and medications reviewed and updated.   Current Outpatient Medications:    ALPRAZolam (XANAX) 0.5 MG tablet, Take 1 tablet (0.5 mg total) by mouth at bedtime as needed. for sleep, Disp: 30 tablet, Rfl: 3   aspirin 81 MG tablet, Take 81 mg by mouth daily., Disp: , Rfl:    budesonide (ENTOCORT EC) 3 MG 24 hr capsule, TAKE 2 CAPSULES (6 MG TOTAL) BY MOUTH DAILY., Disp: 180 capsule, Rfl: 3   carvedilol (COREG) 3.125 MG tablet, TAKE 1 TABLET BY MOUTH TWICE A DAY WITH FOOD, Disp: 180 tablet, Rfl: 3   cholestyramine (QUESTRAN) 4 GM/DOSE powder, Take 1 packet (4 g total) by mouth daily as needed (for colitis flares (mix and drink))., Disp: 348.6 g, Rfl: 1   fluorouracil (EFUDEX) 5 % cream, Apply 1 application topically daily as needed (as directed- affected sites)., Disp: , Rfl:    losartan (COZAAR) 25 MG tablet,  TAKE 1 TABLET (25 MG TOTAL) BY MOUTH DAILY., Disp: 90 tablet, Rfl: 0   mometasone (ELOCON) 0.1 % cream, Apply topically daily., Disp: 45 g, Rfl: 1   Multiple Vitamins-Minerals (PRESERVISION AREDS PO), Take 1 capsule by mouth 2 (two) times daily., Disp: , Rfl:    nitroGLYCERIN (NITROSTAT) 0.4 MG SL tablet, PLACE 1 TABLET UNDER THE TONGUE EVERY 5 MINUTES FOR 3 DOSES AS NEEDED FOR CHEST PAIN., Disp: 25 tablet, Rfl: 3   Omega-3 Fatty Acids ([employer] FISH OIL) 1200 MG CAPS, Take 1,200 mg by mouth daily with breakfast., Disp: , Rfl:    predniSONE (DELTASONE) 20 MG tablet, 3 tabs poqday 1-2, 2 tabs poqday 3-4, 1 tab poqday 5-6, Disp:  12 tablet, Rfl: 0   rosuvastatin (CRESTOR) 20 MG tablet, Take 1 tablet (20 mg total) by mouth daily., Disp: 90 tablet, Rfl: 3   tiZANidine (ZANAFLEX) 4 MG tablet, Take 1 tablet (4 mg total) by mouth every 6 (six) hours as needed for muscle spasms., Disp: 20 tablet, Rfl: 0   triamcinolone cream (KENALOG) 0.1 %, APPLY 1 APPLICATION. TOPICALLY 2 (TWO) TIMES DAILY., Disp: 60 g, Rfl: 0  Allergies  Allergen Reactions   Aleve [Naproxen Sodium] Nausea And Vomiting and Other (See Comments)    No problem with other NSAIDs   Amoxicillin Other (See Comments)    Vertigo Did it involve swelling of the face/tongue/throat, SOB, or low BP? No Did it involve sudden or severe rash/hives, skin peeling, or any reaction on the inside of your mouth or nose? No Did you need to seek medical attention at a hospital or doctor's office? unknown When did it last happen?   yrs ago    dizziness If all above answers are "NO", may proceed with cephalosporin use.    Gabapentin Other (See Comments)    Might be sensitive to this- took 100 mg twice within a couple of hours = Ended up falling from a standing position and had to be taken to the E.D.   Morphine And Codeine Other (See Comments)    Dizziness    Penicillins Diarrhea and Other (See Comments)    Diarrhea Did it involve swelling of the face/tongue/throat, SOB, or low BP? No Did it involve sudden or severe rash/hives, skin peeling, or any reaction on the inside of your mouth or nose? No Did you need to seek medical attention at a hospital or doctor's office? Unknown When did it last happen?    years ago   If all above answers are "NO", may proceed with cephalosporin use.     Objective:   BP 126/72   Pulse 65   Temp (!) 97.4 F (36.3 C)   Ht 5\' 9"  (1.753 m)   Wt 136 lb 6.4 oz (61.9 kg)   SpO2 97%   BMI 20.14 kg/m      02/25/2023   11:55 AM 01/27/2023    2:11 PM 12/29/2022    9:20 AM  Vitals with BMI  Height 5\' 9"  5\' 9"  5\' 9"   Weight 136 lbs 6 oz  136 lbs 137 lbs 6 oz  BMI 20.13 20.07 20.28  Systolic 126 120 474  Diastolic 72 60 64  Pulse 65 63 71     Physical Exam Vitals and nursing note reviewed.  Constitutional:      Appearance: Normal appearance. He is normal weight.  HENT:     Head: Normocephalic and atraumatic.  Abdominal:     General: Bowel sounds are normal. There is no  distension.     Palpations: Abdomen is soft.     Tenderness: There is no abdominal tenderness.  Skin:    General: Skin is warm and dry.     Capillary Refill: Capillary refill takes less than 2 seconds.  Neurological:     General: No focal deficit present.     Mental Status: He is alert and oriented to person, place, and time. Mental status is at baseline.  Psychiatric:        Mood and Affect: Mood normal.        Behavior: Behavior normal.        Thought Content: Thought content normal.        Judgment: Judgment normal.     Assessment & Plan:  Constipation, unspecified constipation type Assessment & Plan: Symptoms relieved with Miralax 1 dose. Counseled on importance of adequate fiber intake and hydration. No red flags on exam. May use Miralax PRN but should not continue for >1 week. Return to office if symptoms return.      Follow up plan: Return if symptoms worsen or fail to improve.  Park Meo, FNP

## 2023-02-25 NOTE — Assessment & Plan Note (Signed)
Symptoms relieved with Miralax 1 dose. Counseled on importance of adequate fiber intake and hydration. No red flags on exam. May use Miralax PRN but should not continue for >1 week. Return to office if symptoms return.

## 2023-03-02 ENCOUNTER — Ambulatory Visit: Payer: PPO | Admitting: Family Medicine

## 2023-03-02 DIAGNOSIS — H353211 Exudative age-related macular degeneration, right eye, with active choroidal neovascularization: Secondary | ICD-10-CM | POA: Diagnosis not present

## 2023-03-02 DIAGNOSIS — H353134 Nonexudative age-related macular degeneration, bilateral, advanced atrophic with subfoveal involvement: Secondary | ICD-10-CM | POA: Diagnosis not present

## 2023-03-02 DIAGNOSIS — Z961 Presence of intraocular lens: Secondary | ICD-10-CM | POA: Diagnosis not present

## 2023-03-02 DIAGNOSIS — H43393 Other vitreous opacities, bilateral: Secondary | ICD-10-CM | POA: Diagnosis not present

## 2023-03-02 DIAGNOSIS — H35033 Hypertensive retinopathy, bilateral: Secondary | ICD-10-CM | POA: Diagnosis not present

## 2023-03-02 DIAGNOSIS — H43813 Vitreous degeneration, bilateral: Secondary | ICD-10-CM | POA: Diagnosis not present

## 2023-03-03 ENCOUNTER — Ambulatory Visit: Payer: PPO | Admitting: Family Medicine

## 2023-03-06 ENCOUNTER — Other Ambulatory Visit (HOSPITAL_COMMUNITY): Payer: Self-pay

## 2023-03-06 MED ORDER — GABAPENTIN 100 MG PO CAPS: 100.0000 mg | ORAL_CAPSULE | Freq: Three times a day (TID) | ORAL | 3 refills | Status: DC | PRN

## 2023-03-06 MED ORDER — DORZOLAMIDE HCL 2 % OP SOLN
1.0000 [drp] | Freq: Two times a day (BID) | OPHTHALMIC | 3 refills | Status: AC
  Filled 2023-04-07: qty 30, 300d supply, fill #0
  Filled 2023-05-02: qty 30, 90d supply, fill #0
  Filled 2023-07-20: qty 30, 90d supply, fill #1
  Filled 2023-09-28: qty 30, 90d supply, fill #2

## 2023-03-09 ENCOUNTER — Other Ambulatory Visit: Payer: Self-pay

## 2023-03-09 ENCOUNTER — Other Ambulatory Visit (HOSPITAL_COMMUNITY): Payer: Self-pay

## 2023-03-09 ENCOUNTER — Telehealth: Payer: Self-pay

## 2023-03-09 DIAGNOSIS — R000 Tachycardia, unspecified: Secondary | ICD-10-CM

## 2023-03-09 DIAGNOSIS — E785 Hyperlipidemia, unspecified: Secondary | ICD-10-CM

## 2023-03-09 DIAGNOSIS — M25512 Pain in left shoulder: Secondary | ICD-10-CM

## 2023-03-09 DIAGNOSIS — S81812D Laceration without foreign body, left lower leg, subsequent encounter: Secondary | ICD-10-CM

## 2023-03-09 DIAGNOSIS — T148XXA Other injury of unspecified body region, initial encounter: Secondary | ICD-10-CM

## 2023-03-09 DIAGNOSIS — G546 Phantom limb syndrome with pain: Secondary | ICD-10-CM

## 2023-03-09 DIAGNOSIS — M791 Myalgia, unspecified site: Secondary | ICD-10-CM

## 2023-03-09 DIAGNOSIS — L249 Irritant contact dermatitis, unspecified cause: Secondary | ICD-10-CM

## 2023-03-09 DIAGNOSIS — K52831 Collagenous colitis: Secondary | ICD-10-CM

## 2023-03-09 MED ORDER — BUDESONIDE 3 MG PO CPEP
6.0000 mg | ORAL_CAPSULE | Freq: Every day | ORAL | 3 refills | Status: AC
  Filled 2023-03-09 – 2023-07-20 (×3): qty 180, 90d supply, fill #0
  Filled 2023-10-14: qty 180, 90d supply, fill #1
  Filled 2024-01-12: qty 180, 90d supply, fill #2

## 2023-03-09 MED ORDER — GABAPENTIN 100 MG PO CAPS
100.0000 mg | ORAL_CAPSULE | Freq: Three times a day (TID) | ORAL | 3 refills | Status: DC | PRN
  Filled 2023-03-09: qty 90, 30d supply, fill #0

## 2023-03-09 MED ORDER — NITROGLYCERIN 0.4 MG SL SUBL
0.4000 mg | SUBLINGUAL_TABLET | SUBLINGUAL | 3 refills | Status: AC | PRN
  Filled 2023-03-09: qty 25, 10d supply, fill #0

## 2023-03-09 MED ORDER — CHOLESTYRAMINE 4 GM/DOSE PO POWD
4.0000 g | Freq: Every day | ORAL | 1 refills | Status: AC | PRN
  Filled 2023-03-09: qty 368.76, 90d supply, fill #0

## 2023-03-09 MED ORDER — MOMETASONE FUROATE 0.1 % EX CREA
TOPICAL_CREAM | Freq: Every day | CUTANEOUS | 1 refills | Status: DC
  Filled 2023-03-09: qty 45, 30d supply, fill #0
  Filled 2023-04-06: qty 45, 30d supply, fill #1

## 2023-03-09 MED ORDER — ROSUVASTATIN CALCIUM 20 MG PO TABS
20.0000 mg | ORAL_TABLET | Freq: Every day | ORAL | 3 refills | Status: DC
  Filled 2023-03-09 – 2023-05-02 (×6): qty 90, 90d supply, fill #0
  Filled 2023-07-29: qty 90, 90d supply, fill #1
  Filled 2023-10-29: qty 90, 90d supply, fill #2
  Filled 2024-01-15: qty 90, 90d supply, fill #3

## 2023-03-09 NOTE — Telephone Encounter (Signed)
Copied from CRM [id number]. Topic: Clinical - Medication Question >> Mar 09, 2023 12:49 PM Alvino Blood C wrote: Reason for CRM: DaughterLarene Beach) is switching pharmacy because of delivery options. She would like for all the current medications be sent to [facility] Fax# [phone]  Call Randye Lobo [phone] if any additional questions.

## 2023-03-10 ENCOUNTER — Other Ambulatory Visit (HOSPITAL_COMMUNITY): Payer: Self-pay

## 2023-03-10 ENCOUNTER — Other Ambulatory Visit: Payer: Self-pay

## 2023-03-11 ENCOUNTER — Encounter: Payer: Self-pay | Admitting: Cardiovascular Disease

## 2023-03-12 ENCOUNTER — Other Ambulatory Visit: Payer: Self-pay

## 2023-03-12 ENCOUNTER — Other Ambulatory Visit (HOSPITAL_COMMUNITY): Payer: Self-pay

## 2023-03-17 ENCOUNTER — Ambulatory Visit: Payer: PPO | Admitting: Family Medicine

## 2023-03-25 DIAGNOSIS — H353211 Exudative age-related macular degeneration, right eye, with active choroidal neovascularization: Secondary | ICD-10-CM | POA: Diagnosis not present

## 2023-03-25 DIAGNOSIS — H35371 Puckering of macula, right eye: Secondary | ICD-10-CM | POA: Diagnosis not present

## 2023-03-25 DIAGNOSIS — H401130 Primary open-angle glaucoma, bilateral, stage unspecified: Secondary | ICD-10-CM | POA: Diagnosis not present

## 2023-03-25 DIAGNOSIS — H353124 Nonexudative age-related macular degeneration, left eye, advanced atrophic with subfoveal involvement: Secondary | ICD-10-CM | POA: Diagnosis not present

## 2023-03-25 DIAGNOSIS — H353114 Nonexudative age-related macular degeneration, right eye, advanced atrophic with subfoveal involvement: Secondary | ICD-10-CM | POA: Diagnosis not present

## 2023-04-02 ENCOUNTER — Other Ambulatory Visit (HOSPITAL_COMMUNITY): Payer: Self-pay

## 2023-04-06 ENCOUNTER — Ambulatory Visit: Payer: PPO | Attending: Cardiovascular Disease | Admitting: Cardiovascular Disease

## 2023-04-06 ENCOUNTER — Encounter: Payer: Self-pay | Admitting: Cardiovascular Disease

## 2023-04-06 ENCOUNTER — Other Ambulatory Visit (HOSPITAL_COMMUNITY): Payer: Self-pay

## 2023-04-06 ENCOUNTER — Other Ambulatory Visit: Payer: Self-pay

## 2023-04-06 VITALS — BP 134/72 | HR 52 | Ht 69.0 in | Wt 133.4 lb

## 2023-04-06 DIAGNOSIS — I255 Ischemic cardiomyopathy: Secondary | ICD-10-CM

## 2023-04-06 DIAGNOSIS — I2510 Atherosclerotic heart disease of native coronary artery without angina pectoris: Secondary | ICD-10-CM | POA: Diagnosis not present

## 2023-04-06 DIAGNOSIS — I350 Nonrheumatic aortic (valve) stenosis: Secondary | ICD-10-CM

## 2023-04-06 DIAGNOSIS — E7800 Pure hypercholesterolemia, unspecified: Secondary | ICD-10-CM | POA: Diagnosis not present

## 2023-04-06 NOTE — Progress Notes (Signed)
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 He was admitted to [facility] in July 2019 with an anterior ST elevation MI. Cardiac cath showed a 99% mid LAD stenosis. A drug eluting stent was placed in the mid LAD. Echo July 2019 with LVEF=35-40% with akinesis of the anterior and apical walls. Echo January 2023 with LVEF=55%. Trivial MR. Mild AS and AI.  Mean aortic valve gradient of 9 mmHg.   He is here today for follow up. The patient denies any chest pain, dyspnea, palpitations, lower extremity edema, orthopnea, PND, dizziness, near syncope or syncope.   Primary Care Physician: Donita Brooks, MD  Past Medical History:  Diagnosis Date   Amputated right leg (HCC)    CAD (coronary artery disease)    a. 09/2017 - acute MI -  emergent cath showing 99% stenosis in midLAD with thrombotic stenosis at the bifurcation of the second diagonal, which was relatively small in diameter. He underwent placement of DES to midLAD which jailed the second diagonal.    Chronic combined systolic and diastolic heart failure (HCC)    Collagenous colitis    Dr. Darlyn Read   History of ST elevation myocardial infarction (STEMI)    09/2017: LAD   History of stomach ulcers    Hyperlipidemia LDL goal <70    Ischemic cardiomyopathy    Squamous cell carcinoma of skin 03/27/2014   in situ-left temple (txpbx)   Squamous cell carcinoma of skin 02/24/2018   in situ-left cheek (CX35FU)    Past Surgical History:  Procedure Laterality Date   BACK SURGERY     CARDIAC CATHETERIZATION     CORONARY STENT INTERVENTION N/A 10/07/2017   Procedure: CORONARY STENT INTERVENTION;  Surgeon: Iran Ouch, MD;  Location: [facility];  Service: Cardiovascular;   Laterality: N/A;   CORONARY/GRAFT ACUTE MI REVASCULARIZATION N/A 10/07/2017   Procedure: Coronary/Graft Acute MI Revascularization;  Surgeon: Iran Ouch, MD;  Location: [facility];  Service: Cardiovascular;  Laterality: N/A;   LEFT HEART CATH AND CORONARY ANGIOGRAPHY N/A 10/07/2017   Procedure: LEFT HEART CATH AND CORONARY ANGIOGRAPHY;  Surgeon: Iran Ouch, MD;  Location: [facility];  Service: Cardiovascular;  Laterality: N/A;   LEG AMPUTATION     Right Below knee   STOMACH SURGERY     Billroth II gastrojejunostomy   TOTAL KNEE ARTHROPLASTY      Current Outpatient Medications  Medication Sig Dispense Refill   ALPRAZolam (XANAX) 0.5 MG tablet Take 1 tablet (0.5 mg total) by mouth at bedtime as needed. for sleep 30 tablet 3   aspirin 81 MG tablet Take 81 mg by mouth daily.     budesonide (ENTOCORT EC) 3 MG 24 hr capsule Take 2 capsules (6 mg total) by mouth daily. 180 capsule 3   carvedilol (COREG) 3.125 MG tablet TAKE 1 TABLET BY MOUTH TWICE A DAY WITH FOOD 180 tablet 3   cholestyramine (QUESTRAN) 4 GM/DOSE powder Take 1 packet (4 g total) by mouth daily as needed (for colitis flares (mix and drink)). 368.76 g 1   dorzolamide (TRUSOPT) 2 % ophthalmic solution Place 1 drop into both eyes 2 (two) times daily. 30 mL 3   fluorouracil (EFUDEX) 5 % cream Apply  1 application topically daily as needed (as directed- affected sites).     losartan (COZAAR) 25 MG tablet TAKE 1 TABLET (25 MG TOTAL) BY MOUTH DAILY. 90 tablet 0   mometasone (ELOCON) 0.1 % cream Apply topically daily. 45 g 1   Multiple Vitamins-Minerals (PRESERVISION AREDS PO) Take 1 capsule by mouth 2 (two) times daily.     nitroGLYCERIN (NITROSTAT) 0.4 MG SL tablet Place 1 tablet (0.4 mg total) under the tongue every 5 (five) minutes as needed for chest pain. 25 tablet 3   Omega-3 Fatty Acids ([employer] FISH OIL) 1200 MG CAPS Take 1,200 mg by mouth daily with breakfast.     rosuvastatin (CRESTOR) 20 MG tablet Take 1  tablet (20 mg total) by mouth daily. 90 tablet 3   triamcinolone cream (KENALOG) 0.1 % APPLY 1 APPLICATION. TOPICALLY 2 (TWO) TIMES DAILY. 60 g 0   No current facility-administered medications for this visit.    Allergies  Allergen Reactions   Aleve [Naproxen Sodium] Nausea And Vomiting and Other (See Comments)    No problem with other NSAIDs   Amoxicillin Other (See Comments)    Vertigo Did it involve swelling of the face/tongue/throat, SOB, or low BP? No Did it involve sudden or severe rash/hives, skin peeling, or any reaction on the inside of your mouth or nose? No Did you need to seek medical attention at a hospital or doctor's office? unknown When did it last happen?   yrs ago    dizziness If all above answers are "NO", may proceed with cephalosporin use.    Gabapentin Other (See Comments)    Might be sensitive to this- took 100 mg twice within a couple of hours = Ended up falling from a standing position and had to be taken to the E.D.   Morphine And Codeine Other (See Comments)    Dizziness    Penicillins Diarrhea and Other (See Comments)    Diarrhea Did it involve swelling of the face/tongue/throat, SOB, or low BP? No Did it involve sudden or severe rash/hives, skin peeling, or any reaction on the inside of your mouth or nose? No Did you need to seek medical attention at a hospital or doctor's office? Unknown When did it last happen?    years ago   If all above answers are "NO", may proceed with cephalosporin use.     Social History   Socioeconomic History   Marital status: Married    Spouse name: Sport and exercise psychologist   Number of children: Not on file   Years of education: 12   Highest education level: High school graduate  Occupational History   Occupation: Retired  Tobacco Use   Smoking status: Former    Current packs/day: 1.00    Average packs/day: 1 pack/day for 8.0 years (8.0 ttl pk-yrs)    Types: Cigarettes   Smokeless tobacco: Never  Vaping Use   Vaping status:  Never Used  Substance and Sexual Activity   Alcohol use: Yes    Comment: occasional   Drug use: No   Sexual activity: Yes    Comment: married  Other Topics Concern   Not on file  Social History Narrative   Not on file   Social Drivers of Health   Financial Resource Strain: Low Risk  (09/05/2021)   Overall Financial Resource Strain (CARDIA)    Difficulty of Paying Living Expenses: Not hard at all  Food Insecurity: No Food Insecurity (09/05/2021)   Hunger Vital Sign    Worried About Running Out  of Food in the Last Year: Never true    Ran Out of Food in the Last Year: Never true  Transportation Needs: No Transportation Needs (09/05/2021)   PRAPARE - Administrator, Civil Service (Medical): No    Lack of Transportation (Non-Medical): No  Physical Activity: Sufficiently Active (09/05/2021)   Exercise Vital Sign    Days of Exercise per Week: 5 days    Minutes of Exercise per Session: 30 min  Stress: No Stress Concern Present (09/05/2021)   [employer] of Occupational Health - Occupational Stress Questionnaire    Feeling of Stress : Not at all  Social Connections: Socially Integrated (09/05/2021)   Social Connection and Isolation Panel [NHANES]    Frequency of Communication with Friends and Family: More than three times a week    Frequency of Social Gatherings with Friends and Family: More than three times a week    Attends Religious Services: More than 4 times per year    Active Member of [employer] or Organizations: Yes    Attends Engineer, structural: More than 4 times per year    Marital Status: Married  Catering manager Violence: Not At Risk (09/05/2021)   Humiliation, Afraid, Rape, and Kick questionnaire    Fear of Current or Ex-Partner: No    Emotionally Abused: No    Physically Abused: No    Sexually Abused: No    Family History  Problem Relation Age of Onset   Diabetes Sister    Diabetes Brother    Diabetes Sister    Cancer Sister        breast     Review of Systems:  As stated in the HPI and otherwise negative.   BP 134/72   Pulse (!) 52   Ht 5\' 9"  (1.753 m)   Wt 60.5 kg   SpO2 99%   BMI 19.70 kg/m   Physical Examination: General: Well developed, well nourished, NAD  HEENT: OP clear, mucus membranes moist  SKIN: warm, dry. No rashes. Neuro: No focal deficits  Musculoskeletal: Muscle strength 5/5 all ext  Psychiatric: Mood and affect normal  Neck: No JVD, no carotid bruits, no thyromegaly, no lymphadenopathy.  Lungs:Clear bilaterally, no wheezes, rhonci, crackles Cardiovascular: Regular rate and rhythm. Systolic murmur.  Abdomen:Soft. Bowel sounds present. Non-tender.  Extremities: No lower extremity edema. Pulses are 2 + in the bilateral DP/PT.  EKG:  EKG is ordered today. The ekg ordered today demonstrates  EKG Interpretation Date/Time:  Monday April 06 2023 16:01:21 EST Ventricular Rate:  58 PR Interval:  146 QRS Duration:  88 QT Interval:  398 QTC Calculation: 390 R Axis:   -45  Text Interpretation: Sinus bradycardia Left anterior fascicular block Poor R wave progression Confirmed by Verne Carrow [phone]) on 04/06/2023 4:19:07 PM    Recent Labs: 09/26/2022: ALT 25; BUN 9; Creat 0.73; Hemoglobin 12.6; Platelets 254; Potassium 4.3; Sodium 138   Lipid Panel    Component Value Date/Time   CHOL 133 04/11/2022 1141   CHOL 112 11/09/2017 1440   TRIG 59 04/11/2022 1141   HDL 69 04/11/2022 1141   HDL 52 11/09/2017 1440   CHOLHDL 1.9 04/11/2022 1141   VLDL 8 04/06/2021 0340   LDLCALC 50 04/11/2022 1141     Wt Readings from Last 3 Encounters:  04/06/23 60.5 kg  02/25/23 61.9 kg  01/27/23 61.7 kg   Assessment and Plan:   1. CAD without angina: He was admitted with anterior STEMI July 2019. A drug eluting  stent was placed in the LAD. He has done well. No chest pain suggestive of angina. Continue ASA, statin and beta blocker.    2. Ischemic cardiomyopathy: LV function normal by echo January  2023. Continue ARB and beta blocker.   3. Hyperlipidemia: LDL at goal January 2024. Continue statin. Lipids and LFTS today  4. Aortic stenosis: Mild by echo January 2023. Will repeat echo now.    Labs/ tests ordered today include:   Orders Placed This Encounter  Procedures   Lipid panel   Hepatic function panel   EKG 12-Lead   ECHOCARDIOGRAM COMPLETE   Disposition:   F/U with me in 12 months.   Signed, Verne Carrow, MD 04/06/2023 4:31 PM    [facility] [address] [place], [place], [place]  [address] Phone: [phone]; Fax: [phone]

## 2023-04-06 NOTE — Patient Instructions (Signed)
Medication Instructions:  No changes *If you need a refill on your cardiac medications before your next appointment, please call your pharmacy*   Lab Work: Go the the [employer] on first floor blood work today: lipids/liver fx   Testing/Procedures: Your physician has requested that you have an echocardiogram. Echocardiography is a painless test that uses sound waves to create images of your heart. It provides your doctor with information about the size and shape of your heart and how well your heart's chambers and valves are working. This procedure takes approximately one hour. There are no restrictions for this procedure. Please do NOT wear cologne, perfume, aftershave, or lotions (deodorant is allowed). Please arrive 15 minutes prior to your appointment time.  Please note: We ask at that you not bring children with you during ultrasound (echo/ vascular) testing. Due to room size and safety concerns, children are not allowed in the ultrasound rooms during exams. Our front office staff cannot provide observation of children in our lobby area while testing is being conducted. An adult accompanying a patient to their appointment will only be allowed in the ultrasound room at the discretion of the ultrasound technician under special circumstances. We apologize for any inconvenience.   Follow-Up: At [facility], you and your health needs are our priority.  As part of our continuing mission to provide you with exceptional heart care, we have created designated Provider Care Teams.  These Care Teams include your primary Cardiologist (physician) and Advanced Practice Providers (APPs -  Physician Assistants and Nurse Practitioners) who all work together to provide you with the care you need, when you need it.   Your next appointment:   12 month(s)  Provider:   Verne Carrow, MD       1st Floor: - Lobby - Registration  - Pharmacy  - Lab - Cafe  2nd Floor: - PV Lab -  Diagnostic Testing (echo, CT, nuclear med)  3rd Floor: - Vacant  4th Floor: - TCTS (cardiothoracic surgery) - AFib Clinic - Structural Heart Clinic - Vascular Surgery  - Vascular Ultrasound  5th Floor: - [facility] Cardiology (general and EP) - Clinical Pharmacy for coumadin, hypertension, lipid, weight-loss medications, and med management appointments    Valet parking services will be available as well.

## 2023-04-07 ENCOUNTER — Other Ambulatory Visit (HOSPITAL_BASED_OUTPATIENT_CLINIC_OR_DEPARTMENT_OTHER): Payer: Self-pay

## 2023-04-07 ENCOUNTER — Other Ambulatory Visit (HOSPITAL_COMMUNITY): Payer: Self-pay

## 2023-04-07 DIAGNOSIS — H353211 Exudative age-related macular degeneration, right eye, with active choroidal neovascularization: Secondary | ICD-10-CM | POA: Diagnosis not present

## 2023-04-07 DIAGNOSIS — H401130 Primary open-angle glaucoma, bilateral, stage unspecified: Secondary | ICD-10-CM | POA: Diagnosis not present

## 2023-04-07 DIAGNOSIS — H353114 Nonexudative age-related macular degeneration, right eye, advanced atrophic with subfoveal involvement: Secondary | ICD-10-CM | POA: Diagnosis not present

## 2023-04-07 DIAGNOSIS — H35371 Puckering of macula, right eye: Secondary | ICD-10-CM | POA: Diagnosis not present

## 2023-04-07 DIAGNOSIS — H353124 Nonexudative age-related macular degeneration, left eye, advanced atrophic with subfoveal involvement: Secondary | ICD-10-CM | POA: Diagnosis not present

## 2023-04-07 LAB — LIPID PANEL
Chol/HDL Ratio: 2.1 {ratio} (ref 0.0–5.0)
Cholesterol, Total: 129 mg/dL (ref 100–199)
HDL: 61 mg/dL (ref >39)
LDL Chol Calc (NIH): 57 mg/dL (ref 0–99)
Triglycerides: 47 mg/dL (ref 0–149)
VLDL Cholesterol Cal: 11 mg/dL (ref 5–40)

## 2023-04-07 LAB — HEPATIC FUNCTION PANEL
ALT: 20 [IU]/L (ref 0–44)
AST: 25 [IU]/L (ref 0–40)
Albumin: 4 g/dL (ref 3.6–4.6)
Alkaline Phosphatase: 97 [IU]/L (ref 44–121)
Bilirubin Total: 0.7 mg/dL (ref 0.0–1.2)
Bilirubin, Direct: 0.25 mg/dL (ref 0.00–0.40)
Total Protein: 6.1 g/dL (ref 6.0–8.5)

## 2023-04-07 MED ORDER — CARVEDILOL 3.125 MG PO TABS
3.1250 mg | ORAL_TABLET | Freq: Two times a day (BID) | ORAL | 3 refills | Status: DC
  Filled 2023-04-07 – 2023-05-02 (×2): qty 180, 90d supply, fill #0
  Filled 2023-07-06 – 2023-07-29 (×2): qty 180, 90d supply, fill #1
  Filled 2023-10-29: qty 180, 90d supply, fill #2
  Filled 2024-01-15: qty 180, 90d supply, fill #3

## 2023-04-07 MED ORDER — LOSARTAN POTASSIUM 25 MG PO TABS
25.0000 mg | ORAL_TABLET | Freq: Every day | ORAL | 3 refills | Status: DC
  Filled 2023-04-07 – 2023-05-02 (×2): qty 90, 90d supply, fill #0
  Filled 2023-07-29: qty 90, 90d supply, fill #1
  Filled 2023-10-29: qty 90, 90d supply, fill #2
  Filled 2024-01-15: qty 90, 90d supply, fill #3

## 2023-04-07 NOTE — Addendum Note (Signed)
Addended by: Margaret Pyle D on: 04/07/2023 03:32 PM   Modules accepted: Orders

## 2023-04-14 ENCOUNTER — Ambulatory Visit: Payer: PPO | Admitting: Physician Assistant

## 2023-04-29 ENCOUNTER — Ambulatory Visit (HOSPITAL_COMMUNITY): Payer: PPO | Attending: Internal Medicine

## 2023-04-29 DIAGNOSIS — I350 Nonrheumatic aortic (valve) stenosis: Secondary | ICD-10-CM | POA: Diagnosis not present

## 2023-04-29 LAB — ECHOCARDIOGRAM COMPLETE
AR max vel: 1.12 cm2
AV Area VTI: 1.04 cm2
AV Area mean vel: 1.31 cm2
AV Mean grad: 20 mm[Hg]
AV Peak grad: 36.2 mm[Hg]
Ao pk vel: 3.01 m/s
Area-P 1/2: 2.97 cm2
S' Lateral: 2.4 cm

## 2023-05-02 ENCOUNTER — Other Ambulatory Visit (HOSPITAL_COMMUNITY): Payer: Self-pay

## 2023-05-04 ENCOUNTER — Other Ambulatory Visit: Payer: Self-pay

## 2023-05-04 ENCOUNTER — Other Ambulatory Visit (HOSPITAL_COMMUNITY): Payer: Self-pay

## 2023-05-05 ENCOUNTER — Other Ambulatory Visit (HOSPITAL_COMMUNITY): Payer: Self-pay

## 2023-05-08 ENCOUNTER — Other Ambulatory Visit (HOSPITAL_COMMUNITY): Payer: Self-pay

## 2023-05-10 ENCOUNTER — Encounter: Payer: Self-pay | Admitting: Cardiovascular Disease

## 2023-05-12 DIAGNOSIS — H401130 Primary open-angle glaucoma, bilateral, stage unspecified: Secondary | ICD-10-CM | POA: Diagnosis not present

## 2023-05-12 DIAGNOSIS — H353114 Nonexudative age-related macular degeneration, right eye, advanced atrophic with subfoveal involvement: Secondary | ICD-10-CM | POA: Diagnosis not present

## 2023-05-12 DIAGNOSIS — H35371 Puckering of macula, right eye: Secondary | ICD-10-CM | POA: Diagnosis not present

## 2023-05-12 DIAGNOSIS — H353124 Nonexudative age-related macular degeneration, left eye, advanced atrophic with subfoveal involvement: Secondary | ICD-10-CM | POA: Diagnosis not present

## 2023-05-12 DIAGNOSIS — H353211 Exudative age-related macular degeneration, right eye, with active choroidal neovascularization: Secondary | ICD-10-CM | POA: Diagnosis not present

## 2023-06-23 ENCOUNTER — Ambulatory Visit (INDEPENDENT_AMBULATORY_CARE_PROVIDER_SITE_OTHER): Admitting: Family Medicine

## 2023-06-23 ENCOUNTER — Encounter: Payer: Self-pay | Admitting: Family Medicine

## 2023-06-23 VITALS — BP 142/70 | HR 68 | Ht 69.0 in | Wt 136.0 lb

## 2023-06-23 DIAGNOSIS — M545 Low back pain, unspecified: Secondary | ICD-10-CM | POA: Diagnosis not present

## 2023-06-23 DIAGNOSIS — S161XXA Strain of muscle, fascia and tendon at neck level, initial encounter: Secondary | ICD-10-CM

## 2023-06-23 MED ORDER — PREDNISONE 20 MG PO TABS: ORAL_TABLET | ORAL | 0 refills | Status: DC

## 2023-06-23 MED ORDER — TIZANIDINE HCL 2 MG PO CAPS: 2.0000 mg | ORAL_CAPSULE | Freq: Three times a day (TID) | ORAL | 0 refills | Status: DC | PRN

## 2023-06-23 NOTE — Progress Notes (Signed)
 Subjective:    Patient ID: Eugene [name], male    DOB: 1931-05-02, 89 y.o.   MRN: [id number]  Patients wife is currently on hospice.  Her reports 1 week of pain in the right trapezius muscle.  Has pain with rom in the neck.  Denies pain radiating down the arm .  Denies numbness or weakness in the arm. No pain in cervical spinous processes.   Also has low back pain in the lower left flank.  Denies sciatica.  No tenderness over spinous process or iliac crest.    Past Medical History:  Diagnosis Date   Amputated right leg (HCC)    CAD (coronary artery disease)    a. 09/2017 - acute MI -  emergent cath showing 99% stenosis in midLAD with thrombotic stenosis at the bifurcation of the second diagonal, which was relatively small in diameter. He underwent placement of DES to midLAD which jailed the second diagonal.    Chronic combined systolic and diastolic heart failure (HCC)    Collagenous colitis    Dr. Darlyn Read   History of ST elevation myocardial infarction (STEMI)    09/2017: LAD   History of stomach ulcers    Hyperlipidemia LDL goal <70    Ischemic cardiomyopathy    Squamous cell carcinoma of skin 03/27/2014   in situ-left temple (txpbx)   Squamous cell carcinoma of skin 02/24/2018   in situ-left cheek (CX35FU)   Past Surgical History:  Procedure Laterality Date   BACK SURGERY     CARDIAC CATHETERIZATION     CORONARY STENT INTERVENTION N/A 10/07/2017   Procedure: CORONARY STENT INTERVENTION;  Surgeon: Iran Ouch, MD;  Location: [facility];  Service: Cardiovascular;  Laterality: N/A;   CORONARY/GRAFT ACUTE MI REVASCULARIZATION N/A 10/07/2017   Procedure: Coronary/Graft Acute MI Revascularization;  Surgeon: Iran Ouch, MD;  Location: [facility];  Service: Cardiovascular;  Laterality: N/A;   LEFT HEART CATH AND CORONARY ANGIOGRAPHY N/A 10/07/2017   Procedure: LEFT HEART CATH AND CORONARY ANGIOGRAPHY;  Surgeon: Iran Ouch, MD;  Location: [facility];  Service: Cardiovascular;  Laterality: N/A;   LEG AMPUTATION     Right Below knee   STOMACH SURGERY     Billroth II gastrojejunostomy   TOTAL KNEE ARTHROPLASTY     Current Outpatient Medications on File Prior to Visit  Medication Sig Dispense Refill   ALPRAZolam (XANAX) 0.5 MG tablet Take 1 tablet (0.5 mg total) by mouth at bedtime as needed. for sleep 30 tablet 3   aspirin 81 MG tablet Take 81 mg by mouth daily.     budesonide (ENTOCORT EC) 3 MG 24 hr capsule Take 2 capsules (6 mg total) by mouth daily. 180 capsule 3   carvedilol (COREG) 3.125 MG tablet Take 1 tablet (3.125 mg total) by mouth 2 (two) times daily with a meal. 180 tablet 3   cholestyramine (QUESTRAN) 4 GM/DOSE powder Take 1 packet (4 g total) by mouth daily as needed (for colitis flares (mix and drink)). 368.76 g 1   dorzolamide (TRUSOPT) 2 % ophthalmic solution Place 1 drop into both eyes 2 (two) times daily. 30 mL 3   fluorouracil (EFUDEX) 5 % cream Apply 1 application topically daily as needed (as directed- affected sites).     losartan (COZAAR) 25 MG tablet Take 1 tablet (25 mg total) by mouth daily. 90 tablet 3   mometasone (ELOCON) 0.1 % cream Apply topically daily. 45 g 1   Multiple Vitamins-Minerals (  PRESERVISION AREDS PO) Take 1 capsule by mouth 2 (two) times daily.     nitroGLYCERIN (NITROSTAT) 0.4 MG SL tablet Place 1 tablet (0.4 mg total) under the tongue every 5 (five) minutes as needed for chest pain. 25 tablet 3   Omega-3 Fatty Acids ([employer] FISH OIL) 1200 MG CAPS Take 1,200 mg by mouth daily with breakfast.     rosuvastatin (CRESTOR) 20 MG tablet Take 1 tablet (20 mg total) by mouth daily. 90 tablet 3   No current facility-administered medications on file prior to visit.   Allergies  Allergen Reactions   Aleve [Naproxen Sodium] Nausea And Vomiting and Other (See Comments)    No problem with other NSAIDs   Amoxicillin Other (See Comments)    Vertigo Did it involve swelling of the face/tongue/throat,  SOB, or low BP? No Did it involve sudden or severe rash/hives, skin peeling, or any reaction on the inside of your mouth or nose? No Did you need to seek medical attention at a hospital or doctor's office? unknown When did it last happen?   yrs ago    dizziness If all above answers are "NO", may proceed with cephalosporin use.    Gabapentin Other (See Comments)    Might be sensitive to this- took 100 mg twice within a couple of hours = Ended up falling from a standing position and had to be taken to the E.D.   Morphine And Codeine Other (See Comments)    Dizziness    Penicillins Diarrhea and Other (See Comments)    Diarrhea Did it involve swelling of the face/tongue/throat, SOB, or low BP? No Did it involve sudden or severe rash/hives, skin peeling, or any reaction on the inside of your mouth or nose? No Did you need to seek medical attention at a hospital or doctor's office? Unknown When did it last happen?    years ago   If all above answers are "NO", may proceed with cephalosporin use.    Social History   Socioeconomic History   Marital status: Married    Spouse name: Sport and exercise psychologist   Number of children: Not on file   Years of education: 12   Highest education level: High school graduate  Occupational History   Occupation: Retired  Tobacco Use   Smoking status: Former    Current packs/day: 1.00    Average packs/day: 1 pack/day for 8.0 years (8.0 ttl pk-yrs)    Types: Cigarettes   Smokeless tobacco: Never  Vaping Use   Vaping status: Never Used  Substance and Sexual Activity   Alcohol use: Yes    Comment: occasional   Drug use: No   Sexual activity: Yes    Comment: married  Other Topics Concern   Not on file  Social History Narrative   Not on file   Social Drivers of Health   Financial Resource Strain: Low Risk  (09/05/2021)   Overall Financial Resource Strain (CARDIA)    Difficulty of Paying Living Expenses: Not hard at all  Food Insecurity: No Food Insecurity  (09/05/2021)   Hunger Vital Sign    Worried About Running Out of Food in the Last Year: Never true    Ran Out of Food in the Last Year: Never true  Transportation Needs: No Transportation Needs (09/05/2021)   PRAPARE - Administrator, Civil Service (Medical): No    Lack of Transportation (Non-Medical): No  Physical Activity: Sufficiently Active (09/05/2021)   Exercise Vital Sign    Days of Exercise  per Week: 5 days    Minutes of Exercise per Session: 30 min  Stress: No Stress Concern Present (09/05/2021)   [employer] of Occupational Health - Occupational Stress Questionnaire    Feeling of Stress : Not at all  Social Connections: Socially Integrated (09/05/2021)   Social Connection and Isolation Panel [NHANES]    Frequency of Communication with Friends and Family: More than three times a week    Frequency of Social Gatherings with Friends and Family: More than three times a week    Attends Religious Services: More than 4 times per year    Active Member of [employer] or Organizations: Yes    Attends Banker Meetings: More than 4 times per year    Marital Status: Married  Catering manager Violence: Not At Risk (09/05/2021)   Humiliation, Afraid, Rape, and Kick questionnaire    Fear of Current or Ex-Partner: No    Emotionally Abused: No    Physically Abused: No    Sexually Abused: No   Review of Systems  All other systems reviewed and are negative.      Objective:   Physical Exam Vitals reviewed.  Constitutional:      Appearance: Normal appearance. He is normal weight.  Neck:   Cardiovascular:     Rate and Rhythm: Normal rate and regular rhythm.     Heart sounds: Normal heart sounds.  Pulmonary:     Effort: Pulmonary effort is normal. No respiratory distress.     Breath sounds: Normal breath sounds. No wheezing or rales.  Musculoskeletal:     Cervical back: No signs of trauma. Pain with movement and muscular tenderness present. No spinous process  tenderness. Decreased range of motion.     Lumbar back: No swelling, deformity, signs of trauma, spasms or bony tenderness. Decreased range of motion.     Left lower leg: Edema present.       Legs:  Lymphadenopathy:     Cervical: No cervical adenopathy.  Neurological:     Mental Status: He is alert.       Assessment & Plan:  Strain of neck muscle, initial encounter  Acute left-sided low back pain without sciatica  I believe the patient pulled a muscle in his neck.  Begin prednisone taper pack.  Use tizanidine 2 mg every 8 hours as needed for muscle pain sparingly and under the supervision of his daughter.  Cautioned them about possible confusion on muscle relaxer.  I believe the back pain is likely due to degenerative disc disease in lumbar spine.  Will try to calm pain with prednisone as well.

## 2023-07-06 ENCOUNTER — Other Ambulatory Visit (HOSPITAL_COMMUNITY): Payer: Self-pay

## 2023-07-07 DIAGNOSIS — H35371 Puckering of macula, right eye: Secondary | ICD-10-CM | POA: Diagnosis not present

## 2023-07-07 DIAGNOSIS — H401130 Primary open-angle glaucoma, bilateral, stage unspecified: Secondary | ICD-10-CM | POA: Diagnosis not present

## 2023-07-07 DIAGNOSIS — H353211 Exudative age-related macular degeneration, right eye, with active choroidal neovascularization: Secondary | ICD-10-CM | POA: Diagnosis not present

## 2023-07-07 DIAGNOSIS — H353114 Nonexudative age-related macular degeneration, right eye, advanced atrophic with subfoveal involvement: Secondary | ICD-10-CM | POA: Diagnosis not present

## 2023-07-07 DIAGNOSIS — H353124 Nonexudative age-related macular degeneration, left eye, advanced atrophic with subfoveal involvement: Secondary | ICD-10-CM | POA: Diagnosis not present

## 2023-07-13 ENCOUNTER — Other Ambulatory Visit: Payer: Self-pay

## 2023-07-20 ENCOUNTER — Other Ambulatory Visit: Payer: Self-pay

## 2023-07-21 ENCOUNTER — Other Ambulatory Visit: Payer: Self-pay | Admitting: Family Medicine

## 2023-07-21 ENCOUNTER — Other Ambulatory Visit (HOSPITAL_COMMUNITY): Payer: Self-pay

## 2023-07-21 NOTE — Telephone Encounter (Signed)
 Copied from CRM [id number]. Topic: Clinical - Medication Refill >> Jul 21, 2023  8:18 AM Jayson Michael wrote: Most Recent Primary Care Visit:  Provider: Eliane Grooms T  Department: [facility]  Visit Type: ACUTE  Date: 06/23/2023  Medication: Multiple Vitamins-Minerals (PRESERVISION AREDS PO) Medication is OTC but needs prescription for mail order delivery    Has the patient contacted their pharmacy? Yes   Is this the correct pharmacy for this prescription? Yes If no, delete pharmacy and type the correct one.  This is the patient's preferred pharmacy:   They stated it needs to be the mail order outpatient  [facility] [address] [place] [address] Phone: [phone] Fax: [phone]   Has the prescription been filled recently? No  Is the patient out of the medication? Yes-   Has the patient been seen for an appointment in the last year OR does the patient have an upcoming appointment? Yes  Can we respond through MyChart? Yes  Agent: Please be advised that Rx refills may take up to 3 business days. We ask that you follow-up with your pharmacy.

## 2023-07-22 NOTE — Telephone Encounter (Signed)
 Requested medication (s) are due for refill today: yes  Requested medication (s) are on the active medication list: historical meds  Last refill:  10/25/18  Future visit scheduled: no  Notes to clinic:  no protocol attached to med   Requested Prescriptions  Pending Prescriptions Disp Refills   Multiple Vitamins-Minerals (PRESERVISION AREDS) CAPS      Sig: Take by mouth 2 (two) times daily.     There is no refill protocol information for this order

## 2023-07-27 ENCOUNTER — Other Ambulatory Visit: Payer: Self-pay

## 2023-07-27 ENCOUNTER — Other Ambulatory Visit (HOSPITAL_COMMUNITY): Payer: Self-pay

## 2023-07-27 ENCOUNTER — Encounter: Payer: Self-pay | Admitting: Family Medicine

## 2023-07-27 DIAGNOSIS — H539 Unspecified visual disturbance: Secondary | ICD-10-CM

## 2023-07-27 MED ORDER — PRESERVISION AREDS PO CAPS
1.0000 | ORAL_CAPSULE | Freq: Two times a day (BID) | ORAL | 3 refills | Status: AC
  Filled 2023-07-27: qty 180, 90d supply, fill #0
  Filled 2023-07-29 – 2023-08-04 (×2): qty 240, 120d supply, fill #0
  Filled 2023-08-06: qty 120, 30d supply, fill #0
  Filled 2023-09-09: qty 120, 30d supply, fill #1
  Filled 2023-10-20: qty 180, 45d supply, fill #2
  Filled 2023-11-22: qty 120, 30d supply, fill #3
  Filled 2023-12-22: qty 120, 30d supply, fill #4
  Filled 2024-01-15: qty 120, 30d supply, fill #5

## 2023-07-29 ENCOUNTER — Other Ambulatory Visit: Payer: Self-pay

## 2023-07-29 ENCOUNTER — Other Ambulatory Visit (HOSPITAL_COMMUNITY): Payer: Self-pay

## 2023-08-04 ENCOUNTER — Other Ambulatory Visit (HOSPITAL_COMMUNITY): Payer: Self-pay

## 2023-08-04 ENCOUNTER — Other Ambulatory Visit: Payer: Self-pay

## 2023-08-07 ENCOUNTER — Other Ambulatory Visit: Payer: Self-pay

## 2023-08-07 ENCOUNTER — Other Ambulatory Visit (HOSPITAL_COMMUNITY): Payer: Self-pay

## 2023-08-11 ENCOUNTER — Other Ambulatory Visit: Payer: Self-pay

## 2023-08-11 ENCOUNTER — Ambulatory Visit: Payer: Self-pay | Admitting: *Deleted

## 2023-08-11 NOTE — Telephone Encounter (Signed)
 Appt. Scheduled for 05/28/205

## 2023-08-11 NOTE — Telephone Encounter (Signed)
 Copied from CRM [id number]. Topic: Clinical - Red Word Triage >> Aug 11, 2023  8:36 AM Rosamond Comes wrote: Red Word that prompted transfer to Nurse Triage: patient calling, fell about a month ago, scraped his left elbow, elbow is not red and swollen, warm to touch, Reason for Disposition  [1] MODERATE pain (e.g., interferes with normal activities) AND [2] present > 3 days  Answer Assessment - Initial Assessment Questions 1. ONSET: "When did the pain start?"     I fell a month ago and injured my left elbow.  It healed up.    But now it's sore, red and swollen. 2. LOCATION: "Where is the pain located?"     Left elbow Started a couple of days ago.   3. PAIN: "How bad is the pain?" (Scale 1-10; or mild, moderate, severe)   - MILD (1-3): doesn't interfere with normal activities.   - MODERATE (4-7): interferes with normal activities (e.g., work or school) or awakens from sleep.   - SEVERE (8-10): excruciating pain, unable to do any normal activities, unable to use arm at all.     Moderate Can bend it and use it but it's sore and swollen 4. WORK OR EXERCISE: "Has there been any recent work or exercise that involved this part of the body?"     No 5. CAUSE: "What do you think is causing the elbow pain?"     I fell and hurt it a month ago but it healed up and was fine until a couple of days ago it started hurting and is swollen. 6. OTHER SYMPTOMS: "Do you have any other symptoms?" (e.g., neck pain, elbow swelling, rash, fever)     Red and swollen 7. PREGNANCY: "Is there any chance you are pregnant?" "When was your last menstrual period?"     N/A  Protocols used: Elbow Pain-A-AH  Chief Complaint: Left elbow pain from a fall a month ago.   It healed up but now has become sore. Symptoms: Soreness, swelling.  Frequency: Started a couple of days ago Pertinent Negatives: Patient denies open wounds or repeat injuries Disposition: [] ED /[] Urgent Care (no appt availability in office) / [x] Appointment(In  office/virtual)/ []  [facility]/ [] Home Care/ [] Refused Recommended Disposition /[] [facility]/ []  Follow-up with PCP Additional Notes: Appt made for today with Yolanda Hence, NP for today at 12:00.   Pt only has transportation between 10-2:00.   He is going to call back and cancel if his care giver can't bring him in today between those hours.   I encouraged him to try and make the appt.

## 2023-08-12 ENCOUNTER — Ambulatory Visit (INDEPENDENT_AMBULATORY_CARE_PROVIDER_SITE_OTHER): Admitting: Family Medicine

## 2023-08-12 ENCOUNTER — Encounter: Payer: Self-pay | Admitting: Family Medicine

## 2023-08-12 VITALS — BP 138/60 | HR 65 | Temp 97.3°F | Ht 69.0 in | Wt 136.4 lb

## 2023-08-12 DIAGNOSIS — M25522 Pain in left elbow: Secondary | ICD-10-CM

## 2023-08-12 DIAGNOSIS — W19XXXS Unspecified fall, sequela: Secondary | ICD-10-CM

## 2023-08-12 DIAGNOSIS — W19XXXA Unspecified fall, initial encounter: Secondary | ICD-10-CM | POA: Insufficient documentation

## 2023-08-12 NOTE — Assessment & Plan Note (Signed)
 Minor abrasion sustain during past fall that is well healing without drainage, redness, warmth, or swelling. Son expressed concerns for gout however no s/s present on exam. Encouraged to return to office if symptoms return.

## 2023-08-12 NOTE — Progress Notes (Signed)
 Subjective:  HPI: Eugene [name] is a 89 y.o. male presenting on 08/12/2023 for Injury (/Left elbow is red, swollen and sore from a fall a month ago /)   HPI Patient is in today for left elbow injury resulting from a fall 1 month ago accompanied by his son. Today he reports his symptoms have resolved. Eugene [name] reports his left elbow was previously swollen and sore. Denies redness and warmth. No other injuries sustained. Today his elbow pain and swelling is absent on exam. No limited ROM, numbness, tingling.   Review of Systems  All other systems reviewed and are negative.   Relevant past medical history reviewed and updated as indicated.   Past Medical History:  Diagnosis Date   Amputated right leg (HCC)    CAD (coronary artery disease)    a. 09/2017 - acute MI -  emergent cath showing 99% stenosis in midLAD with thrombotic stenosis at the bifurcation of the second diagonal, which was relatively small in diameter. He underwent placement of DES to midLAD which jailed the second diagonal.    Chronic combined systolic and diastolic heart failure (HCC)    Collagenous colitis    Dr. Erik Havens   History of ST elevation myocardial infarction (STEMI)    09/2017: LAD   History of stomach ulcers    Hyperlipidemia LDL goal <70    Ischemic cardiomyopathy    Squamous cell carcinoma of skin 03/27/2014   in situ-left temple (txpbx)   Squamous cell carcinoma of skin 02/24/2018   in situ-left cheek (CX35FU)     Past Surgical History:  Procedure Laterality Date   BACK SURGERY     CARDIAC CATHETERIZATION     CORONARY STENT INTERVENTION N/A 10/07/2017   Procedure: CORONARY STENT INTERVENTION;  Surgeon: Wenona Hamilton, MD;  Location: [facility];  Service: Cardiovascular;  Laterality: N/A;   CORONARY/GRAFT ACUTE MI REVASCULARIZATION N/A 10/07/2017   Procedure: Coronary/Graft Acute MI Revascularization;  Surgeon: Wenona Hamilton, MD;  Location: [facility];  Service:  Cardiovascular;  Laterality: N/A;   LEFT HEART CATH AND CORONARY ANGIOGRAPHY N/A 10/07/2017   Procedure: LEFT HEART CATH AND CORONARY ANGIOGRAPHY;  Surgeon: Wenona Hamilton, MD;  Location: [facility];  Service: Cardiovascular;  Laterality: N/A;   LEG AMPUTATION     Right Below knee   STOMACH SURGERY     Billroth II gastrojejunostomy   TOTAL KNEE ARTHROPLASTY      Allergies and medications reviewed and updated.   Current Outpatient Medications:    ALPRAZolam  (XANAX ) 0.5 MG tablet, Take 1 tablet (0.5 mg total) by mouth at bedtime as needed. for sleep, Disp: 30 tablet, Rfl: 3   aspirin  81 MG tablet, Take 81 mg by mouth daily., Disp: , Rfl:    budesonide  (ENTOCORT EC ) 3 MG 24 hr capsule, Take 2 capsules (6 mg total) by mouth daily., Disp: 180 capsule, Rfl: 3   carvedilol  (COREG ) 3.125 MG tablet, Take 1 tablet (3.125 mg total) by mouth 2 (two) times daily with a meal., Disp: 180 tablet, Rfl: 3   cholestyramine  (QUESTRAN ) 4 GM/DOSE powder, Take 1 packet (4 g total) by mouth daily as needed (for colitis flares (mix and drink))., Disp: 368.76 g, Rfl: 1   dorzolamide  (TRUSOPT ) 2 % ophthalmic solution, Place 1 drop into both eyes 2 (two) times daily., Disp: 30 mL, Rfl: 3   losartan  (COZAAR ) 25 MG tablet, Take 1 tablet (25 mg total) by mouth daily., Disp: 90 tablet, Rfl:  3   Multiple Vitamins-Minerals (PRESERVISION AREDS) CAPS, Take 1 capsule by mouth 2 (two) times daily., Disp: 180 capsule, Rfl: 3   nitroGLYCERIN  (NITROSTAT ) 0.4 MG SL tablet, Place 1 tablet (0.4 mg total) under the tongue every 5 (five) minutes as needed for chest pain., Disp: 25 tablet, Rfl: 3   Omega-3 Fatty Acids  ([employer] FISH OIL) 1200 MG CAPS, Take 1,200 mg by mouth daily with breakfast., Disp: , Rfl:    rosuvastatin  (CRESTOR ) 20 MG tablet, Take 1 tablet (20 mg total) by mouth daily., Disp: 90 tablet, Rfl: 3  Allergies  Allergen Reactions   Aleve [Naproxen Sodium] Nausea And Vomiting and Other (See Comments)    No  problem with other NSAIDs   Amoxicillin Other (See Comments)    Vertigo Did it involve swelling of the face/tongue/throat, SOB, or low BP? No Did it involve sudden or severe rash/hives, skin peeling, or any reaction on the inside of your mouth or nose? No Did you need to seek medical attention at a hospital or doctor's office? unknown When did it last happen?   yrs ago    dizziness If all above answers are "NO", may proceed with cephalosporin use.    Gabapentin  Other (See Comments)    Might be sensitive to this- took 100 mg twice within a couple of hours = Ended up falling from a standing position and had to be taken to the E.D.   Morphine And Codeine Other (See Comments)    Dizziness    Penicillins Diarrhea and Other (See Comments)    Diarrhea Did it involve swelling of the face/tongue/throat, SOB, or low BP? No Did it involve sudden or severe rash/hives, skin peeling, or any reaction on the inside of your mouth or nose? No Did you need to seek medical attention at a hospital or doctor's office? Unknown When did it last happen?    years ago   If all above answers are "NO", may proceed with cephalosporin use.     Objective:   BP 138/60   Pulse 65   Temp (!) 97.3 F (36.3 C)   Ht 5\' 9"  (1.753 m)   Wt 136 lb 6.4 oz (61.9 kg)   BMI 20.14 kg/m      08/12/2023   11:46 AM 06/23/2023   11:06 AM 04/06/2023    3:58 PM  Vitals with BMI  Height 5\' 9"  5\' 9"  5\' 9"   Weight 136 lbs 6 oz 136 lbs 133 lbs 6 oz  BMI 20.13 20.07 19.69  Systolic 138 142 865  Diastolic 60 70 72  Pulse 65 68 52     Physical Exam Vitals and nursing note reviewed.  Constitutional:      Appearance: Normal appearance. He is normal weight.  HENT:     Head: Normocephalic and atraumatic.  Skin:    General: Skin is warm and dry.     Capillary Refill: Capillary refill takes less than 2 seconds.     Findings: Abrasion present.          Comments: Well healing minor abrasion to left elbow  Neurological:      General: No focal deficit present.     Mental Status: He is alert and oriented to person, place, and time. Mental status is at baseline.  Psychiatric:        Mood and Affect: Mood normal.        Behavior: Behavior normal.        Thought Content: Thought content normal.  Judgment: Judgment normal.     Assessment & Plan:  Fall, sequela Assessment & Plan: Minor abrasion sustain during past fall that is well healing without drainage, redness, warmth, or swelling. Son expressed concerns for gout however no s/s present on exam. Encouraged to return to office if symptoms return.      Follow up plan: Return if symptoms worsen or fail to improve.  Jenelle Mis, FNP

## 2023-08-13 DIAGNOSIS — D1801 Hemangioma of skin and subcutaneous tissue: Secondary | ICD-10-CM | POA: Diagnosis not present

## 2023-08-13 DIAGNOSIS — L820 Inflamed seborrheic keratosis: Secondary | ICD-10-CM | POA: Diagnosis not present

## 2023-08-13 DIAGNOSIS — L821 Other seborrheic keratosis: Secondary | ICD-10-CM | POA: Diagnosis not present

## 2023-08-13 DIAGNOSIS — L814 Other melanin hyperpigmentation: Secondary | ICD-10-CM | POA: Diagnosis not present

## 2023-08-13 DIAGNOSIS — D0359 Melanoma in situ of other part of trunk: Secondary | ICD-10-CM | POA: Diagnosis not present

## 2023-08-14 DIAGNOSIS — H353231 Exudative age-related macular degeneration, bilateral, with active choroidal neovascularization: Secondary | ICD-10-CM | POA: Diagnosis not present

## 2023-08-27 ENCOUNTER — Ambulatory Visit: Admitting: Family Medicine

## 2023-08-27 ENCOUNTER — Encounter: Payer: Self-pay | Admitting: Family Medicine

## 2023-08-27 VITALS — BP 137/60 | HR 65 | Temp 97.5°F | Ht 69.0 in | Wt 137.4 lb

## 2023-08-27 DIAGNOSIS — H0012 Chalazion right lower eyelid: Secondary | ICD-10-CM

## 2023-08-27 DIAGNOSIS — R351 Nocturia: Secondary | ICD-10-CM

## 2023-08-27 LAB — URINALYSIS, ROUTINE W REFLEX MICROSCOPIC
Bilirubin Urine: NEGATIVE
Glucose, UA: NEGATIVE
Hgb urine dipstick: NEGATIVE
Ketones, ur: NEGATIVE
Leukocytes,Ua: NEGATIVE
Nitrite: NEGATIVE
Protein, ur: NEGATIVE
Specific Gravity, Urine: 1.015 (ref 1.001–1.035)
pH: 6 (ref 5.0–8.0)

## 2023-08-27 MED ORDER — SULFAMETHOXAZOLE-TRIMETHOPRIM 800-160 MG PO TABS: 1.0000 | ORAL_TABLET | Freq: Two times a day (BID) | ORAL | 0 refills | Status: DC

## 2023-08-27 NOTE — Progress Notes (Signed)
 Subjective:    Patient ID: Eugene [name], male    DOB: 08-23-1931, 89 y.o.   MRN: [id number]  Patient has a 6 mm erythematous tender nodule forming within his right lower eyelid.  The surrounding skin is erythematous and tender and warm.  It appears to be a chalazion.  He states that his only been there for 2 days.  He denies any blurry vision.  He does report urinary frequency mostly at night.  Urinalysis shows no blood, no nitrates, no leukocyte esterase.  He denies any dysuria.  He denies any weak stream or hesitancy.  Prostate exam today is normal and shows no BPH  Past Medical History:  Diagnosis Date   Amputated right leg (HCC)    CAD (coronary artery disease)    a. 09/2017 - acute MI -  emergent cath showing 99% stenosis in midLAD with thrombotic stenosis at the bifurcation of the second diagonal, which was relatively small in diameter. He underwent placement of DES to midLAD which jailed the second diagonal.    Chronic combined systolic and diastolic heart failure (HCC)    Collagenous colitis    Dr. Erik Havens   History of ST elevation myocardial infarction (STEMI)    09/2017: LAD   History of stomach ulcers    Hyperlipidemia LDL goal <70    Ischemic cardiomyopathy    Squamous cell carcinoma of skin 03/27/2014   in situ-left temple (txpbx)   Squamous cell carcinoma of skin 02/24/2018   in situ-left cheek (CX35FU)   Past Surgical History:  Procedure Laterality Date   BACK SURGERY     CARDIAC CATHETERIZATION     CORONARY STENT INTERVENTION N/A 10/07/2017   Procedure: CORONARY STENT INTERVENTION;  Surgeon: Wenona Hamilton, MD;  Location: [facility];  Service: Cardiovascular;  Laterality: N/A;   CORONARY/GRAFT ACUTE MI REVASCULARIZATION N/A 10/07/2017   Procedure: Coronary/Graft Acute MI Revascularization;  Surgeon: Wenona Hamilton, MD;  Location: [facility];  Service: Cardiovascular;  Laterality: N/A;   LEFT HEART CATH AND CORONARY ANGIOGRAPHY N/A 10/07/2017    Procedure: LEFT HEART CATH AND CORONARY ANGIOGRAPHY;  Surgeon: Wenona Hamilton, MD;  Location: [facility];  Service: Cardiovascular;  Laterality: N/A;   LEG AMPUTATION     Right Below knee   STOMACH SURGERY     Billroth II gastrojejunostomy   TOTAL KNEE ARTHROPLASTY     Current Outpatient Medications on File Prior to Visit  Medication Sig Dispense Refill   ALPRAZolam  (XANAX ) 0.5 MG tablet Take 1 tablet (0.5 mg total) by mouth at bedtime as needed. for sleep 30 tablet 3   aspirin  81 MG tablet Take 81 mg by mouth daily.     budesonide  (ENTOCORT EC ) 3 MG 24 hr capsule Take 2 capsules (6 mg total) by mouth daily. 180 capsule 3   carvedilol  (COREG ) 3.125 MG tablet Take 1 tablet (3.125 mg total) by mouth 2 (two) times daily with a meal. 180 tablet 3   cholestyramine  (QUESTRAN ) 4 GM/DOSE powder Take 1 packet (4 g total) by mouth daily as needed (for colitis flares (mix and drink)). 368.76 g 1   dorzolamide  (TRUSOPT ) 2 % ophthalmic solution Place 1 drop into both eyes 2 (two) times daily. 30 mL 3   losartan  (COZAAR ) 25 MG tablet Take 1 tablet (25 mg total) by mouth daily. 90 tablet 3   Multiple Vitamins-Minerals (PRESERVISION AREDS) CAPS Take 1 capsule by mouth 2 (two) times daily. 180 capsule 3  nitroGLYCERIN  (NITROSTAT ) 0.4 MG SL tablet Place 1 tablet (0.4 mg total) under the tongue every 5 (five) minutes as needed for chest pain. 25 tablet 3   Omega-3 Fatty Acids  ([employer] FISH OIL) 1200 MG CAPS Take 1,200 mg by mouth daily with breakfast.     rosuvastatin  (CRESTOR ) 20 MG tablet Take 1 tablet (20 mg total) by mouth daily. 90 tablet 3   No current facility-administered medications on file prior to visit.   Allergies  Allergen Reactions   Aleve [Naproxen Sodium] Nausea And Vomiting and Other (See Comments)    No problem with other NSAIDs   Amoxicillin Other (See Comments)    Vertigo Did it involve swelling of the face/tongue/throat, SOB, or low BP? No Did it involve sudden or severe  rash/hives, skin peeling, or any reaction on the inside of your mouth or nose? No Did you need to seek medical attention at a hospital or doctor's office? unknown When did it last happen?   yrs ago    dizziness If all above answers are NO, may proceed with cephalosporin use.    Gabapentin  Other (See Comments)    Might be sensitive to this- took 100 mg twice within a couple of hours = Ended up falling from a standing position and had to be taken to the E.D.   Morphine And Codeine Other (See Comments)    Dizziness    Penicillins Diarrhea and Other (See Comments)    Diarrhea Did it involve swelling of the face/tongue/throat, SOB, or low BP? No Did it involve sudden or severe rash/hives, skin peeling, or any reaction on the inside of your mouth or nose? No Did you need to seek medical attention at a hospital or doctor's office? Unknown When did it last happen?    years ago   If all above answers are NO, may proceed with cephalosporin use.    Social History   Socioeconomic History   Marital status: Married    Spouse name: Sport and exercise psychologist   Number of children: Not on file   Years of education: 12   Highest education level: High school graduate  Occupational History   Occupation: Retired  Tobacco Use   Smoking status: Former    Current packs/day: 1.00    Average packs/day: 1 pack/day for 8.0 years (8.0 ttl pk-yrs)    Types: Cigarettes   Smokeless tobacco: Never  Vaping Use   Vaping status: Never Used  Substance and Sexual Activity   Alcohol use: Yes    Alcohol/week: 3.0 standard drinks of alcohol    Types: 3 Cans of beer per week    Comment: occasional 3-4 beers weekly   Drug use: No   Sexual activity: Yes    Comment: married  Other Topics Concern   Not on file  Social History Narrative   Not on file   Social Drivers of Health   Financial Resource Strain: Low Risk  (09/05/2021)   Overall Financial Resource Strain (CARDIA)    Difficulty of Paying Living Expenses: Not hard at  all  Food Insecurity: No Food Insecurity (09/05/2021)   Hunger Vital Sign    Worried About Running Out of Food in the Last Year: Never true    Ran Out of Food in the Last Year: Never true  Transportation Needs: No Transportation Needs (09/05/2021)   PRAPARE - Administrator, Civil Service (Medical): No    Lack of Transportation (Non-Medical): No  Physical Activity: Sufficiently Active (09/05/2021)   Exercise Vital Sign  Days of Exercise per Week: 5 days    Minutes of Exercise per Session: 30 min  Stress: No Stress Concern Present (09/05/2021)   [employer] of Occupational Health - Occupational Stress Questionnaire    Feeling of Stress : Not at all  Social Connections: Socially Integrated (09/05/2021)   Social Connection and Isolation Panel    Frequency of Communication with Friends and Family: More than three times a week    Frequency of Social Gatherings with Friends and Family: More than three times a week    Attends Religious Services: More than 4 times per year    Active Member of [employer] or Organizations: Yes    Attends Banker Meetings: More than 4 times per year    Marital Status: Married  Catering manager Violence: Not At Risk (09/05/2021)   Humiliation, Afraid, Rape, and Kick questionnaire    Fear of Current or Ex-Partner: No    Emotionally Abused: No    Physically Abused: No    Sexually Abused: No   Review of Systems  All other systems reviewed and are negative.      Objective:   Physical Exam Vitals reviewed.   Eyes:     General:        Right eye: Hordeolum present. No discharge.        Left eye: No discharge.     Conjunctiva/sclera:     Right eye: Right conjunctiva is not injected. No chemosis, exudate or hemorrhage.    Left eye: Left conjunctiva is not injected. No chemosis, exudate or hemorrhage.   Neck:    Cardiovascular:     Rate and Rhythm: Normal rate and regular rhythm.     Heart sounds: Normal heart sounds.  Pulmonary:      Effort: Pulmonary effort is normal. No respiratory distress.     Breath sounds: Normal breath sounds. No wheezing or rales.  Genitourinary:    Prostate: Not enlarged, not tender and no nodules present.   Musculoskeletal:     Cervical back: Muscular tenderness present. No spinous process tenderness.     Right hip: No tenderness or bony tenderness. Normal range of motion.     Left hip: No tenderness or bony tenderness. Normal range of motion.       Assessment & Plan:  Frequent noctunal urination - Plan: Urinalysis, Routine w reflex microscopic  Chalazion of right lower eyelid Begin Bactrim  double strength tablets twice daily for 1 week for possible infected eyelid.  Recommended warm compresses 3-4 times daily.  If not improving, consult ophthalmology for incision and drainage.  Urinalysis is normal.  Suspect overactive bladder versus lower urinary tract symptoms.  At the present time the symptoms are mild and does not warrant treatment

## 2023-09-10 DIAGNOSIS — D0359 Melanoma in situ of other part of trunk: Secondary | ICD-10-CM | POA: Diagnosis not present

## 2023-09-16 DIAGNOSIS — H353211 Exudative age-related macular degeneration, right eye, with active choroidal neovascularization: Secondary | ICD-10-CM | POA: Diagnosis not present

## 2023-09-16 DIAGNOSIS — H353114 Nonexudative age-related macular degeneration, right eye, advanced atrophic with subfoveal involvement: Secondary | ICD-10-CM | POA: Diagnosis not present

## 2023-09-16 DIAGNOSIS — H353124 Nonexudative age-related macular degeneration, left eye, advanced atrophic with subfoveal involvement: Secondary | ICD-10-CM | POA: Diagnosis not present

## 2023-09-16 DIAGNOSIS — H35371 Puckering of macula, right eye: Secondary | ICD-10-CM | POA: Diagnosis not present

## 2023-09-16 DIAGNOSIS — H401130 Primary open-angle glaucoma, bilateral, stage unspecified: Secondary | ICD-10-CM | POA: Diagnosis not present

## 2023-09-24 ENCOUNTER — Other Ambulatory Visit: Payer: Self-pay

## 2023-09-24 ENCOUNTER — Other Ambulatory Visit (HOSPITAL_COMMUNITY): Payer: Self-pay

## 2023-09-25 ENCOUNTER — Other Ambulatory Visit: Payer: Self-pay

## 2023-09-28 ENCOUNTER — Other Ambulatory Visit: Payer: Self-pay

## 2023-09-28 ENCOUNTER — Other Ambulatory Visit (HOSPITAL_COMMUNITY): Payer: Self-pay

## 2023-10-14 ENCOUNTER — Other Ambulatory Visit: Payer: Self-pay

## 2023-10-23 ENCOUNTER — Ambulatory Visit: Payer: Self-pay

## 2023-10-23 ENCOUNTER — Telehealth: Payer: Self-pay

## 2023-10-23 NOTE — Telephone Encounter (Signed)
 Phoned pt to triage and pt states that he already has an appointment, NT asked did pt schedule online and pt states that he spoke with someone and scheduled and did not need triage.          Summary: Lower back pain, difficulty having bowel movements   Lower back pain, difficulty having bowel movements      Reason for Disposition  Caller has already spoken with another triager and has no further questions.  Protocols used: No Contact or Duplicate Contact Call-A-AH

## 2023-10-23 NOTE — Telephone Encounter (Signed)
 Copied from CRM [id number]. Topic: Appointments - Scheduling Inquiry for Clinic >> Oct 23, 2023 11:09 AM Eugene [name] wrote: Reason for CRM: Pt has lower back pain and wants to be worked in with anyone on Tuesday August 12th between 10:00 am and 2:00 pm. Please advise, pt is reliant on transportation from home health.   Best contact: [phone]   ----------------------------------------------------------------------- From previous Reason for Contact - Scheduling: Patient/patient representative is calling to schedule an appointment. Refer to attachments for appointment information.

## 2023-10-27 ENCOUNTER — Other Ambulatory Visit (HOSPITAL_COMMUNITY): Payer: Self-pay

## 2023-10-27 ENCOUNTER — Ambulatory Visit: Admitting: Family Medicine

## 2023-10-27 ENCOUNTER — Other Ambulatory Visit: Payer: Self-pay

## 2023-10-28 ENCOUNTER — Other Ambulatory Visit: Payer: Self-pay

## 2023-11-06 DIAGNOSIS — H353193 Nonexudative age-related macular degeneration, unspecified eye, advanced atrophic without subfoveal involvement: Secondary | ICD-10-CM | POA: Diagnosis not present

## 2023-11-06 DIAGNOSIS — Z961 Presence of intraocular lens: Secondary | ICD-10-CM | POA: Diagnosis not present

## 2023-11-10 ENCOUNTER — Other Ambulatory Visit (HOSPITAL_COMMUNITY): Payer: Self-pay

## 2023-11-10 ENCOUNTER — Encounter: Payer: Self-pay | Admitting: Family Medicine

## 2023-11-10 ENCOUNTER — Ambulatory Visit (INDEPENDENT_AMBULATORY_CARE_PROVIDER_SITE_OTHER): Admitting: Family Medicine

## 2023-11-10 VITALS — BP 112/62 | HR 70 | Temp 97.6°F | Ht 69.0 in | Wt 135.0 lb

## 2023-11-10 DIAGNOSIS — M545 Low back pain, unspecified: Secondary | ICD-10-CM

## 2023-11-10 MED ORDER — PREDNISONE 20 MG PO TABS
ORAL_TABLET | ORAL | 0 refills | Status: AC
  Filled 2023-11-10 (×2): qty 12, 6d supply, fill #0

## 2023-11-10 MED ORDER — TAMSULOSIN HCL 0.4 MG PO CAPS
0.4000 mg | ORAL_CAPSULE | Freq: Every day | ORAL | 3 refills | Status: DC
  Filled 2023-11-10 (×2): qty 30, 30d supply, fill #0
  Filled 2023-12-03: qty 30, 30d supply, fill #1
  Filled 2024-01-05: qty 30, 30d supply, fill #2
  Filled 2024-02-04: qty 30, 30d supply, fill #3

## 2023-11-10 NOTE — Progress Notes (Signed)
 Subjective:    Patient ID: Eugene [name], male    DOB: 09/14/1931, 89 y.o.   MRN: [id number]  Patient reports a 1 week history of low back pain in his right lower back just above his right gluteus muscle.  There is no tenderness to palpation over the lumbar spinous processes.  There is no tenderness to palpation over his lower right or left ribs.  There is no CVA tenderness.  There is no visible rash or swelling.  The pain is located primarily at just above his right gluteus.  He denies any radiation down his right leg into his right thigh.  He denies any numbness or tingling in his right leg.  He denies any dysuria urgency or frequency or fever or chills or hematuria.  He does have chronic nocturia.  He wakes up 4-5 times every night to urinate however this is gone on for a long time.  This does keep him from sleeping.  Past Medical History:  Diagnosis Date   Amputated right leg (HCC)    CAD (coronary artery disease)    a. 09/2017 - acute MI -  emergent cath showing 99% stenosis in midLAD with thrombotic stenosis at the bifurcation of the second diagonal, which was relatively small in diameter. He underwent placement of DES to midLAD which jailed the second diagonal.    Chronic combined systolic and diastolic heart failure (HCC)    Collagenous colitis    Dr. Milana   History of ST elevation myocardial infarction (STEMI)    09/2017: LAD   History of stomach ulcers    Hyperlipidemia LDL goal <70    Ischemic cardiomyopathy    Squamous cell carcinoma of skin 03/27/2014   in situ-left temple (txpbx)   Squamous cell carcinoma of skin 02/24/2018   in situ-left cheek (CX35FU)   Past Surgical History:  Procedure Laterality Date   BACK SURGERY     CARDIAC CATHETERIZATION     CORONARY STENT INTERVENTION N/A 10/07/2017   Procedure: CORONARY STENT INTERVENTION;  Surgeon: Darron Deatrice LABOR, MD;  Location: [facility];  Service: Cardiovascular;  Laterality: N/A;   CORONARY/GRAFT ACUTE MI  REVASCULARIZATION N/A 10/07/2017   Procedure: Coronary/Graft Acute MI Revascularization;  Surgeon: Darron Deatrice LABOR, MD;  Location: [facility];  Service: Cardiovascular;  Laterality: N/A;   LEFT HEART CATH AND CORONARY ANGIOGRAPHY N/A 10/07/2017   Procedure: LEFT HEART CATH AND CORONARY ANGIOGRAPHY;  Surgeon: Darron Deatrice LABOR, MD;  Location: [facility];  Service: Cardiovascular;  Laterality: N/A;   LEG AMPUTATION     Right Below knee   STOMACH SURGERY     Billroth II gastrojejunostomy   TOTAL KNEE ARTHROPLASTY     Current Outpatient Medications on File Prior to Visit  Medication Sig Dispense Refill   ALPRAZolam  (XANAX ) 0.5 MG tablet Take 1 tablet (0.5 mg total) by mouth at bedtime as needed. for sleep 30 tablet 3   aspirin  81 MG tablet Take 81 mg by mouth daily.     budesonide  (ENTOCORT EC ) 3 MG 24 hr capsule Take 2 capsules (6 mg total) by mouth daily. 180 capsule 3   carvedilol  (COREG ) 3.125 MG tablet Take 1 tablet (3.125 mg total) by mouth 2 (two) times daily with a meal. 180 tablet 3   cholestyramine  (QUESTRAN ) 4 GM/DOSE powder Take 1 packet (4 g total) by mouth daily as needed (for colitis flares (mix and drink)). 368.76 g 1   dorzolamide  (TRUSOPT ) 2 % ophthalmic solution  Place 1 drop into both eyes 2 (two) times daily. 30 mL 3   losartan  (COZAAR ) 25 MG tablet Take 1 tablet (25 mg total) by mouth daily. 90 tablet 3   Multiple Vitamins-Minerals (PRESERVISION AREDS) CAPS Take 1 capsule by mouth 2 (two) times daily. 180 capsule 3   nitroGLYCERIN  (NITROSTAT ) 0.4 MG SL tablet Place 1 tablet (0.4 mg total) under the tongue every 5 (five) minutes as needed for chest pain. 25 tablet 3   Omega-3 Fatty Acids  ([employer] FISH OIL) 1200 MG CAPS Take 1,200 mg by mouth daily with breakfast.     rosuvastatin  (CRESTOR ) 20 MG tablet Take 1 tablet (20 mg total) by mouth daily. 90 tablet 3   sulfamethoxazole -trimethoprim  (BACTRIM  DS) 800-160 MG tablet Take 1 tablet by mouth 2 (two) times daily. 14  tablet 0   No current facility-administered medications on file prior to visit.   Allergies  Allergen Reactions   Aleve [Naproxen Sodium] Nausea And Vomiting and Other (See Comments)    No problem with other NSAIDs   Amoxicillin Other (See Comments)    Vertigo Did it involve swelling of the face/tongue/throat, SOB, or low BP? No Did it involve sudden or severe rash/hives, skin peeling, or any reaction on the inside of your mouth or nose? No Did you need to seek medical attention at a hospital or doctor's office? unknown When did it last happen?   yrs ago    dizziness If all above answers are NO, may proceed with cephalosporin use.    Gabapentin  Other (See Comments)    Might be sensitive to this- took 100 mg twice within a couple of hours = Ended up falling from a standing position and had to be taken to the E.D.   Morphine And Codeine Other (See Comments)    Dizziness    Penicillins Diarrhea and Other (See Comments)    Diarrhea Did it involve swelling of the face/tongue/throat, SOB, or low BP? No Did it involve sudden or severe rash/hives, skin peeling, or any reaction on the inside of your mouth or nose? No Did you need to seek medical attention at a hospital or doctor's office? Unknown When did it last happen?    years ago   If all above answers are NO, may proceed with cephalosporin use.    Social History   Socioeconomic History   Marital status: Married    Spouse name: Sport and exercise psychologist   Number of children: Not on file   Years of education: 12   Highest education level: High school graduate  Occupational History   Occupation: Retired  Tobacco Use   Smoking status: Former    Current packs/day: 1.00    Average packs/day: 1 pack/day for 8.0 years (8.0 ttl pk-yrs)    Types: Cigarettes   Smokeless tobacco: Never  Vaping Use   Vaping status: Never Used  Substance and Sexual Activity   Alcohol use: Yes    Alcohol/week: 3.0 standard drinks of alcohol    Types: 3 Cans of beer  per week    Comment: occasional 3-4 beers weekly   Drug use: No   Sexual activity: Yes    Comment: married  Other Topics Concern   Not on file  Social History Narrative   Not on file   Social Drivers of Health   Financial Resource Strain: Low Risk  (09/05/2021)   Overall Financial Resource Strain (CARDIA)    Difficulty of Paying Living Expenses: Not hard at all  Food Insecurity: No Food Insecurity (  09/05/2021)   Hunger Vital Sign    Worried About Running Out of Food in the Last Year: Never true    Ran Out of Food in the Last Year: Never true  Transportation Needs: No Transportation Needs (09/05/2021)   PRAPARE - Administrator, Civil Service (Medical): No    Lack of Transportation (Non-Medical): No  Physical Activity: Sufficiently Active (09/05/2021)   Exercise Vital Sign    Days of Exercise per Week: 5 days    Minutes of Exercise per Session: 30 min  Stress: No Stress Concern Present (09/05/2021)   [employer] of Occupational Health - Occupational Stress Questionnaire    Feeling of Stress : Not at all  Social Connections: Socially Integrated (09/05/2021)   Social Connection and Isolation Panel    Frequency of Communication with Friends and Family: More than three times a week    Frequency of Social Gatherings with Friends and Family: More than three times a week    Attends Religious Services: More than 4 times per year    Active Member of [employer] or Organizations: Yes    Attends Banker Meetings: More than 4 times per year    Marital Status: Married  Catering manager Violence: Not At Risk (09/05/2021)   Humiliation, Afraid, Rape, and Kick questionnaire    Fear of Current or Ex-Partner: No    Emotionally Abused: No    Physically Abused: No    Sexually Abused: No   Review of Systems  All other systems reviewed and are negative.      Objective:   Physical Exam Vitals reviewed.  Eyes:     Conjunctiva/sclera:     Right eye: No exudate or  hemorrhage.    Left eye: No exudate or hemorrhage. Cardiovascular:     Rate and Rhythm: Normal rate and regular rhythm.     Heart sounds: Normal heart sounds.  Pulmonary:     Effort: Pulmonary effort is normal. No respiratory distress.     Breath sounds: Normal breath sounds. No wheezing or rales.  Musculoskeletal:     Lumbar back: Spasms and tenderness present. No lacerations or bony tenderness. Normal range of motion.       Back:     Right hip: No tenderness or bony tenderness. Normal range of motion.     Left hip: No tenderness or bony tenderness. Normal range of motion.       Assessment & Plan:  Acute right-sided low back pain without sciatica I believe the back pain is most likely a muscle pull in his lower back.  Less likely would be sciatica.  Begin a prednisone  taper pack due to his inability to tolerate NSAIDs.  Reassess in 1 week.  Consider imaging if worsening.  I believe the nocturia is related to BPH.  Will try Flomax  0.4 mg p.o. nightly to help with this

## 2023-11-12 DIAGNOSIS — H353114 Nonexudative age-related macular degeneration, right eye, advanced atrophic with subfoveal involvement: Secondary | ICD-10-CM | POA: Diagnosis not present

## 2023-11-12 DIAGNOSIS — H353211 Exudative age-related macular degeneration, right eye, with active choroidal neovascularization: Secondary | ICD-10-CM | POA: Diagnosis not present

## 2023-11-12 DIAGNOSIS — H35371 Puckering of macula, right eye: Secondary | ICD-10-CM | POA: Diagnosis not present

## 2023-11-12 DIAGNOSIS — H353124 Nonexudative age-related macular degeneration, left eye, advanced atrophic with subfoveal involvement: Secondary | ICD-10-CM | POA: Diagnosis not present

## 2023-11-12 DIAGNOSIS — H401130 Primary open-angle glaucoma, bilateral, stage unspecified: Secondary | ICD-10-CM | POA: Diagnosis not present

## 2023-11-23 ENCOUNTER — Other Ambulatory Visit: Payer: Self-pay

## 2023-11-24 ENCOUNTER — Other Ambulatory Visit: Payer: Self-pay

## 2023-12-03 ENCOUNTER — Other Ambulatory Visit (HOSPITAL_COMMUNITY): Payer: Self-pay

## 2023-12-17 ENCOUNTER — Ambulatory Visit (INDEPENDENT_AMBULATORY_CARE_PROVIDER_SITE_OTHER): Admitting: *Deleted

## 2023-12-17 ENCOUNTER — Ambulatory Visit: Payer: Self-pay

## 2023-12-17 DIAGNOSIS — Z0000 Encounter for general adult medical examination without abnormal findings: Secondary | ICD-10-CM

## 2023-12-17 NOTE — Telephone Encounter (Signed)
 Copied from CRM #[id number]. Topic: Clinical - Red Word Triage >> Dec 17, 2023  9:16 AM Eugene [name] wrote: [facility] that prompted transfer to Nurse Triage:  Patient is having the following symptoms -  Lower Back pain No tingling or numbness    **Transf. to NT** Reason for Disposition  [1] SEVERE back pain (e.g., excruciating, unable to do any normal activities) AND [2] not improved 2 hours after pain medicine  Answer Assessment - Initial Assessment Questions Pt wanted PCP to call med in for back pain. Advised pt needed an appt. Pt had a preexisting appt next Tuesday for back pain. Rescheduled to Friday. Pt changed mind and wanted it back to Tuesday     1. ONSET: When did the pain begin? (e.g., minutes, hours, days)     2 weeks  2. LOCATION: Where does it hurt? (upper, mid or lower back)     lower 3. SEVERITY: How bad is the pain?  (e.g., Scale 1-10; mild, moderate, or severe)     10 4. PATTERN: Is the pain constant? (e.g., yes, no; constant, intermittent)      constant 5. RADIATION: Does the pain shoot into your legs or somewhere else?     no 7. BACK OVERUSE:  Any recent lifting of heavy objects, strenuous work or exercise?     no 8. MEDICINES: What have you taken so far for the pain? (e.g., nothing, acetaminophen , NSAIDS)     OTC medication  9. NEUROLOGIC SYMPTOMS: Do you have any weakness, numbness, or problems with bowel/bladder control?     no 10. OTHER SYMPTOMS: Do you have any other symptoms? (e.g., fever, abdomen pain, burning with urination, blood in urine)       no  Protocols used: Back Pain-A-AH

## 2023-12-17 NOTE — Patient Instructions (Signed)
 Eugene [name] , Thank you for taking time to come for your Medicare Wellness Visit. I appreciate your ongoing commitment to your health goals. Please review the following plan we discussed and let me know if I can assist you in the future.   Screening recommendations/referrals: Colonoscopy:  Recommended yearly ophthalmology/optometry visit for glaucoma screening and checkup Recommended yearly dental visit for hygiene and checkup  Vaccinations: Influenza vaccine:  Pneumococcal vaccine:  Tdap vaccine:  Shingles vaccine:        Preventive Care 65 Years and Older, Male Preventive care refers to lifestyle choices and visits with your health care provider that can promote health and wellness. What does preventive care include? A yearly physical exam. This is also called an annual well check. Dental exams once or twice a year. Routine eye exams. Ask your health care provider how often you should have your eyes checked. Personal lifestyle choices, including: Daily care of your teeth and gums. Regular physical activity. Eating a healthy diet. Avoiding tobacco and drug use. Limiting alcohol use. Practicing safe sex. Taking low doses of aspirin  every day. Taking vitamin and mineral supplements as recommended by your health care provider. What happens during an annual well check? The services and screenings done by your health care provider during your annual well check will depend on your age, overall health, lifestyle risk factors, and family history of disease. Counseling  Your health care provider may ask you questions about your: Alcohol use. Tobacco use. Drug use. Emotional well-being. Home and relationship well-being. Sexual activity. Eating habits. History of falls. Memory and ability to understand (cognition). Work and work Astronomer. Screening  You may have the following tests or measurements: Height, weight, and BMI. Blood pressure. Lipid and cholesterol levels. These  may be checked every 5 years, or more frequently if you are over 19 years old. Skin check. Lung cancer screening. You may have this screening every year starting at age 38 if you have a 30-pack-year history of smoking and currently smoke or have quit within the past 15 years. Fecal occult blood test (FOBT) of the stool. You may have this test every year starting at age 29. Flexible sigmoidoscopy or colonoscopy. You may have a sigmoidoscopy every 5 years or a colonoscopy every 10 years starting at age 48. Prostate cancer screening. Recommendations will vary depending on your family history and other risks. Hepatitis C blood test. Hepatitis B blood test. Sexually transmitted disease (STD) testing. Diabetes screening. This is done by checking your blood sugar (glucose) after you have not eaten for a while (fasting). You may have this done every 1-3 years. Abdominal aortic aneurysm (AAA) screening. You may need this if you are a current or former smoker. Osteoporosis. You may be screened starting at age 104 if you are at high risk. Talk with your health care provider about your test results, treatment options, and if necessary, the need for more tests. Vaccines  Your health care provider may recommend certain vaccines, such as: Influenza vaccine. This is recommended every year. Tetanus, diphtheria, and acellular pertussis (Tdap, Td) vaccine. You may need a Td booster every 10 years. Zoster vaccine. You may need this after age 17. Pneumococcal 13-valent conjugate (PCV13) vaccine. One dose is recommended after age 47. Pneumococcal polysaccharide (PPSV23) vaccine. One dose is recommended after age 86. Talk to your health care provider about which screenings and vaccines you need and how often you need them. This information is not intended to replace advice given to you by your health  care provider. Make sure you discuss any questions you have with your health care provider. Document Released:  03/30/2015 Document Revised: 11/21/2015 Document Reviewed: 01/02/2015 Elsevier Interactive Patient Education  2017 [employer].  Fall Prevention in the Home Falls can cause injuries. They can happen to people of all ages. There are many things you can do to make your home safe and to help prevent falls. What can I do on the outside of my home? Regularly fix the edges of walkways and driveways and fix any cracks. Remove anything that might make you trip as you walk through a door, such as a raised step or threshold. Trim any bushes or trees on the path to your home. Use bright outdoor lighting. Clear any walking paths of anything that might make someone trip, such as rocks or tools. Regularly check to see if handrails are loose or broken. Make sure that both sides of any steps have handrails. Any raised decks and porches should have guardrails on the edges. Have any leaves, snow, or ice cleared regularly. Use sand or salt on walking paths during winter. Clean up any spills in your garage right away. This includes oil or grease spills. What can I do in the bathroom? Use night lights. Install grab bars by the toilet and in the tub and shower. Do not use towel bars as grab bars. Use non-skid mats or decals in the tub or shower. If you need to sit down in the shower, use a plastic, non-slip stool. Keep the floor dry. Clean up any water that spills on the floor as soon as it happens. Remove soap buildup in the tub or shower regularly. Attach bath mats securely with double-sided non-slip rug tape. Do not have throw rugs and other things on the floor that can make you trip. What can I do in the bedroom? Use night lights. Make sure that you have a light by your bed that is easy to reach. Do not use any sheets or blankets that are too big for your bed. They should not hang down onto the floor. Have a firm chair that has side arms. You can use this for support while you get dressed. Do not have  throw rugs and other things on the floor that can make you trip. What can I do in the kitchen? Clean up any spills right away. Avoid walking on wet floors. Keep items that you use a lot in easy-to-reach places. If you need to reach something above you, use a strong step stool that has a grab bar. Keep electrical cords out of the way. Do not use floor polish or wax that makes floors slippery. If you must use wax, use non-skid floor wax. Do not have throw rugs and other things on the floor that can make you trip. What can I do with my stairs? Do not leave any items on the stairs. Make sure that there are handrails on both sides of the stairs and use them. Fix handrails that are broken or loose. Make sure that handrails are as long as the stairways. Check any carpeting to make sure that it is firmly attached to the stairs. Fix any carpet that is loose or worn. Avoid having throw rugs at the top or bottom of the stairs. If you do have throw rugs, attach them to the floor with carpet tape. Make sure that you have a light switch at the top of the stairs and the bottom of the stairs. If you do not  have them, ask someone to add them for you. What else can I do to help prevent falls? Wear shoes that: Do not have high heels. Have rubber bottoms. Are comfortable and fit you well. Are closed at the toe. Do not wear sandals. If you use a stepladder: Make sure that it is fully opened. Do not climb a closed stepladder. Make sure that both sides of the stepladder are locked into place. Ask someone to hold it for you, if possible. Clearly mark and make sure that you can see: Any grab bars or handrails. First and last steps. Where the edge of each step is. Use tools that help you move around (mobility aids) if they are needed. These include: Canes. Walkers. Scooters. Crutches. Turn on the lights when you go into a dark area. Replace any light bulbs as soon as they burn out. Set up your furniture so  you have a clear path. Avoid moving your furniture around. If any of your floors are uneven, fix them. If there are any pets around you, be aware of where they are. Review your medicines with your doctor. Some medicines can make you feel dizzy. This can increase your chance of falling. Ask your doctor what other things that you can do to help prevent falls. This information is not intended to replace advice given to you by your health care provider. Make sure you discuss any questions you have with your health care provider. Document Released: 12/28/2008 Document Revised: 08/09/2015 Document Reviewed: 04/07/2014 Elsevier Interactive Patient Education  2017 [employer].

## 2023-12-17 NOTE — Progress Notes (Signed)
 Subjective:   Eugene [name] is a 89 y.o. male who presents for Medicare Annual/Subsequent preventive examination.  Visit Complete: Virtual I connected with  Eugene [name] on 12/17/23 by a audio enabled telemedicine application and verified that I am speaking with the correct person using two identifiers.  Patient Location: Home  Provider Location: Home Office  I discussed the limitations of evaluation and management by telemedicine. The patient expressed understanding and agreed to proceed.  Vital Signs: Because this visit was a virtual/telehealth visit, some criteria may be missing or patient reported. Any vitals not documented were not able to be obtained and vitals that have been documented are patient reported.        Objective:    Today's Vitals   12/17/23 1052  PainSc: 7    There is no height or weight on file to calculate BMI.     05/08/2022    2:30 PM 09/05/2021   10:42 AM 04/06/2021    5:58 AM 04/06/2021    5:44 AM 04/05/2021    7:46 PM 10/08/2020   10:31 AM 08/16/2020    3:56 PM  Advanced Directives  Does Patient Have a Medical Advance Directive? No Yes  No Yes Yes Yes  Type of Furniture conservator/restorer;Living will   Living will  Healthcare Power of [place];Living will  Does patient want to make changes to medical advance directive?      No - Patient declined No - Patient declined  Copy of Healthcare Power of Attorney in Chart?  No - copy requested    No - copy requested No - copy requested  Would patient like information on creating a medical advance directive?   No - Patient declined        Current Medications (verified) Outpatient Encounter Medications as of 12/17/2023  Medication Sig   ALPRAZolam  (XANAX ) 0.5 MG tablet Take 1 tablet (0.5 mg total) by mouth at bedtime as needed. for sleep   aspirin  81 MG tablet Take 81 mg by mouth daily.   budesonide  (ENTOCORT EC ) 3 MG 24 hr capsule Take 2 capsules (6 mg total) by mouth daily.   carvedilol   (COREG ) 3.125 MG tablet Take 1 tablet (3.125 mg total) by mouth 2 (two) times daily with a meal.   cholestyramine  (QUESTRAN ) 4 GM/DOSE powder Take 1 packet (4 g total) by mouth daily as needed (for colitis flares (mix and drink)).   dorzolamide  (TRUSOPT ) 2 % ophthalmic solution Place 1 drop into both eyes 2 (two) times daily.   losartan  (COZAAR ) 25 MG tablet Take 1 tablet (25 mg total) by mouth daily.   Multiple Vitamins-Minerals (PRESERVISION AREDS) CAPS Take 1 capsule by mouth 2 (two) times daily.   nitroGLYCERIN  (NITROSTAT ) 0.4 MG SL tablet Place 1 tablet (0.4 mg total) under the tongue every 5 (five) minutes as needed for chest pain.   Omega-3 Fatty Acids  ([employer] FISH OIL) 1200 MG CAPS Take 1,200 mg by mouth daily with breakfast.   predniSONE  (DELTASONE ) 20 MG tablet Take 3 tablets by mouth on days 1 and 2, take 2 tablets by mouth on days 3 and 4, take 1 tablet by mouth on days 5 and 6   rosuvastatin  (CRESTOR ) 20 MG tablet Take 1 tablet (20 mg total) by mouth daily.   tamsulosin  (FLOMAX ) 0.4 MG CAPS capsule Take 1 capsule (0.4 mg total) by mouth daily.   No facility-administered encounter medications on file as of 12/17/2023.    Allergies (verified) Aleve [naproxen  sodium], Amoxicillin, Gabapentin , Morphine and codeine, and Penicillins   History: Past Medical History:  Diagnosis Date   Amputated right leg (HCC)    CAD (coronary artery disease)    a. 09/2017 - acute MI -  emergent cath showing 99% stenosis in midLAD with thrombotic stenosis at the bifurcation of the second diagonal, which was relatively small in diameter. He underwent placement of DES to midLAD which jailed the second diagonal.    Chronic combined systolic and diastolic heart failure (HCC)    Collagenous colitis    Dr. Milana   History of ST elevation myocardial infarction (STEMI)    09/2017: LAD   History of stomach ulcers    Hyperlipidemia LDL goal <70    Ischemic cardiomyopathy    Squamous cell carcinoma of skin  03/27/2014   in situ-left temple (txpbx)   Squamous cell carcinoma of skin 02/24/2018   in situ-left cheek ([id number])   Past Surgical History:  Procedure Laterality Date   BACK SURGERY     CARDIAC CATHETERIZATION     CORONARY STENT INTERVENTION N/A 10/07/2017   Procedure: CORONARY STENT INTERVENTION;  Surgeon: Darron Deatrice LABOR, MD;  Location: [facility];  Service: Cardiovascular;  Laterality: N/A;   CORONARY/GRAFT ACUTE MI REVASCULARIZATION N/A 10/07/2017   Procedure: Coronary/Graft Acute MI Revascularization;  Surgeon: Darron Deatrice LABOR, MD;  Location: [facility];  Service: Cardiovascular;  Laterality: N/A;   LEFT HEART CATH AND CORONARY ANGIOGRAPHY N/A 10/07/2017   Procedure: LEFT HEART CATH AND CORONARY ANGIOGRAPHY;  Surgeon: Darron Deatrice LABOR, MD;  Location: [facility];  Service: Cardiovascular;  Laterality: N/A;   LEG AMPUTATION     Right Below knee   STOMACH SURGERY     Billroth II gastrojejunostomy   TOTAL KNEE ARTHROPLASTY     Family History  Problem Relation Age of Onset   Diabetes Sister    Diabetes Brother    Diabetes Sister    Cancer Sister        breast   Social History   Socioeconomic History   Marital status: Married    Spouse name: Sport and exercise psychologist   Number of children: Not on file   Years of education: 12   Highest education level: High school graduate  Occupational History   Occupation: Retired  Tobacco Use   Smoking status: Former    Current packs/day: 1.00    Average packs/day: 1 pack/day for 8.0 years (8.0 ttl pk-yrs)    Types: Cigarettes   Smokeless tobacco: Never  Vaping Use   Vaping status: Never Used  Substance and Sexual Activity   Alcohol use: Yes    Alcohol/week: 3.0 standard drinks of alcohol    Types: 3 Cans of beer per week    Comment: occasional 3-4 beers weekly   Drug use: No   Sexual activity: Yes    Comment: married  Other Topics Concern   Not on file  Social History Narrative   Not on file   Social Drivers of  Health   Financial Resource Strain: Low Risk  (09/05/2021)   Overall Financial Resource Strain (CARDIA)    Difficulty of Paying Living Expenses: Not hard at all  Food Insecurity: No Food Insecurity (09/05/2021)   Hunger Vital Sign    Worried About Running Out of Food in the Last Year: Never true    Ran Out of Food in the Last Year: Never true  Transportation Needs: No Transportation Needs (09/05/2021)   PRAPARE - Transportation  Lack of Transportation (Medical): No    Lack of Transportation (Non-Medical): No  Physical Activity: Sufficiently Active (09/05/2021)   Exercise Vital Sign    Days of Exercise per Week: 5 days    Minutes of Exercise per Session: 30 min  Stress: No Stress Concern Present (09/05/2021)   [employer] of Occupational Health - Occupational Stress Questionnaire    Feeling of Stress : Not at all  Social Connections: Socially Integrated (09/05/2021)   Social Connection and Isolation Panel    Frequency of Communication with Friends and Family: More than three times a week    Frequency of Social Gatherings with Friends and Family: More than three times a week    Attends Religious Services: More than 4 times per year    Active Member of [employer] or Organizations: Yes    Attends Engineer, structural: More than 4 times per year    Marital Status: Married    Tobacco Counseling Counseling given: Not Answered   Clinical Intake:  Pre-visit preparation completed: Yes  Pain : 0-10 Pain Score: 7  Pain Location: Back Pain Onset: More than a month ago Pain Frequency: Constant     Diabetes: No  How often do you need to have someone help you when you read instructions, pamphlets, or other written materials from your doctor or pharmacy?: 1 - Never  Interpreter Needed?: No  Information entered by :: Mliss Graff LPN   Activities of Daily Living    12/17/2023   10:58 AM  In your present state of health, do you have any difficulty performing the following  activities:  Hearing? 1  Vision? 0  Difficulty concentrating or making decisions? 1  Walking or climbing stairs? 0  Dressing or bathing? 1  Doing errands, shopping? 1  Preparing Food and eating ? N  Using the Toilet? N  In the past six months, have you accidently leaked urine? N  Do you have problems with loss of bowel control? N  Managing your Medications? Y  Managing your Finances? Y  Housekeeping or managing your Housekeeping? Y    Patient Care Team: Duanne Butler DASEN, MD as PCP - General (Family Medicine) Verlin Lonni BIRCH, MD as PCP - Cardiology (Cardiology) Nicholaus Sherlean CROME, [facility] (Inactive) as Pharmacist (Pharmacist) Porter Andrez SAUNDERS, PA-C (Inactive) as Physician Assistant (Dermatology)  Indicate any recent Medical Services you may have received from other than [facility] providers in the past year (date may be approximate).     Assessment:   This is a routine wellness examination for [place].  Hearing/Vision screen Hearing Screening - Comments:: Bilateral hearing aids Vision Screening - Comments:: Up to date [facility]   Goals Addressed   None    Depression Screen    06/23/2023   11:10 AM 02/02/2023   10:08 AM 07/21/2022    4:20 PM 07/17/2022    9:15 AM 01/06/2022    4:06 PM 09/05/2021   10:40 AM 10/08/2020   10:29 AM  PHQ 2/9 Scores  PHQ - 2 Score 5  0 0 0 0 0  PHQ- 9 Score 11    0    Exception Documentation  Patient refusal         Fall Risk    12/17/2023   10:54 AM 08/12/2023   11:53 AM 06/23/2023   11:10 AM 02/02/2023   10:08 AM 07/21/2022    4:20 PM  Fall Risk   Falls in the past year? 1 1 0 0 1  Number falls in past  yr: 1 0 0 0 1  Injury with Fall? 1 1 0 0 1  Comment skin tears      Risk for fall due to :  Other (Comment) No Fall Risks No Fall Risks History of fall(s);Impaired balance/gait;Orthopedic patient  Risk for fall due to: Comment  age     Follow up Falls evaluation completed;Education provided;Falls prevention discussed  Falls prevention  discussed;Falls evaluation completed Falls prevention discussed Education provided;Falls prevention discussed    MEDICARE RISK AT HOME: Medicare Risk at Home Any stairs in or around the home?: Yes If so, are there any without handrails?: No Home free of loose throw rugs in walkways, pet beds, electrical cords, etc?: Yes Adequate lighting in your home to reduce risk of falls?: Yes Life alert?: Yes Use of a cane, walker or w/c?: Yes Grab bars in the bathroom?: Yes Shower chair or bench in shower?: Yes Elevated toilet seat or a handicapped toilet?: Yes  TIMED UP AND GO:  Was the test performed?  No    Cognitive Function:        12/17/2023   10:55 AM 09/05/2021   10:49 AM  6CIT Screen  What Year? 4 points 0 points  What month? 0 points 0 points  What time? 0 points 0 points  Count back from 20 0 points 0 points  Months in reverse 4 points 0 points  Repeat phrase 4 points 0 points  Total Score 12 points 0 points    Immunizations Immunization History  Administered Date(s) Administered   Fluad Quad(high Dose 65+) 11/08/2018, 01/15/2021   Fluad Trivalent(High Dose 65+) 02/02/2023   INFLUENZA, HIGH DOSE SEASONAL PF 01/08/2017, 12/15/2017   Influenza,inj,Quad PF,6+ Mos 12/28/2012, 01/17/2014   Influenza-Unspecified 01/15/2016, 12/15/2017   PFIZER(Purple Top)SARS-COV-2 Vaccination 04/08/2019, 04/28/2019, 12/27/2019, 01/23/2020   Pneumococcal Conjugate-13 09/21/2014   Pneumococcal Polysaccharide-23 05/09/2002   Tdap 07/29/2012, 05/08/2022   Zoster Recombinant(Shingrix) 05/27/2017, 07/30/2017   Zoster, Live 07/07/2006    TDAP status: Up to date  Flu Vaccine status: Due, Education has been provided regarding the importance of this vaccine. Advised may receive this vaccine at local pharmacy or Health Dept. Aware to provide a copy of the vaccination record if obtained from local pharmacy or Health Dept. Verbalized acceptance and understanding.  Pneumococcal vaccine status: Up to  date  Covid-19 vaccine status: Declined, Education has been provided regarding the importance of this vaccine but patient still declined. Advised may receive this vaccine at local pharmacy or Health Dept.or vaccine clinic. Aware to provide a copy of the vaccination record if obtained from local pharmacy or Health Dept. Verbalized acceptance and understanding.  Qualifies for Shingles Vaccine? No   Zostavax completed Yes   Shingrix Completed?: Yes  Screening Tests Health Maintenance  Topic Date Due   COVID-19 Vaccine (5 - 2025-26 season) 11/16/2023   Influenza Vaccine  06/14/2024 (Originally 10/16/2023)   Medicare Annual Wellness (AWV)  12/16/2024   DTaP/Tdap/Td (3 - Td or Tdap) 05/08/2032   Pneumococcal Vaccine: 50+ Years  Completed   Zoster Vaccines- Shingrix  Completed   HPV VACCINES  Aged Out   Meningococcal B Vaccine  Aged Out    Health Maintenance  Health Maintenance Due  Topic Date Due   COVID-19 Vaccine (5 - 2025-26 season) 11/16/2023    Colorectal cancer screening: No longer required.   Lung Cancer Screening: (Low Dose CT Chest recommended if Age 84-80 years, 20 pack-year currently smoking OR have quit w/in 15years.) does not qualify.   Lung Cancer Screening  Referral:   Additional Screening:  Hepatitis C Screening: does not qualify;  Vision Screening: Recommended annual ophthalmology exams for early detection of glaucoma and other disorders of the eye. Is the patient up to date with their annual eye exam?  Yes  Who is the provider or what is the name of the office in which the patient attends annual eye exams? [facility] If pt is not established with a provider, would they like to be referred to a provider to establish care? No .   Dental Screening: Recommended annual dental exams for proper oral hygiene    Community Resource Referral / Chronic Care Management: CRR required this visit?  No   CCM required this visit?  No     Plan:     I have personally  reviewed and noted the following in the patient's chart:   Medical and social history Use of alcohol, tobacco or illicit drugs  Current medications and supplements including opioid prescriptions. Patient is not currently taking opioid prescriptions. Functional ability and status Nutritional status Physical activity Advanced directives List of other physicians Hospitalizations, surgeries, and ER visits in previous 12 months Vitals Screenings to include cognitive, depression, and falls Referrals and appointments  In addition, I have reviewed and discussed with patient certain preventive protocols, quality metrics, and best practice recommendations. A written personalized care plan for preventive services as well as general preventive health recommendations were provided to patient.     Mliss Graff, LPN   [id number]   After Visit Summary: (MyChart) Due to this being a telephonic visit, the after visit summary with patients personalized plan was offered to patient via MyChart   Nurse Notes:

## 2023-12-18 ENCOUNTER — Ambulatory Visit: Admitting: Family Medicine

## 2023-12-22 ENCOUNTER — Ambulatory Visit: Admitting: Family Medicine

## 2023-12-22 ENCOUNTER — Encounter: Payer: Self-pay | Admitting: Family Medicine

## 2023-12-22 ENCOUNTER — Ambulatory Visit

## 2023-12-22 ENCOUNTER — Other Ambulatory Visit: Payer: Self-pay

## 2023-12-22 VITALS — BP 120/80 | HR 74 | Temp 97.6°F | Ht 69.0 in | Wt 135.0 lb

## 2023-12-22 DIAGNOSIS — K529 Noninfective gastroenteritis and colitis, unspecified: Secondary | ICD-10-CM | POA: Insufficient documentation

## 2023-12-22 DIAGNOSIS — Z23 Encounter for immunization: Secondary | ICD-10-CM | POA: Diagnosis not present

## 2023-12-22 DIAGNOSIS — M545 Low back pain, unspecified: Secondary | ICD-10-CM | POA: Diagnosis not present

## 2023-12-22 NOTE — Progress Notes (Signed)
 Subjective:    Patient ID: Eugene [name], male    DOB: September 08, 1931, 89 y.o.   MRN: [id number] 11/10/23 Patient reports a 1 week history of low back pain in his right lower back just above his right gluteus muscle.  There is no tenderness to palpation over the lumbar spinous processes.  There is no tenderness to palpation over his lower right or left ribs.  There is no CVA tenderness.  There is no visible rash or swelling.  The pain is located primarily at just above his right gluteus.  He denies any radiation down his right leg into his right thigh.  He denies any numbness or tingling in his right leg.  He denies any dysuria urgency or frequency or fever or chills or hematuria.  He does have chronic nocturia.  He wakes up 4-5 times every night to urinate however this is gone on for a long time.  This does keep him from sleeping.  At that time, my pan was: I believe the back pain is most likely a muscle pull in his lower back.  Less likely would be sciatica.  Begin a prednisone  taper pack due to his inability to tolerate NSAIDs.  Reassess in 1 week.  Consider imaging if worsening.  I believe the nocturia is related to BPH.  Will try Flomax  0.4 mg p.o. nightly to help with this  12/22/23 Patient complains of pain in his right lower back.  The pain is located above his right gluteus.  It comes and goes.  There is no specific trigger.  He denies any fevers or chills.  He denies any abdominal pain.  He denies any lymphadenopathy.  He denies any sciatica.  He denies any bowel or bladder incontinence.  There is no tenderness to palpation over the lumbar spinous processes.  There is no pain with palpation over the iliac crest on the right side.  The patient does have worsening pain with standing.  Past Medical History:  Diagnosis Date   Amputated right leg (HCC)    CAD (coronary artery disease)    a. 09/2017 - acute MI -  emergent cath showing 99% stenosis in midLAD with thrombotic stenosis at the bifurcation of  the second diagonal, which was relatively small in diameter. He underwent placement of DES to midLAD which jailed the second diagonal.    Chronic combined systolic and diastolic heart failure (HCC)    Collagenous colitis    Dr. Milana   History of ST elevation myocardial infarction (STEMI)    09/2017: LAD   History of stomach ulcers    Hyperlipidemia LDL goal <70    Ischemic cardiomyopathy    Squamous cell carcinoma of skin 03/27/2014   in situ-left temple (txpbx)   Squamous cell carcinoma of skin 02/24/2018   in situ-left cheek ([id number])   Past Surgical History:  Procedure Laterality Date   BACK SURGERY     CARDIAC CATHETERIZATION     CORONARY STENT INTERVENTION N/A 10/07/2017   Procedure: CORONARY STENT INTERVENTION;  Surgeon: Darron Deatrice LABOR, MD;  Location: [facility];  Service: Cardiovascular;  Laterality: N/A;   CORONARY/GRAFT ACUTE MI REVASCULARIZATION N/A 10/07/2017   Procedure: Coronary/Graft Acute MI Revascularization;  Surgeon: Darron Deatrice LABOR, MD;  Location: [facility];  Service: Cardiovascular;  Laterality: N/A;   LEFT HEART CATH AND CORONARY ANGIOGRAPHY N/A 10/07/2017   Procedure: LEFT HEART CATH AND CORONARY ANGIOGRAPHY;  Surgeon: Darron Deatrice LABOR, MD;  Location: [facility];  Service: Cardiovascular;  Laterality: N/A;   LEG AMPUTATION     Right Below knee   STOMACH SURGERY     Billroth II gastrojejunostomy   TOTAL KNEE ARTHROPLASTY     Current Outpatient Medications on File Prior to Visit  Medication Sig Dispense Refill   ALPRAZolam  (XANAX ) 0.5 MG tablet Take 1 tablet (0.5 mg total) by mouth at bedtime as needed. for sleep 30 tablet 3   aspirin  81 MG tablet Take 81 mg by mouth daily.     budesonide  (ENTOCORT EC ) 3 MG 24 hr capsule Take 2 capsules (6 mg total) by mouth daily. 180 capsule 3   carvedilol  (COREG ) 3.125 MG tablet Take 1 tablet (3.125 mg total) by mouth 2 (two) times daily with a meal. 180 tablet 3   cholestyramine  (QUESTRAN ) 4  GM/DOSE powder Take 1 packet (4 g total) by mouth daily as needed (for colitis flares (mix and drink)). 368.76 g 1   dorzolamide  (TRUSOPT ) 2 % ophthalmic solution Place 1 drop into both eyes 2 (two) times daily. 30 mL 3   losartan  (COZAAR ) 25 MG tablet Take 1 tablet (25 mg total) by mouth daily. 90 tablet 3   Multiple Vitamins-Minerals (PRESERVISION AREDS) CAPS Take 1 capsule by mouth 2 (two) times daily. 180 capsule 3   nitroGLYCERIN  (NITROSTAT ) 0.4 MG SL tablet Place 1 tablet (0.4 mg total) under the tongue every 5 (five) minutes as needed for chest pain. 25 tablet 3   Omega-3 Fatty Acids  ([employer] FISH OIL) 1200 MG CAPS Take 1,200 mg by mouth daily with breakfast.     rosuvastatin  (CRESTOR ) 20 MG tablet Take 1 tablet (20 mg total) by mouth daily. 90 tablet 3   tamsulosin  (FLOMAX ) 0.4 MG CAPS capsule Take 1 capsule (0.4 mg total) by mouth daily. 30 capsule 3   predniSONE  (DELTASONE ) 20 MG tablet Take 3 tablets by mouth on days 1 and 2, take 2 tablets by mouth on days 3 and 4, take 1 tablet by mouth on days 5 and 6 (Patient not taking: Reported on 12/22/2023) 12 tablet 0   No current facility-administered medications on file prior to visit.   Allergies  Allergen Reactions   Aleve [Naproxen Sodium] Nausea And Vomiting and Other (See Comments)    No problem with other NSAIDs   Amoxicillin Other (See Comments)    Vertigo Did it involve swelling of the face/tongue/throat, SOB, or low BP? No Did it involve sudden or severe rash/hives, skin peeling, or any reaction on the inside of your mouth or nose? No Did you need to seek medical attention at a hospital or doctor's office? unknown When did it last happen?   yrs ago    dizziness If all above answers are NO, may proceed with cephalosporin use.    Gabapentin  Other (See Comments)    Might be sensitive to this- took 100 mg twice within a couple of hours = Ended up falling from a standing position and had to be taken to the E.D.   Morphine And Codeine  Other (See Comments)    Dizziness    Penicillins Diarrhea and Other (See Comments)    Diarrhea Did it involve swelling of the face/tongue/throat, SOB, or low BP? No Did it involve sudden or severe rash/hives, skin peeling, or any reaction on the inside of your mouth or nose? No Did you need to seek medical attention at a hospital or doctor's office? Unknown When did it last happen?    years ago   If  all above answers are NO, may proceed with cephalosporin use.    Social History   Socioeconomic History   Marital status: Married    Spouse name: Sport and exercise psychologist   Number of children: Not on file   Years of education: 12   Highest education level: High school graduate  Occupational History   Occupation: Retired  Tobacco Use   Smoking status: Former    Current packs/day: 1.00    Average packs/day: 1 pack/day for 8.0 years (8.0 ttl pk-yrs)    Types: Cigarettes   Smokeless tobacco: Never  Vaping Use   Vaping status: Never Used  Substance and Sexual Activity   Alcohol use: Yes    Alcohol/week: 3.0 standard drinks of alcohol    Types: 3 Cans of beer per week    Comment: occasional 3-4 beers weekly   Drug use: No   Sexual activity: Yes    Comment: married  Other Topics Concern   Not on file  Social History Narrative   Not on file   Social Drivers of Health   Financial Resource Strain: Low Risk  (12/17/2023)   Overall Financial Resource Strain (CARDIA)    Difficulty of Paying Living Expenses: Not hard at all  Food Insecurity: Unknown (12/17/2023)   Hunger Vital Sign    Worried About Running Out of Food in the Last Year: Not on file    Ran Out of Food in the Last Year: Never true  Transportation Needs: No Transportation Needs (12/17/2023)   PRAPARE - Administrator, Civil Service (Medical): No    Lack of Transportation (Non-Medical): No  Physical Activity: Insufficiently Active (12/17/2023)   Exercise Vital Sign    Days of Exercise per Week: 2 days    Minutes of Exercise  per Session: 30 min  Stress: No Stress Concern Present (12/17/2023)   [employer] of Occupational Health - Occupational Stress Questionnaire    Feeling of Stress: Not at all  Social Connections: Moderately Isolated (12/17/2023)   Social Connection and Isolation Panel    Frequency of Communication with Friends and Family: Once a week    Frequency of Social Gatherings with Friends and Family: More than three times a week    Attends Religious Services: More than 4 times per year    Active Member of [employer] or Organizations: No    Attends Banker Meetings: Never    Marital Status: Widowed  Intimate Partner Violence: Not At Risk (12/17/2023)   Humiliation, Afraid, Rape, and Kick questionnaire    Fear of Current or Ex-Partner: No    Emotionally Abused: No    Physically Abused: No    Sexually Abused: No   Review of Systems  All other systems reviewed and are negative.      Objective:   Physical Exam Vitals reviewed.  Eyes:     Conjunctiva/sclera:     Right eye: No exudate or hemorrhage.    Left eye: No exudate or hemorrhage. Cardiovascular:     Rate and Rhythm: Normal rate and regular rhythm.     Heart sounds: Normal heart sounds.  Pulmonary:     Effort: Pulmonary effort is normal. No respiratory distress.     Breath sounds: Normal breath sounds. No wheezing or rales.  Musculoskeletal:     Lumbar back: Spasms and tenderness present. No lacerations or bony tenderness. Normal range of motion.       Back:     Right hip: No tenderness or bony tenderness. Normal range of  motion.     Left hip: No tenderness or bony tenderness. Normal range of motion.       Assessment & Plan:  Acute midline low back pain, unspecified whether sciatica present - Plan: DG Lumbar Spine Complete, CBC with Differential/Platelet, Comprehensive metabolic panel with GFR, Sedimentation rate, DG Pelvis Comp Min 3V, Flu vaccine HIGH DOSE PF(Fluzone Trivalent)  Need for vaccination - Plan: Flu  vaccine HIGH DOSE PF(Fluzone Trivalent) Begin Tylenol  1000 mg twice daily.  He is unable to take NSAIDs because of heart disease.  Obtain x-rays of the lumbar spine as well as the pelvis to rule out any bony lesions that may be related to cancers.  If the x-ray showed arthritic changes, I would recommend treating this with Tylenol , physical therapy, and the sparing use of muscle relaxer such as tizanidine .

## 2023-12-23 LAB — CBC WITH DIFFERENTIAL/PLATELET
Absolute Lymphocytes: 2386 {cells}/uL (ref 850–3900)
Absolute Monocytes: 951 {cells}/uL — ABNORMAL HIGH (ref 200–950)
Basophils Absolute: 41 {cells}/uL (ref 0–200)
Basophils Relative: 0.5 %
Eosinophils Absolute: 213 {cells}/uL (ref 15–500)
Eosinophils Relative: 2.6 %
HCT: 38.4 % — ABNORMAL LOW (ref 38.5–50.0)
Hemoglobin: 12.3 g/dL — ABNORMAL LOW (ref 13.2–17.1)
MCH: 30.8 pg (ref 27.0–33.0)
MCHC: 32 g/dL (ref 32.0–36.0)
MCV: 96 fL (ref 80.0–100.0)
MPV: 9.3 fL (ref 7.5–12.5)
Monocytes Relative: 11.6 %
Neutro Abs: 4608 {cells}/uL (ref 1500–7800)
Neutrophils Relative %: 56.2 %
Platelets: 247 Thousand/uL (ref 140–400)
RBC: 4 Million/uL — ABNORMAL LOW (ref 4.20–5.80)
RDW: 12.4 % (ref 11.0–15.0)
Total Lymphocyte: 29.1 %
WBC: 8.2 Thousand/uL (ref 3.8–10.8)

## 2023-12-23 LAB — SEDIMENTATION RATE: Sed Rate: 6 mm/h (ref 0–20)

## 2023-12-23 LAB — COMPREHENSIVE METABOLIC PANEL WITH GFR
AG Ratio: 1.8 (calc) (ref 1.0–2.5)
ALT: 14 U/L (ref 9–46)
AST: 18 U/L (ref 10–35)
Albumin: 3.7 g/dL (ref 3.6–5.1)
Alkaline phosphatase (APISO): 84 U/L (ref 35–144)
BUN: 10 mg/dL (ref 7–25)
CO2: 27 mmol/L (ref 20–32)
Calcium: 9.2 mg/dL (ref 8.6–10.3)
Chloride: 101 mmol/L (ref 98–110)
Creat: 0.8 mg/dL (ref 0.70–1.22)
Globulin: 2.1 g/dL (ref 1.9–3.7)
Glucose, Bld: 91 mg/dL (ref 65–99)
Potassium: 4.2 mmol/L (ref 3.5–5.3)
Sodium: 133 mmol/L — ABNORMAL LOW (ref 135–146)
Total Bilirubin: 0.6 mg/dL (ref 0.2–1.2)
Total Protein: 5.8 g/dL — ABNORMAL LOW (ref 6.1–8.1)
eGFR: 84 mL/min/1.73m2 (ref >=60)

## 2023-12-24 ENCOUNTER — Ambulatory Visit: Payer: Self-pay | Admitting: Family Medicine

## 2023-12-31 ENCOUNTER — Ambulatory Visit
Admission: RE | Admit: 2023-12-31 | Discharge: 2023-12-31 | Disposition: A | Discharge status: Home / Self Care | Source: Ambulatory Visit | Attending: Family Medicine | Admitting: Family Medicine

## 2023-12-31 DIAGNOSIS — M47816 Spondylosis without myelopathy or radiculopathy, lumbar region: Secondary | ICD-10-CM | POA: Diagnosis not present

## 2023-12-31 DIAGNOSIS — M545 Low back pain, unspecified: Secondary | ICD-10-CM

## 2023-12-31 DIAGNOSIS — M160 Bilateral primary osteoarthritis of hip: Secondary | ICD-10-CM | POA: Diagnosis not present

## 2024-01-05 ENCOUNTER — Other Ambulatory Visit: Payer: Self-pay

## 2024-01-05 DIAGNOSIS — M545 Low back pain, unspecified: Secondary | ICD-10-CM

## 2024-01-07 DIAGNOSIS — H353124 Nonexudative age-related macular degeneration, left eye, advanced atrophic with subfoveal involvement: Secondary | ICD-10-CM | POA: Diagnosis not present

## 2024-01-07 DIAGNOSIS — H353211 Exudative age-related macular degeneration, right eye, with active choroidal neovascularization: Secondary | ICD-10-CM | POA: Diagnosis not present

## 2024-01-07 DIAGNOSIS — H35371 Puckering of macula, right eye: Secondary | ICD-10-CM | POA: Diagnosis not present

## 2024-01-07 DIAGNOSIS — H401130 Primary open-angle glaucoma, bilateral, stage unspecified: Secondary | ICD-10-CM | POA: Diagnosis not present

## 2024-01-07 DIAGNOSIS — H353114 Nonexudative age-related macular degeneration, right eye, advanced atrophic with subfoveal involvement: Secondary | ICD-10-CM | POA: Diagnosis not present

## 2024-01-12 ENCOUNTER — Other Ambulatory Visit: Payer: Self-pay

## 2024-01-15 ENCOUNTER — Other Ambulatory Visit (HOSPITAL_COMMUNITY): Payer: Self-pay

## 2024-01-15 ENCOUNTER — Other Ambulatory Visit: Payer: Self-pay

## 2024-01-28 DIAGNOSIS — M67951 Unspecified disorder of synovium and tendon, right thigh: Secondary | ICD-10-CM | POA: Diagnosis not present

## 2024-02-01 DIAGNOSIS — M67951 Unspecified disorder of synovium and tendon, right thigh: Secondary | ICD-10-CM | POA: Diagnosis not present

## 2024-02-04 ENCOUNTER — Other Ambulatory Visit (HOSPITAL_COMMUNITY): Payer: Self-pay

## 2024-02-16 DIAGNOSIS — M67951 Unspecified disorder of synovium and tendon, right thigh: Secondary | ICD-10-CM | POA: Diagnosis not present

## 2024-02-23 DIAGNOSIS — M67951 Unspecified disorder of synovium and tendon, right thigh: Secondary | ICD-10-CM | POA: Diagnosis not present

## 2024-03-02 ENCOUNTER — Other Ambulatory Visit: Payer: Self-pay

## 2024-03-02 ENCOUNTER — Ambulatory Visit: Admitting: Podiatry

## 2024-03-02 ENCOUNTER — Other Ambulatory Visit: Payer: Self-pay | Admitting: Family Medicine

## 2024-03-02 ENCOUNTER — Other Ambulatory Visit (HOSPITAL_COMMUNITY): Payer: Self-pay

## 2024-03-02 DIAGNOSIS — M7752 Other enthesopathy of left foot: Secondary | ICD-10-CM | POA: Diagnosis not present

## 2024-03-02 DIAGNOSIS — B351 Tinea unguium: Secondary | ICD-10-CM | POA: Diagnosis not present

## 2024-03-02 DIAGNOSIS — M79675 Pain in left toe(s): Secondary | ICD-10-CM | POA: Diagnosis not present

## 2024-03-02 DIAGNOSIS — M79674 Pain in right toe(s): Secondary | ICD-10-CM

## 2024-03-02 MED ORDER — TAMSULOSIN HCL 0.4 MG PO CAPS
0.4000 mg | ORAL_CAPSULE | Freq: Every day | ORAL | 3 refills | Status: DC
  Filled 2024-03-02: qty 30, 30d supply, fill #0

## 2024-03-02 NOTE — Progress Notes (Signed)
°  Subjective:  Patient ID: Eugene [name], male    DOB: July 04, 1931,  MRN: [id number]  Chief Complaint  Patient presents with   Nail Problem    Nail trim    89 y.o. male returns for the above complaint.  Patient presents with thickened elongated Shogry mycotic toenails x 10 mild pain on problems with ambulation and shoe pressure would like to have a debride out he is unable to do so.  He has secondary complaint of left first metatarsophalangeal joint pain he states that is bothering him he has had some arthritis in that he would like to do a steroid shot  Objective:  There were no vitals filed for this visit. Podiatric Exam: Vascular: dorsalis pedis and posterior tibial pulses are palpable bilateral. Capillary return is immediate. Temperature gradient is WNL. Skin turgor WNL  Sensorium: Normal Semmes Weinstein monofilament test. Normal tactile sensation bilaterally. Nail Exam: Pt has thick disfigured discolored nails with subungual debris noted bilateral entire nail hallux through fifth toenails.  Pain on palpation to the nails. Ulcer Exam: There is no evidence of ulcer or pre-ulcerative changes or infection. Orthopedic Exam: Muscle tone and strength are WNL. No limitations in general ROM. No crepitus or effusions noted.  Pain on palpation left first metatarsophalangeal joint some crepitus noted limited range of motion noted. Skin: No Porokeratosis. No infection or ulcers    Assessment & Plan:   1. Pain due to onychomycosis of toenails of both feet   2. Capsulitis of metatarsophalangeal (MTP) joint of left foot     Patient was evaluated and treated and all questions answered.  Left first MTP capsulitis All questions and concerns were discussed with the patient in extensive detail given the amount of pain that is having a benefit from steroid injection because confirmed ecchymosis or pain.  Patient agrees to proper proceed with steroid injection --A steroid injection was performed at Left  first MTP using 1% plain Lidocaine  and 10 mg of Kenalog . This was well tolerated.   Onychomycosis with pain  -Nails palliatively debrided as below. -Educated on self-care  Procedure: Nail Debridement Rationale: pain  Type of Debridement: manual, sharp debridement. Instrumentation: Nail nipper, rotary burr. Number of Nails: 10  Procedures and Treatment: Consent by patient was obtained for treatment procedures. The patient understood the discussion of treatment and procedures well. All questions were answered thoroughly reviewed. Debridement of mycotic and hypertrophic toenails, 1 through 5 bilateral and clearing of subungual debris. No ulceration, no infection noted.  Return Visit-Office Procedure: Patient instructed to return to the office for a follow up visit 3 months for continued evaluation and treatment.  Franky Blanch, DPM    Return in about 3 months (around 05/31/2024) for RFC .

## 2024-03-16 ENCOUNTER — Telehealth: Payer: Self-pay

## 2024-03-16 NOTE — Telephone Encounter (Signed)
 Copied from CRM #[id number]. Topic: Clinical - Medical Advice >> Mar 16, 2024 12:30 PM Zy'onna H wrote: Reason for CRM: Patient's daughter called in to inquire if the patient has to fast for his upcoming physical.   She sated in the appt notes it stated he did not need fasting, but he was instructed to do so.  Can someone verify with him ASAP.

## 2024-03-22 ENCOUNTER — Ambulatory Visit: Admitting: Family Medicine

## 2024-03-22 ENCOUNTER — Encounter: Payer: Self-pay | Admitting: Family Medicine

## 2024-03-22 VITALS — BP 132/64 | HR 64 | Ht 69.0 in | Wt 128.4 lb

## 2024-03-22 DIAGNOSIS — I2510 Atherosclerotic heart disease of native coronary artery without angina pectoris: Secondary | ICD-10-CM

## 2024-03-22 DIAGNOSIS — Z0001 Encounter for general adult medical examination with abnormal findings: Secondary | ICD-10-CM | POA: Diagnosis not present

## 2024-03-22 DIAGNOSIS — I252 Old myocardial infarction: Secondary | ICD-10-CM | POA: Diagnosis not present

## 2024-03-22 DIAGNOSIS — Z89511 Acquired absence of right leg below knee: Secondary | ICD-10-CM | POA: Diagnosis not present

## 2024-03-22 DIAGNOSIS — R5383 Other fatigue: Secondary | ICD-10-CM | POA: Diagnosis not present

## 2024-03-22 DIAGNOSIS — E785 Hyperlipidemia, unspecified: Secondary | ICD-10-CM

## 2024-03-22 LAB — CBC WITH DIFFERENTIAL/PLATELET
Absolute Lymphocytes: 2511 {cells}/uL (ref 850–3900)
Absolute Monocytes: 810 {cells}/uL (ref 200–950)
Basophils Absolute: 36 {cells}/uL (ref 0–200)
Basophils Relative: 0.4 %
Eosinophils Absolute: 234 {cells}/uL (ref 15–500)
Eosinophils Relative: 2.6 %
HCT: 39.5 % (ref 39.4–51.1)
Hemoglobin: 12.6 g/dL — ABNORMAL LOW (ref 13.2–17.1)
MCH: 30.4 pg (ref 27.0–33.0)
MCHC: 31.9 g/dL (ref 31.6–35.4)
MCV: 95.2 fL (ref 81.4–101.7)
MPV: 9.6 fL (ref 7.5–12.5)
Monocytes Relative: 9 %
Neutro Abs: 5409 {cells}/uL (ref 1500–7800)
Neutrophils Relative %: 60.1 %
Platelets: 267 Thousand/uL (ref 140–400)
RBC: 4.15 Million/uL — ABNORMAL LOW (ref 4.20–5.80)
RDW: 12.3 % (ref 11.0–15.0)
Total Lymphocyte: 27.9 %
WBC: 9 Thousand/uL (ref 3.8–10.8)

## 2024-03-22 LAB — URINALYSIS, ROUTINE W REFLEX MICROSCOPIC
Bacteria, UA: NONE SEEN /HPF
Bilirubin Urine: NEGATIVE
Glucose, UA: NEGATIVE
Hgb urine dipstick: NEGATIVE
Hyaline Cast: NONE SEEN /LPF
Nitrite: NEGATIVE
Protein, ur: NEGATIVE
Specific Gravity, Urine: 1.017 (ref 1.001–1.035)
Squamous Epithelial / HPF: NONE SEEN /HPF
WBC, UA: NONE SEEN /HPF (ref 0–5)
pH: 6 (ref 5.0–8.0)

## 2024-03-22 LAB — LIPID PANEL
Cholesterol: 130 mg/dL
HDL: 63 mg/dL
LDL Cholesterol (Calc): 54 mg/dL
Non-HDL Cholesterol (Calc): 67 mg/dL
Total CHOL/HDL Ratio: 2.1 (calc)
Triglycerides: 57 mg/dL

## 2024-03-22 LAB — COMPREHENSIVE METABOLIC PANEL WITH GFR
AG Ratio: 2.1 (calc) (ref 1.0–2.5)
ALT: 15 U/L (ref 9–46)
AST: 19 U/L (ref 10–35)
Albumin: 4 g/dL (ref 3.6–5.1)
Alkaline phosphatase (APISO): 95 U/L (ref 35–144)
BUN/Creatinine Ratio: 17 (calc) (ref 6–22)
BUN: 11 mg/dL (ref 7–25)
CO2: 28 mmol/L (ref 20–32)
Calcium: 9.2 mg/dL (ref 8.6–10.3)
Chloride: 102 mmol/L (ref 98–110)
Creat: 0.64 mg/dL — ABNORMAL LOW (ref 0.70–1.22)
Globulin: 1.9 g/dL (ref 1.9–3.7)
Glucose, Bld: 85 mg/dL (ref 65–99)
Potassium: 4.4 mmol/L (ref 3.5–5.3)
Sodium: 137 mmol/L (ref 135–146)
Total Bilirubin: 0.7 mg/dL (ref 0.2–1.2)
Total Protein: 5.9 g/dL — ABNORMAL LOW (ref 6.1–8.1)
eGFR: 89 mL/min/1.73m2

## 2024-03-22 LAB — TSH: TSH: 1.38 m[IU]/L (ref 0.40–4.50)

## 2024-03-22 LAB — MICROSCOPIC MESSAGE

## 2024-03-22 NOTE — Progress Notes (Signed)
 "  Subjective:    Patient ID: Eugene [name], male    DOB: 10-14-1931, 89 y.o.   MRN: [id number]  Patient is a very pleasant 89 year old Caucasian gentleman who is here today for a physical exam.  Recently when I saw him I started him on Flomax  for nocturia.  The patient states that this has not helped at all.  He states that he is waking up several times at night to urinate.  He reports very little urine output.  However during the daytime he seems to do fine.  He denies any urinary retention.  He states that he feels like he is emptying his bladder completely.  He denies any chest pain shortness of breath or dyspnea on exertion.  His immunizations are up-to-date.  He has had his flu shot, pneumonia vaccine, shingles vaccine etc.  Due to his age she does not require prostate cancer screening or colonoscopy.  He is due for some lab work.  Patient does complain of generalized fatigue however he attributes this to his age.  Otherwise he is doing well.  He does have some right sided low back pain that seems to be muscular.  Physical therapy was not helpful for this. Past Medical History:  Diagnosis Date   Amputated right leg (HCC)    CAD (coronary artery disease)    a. 09/2017 - acute MI -  emergent cath showing 99% stenosis in midLAD with thrombotic stenosis at the bifurcation of the second diagonal, which was relatively small in diameter. He underwent placement of DES to midLAD which jailed the second diagonal.    Chronic combined systolic and diastolic heart failure (HCC)    Collagenous colitis    Dr. Milana   History of ST elevation myocardial infarction (STEMI)    09/2017: LAD   History of stomach ulcers    Hyperlipidemia LDL goal <70    Ischemic cardiomyopathy    Squamous cell carcinoma of skin 03/27/2014   in situ-left temple (txpbx)   Squamous cell carcinoma of skin 02/24/2018   in situ-left cheek ([id number])   Past Surgical History:  Procedure Laterality Date   BACK SURGERY     CARDIAC  CATHETERIZATION     CORONARY STENT INTERVENTION N/A 10/07/2017   Procedure: CORONARY STENT INTERVENTION;  Surgeon: Darron Deatrice LABOR, MD;  Location: [facility];  Service: Cardiovascular;  Laterality: N/A;   CORONARY/GRAFT ACUTE MI REVASCULARIZATION N/A 10/07/2017   Procedure: Coronary/Graft Acute MI Revascularization;  Surgeon: Darron Deatrice LABOR, MD;  Location: [facility];  Service: Cardiovascular;  Laterality: N/A;   LEFT HEART CATH AND CORONARY ANGIOGRAPHY N/A 10/07/2017   Procedure: LEFT HEART CATH AND CORONARY ANGIOGRAPHY;  Surgeon: Darron Deatrice LABOR, MD;  Location: [facility];  Service: Cardiovascular;  Laterality: N/A;   LEG AMPUTATION     Right Below knee   STOMACH SURGERY     Billroth II gastrojejunostomy   TOTAL KNEE ARTHROPLASTY     Current Outpatient Medications on File Prior to Visit  Medication Sig Dispense Refill   ALPRAZolam  (XANAX ) 0.5 MG tablet Take 1 tablet (0.5 mg total) by mouth at bedtime as needed. for sleep 30 tablet 3   aspirin  81 MG tablet Take 81 mg by mouth daily.     budesonide  (ENTOCORT EC ) 3 MG 24 hr capsule Take 2 capsules (6 mg total) by mouth daily. 180 capsule 3   carvedilol  (COREG ) 3.125 MG tablet Take 1 tablet (3.125 mg total) by mouth 2 (two) times  daily with a meal. 180 tablet 3   cholestyramine  (QUESTRAN ) 4 GM/DOSE powder Take 1 packet (4 g total) by mouth daily as needed (for colitis flares (mix and drink)). 368.76 g 1   dorzolamide  (TRUSOPT ) 2 % ophthalmic solution Place 1 drop into both eyes 2 (two) times daily. 30 mL 3   losartan  (COZAAR ) 25 MG tablet Take 1 tablet (25 mg total) by mouth daily. 90 tablet 3   Multiple Vitamins-Minerals (PRESERVISION AREDS) CAPS Take 1 capsule by mouth 2 (two) times daily. 180 capsule 3   nitroGLYCERIN  (NITROSTAT ) 0.4 MG SL tablet Place 1 tablet (0.4 mg total) under the tongue every 5 (five) minutes as needed for chest pain. 25 tablet 3   Omega-3 Fatty Acids  ([employer] FISH OIL) 1200 MG CAPS Take 1,200  mg by mouth daily with breakfast.     rosuvastatin  (CRESTOR ) 20 MG tablet Take 1 tablet (20 mg total) by mouth daily. 90 tablet 3   tamsulosin  (FLOMAX ) 0.4 MG CAPS capsule Take 1 capsule (0.4 mg total) by mouth daily. 30 capsule 3   predniSONE  (DELTASONE ) 20 MG tablet Take 3 tablets by mouth on days 1 and 2, take 2 tablets by mouth on days 3 and 4, take 1 tablet by mouth on days 5 and 6 (Patient not taking: Reported on 03/22/2024) 12 tablet 0   No current facility-administered medications on file prior to visit.   Allergies  Allergen Reactions   Aleve [Naproxen Sodium] Nausea And Vomiting and Other (See Comments)    No problem with other NSAIDs   Amoxicillin Other (See Comments)    Vertigo Did it involve swelling of the face/tongue/throat, SOB, or low BP? No Did it involve sudden or severe rash/hives, skin peeling, or any reaction on the inside of your mouth or nose? No Did you need to seek medical attention at a hospital or doctor's office? unknown When did it last happen?   yrs ago    dizziness If all above answers are NO, may proceed with cephalosporin use.    Gabapentin  Other (See Comments)    Might be sensitive to this- took 100 mg twice within a couple of hours = Ended up falling from a standing position and had to be taken to the E.D.   Morphine And Codeine Other (See Comments)    Dizziness    Penicillins Diarrhea and Other (See Comments)    Diarrhea Did it involve swelling of the face/tongue/throat, SOB, or low BP? No Did it involve sudden or severe rash/hives, skin peeling, or any reaction on the inside of your mouth or nose? No Did you need to seek medical attention at a hospital or doctor's office? Unknown When did it last happen?    years ago   If all above answers are NO, may proceed with cephalosporin use.    Social History   Socioeconomic History   Marital status: Married    Spouse name: Sport And Exercise Psychologist   Number of children: Not on file   Years of education: 12    Highest education level: High school graduate  Occupational History   Occupation: Retired  Tobacco Use   Smoking status: Former    Current packs/day: 1.00    Average packs/day: 1 pack/day for 8.0 years (8.0 ttl pk-yrs)    Types: Cigarettes   Smokeless tobacco: Never  Vaping Use   Vaping status: Never Used  Substance and Sexual Activity   Alcohol use: Yes    Alcohol/week: 3.0 standard drinks of alcohol  Types: 3 Cans of beer per week    Comment: occasional 3-4 beers weekly   Drug use: No   Sexual activity: Yes    Comment: married  Other Topics Concern   Not on file  Social History Narrative   Not on file   Social Drivers of Health   Tobacco Use: Medium Risk (03/22/2024)   Patient History    Smoking Tobacco Use: Former    Smokeless Tobacco Use: Never    Passive Exposure: Not on file  Financial Resource Strain: Low Risk (12/17/2023)   Overall Financial Resource Strain (CARDIA)    Difficulty of Paying Living Expenses: Not hard at all  Food Insecurity: Unknown (12/17/2023)   Epic    Worried About Programme Researcher, Broadcasting/film/video in the Last Year: Not on file    [employer] of Food in the Last Year: Never true  Transportation Needs: No Transportation Needs (12/17/2023)   Epic    Lack of Transportation (Medical): No    Lack of Transportation (Non-Medical): No  Physical Activity: Insufficiently Active (12/17/2023)   Exercise Vital Sign    Days of Exercise per Week: 2 days    Minutes of Exercise per Session: 30 min  Stress: No Stress Concern Present (12/17/2023)   [employer] of Occupational Health - Occupational Stress Questionnaire    Feeling of Stress: Not at all  Social Connections: Moderately Isolated (12/17/2023)   Social Connection and Isolation Panel    Frequency of Communication with Friends and Family: Once a week    Frequency of Social Gatherings with Friends and Family: More than three times a week    Attends Religious Services: More than 4 times per year    Active Member of  [employer] or Organizations: No    Attends Banker Meetings: Never    Marital Status: Widowed  Intimate Partner Violence: Not At Risk (12/17/2023)   Epic    Fear of Current or Ex-Partner: No    Emotionally Abused: No    Physically Abused: No    Sexually Abused: No  Depression (PHQ2-9): Low Risk (12/17/2023)   Depression (PHQ2-9)    PHQ-2 Score: 4  Alcohol Screen: Low Risk (12/17/2023)   Alcohol Screen    Last Alcohol Screening Score (AUDIT): 3  Housing: Unknown (12/17/2023)   Epic    Unable to Pay for Housing in the Last Year: No    Number of Times Moved in the Last Year: Not on file    Homeless in the Last Year: No  Utilities: Not At Risk (12/17/2023)   Epic    Threatened with loss of utilities: No  Health Literacy: Adequate Health Literacy (12/17/2023)   B1300 Health Literacy    Frequency of need for help with medical instructions: Never   Review of Systems  All other systems reviewed and are negative.      Objective:   Physical Exam Vitals reviewed.  Constitutional:      General: He is not in acute distress.    Appearance: Normal appearance. He is normal weight. He is not ill-appearing, toxic-appearing or diaphoretic.  HENT:     Head: Normocephalic and atraumatic.     Right Ear: Tympanic membrane and ear canal normal.     Left Ear: Tympanic membrane and ear canal normal.     Nose: Nose normal. No congestion or rhinorrhea.     Mouth/Throat:     Pharynx: No oropharyngeal exudate or posterior oropharyngeal erythema.  Eyes:     General: No scleral  icterus.       Right eye: No discharge.        Left eye: No discharge.     Extraocular Movements: Extraocular movements intact.     Conjunctiva/sclera: Conjunctivae normal.     Pupils: Pupils are equal, round, and reactive to light.  Neck:     Vascular: No carotid bruit.  Cardiovascular:     Rate and Rhythm: Normal rate and regular rhythm.     Heart sounds: Murmur heard.     No friction rub. No gallop.  Pulmonary:      Effort: Pulmonary effort is normal. No respiratory distress.     Breath sounds: Normal breath sounds. No stridor. No wheezing, rhonchi or rales.  Abdominal:     General: Abdomen is flat. Bowel sounds are normal. There is no distension.     Palpations: Abdomen is soft.     Tenderness: There is no abdominal tenderness. There is no guarding or rebound.  Musculoskeletal:        General: Deformity present. No swelling, tenderness or signs of injury.     Cervical back: Normal range of motion. No rigidity.     Lumbar back: No spasms, tenderness or bony tenderness. Decreased range of motion. Negative right straight leg raise test and negative left straight leg raise test.     Right hip: No tenderness or bony tenderness. Normal range of motion.     Left hip: No tenderness or bony tenderness. Normal range of motion.     Left lower leg: No edema.  Lymphadenopathy:     Cervical: No cervical adenopathy.  Skin:    Coloration: Skin is not jaundiced or pale.     Findings: No bruising, erythema, lesion or rash.  Neurological:     General: No focal deficit present.     Mental Status: He is alert and oriented to person, place, and time. Mental status is at baseline.     Cranial Nerves: No cranial nerve deficit.     Sensory: No sensory deficit.     Motor: No weakness.     Coordination: Coordination normal.     Gait: Gait normal.     Deep Tendon Reflexes: Reflexes normal.  Psychiatric:        Mood and Affect: Mood normal.        Behavior: Behavior normal.        Thought Content: Thought content normal.        Judgment: Judgment normal.   Right below the knee amputation    Assessment & Plan:  Hyperlipidemia LDL goal <70 - Plan: CBC with Differential/Platelet, Comprehensive metabolic panel with GFR, Lipid panel  Other fatigue - Plan: CBC with Differential/Platelet, Comprehensive metabolic panel with GFR, Lipid panel, TSH, Urinalysis, Routine w reflex microscopic  Coronary artery disease  involving native coronary artery of native heart without angina pectoris  History of right below knee amputation (HCC)  History of myocardial infarction Patient's blood pressure today is well-controlled at 132/64.  His immunizations are up-to-date.  I would like to check his fasting lipid panel to keep his LDL cholesterol well below 70.  Given his fatigue I will check a CBC a CMP a TSH.  Given his back pain and fatigue I will also check a urinalysis.  I suspect that his back pain is likely musculoskeletal.  However if there is any hematuria, we may want to proceed with imaging of the kidneys to evaluate for any blockage or abnormalities.  Patient's immunizations are up-to-date, he has  had his flu shot, shingles vaccine, and pneumonia vaccine.  We discussed RSV.  I believe some of his symptoms may be overactive bladder.  We will try him on Gemtesa 75 mg daily.  He will discontinue Flomax .  If beneficial I will give him a prescription for Gemtesa moving forward. "

## 2024-03-24 ENCOUNTER — Encounter: Payer: Self-pay | Admitting: Cardiovascular Disease

## 2024-03-24 ENCOUNTER — Ambulatory Visit: Payer: Self-pay | Admitting: Family Medicine

## 2024-04-07 ENCOUNTER — Encounter: Payer: Self-pay | Admitting: Family Medicine

## 2024-04-08 ENCOUNTER — Other Ambulatory Visit (HOSPITAL_COMMUNITY): Payer: Self-pay

## 2024-04-08 ENCOUNTER — Other Ambulatory Visit: Payer: Self-pay

## 2024-04-08 ENCOUNTER — Ambulatory Visit: Admitting: Family Medicine

## 2024-04-08 ENCOUNTER — Other Ambulatory Visit (HOSPITAL_BASED_OUTPATIENT_CLINIC_OR_DEPARTMENT_OTHER): Payer: Self-pay

## 2024-04-08 ENCOUNTER — Other Ambulatory Visit: Payer: Self-pay | Admitting: Family Medicine

## 2024-04-08 DIAGNOSIS — M545 Low back pain, unspecified: Secondary | ICD-10-CM

## 2024-04-08 MED ORDER — GEMTESA 75 MG PO TABS
75.0000 mg | ORAL_TABLET | Freq: Every day | ORAL | 1 refills | Status: AC
  Filled 2024-04-08 – 2024-04-13 (×2): qty 90, 90d supply, fill #0

## 2024-04-08 MED ORDER — PREDNISONE 20 MG PO TABS
ORAL_TABLET | ORAL | 0 refills | Status: DC
  Filled 2024-04-08: qty 12, 6d supply, fill #0

## 2024-04-08 MED ORDER — PREDNISONE 20 MG PO TABS: ORAL_TABLET | ORAL | 0 refills | Status: DC

## 2024-04-11 ENCOUNTER — Other Ambulatory Visit: Payer: Self-pay

## 2024-04-12 ENCOUNTER — Ambulatory Visit: Admitting: Podiatry

## 2024-04-12 ENCOUNTER — Other Ambulatory Visit: Payer: Self-pay

## 2024-04-12 ENCOUNTER — Other Ambulatory Visit (HOSPITAL_COMMUNITY): Payer: Self-pay

## 2024-04-13 ENCOUNTER — Other Ambulatory Visit: Payer: Self-pay

## 2024-04-13 ENCOUNTER — Other Ambulatory Visit (HOSPITAL_COMMUNITY): Payer: Self-pay

## 2024-04-18 ENCOUNTER — Ambulatory Visit: Admitting: Podiatry

## 2024-04-19 ENCOUNTER — Encounter: Payer: Self-pay | Admitting: Podiatry

## 2024-04-19 ENCOUNTER — Other Ambulatory Visit: Payer: Self-pay | Admitting: Cardiovascular Disease

## 2024-04-19 ENCOUNTER — Other Ambulatory Visit: Payer: Self-pay | Admitting: Family Medicine

## 2024-04-19 ENCOUNTER — Ambulatory Visit: Admitting: Podiatry

## 2024-04-19 DIAGNOSIS — E785 Hyperlipidemia, unspecified: Secondary | ICD-10-CM

## 2024-04-19 DIAGNOSIS — M25572 Pain in left ankle and joints of left foot: Secondary | ICD-10-CM

## 2024-04-19 MED ORDER — LOSARTAN POTASSIUM 25 MG PO TABS
25.0000 mg | ORAL_TABLET | Freq: Every day | ORAL | 0 refills | Status: AC
  Filled 2024-04-19: qty 90, 90d supply, fill #0

## 2024-04-19 MED ORDER — TRIAMCINOLONE ACETONIDE 40 MG/ML IJ SUSP
40.0000 mg | Freq: Once | INTRAMUSCULAR | Status: AC
  Administered 2024-04-19: 40 mg

## 2024-04-19 MED ORDER — ROSUVASTATIN CALCIUM 20 MG PO TABS
20.0000 mg | ORAL_TABLET | Freq: Every day | ORAL | 3 refills | Status: AC
  Filled 2024-04-19: qty 90, 90d supply, fill #0

## 2024-04-19 MED ORDER — CARVEDILOL 3.125 MG PO TABS
3.1250 mg | ORAL_TABLET | Freq: Two times a day (BID) | ORAL | 0 refills | Status: AC
  Filled 2024-04-19: qty 180, 90d supply, fill #0

## 2024-04-19 NOTE — Progress Notes (Signed)
 Presents today complaining of pain again at the first metatarsal phalangeal joint on the left.  Dr. Tobie injected it about 6 or 8 weeks ago and started to get painful again.  Says the injection helped last time.   Physical exam:  General appearance: Pleasant, and in no acute distress. AOx3.  Vascular: Pedal pulses: DP 2/4 bilaterally, PT 1/4 bilaterally.  Mild to moderate edema lower legs bilaterally. Capillary fill time immediate bilaterally.  Neurological: Grossly intact bilaterally  Dermatologic:   Skin normal temperature bilaterally.  Skin normal color, tone, and texture bilaterally.   Musculoskeletal: Tenderness palpation and range of motion of the first metatarsal phalangeal joint left.  Mild hallux valgus deformity.    Diagnosis: 1.  Arthralgia first metatarsal phalangeal joint left  Plan: -Discussed with him pain of the joint injection done about 6 weeks ago helped a lot.  Will try another injection.  Recommend wearing good stable supportive shoes with a wide toebox.  He only has a pending low from today. -Dispensed OTC foot orthoses bilaterally  - Injected 3cc 2:1 mixture 0.5 cc Marcaine: Triamcinolone  40mg /16ml at first metatarsal phalangeal joint left.    Return regularly scheduled RFC' as already scheduled

## 2024-04-20 ENCOUNTER — Other Ambulatory Visit (HOSPITAL_COMMUNITY): Payer: Self-pay

## 2024-04-21 ENCOUNTER — Other Ambulatory Visit (HOSPITAL_COMMUNITY): Payer: Self-pay

## 2024-06-28 ENCOUNTER — Ambulatory Visit: Admitting: Podiatry

## 2024-12-22 ENCOUNTER — Ambulatory Visit

## 2025-03-23 ENCOUNTER — Encounter: Admitting: Family Medicine
